# Patient Record
Sex: Female | Born: 1940
Health system: Southern US, Community
[De-identification: ages and names within clinical notes are randomized; demographics above are authoritative.]

## PROBLEM LIST (undated history)

## (undated) DIAGNOSIS — E875 Hyperkalemia: Secondary | ICD-10-CM

## (undated) DIAGNOSIS — E785 Hyperlipidemia, unspecified: Secondary | ICD-10-CM

## (undated) DIAGNOSIS — I10 Essential (primary) hypertension: Secondary | ICD-10-CM

## (undated) DIAGNOSIS — M069 Rheumatoid arthritis, unspecified: Secondary | ICD-10-CM

## (undated) DIAGNOSIS — G4700 Insomnia, unspecified: Secondary | ICD-10-CM

## (undated) HISTORY — DX: Rheumatoid arthritis, unspecified: M06.9

## (undated) HISTORY — DX: Insomnia, unspecified: G47.00

## (undated) HISTORY — DX: Hyperlipidemia, unspecified: E78.5

## (undated) HISTORY — PX: HAND TENDON SURGERY: SHX663

## (undated) HISTORY — DX: Essential (primary) hypertension: I10

## (undated) HISTORY — DX: Hyperkalemia: E87.5

---

## 1965-12-05 HISTORY — PX: BREAST CYST EXCISION: SHX579

## 1986-12-05 HISTORY — PX: OVARIAN CYST SURGERY: SHX726

## 2002-06-04 ENCOUNTER — Encounter: Admission: RE | Admit: 2002-06-04 | Discharge: 2002-09-02 | Payer: Self-pay | Admitting: Family Medicine

## 2002-10-28 ENCOUNTER — Ambulatory Visit (HOSPITAL_COMMUNITY): Admission: RE | Admit: 2002-10-28 | Discharge: 2002-10-28 | Payer: Self-pay | Admitting: Family Medicine

## 2005-01-27 ENCOUNTER — Other Ambulatory Visit: Admission: RE | Admit: 2005-01-27 | Discharge: 2005-01-27 | Payer: Self-pay | Admitting: Family Medicine

## 2005-03-24 ENCOUNTER — Ambulatory Visit (HOSPITAL_COMMUNITY): Admission: RE | Admit: 2005-03-24 | Discharge: 2005-03-24 | Payer: Self-pay | Admitting: Gastroenterology

## 2006-01-30 ENCOUNTER — Other Ambulatory Visit: Admission: RE | Admit: 2006-01-30 | Discharge: 2006-01-30 | Payer: Self-pay | Admitting: Family Medicine

## 2006-03-31 ENCOUNTER — Encounter: Admission: RE | Admit: 2006-03-31 | Discharge: 2006-05-19 | Payer: Self-pay | Admitting: Family Medicine

## 2006-04-27 ENCOUNTER — Encounter: Admission: RE | Admit: 2006-04-27 | Discharge: 2006-06-22 | Payer: Self-pay | Admitting: Family Medicine

## 2006-06-16 ENCOUNTER — Encounter: Admission: RE | Admit: 2006-06-16 | Discharge: 2006-06-16 | Payer: Self-pay | Admitting: Family Medicine

## 2007-02-13 ENCOUNTER — Other Ambulatory Visit: Admission: RE | Admit: 2007-02-13 | Discharge: 2007-02-13 | Payer: Self-pay | Admitting: Family Medicine

## 2007-02-16 ENCOUNTER — Ambulatory Visit: Payer: Self-pay | Admitting: Endocrinology

## 2007-02-21 ENCOUNTER — Ambulatory Visit: Payer: Self-pay | Admitting: Endocrinology

## 2007-03-01 ENCOUNTER — Encounter: Admission: RE | Admit: 2007-03-01 | Discharge: 2007-05-30 | Payer: Self-pay | Admitting: Family Medicine

## 2007-03-07 ENCOUNTER — Ambulatory Visit: Payer: Self-pay | Admitting: Endocrinology

## 2007-04-17 ENCOUNTER — Ambulatory Visit: Payer: Self-pay | Admitting: Endocrinology

## 2007-05-18 ENCOUNTER — Ambulatory Visit: Payer: Self-pay | Admitting: Endocrinology

## 2007-06-21 ENCOUNTER — Ambulatory Visit: Payer: Self-pay | Admitting: Endocrinology

## 2007-06-21 LAB — CONVERTED CEMR LAB: Hgb A1c MFr Bld: 6.1 % — ABNORMAL HIGH (ref 4.6–6.0)

## 2007-08-28 ENCOUNTER — Encounter: Admission: RE | Admit: 2007-08-28 | Discharge: 2007-11-14 | Payer: Self-pay | Admitting: Family Medicine

## 2007-09-19 ENCOUNTER — Encounter: Payer: Self-pay | Admitting: *Deleted

## 2007-09-19 DIAGNOSIS — G4700 Insomnia, unspecified: Secondary | ICD-10-CM | POA: Insufficient documentation

## 2007-09-19 DIAGNOSIS — J309 Allergic rhinitis, unspecified: Secondary | ICD-10-CM | POA: Insufficient documentation

## 2007-09-19 DIAGNOSIS — E785 Hyperlipidemia, unspecified: Secondary | ICD-10-CM | POA: Insufficient documentation

## 2007-09-20 ENCOUNTER — Ambulatory Visit: Payer: Self-pay | Admitting: Endocrinology

## 2007-09-20 ENCOUNTER — Encounter: Payer: Self-pay | Admitting: Endocrinology

## 2007-09-20 DIAGNOSIS — E119 Type 2 diabetes mellitus without complications: Secondary | ICD-10-CM | POA: Insufficient documentation

## 2007-09-20 LAB — CONVERTED CEMR LAB: Hgb A1c MFr Bld: 5.9 % (ref 4.6–6.0)

## 2007-10-11 ENCOUNTER — Telehealth (INDEPENDENT_AMBULATORY_CARE_PROVIDER_SITE_OTHER): Payer: Self-pay | Admitting: *Deleted

## 2007-11-08 ENCOUNTER — Telehealth (INDEPENDENT_AMBULATORY_CARE_PROVIDER_SITE_OTHER): Payer: Self-pay | Admitting: *Deleted

## 2007-12-07 ENCOUNTER — Ambulatory Visit: Payer: Self-pay | Admitting: Endocrinology

## 2007-12-08 LAB — CONVERTED CEMR LAB: Hgb A1c MFr Bld: 5.9 % (ref 4.6–6.0)

## 2007-12-31 ENCOUNTER — Encounter: Admission: RE | Admit: 2007-12-31 | Discharge: 2008-01-01 | Payer: Self-pay | Admitting: Family Medicine

## 2008-01-16 ENCOUNTER — Encounter: Payer: Self-pay | Admitting: Endocrinology

## 2008-02-28 ENCOUNTER — Other Ambulatory Visit: Admission: RE | Admit: 2008-02-28 | Discharge: 2008-02-28 | Payer: Self-pay | Admitting: Family Medicine

## 2008-02-28 ENCOUNTER — Encounter: Payer: Self-pay | Admitting: Endocrinology

## 2008-03-11 ENCOUNTER — Ambulatory Visit: Payer: Self-pay | Admitting: Endocrinology

## 2008-03-11 LAB — CONVERTED CEMR LAB: Hgb A1c MFr Bld: 6.2 % — ABNORMAL HIGH (ref 4.6–6.0)

## 2008-07-23 ENCOUNTER — Encounter: Admission: RE | Admit: 2008-07-23 | Discharge: 2008-08-26 | Payer: Self-pay | Admitting: Family Medicine

## 2008-09-09 ENCOUNTER — Ambulatory Visit: Payer: Self-pay | Admitting: Endocrinology

## 2008-09-09 LAB — CONVERTED CEMR LAB
BUN: 13 mg/dL (ref 6–23)
CO2: 34 meq/L — ABNORMAL HIGH (ref 19–32)
Calcium: 9.3 mg/dL (ref 8.4–10.5)
Chloride: 104 meq/L (ref 96–112)
Creatinine, Ser: 0.8 mg/dL (ref 0.4–1.2)
GFR calc Af Amer: 93 mL/min
GFR calc non Af Amer: 77 mL/min
Glucose, Bld: 98 mg/dL (ref 70–99)
Hgb A1c MFr Bld: 6 % (ref 4.6–6.0)
Potassium: 3.5 meq/L (ref 3.5–5.1)
Sodium: 146 meq/L — ABNORMAL HIGH (ref 135–145)

## 2008-12-15 ENCOUNTER — Ambulatory Visit: Payer: Self-pay | Admitting: Endocrinology

## 2008-12-15 LAB — CONVERTED CEMR LAB
Hgb A1c MFr Bld: 6.1 % — ABNORMAL HIGH (ref 4.6–6.0)
TSH: 1.76 microintl units/mL (ref 0.35–5.50)

## 2009-02-02 LAB — CONVERTED CEMR LAB: Pap Smear: NORMAL

## 2009-03-02 ENCOUNTER — Other Ambulatory Visit: Admission: RE | Admit: 2009-03-02 | Discharge: 2009-03-02 | Payer: Self-pay | Admitting: Family Medicine

## 2009-04-22 ENCOUNTER — Encounter: Payer: Self-pay | Admitting: Endocrinology

## 2009-05-26 ENCOUNTER — Encounter: Payer: Self-pay | Admitting: Endocrinology

## 2009-10-28 ENCOUNTER — Ambulatory Visit: Payer: Self-pay | Admitting: Endocrinology

## 2009-10-28 DIAGNOSIS — G5600 Carpal tunnel syndrome, unspecified upper limb: Secondary | ICD-10-CM | POA: Insufficient documentation

## 2009-10-28 DIAGNOSIS — R209 Unspecified disturbances of skin sensation: Secondary | ICD-10-CM | POA: Insufficient documentation

## 2009-10-28 LAB — CONVERTED CEMR LAB
Creatinine,U: 85.6 mg/dL
Folate: 13.5 ng/mL
Hgb A1c MFr Bld: 6.1 % (ref 4.6–6.5)
Microalb Creat Ratio: 2.3 mg/g (ref 0.0–30.0)
Microalb, Ur: 0.2 mg/dL (ref 0.0–1.9)
Vitamin B-12: >1500 pg/mL — ABNORMAL HIGH (ref 211–911)

## 2009-11-03 ENCOUNTER — Telehealth: Payer: Self-pay | Admitting: Endocrinology

## 2010-04-30 ENCOUNTER — Encounter: Payer: Self-pay | Admitting: Endocrinology

## 2010-06-10 ENCOUNTER — Ambulatory Visit: Payer: Self-pay | Admitting: Endocrinology

## 2010-06-24 ENCOUNTER — Ambulatory Visit: Payer: Self-pay | Admitting: Endocrinology

## 2010-06-24 LAB — CONVERTED CEMR LAB
Creatinine,U: 92.3 mg/dL
Hgb A1c MFr Bld: 6 % (ref 4.6–6.5)
Microalb Creat Ratio: 1.2 mg/g (ref 0.0–30.0)
Microalb, Ur: 1.1 mg/dL (ref 0.0–1.9)

## 2010-08-27 ENCOUNTER — Ambulatory Visit: Payer: Self-pay | Admitting: Endocrinology

## 2010-08-27 DIAGNOSIS — R609 Edema, unspecified: Secondary | ICD-10-CM | POA: Insufficient documentation

## 2010-12-30 ENCOUNTER — Other Ambulatory Visit: Payer: Self-pay | Admitting: Endocrinology

## 2010-12-30 ENCOUNTER — Ambulatory Visit
Admission: RE | Admit: 2010-12-30 | Discharge: 2010-12-30 | Payer: Self-pay | Source: Home / Self Care | Attending: Endocrinology | Admitting: Endocrinology

## 2010-12-30 LAB — HEMOGLOBIN A1C: Hgb A1c MFr Bld: 6.4 % (ref 4.6–6.5)

## 2011-01-04 ENCOUNTER — Ambulatory Visit: Payer: Self-pay | Admitting: Cardiovascular Disease

## 2011-01-04 NOTE — Assessment & Plan Note (Signed)
    Vital Signs:  Patient Profile:   70 Years Old Female Weight:      168 pounds Temp:     97 degrees F Pulse rate:   65 / minute BP sitting:   133 / 86                 Visit Type:  Follow-up Visit PCP:  Theresia Lo   History of Present Illness: patient states she is now on just the metformin and Actos.  Her glucoses have been well controlled to about 100, or sometimes lower she has a month or two of blurry vision.   Review of Systems       denies hypoglycemia   Vital Signs:  Patient Profile:   70 Years Old Female Weight:      168 pounds Temp:     97 degrees F Pulse rate:   65 / minute BP sitting:   133 / 86    Past Medical History:    Allergic rhinitis    Hyperlipidemia    Hypertension    Hx oh multiple percieved drug intolerence    Diabetes mellitus, type II     Review of Systems       denies hypoglycemia   Physical Exam  General:     healthy appearing.   Extremities:     no edema    Impression & Recommendations:  Problem # 1:  DIABETES MELLITUS, TYPE II (ICD-250.00) HgbA1C: 6.1 (06/21/2007) a1c=5.9 (10/16/8) well-controlled   Problem # 2:  blurry vision   Patient Instructions: 1)  same medications 2)  patient is advised to go back to her ophthalmologist for evaluation of her blurry vision 3)  Please schedule a follow-up appointment in 6 months.    ]

## 2011-01-04 NOTE — Letter (Signed)
Summary: Dr Davina Poke [facility]  Dr Davina Poke [facility]   Imported By: Lester [place] 05/12/2010 07:49:28  _____________________________________________________________________  External Attachment:    Type:   Image     Comment:   External Document

## 2011-01-04 NOTE — Assessment & Plan Note (Signed)
Summary: 3 mos f/u $50/cd   Vital Signs:  Patient Profile:   70 Years Old Female Weight:      159.6 pounds O2 Sat:      97 % O2 treatment:    Room Air Temp:     97.8 degrees F oral Pulse rate:   75 / minute BP sitting:   140 / 72  (right arm) Cuff size:   regular  Pt. in pain?   no  Vitals Entered By: Orlan Leavens (December 15, 2008 10:08 AM)                  PCP:  Theresia Lo  Chief Complaint:  3 MONTH FOLLOW-UP.  History of Present Illness: pt says she feels well, and she takes 2 dm meds as rx'ed. pt c/o dry skin recently.    Prior Medications Reviewed Using: Patient Recall  Prior Medication List:  ACTOS 45 MG TABS (PIOGLITAZONE HCL) Take 1 tablet by mouth once a day AMITRIPTYLINE HCL 10 MG TABS (AMITRIPTYLINE HCL) Take 1 tablet by mouth at bedtime CLONIDINE HCL 0.3 MG TABS (CLONIDINE HCL) Take 1 tablet by mouth twice a day DYAZIDE 37.5-25 MG CAPS (TRIAMTERENE-HCTZ) Take 1 capsule by mouth once a day METFORMIN HCL 1000 MG TABS (METFORMIN HCL) Take 1 tablet by mouth twice a day POTASSIUM CHLORIDE CRYS CR 20 MEQ TBCR (POTASSIUM CHLORIDE CRYS CR)  ZETIA 10 MG TABS (EZETIMIBE) 1 tablet by mouth once a day ZYRTEC HIVES RELIEF 10 MG TABS (CETIRIZINE HCL) 1 tablet by mouth at bedtime AVAPRO 150 MG  TABS (IRBESARTAN) take 1 by mouth qd   Updated Prior Medication List: ACTOS 45 MG TABS (PIOGLITAZONE HCL) Take 1 tablet by mouth once a day AMITRIPTYLINE HCL 10 MG TABS (AMITRIPTYLINE HCL) Take 1 tablet by mouth at bedtime CLONIDINE HCL 0.3 MG TABS (CLONIDINE HCL) Take 1 tablet by mouth twice a day DYAZIDE 37.5-25 MG CAPS (TRIAMTERENE-HCTZ) Take 1 capsule by mouth once a day METFORMIN HCL 1000 MG TABS (METFORMIN HCL) Take 1 tablet by mouth twice a day POTASSIUM CHLORIDE CRYS CR 20 MEQ TBCR (POTASSIUM CHLORIDE CRYS CR)  ZETIA 10 MG TABS (EZETIMIBE) 1 tablet by mouth once a day ZYRTEC HIVES RELIEF 10 MG TABS (CETIRIZINE HCL) 1 tablet by mouth at bedtime AVAPRO 150 MG  TABS  (IRBESARTAN) take 1 by mouth qd  Current Allergies (reviewed today): ! JANUVIA (SITAGLIPTIN PHOSPHATE) LIPITOR (ATORVASTATIN CALCIUM) MORPHINE SULFATE (MORPHINE SULFATE) TYLENOL (ACETAMINOPHEN) ALEVE (NAPROXEN SODIUM) ASPIRIN (ASPIRIN)  Past Medical History:    Reviewed history from 09/09/2008 and no changes required:       Allergic rhinitis       Hyperlipidemia       Hypertension       Hx of multiple percieved drug intolerence       Diabetes mellitus, type II     Review of Systems       pt has lost a few lbs, due to her efforts   Physical Exam  General:     well developed, well nourished, in no acute distress Psych:     alert and cooperative; normal mood and affect; normal attention span and concentration Additional Exam:     A1C%                 [H]  6.1 %                       4.6-6.0 FastTSH  1.76 uIU/mL     Impression & Recommendations:  Problem # 1:  DIABETES MELLITUS, TYPE II (ICD-250.00) well-controlled    Patient Instructions: 1)  same meds 2)  ret 6 mos

## 2011-01-04 NOTE — Assessment & Plan Note (Signed)
Summary: f/u appt per pt/cd   Vital Signs:  Patient profile:   70 year old female Height:      64 inches Weight:      172 pounds BMI:     29.63 O2 Sat:      97 % on Room air Temp:     97.6 degrees F oral Pulse rate:   71 / minute BP sitting:   120 / 74  (left arm) Cuff size:   regular  O2 Flow:  Room air CC: Endo f/u DBD   Primary Provider:  [name]  CC:  Endo f/u DBD.  History of Present Illness: pt states 1 year of mild numbness of both hands.  no associated pain. no cbg record, but states cbg's have increased to mid-100's.  it is below 100 in the afternoon.    Allergies: 1)  ! Januvia (Sitagliptin Phosphate) 2)  Lipitor (Atorvastatin Calcium) 3)  Morphine Sulfate (Morphine Sulfate) 4)  Tylenol (Acetaminophen) 5)  Aleve (Naproxen Sodium) 6)  Aspirin (Aspirin)  Past History:  Past Medical History: Last updated: 09/09/2008 Allergic rhinitis Hyperlipidemia Hypertension Hx of multiple percieved drug intolerence Diabetes mellitus, type II  Past Surgical History: right cataract 2010  Review of Systems       The patient complains of weight gain.  The patient denies chest pain.    Physical Exam  General:  normal appearance.   Neck:  Supple without thyroid enlargement or tenderness. No cervical lymphadenopathy or neck masses Extremities:  hands are normal Neurologic:  sensation is intact to touch on the hands Additional Exam:  Hemoglobin A1C            6.1 %   Impression & Recommendations:  Problem # 1:  DIABETES MELLITUS, TYPE II (ICD-250.00) well-controlled  Problem # 2:  NUMBNESS (ICD-782.0) prob due to #3  Problem # 3:  CARPAL TUNNEL SYNDROME, BILATERAL (ICD-354.0) Assessment: New  Medications Added to Medication List This Visit: 1)  Bilateral Wrist Splints  .... As dir  Other Orders: TLB-B12 + Folate Pnl ([id number]-B12/FOL) TLB-A1C / Hgb A1C (Glycohemoglobin) (83036-A1C) TLB-Microalbumin/Creat Ratio, Urine (82043-MALB) Est. Patient Level  IV (81191)  Patient Instructions: 1)  cc dr Elease Hashimoto 2)  tests are being ordered for you today.  a few days after the test(s), please call [phone] to hear your test results. 3)  i gave prescription for wrist splints 4)  return 6 mos 5)  (update: i left message on phone-tree:  rx as we discussed.  same medications) Prescriptions: BILATERAL WRIST SPLINTS as dir  #1 pair x 0   Entered and Authorized by:   Minus Breeding MD   Signed by:   Minus Breeding MD on 10/28/2009   Method used:   Print then Give to Patient   RxID:   [id number]    Immunization History:  Tetanus/Td Immunization History:    Tetanus/Td:  td (12/06/2003)  Not Administered:    Pneumonia Vaccine not given due to: declined    Influenza Vaccine not given due to: declined

## 2011-01-04 NOTE — Assessment & Plan Note (Signed)
Summary: swollen legs/offered sda,req this appt/cd   Vital Signs:  Patient profile:   70 year old female Height:      64 inches (162.56 cm) Weight:      179.50 pounds (81.59 kg) BMI:     30.92 O2 Sat:      98 % on Room air Temp:     97.6 degrees F (36.44 degrees C) oral Pulse rate:   68 / minute BP sitting:   108 / 64  (left arm) Cuff size:   large  Vitals Entered By: Brenton Grills MA (August 27, 2010 9:20 AM)  O2 Flow:  Room air CC: swollen legs/aj Is Patient Diabetic? Yes   Primary Provider:  l miller  CC:  swollen legs/aj.  History of Present Illness: pt states 10 days of moderate swelling of the legs, and slight assoc pain.   Current Medications (verified): 1)  Actos 45 Mg Tabs (Pioglitazone Hcl) .... Take 1 Tablet By Mouth Once A Day 2)  Amitriptyline Hcl 10 Mg Tabs (Amitriptyline Hcl) .... Take 1 Tablet By Mouth At Bedtime 3)  Clonidine Hcl 0.3 Mg Tabs (Clonidine Hcl) .... Take 1 Tablet By Mouth Twice A Day 4)  Dyazide 37.5-25 Mg Caps (Triamterene-Hctz) .... Take 1 Capsule By Mouth Once A Day 5)  Metformin Hcl 1000 Mg Tabs (Metformin Hcl) .... Take 1 Tablet By Mouth Twice A Day 6)  Potassium Chloride Crys Cr 20 Meq Tbcr (Potassium Chloride Crys Cr) 7)  Zetia 10 Mg Tabs (Ezetimibe) .[name].. 1 Tablet By Mouth Once A Day 8)  Zyrtec Hives Relief 10 Mg Tabs (Cetirizine Hcl) .[name].. 1 Tablet By Mouth At Bedtime 9)  Avapro 150 Mg  Tabs (Irbesartan) .... Take 1 By Mouth Qd 10)  Bilateral Wrist Splints .... As Dir 11)  Fexofenadine Hcl 180 Mg Tabs (Fexofenadine Hcl) .[name].. 1 Tablet Daily 12)  Meloxicam 15 Mg Tabs (Meloxicam) .[name].. 1 Tablet Once Daily  Allergies (verified): 1)  ! Januvia (Sitagliptin Phosphate) 2)  Lipitor (Atorvastatin Calcium) 3)  Morphine Sulfate (Morphine Sulfate) 4)  Tylenol (Acetaminophen) 5)  Aleve (Naproxen Sodium) 6)  Aspirin (Aspirin)  Past History:  Past Medical History: Last updated: 09/09/2008 Allergic  rhinitis Hyperlipidemia Hypertension Hx of multiple percieved drug intolerence Diabetes mellitus, type II  Review of Systems  The patient denies chest pain and dyspnea on exertion.    Physical Exam  General:  obese.  no distress  Extremities:  no deformity.  no ulcer on the feet.  feet are of normal color and temp.   there is a "corn" at the extensor surface of the right 2nd toe. 1+ right pedal edema and 1+ left pedal edema.     Impression & Recommendations:  Problem # 1:  EDEMA (ICD-782.3) Assessment New prob due to actos  Medications Added to Medication List This Visit: 1)  Actos 15 Mg Tabs (Pioglitazone hcl) .[name].. 1 once daily  Other Orders: Est. Patient Level III (95284)  Patient Instructions: 1)  stop actos 2)  in 1 month, resume at 15 mg once daily (or, to use-up your current 45 mg, take 1 every 3 days). 3)  Please schedule a follow-up appointment in 4 months.

## 2011-01-04 NOTE — Assessment & Plan Note (Signed)
Summary: follow up-lb   Vital Signs:  Patient profile:   70 year old female Height:      64 inches (162.56 cm) Weight:      171.25 pounds (77.84 kg) BMI:     29.50 O2 Sat:      97 % on Room air Temp:     97.7 degrees F (36.50 degrees C) oral Pulse rate:   63 / minute BP sitting:   142 / 92  (left arm) Cuff size:   regular  Vitals Entered By: Brenton Grills MA (June 24, 2010 10:40 AM)  O2 Flow:  Room air CC: F/U appt/aj Is Patient Diabetic? Yes Comments Pt's Ob/Gyn is Valorie Roosevelt at [place] and her mammograms are done at [facility]   Primary Provider:  l miller  CC:  F/U appt/aj.  History of Present Illness: pt states she feels well in general, except for right hand pain, for which she sees orthopedics.  no cbg record, but states cbg's are well-controlled.     Current Medications (verified): 1)  Actos 45 Mg Tabs (Pioglitazone Hcl) .... Take 1 Tablet By Mouth Once A Day 2)  Amitriptyline Hcl 10 Mg Tabs (Amitriptyline Hcl) .... Take 1 Tablet By Mouth At Bedtime 3)  Clonidine Hcl 0.3 Mg Tabs (Clonidine Hcl) .... Take 1 Tablet By Mouth Twice A Day 4)  Dyazide 37.5-25 Mg Caps (Triamterene-Hctz) .... Take 1 Capsule By Mouth Once A Day 5)  Metformin Hcl 1000 Mg Tabs (Metformin Hcl) .... Take 1 Tablet By Mouth Twice A Day 6)  Potassium Chloride Crys Cr 20 Meq Tbcr (Potassium Chloride Crys Cr) 7)  Zetia 10 Mg Tabs (Ezetimibe) .[name].. 1 Tablet By Mouth Once A Day 8)  Zyrtec Hives Relief 10 Mg Tabs (Cetirizine Hcl) .[name].. 1 Tablet By Mouth At Bedtime 9)  Avapro 150 Mg  Tabs (Irbesartan) .... Take 1 By Mouth Qd 10)  Bilateral Wrist Splints .... As Dir 11)  Fexofenadine Hcl 180 Mg Tabs (Fexofenadine Hcl) .[name].. 1 Tablet Daily 12)  Meloxicam 15 Mg Tabs (Meloxicam) .[name].. 1 Tablet Once Daily  Allergies (verified): 1)  ! Januvia (Sitagliptin Phosphate) 2)  Lipitor (Atorvastatin Calcium) 3)  Morphine Sulfate (Morphine Sulfate) 4)  Tylenol (Acetaminophen) 5)  Aleve (Naproxen Sodium) 6)  Aspirin  (Aspirin)  Past History:  Past Medical History: Last updated: 09/09/2008 Allergic rhinitis Hyperlipidemia Hypertension Hx of multiple percieved drug intolerence Diabetes mellitus, type II  Review of Systems       The patient complains of weight gain.    Physical Exam  General:  obese.  no distress  Pulses:  dorsalis pedis intact bilat.   Extremities:  no deformity.  no ulcer on the feet.  feet are of normal color and temp.  no edema there is a "corn" at the extensor surface of the right 2nd toe. Neurologic:  sensation is intact to touch on the feet  Additional Exam:  Hemoglobin A1C            6.0 %     Impression & Recommendations:  Problem # 1:  DIABETES MELLITUS, TYPE II (ICD-250.00) well-controlled  Medications Added to Medication List This Visit: 1)  Fexofenadine Hcl 180 Mg Tabs (Fexofenadine hcl) .[name].. 1 tablet daily 2)  Meloxicam 15 Mg Tabs (Meloxicam) .[name].. 1 tablet once daily  Other Orders: TLB-A1C / Hgb A1C (Glycohemoglobin) (83036-A1C) TLB-Microalbumin/Creat Ratio, Urine (82043-MALB) Est. Patient Level III (84132)  Patient Instructions: 1)  blood tests are being ordered for you today.  please call  [phone] to hear your test results. 2)  pending the test results, please continue the same medications for now. 3)  Please schedule a follow-up appointment in 1 year. 4)  good diet and exercise habits significanly improve the control of your diabetes.  please let me know if you wish to be referred to a dietician.  high blood sugar is very risky to your health.  you should see an eye doctor every year. 5)  controlling your blood pressure and cholesterol drastically reduces the damage diabetes does to your body.  this also applies to quitting smoking.  please discuss these with your doctor.  you should take an aspirin every day, unless you have been advised by a doctor not to. 6)  (update: i left message on phone-tree:  rx as we discussed) Prescriptions: METFORMIN HCL  1000 MG TABS (METFORMIN HCL) Take 1 tablet by mouth twice a day  #60 x 11   Entered and Authorized by:   Minus Breeding MD   Signed by:   Minus Breeding MD on 06/24/2010   Method used:   Electronically to        [facility] # [phone]* (retail)       [address] [place], [place]  [address]       Ph: [phone]       Fax: [phone]   RxID:   [id number] ACTOS 45 MG TABS (PIOGLITAZONE HCL) Take 1 tablet by mouth once a day  #30 x 11   Entered and Authorized by:   Minus Breeding MD   Signed by:   Minus Breeding MD on 06/24/2010   Method used:   Electronically to        [facility] # [phone]* (retail)       [address] [place], [place]  [address]       Ph: [phone]       Fax: [phone]   RxID:   [id number]    Preventive Care Screening  Mammogram:    Date:  03/30/2010    Results:  normal   Pap Smear:    Date:  02/02/2009    Results:  normal   Bone Density:    Date:  12/05/2002    Results:  normal std dev  Colonoscopy:    Date:  12/05/2002    Results:  normal

## 2011-01-04 NOTE — Letter (Signed)
Summary: [facility]-Dr[facility] London Sheer  [facility]   Imported By: Maryln Gottron 05/07/2009 11:01:15  _____________________________________________________________________  External Attachment:    Type:   Image     Comment:   External Document

## 2011-01-04 NOTE — Assessment & Plan Note (Signed)
Summary: 6 MO ROA/NML   Vital Signs:  Patient Profile:   70 Years Old Female Weight:      160.4 pounds Temp:     97.8 degrees F oral Pulse rate:   60 / minute BP sitting:   142 / 88  (right arm) Cuff size:   regular  Pt. in pain?   no  Vitals Entered By: Orlan Leavens (September 09, 2008 10:18 AM)                  PCP:  Theresia Lo  Chief Complaint:  6 month follow-up.  History of Present Illness: pt says cbg's in am have increased to mid-100's. she feels well.    Current Allergies: ! JANUVIA (SITAGLIPTIN PHOSPHATE) LIPITOR (ATORVASTATIN CALCIUM) MORPHINE SULFATE (MORPHINE SULFATE) TYLENOL (ACETAMINOPHEN) ALEVE (NAPROXEN SODIUM) ASPIRIN (ASPIRIN)  Past Medical History:    Reviewed history from 09/20/2007 and no changes required:       Allergic rhinitis       Hyperlipidemia       Hypertension       Hx of multiple percieved drug intolerence       Diabetes mellitus, type II     Review of Systems  The patient denies weight loss and weight gain.     Physical Exam  General:     well developed, well nourished, in no acute distress Pulses:     dorsalis pedis intact bilat.   Extremities:     no deformity.  no ulcer on the feet.  feet are of normal color and temp.  no edema there are large bilat bunions Neurologic:     sensation is intact to touch on the feet  Additional Exam:     SODIUM               [H]  146 mEq/L                   135-145   POTASSIUM                 3.5 mEq/L                   3.5-5.1   CHLORIDE                  104 mEq/L                   96-112   CARBON DIOXIDE       [H]  34 mEq/L                    19-32   GLUCOSE                   98 mg/dL                    16-10   BUN                       13 mg/dL                    9-60   CREATININE                0.8 mg/dL                   4.5-4.0   CALCIUM  9.3 mg/dL                   1.6-10.9   A1C%                      6.0 %    Impression & Recommendations:  Problem # 1:   DIABETES MELLITUS, TYPE II (ICD-250.00) well-controlled   Patient Instructions: 1)  same rx for dm 2)  ret 3 mos   ]

## 2011-01-04 NOTE — Progress Notes (Signed)
  Phone Note Call from Patient Call back at Home Phone [phone]   Caller: Patient/[phone] Call For: dr Teresita Madura 90  Summary of Call: since been off the amaryl having high reading . per dr Everardo All took her off the amaryl  7-18- 08 what to get back on the med . requesting a written rx for 90 days  Initial call taken by: Shelbie Proctor,  November 08, 2007 4:07 PM  Follow-up for Phone Call        need chart Follow-up by: Corwin Levins MD,  November 08, 2007 5:42 PM  Additional Follow-up for Phone Call Additional follow up Details #1::        not advisable with this amount of information - last A!1c was near normal july 2008 - suggest ROV with dr Everardo All to review Additional Follow-up by: Corwin Levins MD,  November 09, 2007 1:47 PM    Additional Follow-up for Phone Call Additional follow up Details #2::    spoke with pt made aware will make appt with dr Everardo All  Follow-up by: Shelbie Proctor,  November 09, 2007 1:55 PM

## 2011-01-04 NOTE — Assessment & Plan Note (Signed)
Summary: PER 11/08/07 PHONE NOTE-DATE PER PT-STC   Vital Signs:  Patient Profile:   70 Years Old Female Weight:      165.2 pounds Temp:     97.1 degrees F oral Pulse rate:   74 / minute BP sitting:   133 / 80  (right arm) Cuff size:   regular  Vitals Entered By: Orlan Leavens (December 07, 2007 8:07 AM)                 Visit Type:  Follow-up Visit PCP:  Theresia Lo  Chief Complaint:  dm.  History of Present Illness: patient states her morning glucoses, before breakfast, had increased to the mid 100s.  she feels well in general.  she thinks her control has deteriorated.  Current Allergies: LIPITOR (ATORVASTATIN CALCIUM) MORPHINE SULFATE (MORPHINE SULFATE) TYLENOL (ACETAMINOPHEN) ALEVE (NAPROXEN SODIUM) ASPIRIN (ASPIRIN)  Past Medical History:    Reviewed history from 09/20/2007 and no changes required:       Allergic rhinitis       Hyperlipidemia       Hypertension       Hx oh multiple percieved drug intolerence       Diabetes mellitus, type II     Review of Systems  The patient denies unusual weight change.     Physical Exam  General:     well developed, well nourished, in no acute distress Pulses:     dorsalis pedis intact bilat.   Extremities:     no clubbing, cyanosis, edema, or deformity noted with normal full range of motion of all joints.  she has onychomycosis and few callouses Neurologic:     sensation is intact to touch on the feet Additional Exam:     a1c=5.9    Impression & Recommendations:  Problem # 1:  DIABETES MELLITUS, TYPE II (ICD-250.00) well-controlled The following medications were removed from the medication list:    Amaryl 1 Mg Tabs (Glimepiride) .[name]... Take 1 tablet by mouth every morning    Glimepiride 4 Mg Tabs (Glimepiride) .[name]... Take 2 tablet by mouth once a day    Januvia 100 Mg Tabs (Sitagliptin phosphate) .[name]... Take 1 tablet by mouth once a day  Her updated medication list for this problem includes:    Actos 45 Mg Tabs  (Pioglitazone hcl) .[name]... Take 1 tablet by mouth once a day    Glucophage Xr 500 Mg Tb24 (Metformin hcl) .[name]... Take 4 tablet by mouth every morning    Metformin Hcl 1000 Mg Tabs (Metformin hcl) .[name]... Take 1 tablet by mouth twice a day  Orders: TLB-A1C / Hgb A1C (Glycohemoglobin) (83036-A1C) Est. Patient Level III (25956)   Medications Added to Medication List This Visit: 1)  Avapro 150 Mg Tabs (Irbesartan) .... Take 1 by mouth qd   Patient Instructions: 1)  same actos and metformin 2)  Please schedule a follow-up appointment in 6 months.    ]

## 2011-01-04 NOTE — Progress Notes (Signed)
Summary: lab result  Phone Note Call from Patient   Caller: Patient [phone] Summary of Call: pt called to get results of lab work and to request copy of labs be sent to Dr. Harvie Bridge office.  Initial call taken by: Margaret Pyle, CMA,  November 03, 2009 3:20 PM  Follow-up for Phone Call        i left message on phone-tree and faxed copy to dr nahser Follow-up by: Minus Breeding MD,  November 03, 2009 3:41 PM  Additional Follow-up for Phone Call Additional follow up Details #1::        pt informed Additional Follow-up by: Margaret Pyle, CMA,  November 03, 2009 3:45 PM     Appended Document: lab result pt called stating she had appt with Dr. Elease Hashimoto today. Doctor has copy od OV but not of last labs. I manually faxed labs and confirm receipt with Verlon Au at [facility].  Appended Document: lab result pt called again stating that Dr. Sallee Provencal office did not recevie fax. pt gave alt fax number [phone] to which I re-sent copy of pt labs.

## 2011-01-04 NOTE — Assessment & Plan Note (Signed)
Summary: 6 MTH FU-STC   Vital Signs:  Patient Profile:   70 Years Old Female Weight:      158.8 pounds Temp:     97.9 degrees F oral Pulse rate:   59 / minute BP sitting:   116 / 75  (right arm) Cuff size:   regular  Vitals Entered By: Orlan Leavens (March 11, 2008 10:27 AM)                 Visit Type:  Follow-up Visit PCP:  Theresia Lo  Chief Complaint:  dm.  History of Present Illness: pt states cbg's range 78-115.  feels well in general, except for pain left great toe.     Current Allergies: LIPITOR (ATORVASTATIN CALCIUM) MORPHINE SULFATE (MORPHINE SULFATE) TYLENOL (ACETAMINOPHEN) ALEVE (NAPROXEN SODIUM) ASPIRIN (ASPIRIN)  Past Medical History:    Reviewed history from 09/20/2007 and no changes required:       Allergic rhinitis       Hyperlipidemia       Hypertension       Hx oh multiple percieved drug intolerence       Diabetes mellitus, type II     Review of Systems  The patient denies unusual weight change.     Physical Exam  General:     normal appearance.   Extremities:     no edema left toe normal except for heavy callous Additional Exam:     a1c=6.2    Impression & Recommendations:  Problem # 1:  DIABETES MELLITUS, TYPE II (ICD-250.00)  Her updated medication list for this problem includes:    Actos 45 Mg Tabs (Pioglitazone hcl) .[name]... Take 1 tablet by mouth once a day    Glucophage Xr 500 Mg Tb24 (Metformin hcl) .[name]... Take 4 tablet by mouth every morning    Metformin Hcl 1000 Mg Tabs (Metformin hcl) .[name]... Take 1 tablet by mouth twice a day  Orders: TLB-A1C / Hgb A1C (Glycohemoglobin) (83036-A1C) Est. Patient Level III (16109)   Problem # 2:  foot pain   Patient Instructions: 1)  i offered ref podiatry--will consider 2)  same meds 3)  ret 6 mos 4)  cc dr Theresia Lo    ]  Appended Document: 6 MTH FU-STC FAXED NOTES TO DR Theresia Lo @ [phone]/LMB

## 2011-01-04 NOTE — Progress Notes (Signed)
Summary: question about med  Medications Added ACTOS 45 MG TABS (PIOGLITAZONE HCL) Take 1 tablet by mouth once a day AMARYL 1 MG TABS (GLIMEPIRIDE) Take 1 tablet by mouth every morning AMITRIPTYLINE HCL 10 MG TABS (AMITRIPTYLINE HCL) Take 1 tablet by mouth at bedtime CLONIDINE HCL 0.3 MG TABS (CLONIDINE HCL) Take 1 tablet by mouth twice a day DYAZIDE 37.5-25 MG CAPS (TRIAMTERENE-HCTZ) Take 1 capsule by mouth once a day GLIMEPIRIDE 4 MG TABS (GLIMEPIRIDE) Take 2 tablet by mouth once a day GLUCOPHAGE XR 500 MG TB24 (METFORMIN HCL) Take 4 tablet by mouth every morning JANUVIA 100 MG TABS (SITAGLIPTIN PHOSPHATE) Take 1 tablet by mouth once a day LISINOPRIL 20 MG TABS (LISINOPRIL) 1/2 tablet by mouth once a day METFORMIN HCL 1000 MG TABS (METFORMIN HCL) Take 1 tablet by mouth twice a day POTASSIUM CHLORIDE CRYS CR 20 MEQ TBCR (POTASSIUM CHLORIDE CRYS CR)  ZETIA 10 MG TABS (EZETIMIBE) 1 tablet by mouth once a day ZYRTEC HIVES RELIEF 10 MG TABS (CETIRIZINE HCL) 1 tablet by mouth at bedtime      Allergies Added: LIPITOR (ATORVASTATIN CALCIUM) MORPHINE SULFATE (MORPHINE SULFATE) TYLENOL (ACETAMINOPHEN) ALEVE (NAPROXEN SODIUM) ASPIRIN (ASPIRIN) Phone Note Call from Patient Call back at Home Phone [phone] Call back at [phone]/cell   Caller: Patient Call For: dr ellision Summary of Call: was told by dr  Jacquenette Shone to stop the [place] is this what dr Everardo All want her to do this.Patient's chart has been requested.  Initial call taken by: Shelbie Proctor,  October 11, 2007 3:49 PM  Follow-up for Phone Call        stay off januvia continue actos and metformin Follow-up by: Minus Breeding MD,  October 11, 2007 5:09 PM  Additional Follow-up for Phone Call Additional follow up Details #1::        Phone Call Completed pt made aware spoke with pt requarding dr Everardo All msg  Additional Follow-up by: Shelbie Proctor,  October 12, 2007 9:56 AM   New Allergies: LIPITOR (ATORVASTATIN  CALCIUM) MORPHINE SULFATE (MORPHINE SULFATE) TYLENOL (ACETAMINOPHEN) ALEVE (NAPROXEN SODIUM) ASPIRIN (ASPIRIN) New/Updated Medications: ACTOS 45 MG TABS (PIOGLITAZONE HCL) Take 1 tablet by mouth once a day AMARYL 1 MG TABS (GLIMEPIRIDE) Take 1 tablet by mouth every morning AMITRIPTYLINE HCL 10 MG TABS (AMITRIPTYLINE HCL) Take 1 tablet by mouth at bedtime CLONIDINE HCL 0.3 MG TABS (CLONIDINE HCL) Take 1 tablet by mouth twice a day DYAZIDE 37.5-25 MG CAPS (TRIAMTERENE-HCTZ) Take 1 capsule by mouth once a day GLIMEPIRIDE 4 MG TABS (GLIMEPIRIDE) Take 2 tablet by mouth once a day GLUCOPHAGE XR 500 MG TB24 (METFORMIN HCL) Take 4 tablet by mouth every morning JANUVIA 100 MG TABS (SITAGLIPTIN PHOSPHATE) Take 1 tablet by mouth once a day LISINOPRIL 20 MG TABS (LISINOPRIL) 1/2 tablet by mouth once a day METFORMIN HCL 1000 MG TABS (METFORMIN HCL) Take 1 tablet by mouth twice a day POTASSIUM CHLORIDE CRYS CR 20 MEQ TBCR (POTASSIUM CHLORIDE CRYS CR)  ZETIA 10 MG TABS (EZETIMIBE) 1 tablet by mouth once a day ZYRTEC HIVES RELIEF 10 MG TABS (CETIRIZINE HCL) 1 tablet by mouth at bedtime New Allergies: LIPITOR (ATORVASTATIN CALCIUM) MORPHINE SULFATE (MORPHINE SULFATE) TYLENOL (ACETAMINOPHEN) ALEVE (NAPROXEN SODIUM) ASPIRIN (ASPIRIN)

## 2011-01-12 NOTE — Assessment & Plan Note (Signed)
Summary: 4 MTH FU---STC   Vital Signs:  Patient profile:   70 year old female Height:      64 inches (162.56 cm) Weight:      173.75 pounds (78.98 kg) BMI:     29.93 O2 Sat:      99 % on Room air Temp:     98.4 degrees F (36.89 degrees C) oral Pulse rate:   71 / minute Pulse rhythm:   regular BP sitting:   138 / 80  (left arm) Cuff size:   large  Vitals Entered By: Brenton Grills CMA Duncan Dull) (December 30, 2010 11:32 AM)  O2 Flow:  Room air CC: 4 month F/U/pt declined flu shot/aj Is Patient Diabetic? Yes Comments Pt is due for mammogram and pap (next Month) and has never had pneumovax or Zostavax   Primary Provider:  l miller  CC:  4 month F/U/pt declined flu shot/aj.  History of Present Illness: no cbg record, but states cbg's are almost always well-controlled.  pt states she feels well in general.    Current Medications (verified): 1)  Amitriptyline Hcl 10 Mg Tabs (Amitriptyline Hcl) .... Take 1 Tablet By Mouth At Bedtime 2)  Clonidine Hcl 0.3 Mg Tabs (Clonidine Hcl) .... Take 1 Tablet By Mouth Twice A Day 3)  Dyazide 37.5-25 Mg Caps (Triamterene-Hctz) .... Take 1 Capsule By Mouth Once A Day 4)  Metformin Hcl 1000 Mg Tabs (Metformin Hcl) .... Take 1 Tablet By Mouth Twice A Day 5)  Potassium Chloride Crys Cr 20 Meq Tbcr (Potassium Chloride Crys Cr) 6)  Zetia 10 Mg Tabs (Ezetimibe) .[name].. 1 Tablet By Mouth Once A Day 7)  Zyrtec Hives Relief 10 Mg Tabs (Cetirizine Hcl) .[name].. 1 Tablet By Mouth At Bedtime 8)  Avapro 150 Mg  Tabs (Irbesartan) .... Take 1 By Mouth Qd 9)  Bilateral Wrist Splints .... As Dir 10)  Fexofenadine Hcl 180 Mg Tabs (Fexofenadine Hcl) .[name].. 1 Tablet By Mouth As Needed 11)  Meloxicam 15 Mg Tabs (Meloxicam) .[name].. 1 Tablet Once Daily 12)  Actos 15 Mg Tabs (Pioglitazone Hcl) .[name].. 1 Once Daily  Allergies (verified): 1)  ! Januvia (Sitagliptin Phosphate) 2)  Lipitor (Atorvastatin Calcium) 3)  Morphine Sulfate (Morphine Sulfate) 4)  Tylenol (Acetaminophen) 5)   Aleve (Naproxen Sodium) 6)  Aspirin (Aspirin)  Past History:  Past Medical History: Last updated: 09/09/2008 Allergic rhinitis Hyperlipidemia Hypertension Hx of multiple percieved drug intolerence Diabetes mellitus, type II  Review of Systems  The patient denies hypoglycemia.    Physical Exam  General:  obese.  no distress  Pulses:  dorsalis pedis intact bilat.   Extremities:  no deformity.  no ulcer on the feet.  feet are of normal color and temp.   there is a "corn" at the extensor surface of the right 2nd toe. 1+ right pedal edema and 1+ left pedal edema.   Neurologic:  sensation is intact to touch on the feet  Additional Exam:  Hemoglobin A1C            6.4 %      Impression & Recommendations:  Problem # 1:  DIABETES MELLITUS, TYPE II (ICD-250.00) well-controlled  Medications Added to Medication List This Visit: 1)  Fexofenadine Hcl 180 Mg Tabs (Fexofenadine hcl) .[name].. 1 tablet by mouth as needed  Other Orders: TLB-A1C / Hgb A1C (Glycohemoglobin) (83036-A1C) Est. Patient Level III (40981)  Patient Instructions: 1)  blood tests are being ordered for you today.  please call [phone] to hear your  test results. 2)  Please schedule a follow-up appointment in 6 months. 3)  (update: i left message on phone-tree:  rx as we discussed)   Orders Added: 1)  TLB-A1C / Hgb A1C (Glycohemoglobin) [83036-A1C] 2)  Est. Patient Level III [04540]

## 2011-03-03 ENCOUNTER — Other Ambulatory Visit: Payer: Self-pay | Admitting: Endocrinology

## 2011-03-28 ENCOUNTER — Other Ambulatory Visit: Payer: Self-pay | Admitting: *Deleted

## 2011-03-28 DIAGNOSIS — Z79899 Other long term (current) drug therapy: Secondary | ICD-10-CM

## 2011-03-30 ENCOUNTER — Encounter: Payer: Self-pay | Admitting: Cardiovascular Disease

## 2011-03-30 DIAGNOSIS — M069 Rheumatoid arthritis, unspecified: Secondary | ICD-10-CM | POA: Insufficient documentation

## 2011-03-30 DIAGNOSIS — I10 Essential (primary) hypertension: Secondary | ICD-10-CM | POA: Insufficient documentation

## 2011-03-30 DIAGNOSIS — E876 Hypokalemia: Secondary | ICD-10-CM | POA: Insufficient documentation

## 2011-04-06 ENCOUNTER — Encounter: Payer: Self-pay | Admitting: Cardiovascular Disease

## 2011-04-06 ENCOUNTER — Ambulatory Visit (INDEPENDENT_AMBULATORY_CARE_PROVIDER_SITE_OTHER): Payer: BC Managed Care – HMO | Admitting: Cardiovascular Disease

## 2011-04-06 ENCOUNTER — Other Ambulatory Visit (INDEPENDENT_AMBULATORY_CARE_PROVIDER_SITE_OTHER): Payer: BC Managed Care – HMO | Admitting: *Deleted

## 2011-04-06 VITALS — BP 138/76 | HR 72 | Wt 169.0 lb

## 2011-04-06 DIAGNOSIS — I10 Essential (primary) hypertension: Secondary | ICD-10-CM

## 2011-04-06 DIAGNOSIS — Z79899 Other long term (current) drug therapy: Secondary | ICD-10-CM

## 2011-04-06 LAB — BASIC METABOLIC PANEL
BUN: 19 mg/dL (ref 6–23)
CO2: 30 mEq/L (ref 19–32)
Calcium: 9.2 mg/dL (ref 8.4–10.5)
Chloride: 103 mEq/L (ref 96–112)
Creatinine, Ser: 0.8 mg/dL (ref 0.4–1.2)
GFR: 91.78 mL/min (ref >60.00)
Glucose, Bld: 97 mg/dL (ref 70–99)
Potassium: 3.4 mEq/L — ABNORMAL LOW (ref 3.5–5.1)
Sodium: 141 mEq/L (ref 135–145)

## 2011-04-06 NOTE — Assessment & Plan Note (Signed)
April [name] is doing very well from a cardiac standpoint. Her blood pressure is well controlled. We've drawn a basic metabolic profile today and results of which are pending. I'll see her again in 6 months for office visit and basic metabolic profile.

## 2011-04-06 NOTE — Progress Notes (Signed)
April [name] Date of Birth  Mar 14, 1941 [facility] [address]     [address] [place], [place]  [address] [phone]  Fax  [phone]  History of Present Illness: April [name] presents today for followup of her hypertension. She also has a history of Hypokalemia as well as rheumatoid arthritis.She denies any cardiac symptoms.  C/o arthritis pain.  Needs to have surgery on R thumb.  Sees Dr. Amanda Pea. No cp or dspnea.    Current Outpatient Prescriptions on File Prior to Visit  Medication Sig Dispense Refill  . ACTOS 15 MG tablet TAKE 1 TABLET BY MOUTH ONCE A DAY  90 tablet  2  . amitriptyline (ELAVIL) 10 MG tablet Take 10 mg by mouth at bedtime.        . cetirizine (ZYRTEC) 10 MG tablet Take 10 mg by mouth daily.        . cloNIDine (CATAPRES) 0.3 MG tablet Take 0.3 mg by mouth 2 (two) times daily.        [name] ezetimibe (ZETIA) 10 MG tablet Take 10 mg by mouth daily.        . fexofenadine (ALLEGRA) 180 MG tablet Take 180 mg by mouth as needed.        . fluticasone (FLONASE) 50 MCG/ACT nasal spray 2 sprays by Nasal route as needed.        . irbesartan (AVAPRO) 300 MG tablet Take 300 mg by mouth at bedtime.        . metFORMIN (GLUCOPHAGE) 500 MG tablet Take 500 mg by mouth daily with breakfast.        . potassium chloride SA (K-DUR,KLOR-CON) 20 MEQ tablet Take 20 mEq by mouth 2 (two) times daily.        . pravastatin (PRAVACHOL) 80 MG tablet Take 80 mg by mouth daily.        [name] triamterene-hydrochlorothiazide (DYAZIDE) 37.5-25 MG per capsule Take 1 capsule by mouth every morning.        . metaxalone (SKELAXIN) 800 MG tablet Take 800 mg by mouth as needed.          Allergies  Allergen Reactions  . Acetaminophen     REACTION: unspecified  . Aspirin     REACTION: unspecified  . Atorvastatin     REACTION: unspecified  . Morphine Sulfate     REACTION: unspecified  . Naproxen Sodium     REACTION: unspecified  . Sitagliptin Phosphate    REACTION: nausea    Past Medical History  Diagnosis Date  . Hypertension   . Diabetes mellitus     TYPE 2  . Hyperkalemia   . RA (rheumatoid arthritis)     No past surgical history on file.  History  Smoking status  . Never Smoker   Smokeless tobacco  . Not on file    History  Alcohol Use No    No family history on file.  Reviw of Systems:  Reviewed in the HPI.  All other systems are negative.  Physical Exam: BP 138/76  Pulse 72  Wt 169 lb (76.658 kg) The patient is alert and oriented x 3.  The mood and affect are normal.  The skin is warm and dry.  Color is normal.  The HEENT exam reveals that the sclera are nonicteric.  The mucous membranes are moist.  The carotids are 2+ without bruits.  There is no thyromegaly.  There is no JVD.  The lungs are clear.  The chest wall  is non tender.  The heart exam reveals a regular rate with a normal S1 and S2.  There are no murmurs, gallops, or rubs.  The PMI is not displaced.   Abdominal exam reveals good bowel sounds.  There is no guarding or rebound.  There is no hepatosplenomegaly or tenderness.  There are no masses.  Exam of the legs reveal no clubbing, cyanosis, or edema.  The legs are without rashes.  The distal pulses are intact.  Cranial nerves II - XII are intact.  Motor and sensory functions are intact.  The gait is normal.  Assessment / Plan:

## 2011-04-07 ENCOUNTER — Telehealth: Payer: Self-pay | Admitting: *Deleted

## 2011-04-07 NOTE — Telephone Encounter (Signed)
Patient called with lab results. Faxed her results with a list of high potassium foods to add to diet daily.  Pt verbalized understanding. Alfonso Ramus RN

## 2011-04-07 NOTE — Telephone Encounter (Signed)
Message copied by Mahalia Longest on Thu Apr 07, 2011  9:32 AM ------      Message from: [place], [place]      Created: Wed Apr 06, 2011  1:47 PM       Potassium remains a bit low .  She is on Maxzide and KCL.  Continue current meds.

## 2011-04-19 NOTE — Consult Note (Signed)
 HEALTHCARE                          ENDOCRINOLOGY CONSULTATION   NAME:BOYCE-FELKER, GARGI BERCH                 MRN:          [id number]  DATE:05/18/2007                            DOB:          1941/03/21    REASON FOR VISIT:  Follow up diabetes.   HISTORY OF PRESENT ILLNESS:  A 70 year old woman who is having frequent  hypoglycemia, mild, before lunch.  She states her glucose otherwise is  well controlled.  She made her own decision to resume Januvia 100 mg  daily.  She states that the Januvia from the pharmacy works well, but  the samples I gave her caused side effects.  She now also takes  metformin extended release 2000 mg a day, but she is only on Amaryl 2 mg  every morning.   PAST MEDICAL HISTORY:  Same as Apr 17, 2007.   REVIEW OF SYSTEMS:  Denies syncope.   PHYSICAL EXAMINATION:  VITAL SIGNS:  Blood pressure 123/85, heart rate  84, temperature 98.1, weight 172.  GENERAL:  No distress.  NEUROLOGIC:  Alert and oriented.  Does not appear anxious nor depressed.   IMPRESSION:  1. Diabetes is over controlled.  2. She continues to have multiple false perceptions about treatment of      her diabetes, but she is making some progress.   PLAN:  1. Decrease Amaryl to 1 mg every morning.  2. Continue the same amount of metformin.  3. Continue Januvia 1 mg a day.  4. Return in 30 days.     Sean A. Everardo All, MD  Electronically Signed    SAE/MedQ  DD: 05/20/2007  DT: 05/20/2007  Job #: [id number]   cc:   Vikki Ports, M.D.

## 2011-04-19 NOTE — Consult Note (Signed)
[facility]                          ENDOCRINOLOGY CONSULTATION   NAME:[name], April MAIONE                 MRN:          [id number]  DATE:04/17/2007                            DOB:          03-23-1941    REASON FOR VISIT:  Followup diabetes.   HISTORY OF PRESENT ILLNESS:  This is a 70 year old woman who currently  takes Glucophage 1000 mg twice a day and Amaryl 4 mg q.a.m. for  diabetes.  She does not want to take the Januvia nor Actos, stating that  these did not treat her well.  She states that her home glucose ranges  from 70 up 127.   PAST MEDICAL HISTORY:  1. Insomnia.  2. Dyslipidemia.  3. Hypertension.  4. Multiple perceive drug intolerances.  5. Allergic rhinitis.   REVIEW OF SYSTEMS:  Denies hypoglycemia.   PHYSICAL EXAMINATION:  Blood pressure 162/91, heart rate 66, temperature  98.0, weight 172.  GENERAL:  No distress.  EXTREMITIES:  No edema.   IMPRESSION:  Therapy of diabetes is limited by multiple drug  intolerances.  She should not be too aggressive with herself on the urea  because of her risk of hypoglycemia.   PLAN:  1. Same medications.  2. Return in about two months, when she will be due for a Hemoglobin      A1c.     Sean A. Everardo All, MD  Electronically Signed    SAE/MedQ  DD: 04/17/2007  DT: 04/18/2007  Job #: [id number]   cc:   Vikki Ports, M.D.

## 2011-04-22 NOTE — Consult Note (Signed)
[facility]                          ENDOCRINOLOGY CONSULTATION   NAME:[name], [name]                 MRN:          [id number]  DATE:02/21/2007                            DOB:          [birth year]    REASON FOR VISIT:  Followup diabetes.   HISTORY OF PRESENT ILLNESS:  A 70 year old woman who has several days of  nausea, vomiting and diarrhea.   Regarding her diabetes, she states that her glucoses are much better on  Amaryl and Glucophage alone.   PAST MEDICAL HISTORY:  Same as February 16, 2007.   REVIEW OF SYSTEMS:  She denies any change in her weight. She did have  one episode of a glucose of 52 first thing in the morning.   PHYSICAL EXAMINATION:  VITAL SIGNS:  Blood pressure 129/74, heart rate  84, temperature 97.9, weight is 173.  GENERAL:  No distress.  EXTREMITIES:  No edema.   IMPRESSION:  1. Nausea, vomiting and diarrhea which if it is due to any medication      is most likely due to the metformin.  2. Ongoing improvement in her diabetes. However, therapy is limited by      multiple perceived drug intolerances.   PLAN:  1. I discussed the overall situation with her and I have told her that      in terms of her GI symptoms, the drug most likely to cause it would      be the metformin and I would rate Januvia #2. She is very hesitant      about taking the Actos but I have told her that the side effect of      the Actos is edema which she does not have. She agrees to restart      the Actos at at least half of a 45 mg tablet a day.  2. Return in a few weeks.  3. I have told her that she likely will need to reduce her Amaryl as      she is experiencing hypoglycemia.     Sean A. Everardo All, MD  Electronically Signed   SAE/MedQ  DD: 02/23/2007  DT: 02/23/2007  Job #: [id number]   cc:   Vikki Ports, M.D.

## 2011-04-22 NOTE — Consult Note (Signed)
[facility]                          ENDOCRINOLOGY CONSULTATION   JENAN, ELLEGOOD                 MRN:          [id number]  DATE:02/16/2007                            DOB:          [birth year]    REFERRING PHYSICIAN:  Vikki Ports, M.D.   REASON FOR REFERRAL:  Diabetes.   HISTORY OF PRESENT ILLNESS:  A 70 year old woman who was noted in 2007  to have a fasting glucose of 121.  She was advised to work with her  diet.  She presented a few days ago with several weeks of polyuria,  polydipsia and associated moderate dryness in the mouth.  She also had  some blurry vision.  Her glucose was in excess of 400 mg%.  She  describes her diet as excellent and her exercise is okay.  Dr.  Theresia Lo prescribed her Amaryl and metformin and gave her a meter.  Her  glucoses have improved to the 200s over the past few days.   PAST MEDICAL HISTORY:  1. Insomnia.  2. Hypertension.  3. Dyslipidemia.  4. Allergic rhinitis.  5. History of multiple perceived drug intolerances.   SOCIAL HISTORY:  She is married.  She works in a Teacher, music.   FAMILY HISTORY:  Positive for diabetes in both parents and a sister.   REVIEW OF SYSTEMS:  She has minimal shortness of breath.  She has lost  several pounds recently.  She denies fever, nausea and vomiting.   PHYSICAL EXAMINATION:  VITAL SIGNS:  Blood pressure was 148/87, heart  rate 74, temperature 97.0, weight of 173.  GENERAL:  No distress.  SKIN:  Not diaphoretic, no rash.  HEENT:  No proptosis.  No periorbital swelling.  Pharynx - mucous  membranes are dry.  NECK:  Supple.  No goiter.  CHEST:  Clear to auscultation.  No respiratory distress.  CARDIOVASCULAR:  No JVD.  No edema.  Regular rate and rhythm.  No  murmur.  EXTREMITIES:  Pedal pulses are intact, and there is no bruit at the  carotid arteries.  Feet normal color and temperature.  There is no ulcer  present on the feet.  NEUROLOGIC:  Alert, well oriented, and sensation is intact to touch on  the feet.   IMPRESSION:  1. Type 2 diabetes.  Improved on 2 medications, but it appears she      needs more medication.  2. Therapy may be limited by her multiple perceived drug intolerances.  3. Other risk factors as noted above.   PLAN:  1. We discussed the risk of her diabetes, as well as the importance of      controlling her other risk factors.  We discussed the importance of      diet and exercise therapy.  2. Keep your upcoming appointment with your dietician.  3. Add Actos 45 mg a day and Januvia 100 mg a day.  4. Return next week.  5. It is possible that this patient's glucose control would be helped      somewhat by changing her Maxzide to an ACE inhibitor, barring any      contraindication.  Sean A. Everardo All, MD  Electronically Signed    SAE/MedQ  DD: 02/18/2007  DT: 02/19/2007  Job #: [id number]   cc:   Vikki Ports, M.D.

## 2011-04-22 NOTE — Op Note (Signed)
NAME:  April [name], April [name]          ACCOUNT NO.:  [id number]   MEDICAL RECORD NO.:  [id number]          PATIENT TYPE:  AMB   LOCATION:  ENDO                         FACILITY:  [facility]   PHYSICIAN:  James L. Malon Kindle., M.D.DATE OF BIRTH:  Apr 11, 1941   DATE OF PROCEDURE:  03/24/2005  DATE OF DISCHARGE:                                 OPERATIVE REPORT   PROCEDURE:  Colonoscopy.   MEDICATIONS:  Fentanyl 60 mcg, Versed 6 mg IV.   SCOPE:  Olympus pediatric adjustable colonoscope.   INDICATIONS:  Colon cancer screening.   DESCRIPTION OF PROCEDURE:  The procedure explained to the patient and  consent obtained.  With the patient in the left lateral decubitus position,  the Olympus pediatric adjustable scope was inserted and advanced.  The prep  was excellent.  We were able to reach the cecum without difficulty.  The  ileocecal valve and appendiceal orifice were seen.  The scope was withdrawn  to the cecum and ascending colon, transverse colon, splenic flexure,  descending and sigmoid colon were seen well.  Scattered diverticula in the  sigmoid colon.  No polyps or other lesions seen.  The scope was withdrawn.  The patient tolerated the procedure well.   ASSESSMENT:  Normal screening colonoscopy.  V76.51.   PLAN:  Will recommend yearly Hemoccult's, possibly repeat procedure in 10  years.      JLE/MEDQ  D:  03/24/2005  T:  03/24/2005  Job:  [id number]   cc:   Vikki Ports, M.D.  [address] [place] [place]  [place]  [place] [address]  Fax: [phone]

## 2011-06-30 ENCOUNTER — Other Ambulatory Visit: Payer: Self-pay | Admitting: Endocrinology

## 2011-10-10 ENCOUNTER — Ambulatory Visit: Payer: BC Managed Care – HMO | Admitting: Cardiovascular Disease

## 2011-10-17 ENCOUNTER — Encounter: Payer: Self-pay | Admitting: Cardiovascular Disease

## 2011-10-17 ENCOUNTER — Ambulatory Visit (INDEPENDENT_AMBULATORY_CARE_PROVIDER_SITE_OTHER): Payer: BC Managed Care – HMO | Admitting: Cardiovascular Disease

## 2011-10-17 VITALS — BP 153/90 | HR 62 | Ht 64.0 in | Wt 178.8 lb

## 2011-10-17 DIAGNOSIS — E785 Hyperlipidemia, unspecified: Secondary | ICD-10-CM

## 2011-10-17 DIAGNOSIS — I10 Essential (primary) hypertension: Secondary | ICD-10-CM

## 2011-10-17 NOTE — Assessment & Plan Note (Addendum)
Her blood pressure is mildly elevated. I suspect this is because she has not taken her medications today.

## 2011-10-17 NOTE — Assessment & Plan Note (Signed)
Her blood pressure is a little bit elevated today. She did not take her medications.  Continue to watch her salt

## 2011-10-17 NOTE — Assessment & Plan Note (Addendum)
We'll draw fasting laboratory today and again in 6 months at her next office visit.

## 2011-10-17 NOTE — Patient Instructions (Signed)
Your physician wants you to follow-up in: 6 months You will receive a reminder letter in the mail two months in advance. If you don't receive a letter, please call our office to schedule the follow-up appointment.   Your physician recommends that you return for a FASTING lipid profile: 6 months and today  

## 2011-10-17 NOTE — Progress Notes (Signed)
Sherrel Ploch Boyce-Felker Date of Birth  Nov 24, 1941 [facility] [address]    [address] [place], [place]  [address] [phone]  Fax  [phone]  History of Present Illness:  April [name] is a 70 y.o. female with a Hx of HTN, hyperkalemia, hyperlipidemia,  DM II, and RA.  She complains of pain in her toes.  She denies any chest pain or dyspnea.  Current Outpatient Prescriptions on File Prior to Visit  Medication Sig Dispense Refill  . ACTOS 15 MG tablet TAKE 1 TABLET BY MOUTH ONCE A DAY  90 tablet  2  . amitriptyline (ELAVIL) 10 MG tablet Take 10 mg by mouth at bedtime.        . cetirizine (ZYRTEC) 10 MG tablet Take 10 mg by mouth daily.        . cloNIDine (CATAPRES) 0.3 MG tablet Take 0.3 mg by mouth 2 (two) times daily.        [name] ezetimibe (ZETIA) 10 MG tablet Take 10 mg by mouth daily.        . fexofenadine (ALLEGRA) 180 MG tablet Take 180 mg by mouth as needed.        . fluticasone (FLONASE) 50 MCG/ACT nasal spray 2 sprays by Nasal route as needed.        . irbesartan (AVAPRO) 300 MG tablet Take 300 mg by mouth at bedtime.        . potassium chloride SA (K-DUR,KLOR-CON) 20 MEQ tablet Take 20 mEq by mouth 2 (two) times daily.        . pravastatin (PRAVACHOL) 80 MG tablet Take 80 mg by mouth daily.        . traMADol (ULTRAM) 50 MG tablet Take 50 mg by mouth every 8 (eight) hours as needed.        . triamterene-hydrochlorothiazide (DYAZIDE) 37.5-25 MG per capsule Take 1 capsule by mouth every morning.        . etodolac (LODINE) 400 MG tablet Take 400 mg by mouth daily.          Allergies  Allergen Reactions  . Acetaminophen     REACTION: unspecified  . Aspirin     REACTION: unspecified  . Atorvastatin     REACTION: unspecified  . Morphine Sulfate     REACTION: unspecified  . Naproxen Sodium     REACTION: unspecified  . Sitagliptin Phosphate     REACTION: nausea    Past Medical History  Diagnosis Date  . Hypertension   . Diabetes mellitus     TYPE 2  . Hyperkalemia    . RA (rheumatoid arthritis)   . Insomnia   . Dyslipidemia   . Allergic rhinitis     No past surgical history on file.  History  Smoking status  . Never Smoker   Smokeless tobacco  . Not on file    History  Alcohol Use No    Family History  Problem Relation Age of Onset  . Diabetes Mother   . Diabetes Father   . Diabetes Sister     Reviw of Systems:  Reviewed in the HPI.  All other systems are negative.  Physical Exam: BP 153/90  Pulse 62  Ht 5\' 4"  (1.626 m)  Wt 178 lb 12.8 oz (81.103 kg)  BMI 30.69 kg/m2 The patient is alert and oriented x 3.  The mood and affect are normal.   Skin: warm and dry.  Color is normal.    HEENT:   Normocephalic/atraumatic. She is normal carotids.  There is no JVD.  Lungs: Her lung exam is clear   Heart: Regular rate S1-S2. She has been murmurs to    Abdomen: Her abdomen is soft. She is non-tender.  Extremities:  No clubbing cyanosis or edema. Her right toes are not edematous or red.  Neuro:  Nonfocal. Gait is normal to    ECG: Normal sinus rhythm. She has no ST or T wave changes.  Assessment / Plan:

## 2011-10-18 LAB — BASIC METABOLIC PANEL
BUN: 21 mg/dL (ref 6–23)
CO2: 27 mEq/L (ref 19–32)
Calcium: 9.3 mg/dL (ref 8.4–10.5)
Chloride: 107 mEq/L (ref 96–112)
Creatinine, Ser: 0.8 mg/dL (ref 0.4–1.2)
GFR: 92.97 mL/min (ref >60.00)
Glucose, Bld: 88 mg/dL (ref 70–99)
Potassium: 3.6 mEq/L (ref 3.5–5.1)
Sodium: 142 mEq/L (ref 135–145)

## 2011-10-21 ENCOUNTER — Ambulatory Visit: Payer: BC Managed Care – HMO | Admitting: Endocrinology

## 2011-10-31 ENCOUNTER — Other Ambulatory Visit (INDEPENDENT_AMBULATORY_CARE_PROVIDER_SITE_OTHER): Payer: BC Managed Care – HMO

## 2011-10-31 ENCOUNTER — Ambulatory Visit (INDEPENDENT_AMBULATORY_CARE_PROVIDER_SITE_OTHER): Payer: BC Managed Care – HMO | Admitting: Endocrinology

## 2011-10-31 ENCOUNTER — Encounter: Payer: Self-pay | Admitting: Endocrinology

## 2011-10-31 VITALS — BP 116/72 | HR 80 | Temp 97.9°F | Ht 64.0 in | Wt 174.0 lb

## 2011-10-31 DIAGNOSIS — E119 Type 2 diabetes mellitus without complications: Secondary | ICD-10-CM

## 2011-10-31 LAB — HEMOGLOBIN A1C: Hgb A1c MFr Bld: 6.3 % (ref 4.6–6.5)

## 2011-10-31 NOTE — Patient Instructions (Addendum)
blood tests are being requested for you today.  please call [phone] to hear your test results.  You will be prompted to enter the 9-digit "MRN" number that appears at the top left of this page, followed by #.  Then you will hear the message. Stop actos Based on the results, i may advise you to add a different medication for diabetes. Please come back for a follow-up appointment in 4 months.   Let me know if you want to see a foot doctor.   (update: i left message on phone-tree:  Same rx)

## 2011-10-31 NOTE — Progress Notes (Signed)
Subjective:    Patient ID: April [name], female    DOB: 1941-10-27, 70 y.o.   MRN: [id number]  HPI Pt returns for f/u of type 2 dm (2008).  She intermittently takes actos, due to edema She has few years of slight pain at the toes, but no assoc numbness.   Past Medical History  Diagnosis Date  . Hypertension   . Diabetes mellitus     TYPE 2  . Hyperkalemia   . RA (rheumatoid arthritis)   . Insomnia   . Dyslipidemia   . Allergic rhinitis     No past surgical history on file.  History   Social History  . Marital Status: Married    Spouse Name: N/A    Number of Children: N/A  . Years of Education: N/A   Occupational History  . Not on file.   Social History Main Topics  . Smoking status: Never Smoker   . Smokeless tobacco: Not on file  . Alcohol Use: No  . Drug Use: No  . Sexually Active:    Other Topics Concern  . Not on file   Social History Narrative  . No narrative on file    Current Outpatient Prescriptions on File Prior to Visit  Medication Sig Dispense Refill  . amitriptyline (ELAVIL) 10 MG tablet Take 10 mg by mouth at bedtime.        . cloNIDine (CATAPRES) 0.3 MG tablet Take 0.3 mg by mouth 2 (two) times daily.        [name] ezetimibe (ZETIA) 10 MG tablet Take 10 mg by mouth daily.        . fexofenadine (ALLEGRA) 180 MG tablet Take 180 mg by mouth as needed.        . fluticasone (FLONASE) 50 MCG/ACT nasal spray 2 sprays by Nasal route as needed.        . irbesartan (AVAPRO) 300 MG tablet Take 300 mg by mouth at bedtime.        . meloxicam (MOBIC) 15 MG tablet Take 15 mg by mouth as needed.        . metFORMIN (GLUCOPHAGE) 1000 MG tablet Take 1,000 mg by mouth 2 (two) times daily with a meal.       . potassium chloride SA (K-DUR,KLOR-CON) 20 MEQ tablet Take 20 mEq by mouth 2 (two) times daily.        . pravastatin (PRAVACHOL) 80 MG tablet Take 80 mg by mouth daily.        . traMADol (ULTRAM) 50 MG tablet Take 50 mg by mouth every 8 (eight) hours as  needed.        . triamterene-hydrochlorothiazide (DYAZIDE) 37.5-25 MG per capsule Take 1 capsule by mouth every morning.          Allergies  Allergen Reactions  . Acetaminophen     REACTION: unspecified  . Aspirin     REACTION: unspecified  . Atorvastatin     REACTION: unspecified  . Morphine Sulfate     REACTION: unspecified  . Naproxen Sodium     REACTION: unspecified  . Sitagliptin Phosphate     REACTION: nausea    Family History  Problem Relation Age of Onset  . Diabetes Mother   . Diabetes Father   . Diabetes Sister     BP 116/72  Pulse 80  Temp(Src) 97.9 F (36.6 C) (Oral)  Ht 5\' 4"  (1.626 m)  Wt 174 lb (78.926 kg)  BMI 29.87 kg/m2  SpO2 97%  Review of Systems Denies sob and signif weight change    Objective:   Physical Exam VITAL SIGNS:  See vs page GENERAL: no distress Pulses: dorsalis pedis intact bilat.   Feet: no deformity.  no ulcer on the feet.  feet are of normal color and temp.  Trace bilat leg edema Neuro: sensation is intact to touch on the feet     Lab Results  Component Value Date   HGBA1C 6.3 10/31/2011      Assessment & Plan:  DM, well-controlled Pain, prob due to neuropathy, new

## 2011-11-04 ENCOUNTER — Telehealth: Payer: Self-pay | Admitting: Cardiovascular Disease

## 2011-11-04 NOTE — Telephone Encounter (Signed)
LOV faxed to [facility] @ [phone]  KM

## 2012-01-31 ENCOUNTER — Other Ambulatory Visit: Payer: Self-pay | Admitting: Endocrinology

## 2012-02-29 ENCOUNTER — Other Ambulatory Visit (INDEPENDENT_AMBULATORY_CARE_PROVIDER_SITE_OTHER): Payer: BC Managed Care – HMO

## 2012-02-29 ENCOUNTER — Encounter: Payer: Self-pay | Admitting: Endocrinology

## 2012-02-29 ENCOUNTER — Telehealth: Payer: Self-pay | Admitting: *Deleted

## 2012-02-29 ENCOUNTER — Ambulatory Visit (INDEPENDENT_AMBULATORY_CARE_PROVIDER_SITE_OTHER): Payer: BC Managed Care – HMO | Admitting: Endocrinology

## 2012-02-29 VITALS — BP 128/74 | HR 72 | Temp 98.3°F | Ht 64.0 in | Wt 174.4 lb

## 2012-02-29 DIAGNOSIS — E119 Type 2 diabetes mellitus without complications: Secondary | ICD-10-CM

## 2012-02-29 DIAGNOSIS — Z79899 Other long term (current) drug therapy: Secondary | ICD-10-CM | POA: Insufficient documentation

## 2012-02-29 LAB — HEPATIC FUNCTION PANEL
ALT: 20 U/L (ref 0–35)
AST: 23 U/L (ref 0–37)
Albumin: 4.1 g/dL (ref 3.5–5.2)
Alkaline Phosphatase: 85 U/L (ref 39–117)
Bilirubin, Direct: 0.1 mg/dL (ref 0.0–0.3)
Total Bilirubin: 0.4 mg/dL (ref 0.3–1.2)
Total Protein: 7.4 g/dL (ref 6.0–8.3)

## 2012-02-29 LAB — HEMOGLOBIN A1C: Hgb A1c MFr Bld: 6.2 % (ref 4.6–6.5)

## 2012-02-29 MED ORDER — TERBINAFINE HCL 250 MG PO TABS: 250.0000 mg | ORAL_TABLET | Freq: Every day | ORAL | Status: DC

## 2012-02-29 NOTE — Telephone Encounter (Signed)
Called April [name] to inform of lab results. April [name] informed of results (Letter also mailed to pt_. 

## 2012-02-29 NOTE — Progress Notes (Signed)
Subjective:    Patient ID: April [name], female    DOB: 06-Jun-1941, 71 y.o.   MRN: [id number]  HPI Pt returns for f/u of type 2 dm (2008).    Pt states few years of intermittent moderate pain at the tips of both toenails.  No assoc numbness.   Past Medical History  Diagnosis Date  . Hypertension   . Diabetes mellitus     TYPE 2  . Hyperkalemia   . RA (rheumatoid arthritis)   . Insomnia   . Dyslipidemia   . Allergic rhinitis     No past surgical history on file.  History   Social History  . Marital Status: Married    Spouse Name: N/A    Number of Children: N/A  . Years of Education: N/A   Occupational History  . Not on file.   Social History Main Topics  . Smoking status: Never Smoker   . Smokeless tobacco: Not on file  . Alcohol Use: No  . Drug Use: No  . Sexually Active:    Other Topics Concern  . Not on file   Social History Narrative  . No narrative on file    Current Outpatient Prescriptions on File Prior to Visit  Medication Sig Dispense Refill  . amitriptyline (ELAVIL) 10 MG tablet Take 10 mg by mouth at bedtime.        . cloNIDine (CATAPRES) 0.3 MG tablet Take 0.3 mg by mouth 2 (two) times daily.        [name] ezetimibe (ZETIA) 10 MG tablet Take 10 mg by mouth daily.        . fexofenadine (ALLEGRA) 180 MG tablet Take 180 mg by mouth as needed.        . fluticasone (FLONASE) 50 MCG/ACT nasal spray 2 sprays by Nasal route as needed.        . irbesartan (AVAPRO) 300 MG tablet Take 300 mg by mouth at bedtime.        . metFORMIN (GLUCOPHAGE) 1000 MG tablet TAKE 1 TABLET BY MOUTH TWICE A DAY  180 tablet  2  . potassium chloride SA (K-DUR,KLOR-CON) 20 MEQ tablet Take 20 mEq by mouth 2 (two) times daily.        . pravastatin (PRAVACHOL) 80 MG tablet Take 80 mg by mouth daily.        . traMADol (ULTRAM) 50 MG tablet Take 50 mg by mouth every 8 (eight) hours as needed.        . triamterene-hydrochlorothiazide (DYAZIDE) 37.5-25 MG per capsule Take 1 capsule by  mouth every morning.          Allergies  Allergen Reactions  . Acetaminophen     REACTION: unspecified  . Actos (Pioglitazone Hydrochloride)     edema  . Aspirin     REACTION: unspecified  . Atorvastatin     REACTION: unspecified  . Morphine Sulfate     REACTION: unspecified  . Naproxen Sodium     REACTION: unspecified  . Sitagliptin Phosphate     REACTION: nausea    Family History  Problem Relation Age of Onset  . Diabetes Mother   . Diabetes Father   . Diabetes Sister    BP 128/74  Pulse 72  Temp(Src) 98.3 F (36.8 C) (Oral)  Ht 5\' 4"  (1.626 m)  Wt 174 lb 6.4 oz (79.107 kg)  BMI 29.94 kg/m2  SpO2 97%  Review of Systems Edema is better off actos.  No weight change  Objective:   Physical Exam VITAL SIGNS:  See vs page GENERAL: no distress Pulses: dorsalis pedis intact bilat.   Feet: no deformity, except for bilat bunions.  no ulcer on the feet.  feet are of normal color and temp.  no edema.  There is bilateral onychomycosis. Neuro: sensation is intact to touch on the feet  Lab Results  Component Value Date   HGBA1C 6.2 02/29/2012      Assessment & Plan:  DM, well-controlled Onychomycosis of toenails Toe pain, prob not related to DM.  More likely related to onychomycosis

## 2012-02-29 NOTE — Patient Instructions (Addendum)
blood tests are being requested for you today.  You will receive a letter with results. If your liver tests are normal, i'll prescribe for you a pill for the toenail fungus.  (see letter)

## 2012-03-13 ENCOUNTER — Telehealth: Payer: Self-pay | Admitting: *Deleted

## 2012-03-13 NOTE — Telephone Encounter (Signed)
PA needed for Terbinafine rx. PA started for Terbinafine ([phone]), awaiting decision of PA from insurance company by fax)

## 2012-03-14 ENCOUNTER — Other Ambulatory Visit: Payer: Self-pay | Admitting: Endocrinology

## 2012-03-14 MED ORDER — TERBINAFINE HCL 250 MG PO TABS: 250.0000 mg | ORAL_TABLET | Freq: Every day | ORAL | Status: DC

## 2012-03-14 NOTE — Telephone Encounter (Signed)
sent 

## 2012-03-14 NOTE — Telephone Encounter (Signed)
Pt would like rx sent to [facility].

## 2012-03-14 NOTE — Telephone Encounter (Signed)
Pt informed

## 2012-03-14 NOTE — Telephone Encounter (Signed)
PA denied-per Dr. Everardo All, rx will be $10 at [facility] without insurance-does pt want rx sent there

## 2012-05-14 ENCOUNTER — Encounter: Payer: Self-pay | Admitting: Endocrinology

## 2012-05-14 ENCOUNTER — Ambulatory Visit (INDEPENDENT_AMBULATORY_CARE_PROVIDER_SITE_OTHER): Payer: BC Managed Care – HMO | Admitting: Endocrinology

## 2012-05-14 VITALS — BP 122/82 | HR 81 | Temp 97.4°F | Ht 64.0 in | Wt 177.0 lb

## 2012-05-14 DIAGNOSIS — E119 Type 2 diabetes mellitus without complications: Secondary | ICD-10-CM

## 2012-05-14 MED ORDER — TERBINAFINE HCL 250 MG PO TABS: 250.0000 mg | ORAL_TABLET | Freq: Every day | ORAL | Status: AC

## 2012-05-14 MED ORDER — BROMOCRIPTINE MESYLATE 2.5 MG PO TABS: 2.5000 mg | ORAL_TABLET | Freq: Every day | ORAL | Status: DC

## 2012-05-14 MED ORDER — TERBINAFINE HCL 250 MG PO TABS: 250.0000 mg | ORAL_TABLET | Freq: Every day | ORAL | Status: DC

## 2012-05-14 NOTE — Progress Notes (Signed)
Subjective:    Patient ID: April [name], female    DOB: 1941/06/25, 71 y.o.   MRN: [id number]  HPI Pt returns for f/u of type 2 dm (dx'ed 2008; complicated by peripheral sensory neuropathy).  no cbg record, but states that, over the past month, cbg's have increased to the high-100's.  She is unable to cite precip factor for this, except celebrex given for her recent CTS surgery.   Past Medical History  Diagnosis Date  . Hypertension   . Diabetes mellitus     TYPE 2  . Hyperkalemia   . RA (rheumatoid arthritis)   . Insomnia   . Dyslipidemia   . Allergic rhinitis     No past surgical history on file.  History   Social History  . Marital Status: Married    Spouse Name: N/A    Number of Children: N/A  . Years of Education: N/A   Occupational History  . Not on file.   Social History Main Topics  . Smoking status: Never Smoker   . Smokeless tobacco: Not on file  . Alcohol Use: No  . Drug Use: No  . Sexually Active:    Other Topics Concern  . Not on file   Social History Narrative  . No narrative on file    Current Outpatient Prescriptions on File Prior to Visit  Medication Sig Dispense Refill  . ACCU-CHEK COMPACT TEST DRUM test strip       . ACCU-CHEK SOFTCLIX LANCETS lancets       . amitriptyline (ELAVIL) 10 MG tablet Take 10 mg by mouth at bedtime.        . cloNIDine (CATAPRES) 0.3 MG tablet Take 0.3 mg by mouth 2 (two) times daily.        [name] ezetimibe (ZETIA) 10 MG tablet Take 10 mg by mouth daily.        . fexofenadine (ALLEGRA) 180 MG tablet Take 180 mg by mouth as needed.        . fluticasone (FLONASE) 50 MCG/ACT nasal spray 2 sprays by Nasal route as needed.        . irbesartan (AVAPRO) 300 MG tablet Take 300 mg by mouth at bedtime.        . metFORMIN (GLUCOPHAGE) 1000 MG tablet TAKE 1 TABLET BY MOUTH TWICE A DAY  180 tablet  2  . potassium chloride SA (K-DUR,KLOR-CON) 20 MEQ tablet Take 20 mEq by mouth 2 (two) times daily.        . pravastatin  (PRAVACHOL) 80 MG tablet Take 80 mg by mouth daily.        . traMADol (ULTRAM) 50 MG tablet Take 50 mg by mouth every 8 (eight) hours as needed.        . triamterene-hydrochlorothiazide (DYAZIDE) 37.5-25 MG per capsule Take 1 capsule by mouth every morning.        . bromocriptine (PARLODEL) 2.5 MG tablet Take 1 tablet (2.5 mg total) by mouth at bedtime.  30 tablet  11    Allergies  Allergen Reactions  . Acetaminophen     REACTION: unspecified  . Actos (Pioglitazone Hydrochloride)     edema  . Aspirin     REACTION: unspecified  . Atorvastatin     REACTION: unspecified  . Morphine Sulfate     REACTION: unspecified  . Naproxen Sodium     REACTION: unspecified  . Sitagliptin Phosphate     REACTION: nausea    Family History  Problem Relation Age of Onset  .  Diabetes Mother   . Diabetes Father   . Diabetes Sister     BP 122/82  Pulse 81  Temp 97.4 F (36.3 C) (Oral)  Ht 5\' 4"  (1.626 m)  Wt 177 lb (80.287 kg)  BMI 30.38 kg/m2  SpO2 98%   Review of Systems denies hypoglycemia    Objective:   Physical Exam VITAL SIGNS:  See vs page GENERAL: no distress PSYCH: Alert and oriented x 3.  Does not appear anxious nor depressed.      Assessment & Plan:  DM.  needs increased rx

## 2012-05-14 NOTE — Patient Instructions (Addendum)
Please redo your diabetes blood test in 3 weeks.  You will receive a letter with results. Please add "bromocriptine," to help your blood sugar. It has possible side effects of nausea and dizziness.  These go away with time.  You can avoid these by taking it at bedtime, and by taking just take 1/2 pill for the first week.   Please come back for a follow-up appointment in 4 months

## 2012-05-23 ENCOUNTER — Encounter: Payer: Self-pay | Admitting: Cardiovascular Disease

## 2012-05-23 ENCOUNTER — Telehealth: Payer: Self-pay | Admitting: *Deleted

## 2012-05-23 ENCOUNTER — Ambulatory Visit (INDEPENDENT_AMBULATORY_CARE_PROVIDER_SITE_OTHER): Payer: BC Managed Care – PPO | Admitting: Cardiovascular Disease

## 2012-05-23 VITALS — BP 110/68 | HR 64 | Ht 64.0 in | Wt 173.4 lb

## 2012-05-23 DIAGNOSIS — I10 Essential (primary) hypertension: Secondary | ICD-10-CM

## 2012-05-23 DIAGNOSIS — E876 Hypokalemia: Secondary | ICD-10-CM

## 2012-05-23 DIAGNOSIS — E785 Hyperlipidemia, unspecified: Secondary | ICD-10-CM

## 2012-05-23 LAB — HEPATIC FUNCTION PANEL
ALT: 20 U/L (ref 0–35)
AST: 26 U/L (ref 0–37)
Albumin: 4.1 g/dL (ref 3.5–5.2)
Alkaline Phosphatase: 84 U/L (ref 39–117)
Bilirubin, Direct: 0 mg/dL (ref 0.0–0.3)
Total Bilirubin: 0.6 mg/dL (ref 0.3–1.2)
Total Protein: 7.5 g/dL (ref 6.0–8.3)

## 2012-05-23 LAB — BASIC METABOLIC PANEL
BUN: 17 mg/dL (ref 6–23)
CO2: 25 mEq/L (ref 19–32)
Calcium: 9.2 mg/dL (ref 8.4–10.5)
Chloride: 104 mEq/L (ref 96–112)
Creatinine, Ser: 0.9 mg/dL (ref 0.4–1.2)
GFR: 80.88 mL/min (ref >60.00)
Glucose, Bld: 92 mg/dL (ref 70–99)
Potassium: 3.3 mEq/L — ABNORMAL LOW (ref 3.5–5.1)
Sodium: 138 mEq/L (ref 135–145)

## 2012-05-23 LAB — LIPID PANEL
Cholesterol: 132 mg/dL (ref 0–200)
HDL: 77.1 mg/dL (ref >39.00)
LDL Cholesterol: 44 mg/dL (ref 0–99)
Total CHOL/HDL Ratio: 2
Triglycerides: 53 mg/dL (ref 0.0–149.0)
VLDL: 10.6 mg/dL (ref 0.0–40.0)

## 2012-05-23 MED ORDER — POTASSIUM CHLORIDE CRYS ER 20 MEQ PO TBCR: 20.0000 meq | EXTENDED_RELEASE_TABLET | Freq: Three times a day (TID) | ORAL | Status: DC

## 2012-05-23 NOTE — Assessment & Plan Note (Addendum)
April [name] is doing well. Her blood pressure is well controlled. I've asked her to continue with a good diet and exercise program.

## 2012-05-23 NOTE — Telephone Encounter (Signed)
Message copied by Antony Odea on Wed May 23, 2012  5:32 PM ------      Message from: Vesta Mixer      Created: Wed May 23, 2012  5:13 PM       This is a chronic problem.  She is on Kdur 20 BID.  Have her increase it to 20 TID. Recheck in 2-3 weeks.

## 2012-05-23 NOTE — Assessment & Plan Note (Signed)
We'll check her fasting lipids today. Continue with her current medications

## 2012-05-23 NOTE — Patient Instructions (Addendum)
Your physician recommends that you return for a FASTING lipid profile: today/ 6 months  Your physician wants you to follow-up in: 6 months  You will receive a reminder letter in the mail two months in advance. If you don't receive a letter, please call our office to schedule the follow-up appointment.

## 2012-05-23 NOTE — Progress Notes (Signed)
April [name] Date of Birth  Jan 19, 1941 [facility] [address]    [address] [place], [place]  [address] [phone]  Fax  [phone]  History of Present Illness:  April [name] is a 71 y.o. female with a Hx of HTN, hyperkalemia, hyperlipidemia,  DM II, and RA.  She complains of pain in her right hand.  She had another hand operation for her arthritis.  She denies any chest pain or dyspnea.  Current Outpatient Prescriptions on File Prior to Visit  Medication Sig Dispense Refill  . ACCU-CHEK COMPACT TEST DRUM test strip       . ACCU-CHEK SOFTCLIX LANCETS lancets       . amitriptyline (ELAVIL) 10 MG tablet Take 10 mg by mouth at bedtime.        . bromocriptine (PARLODEL) 2.5 MG tablet Take 1 tablet (2.5 mg total) by mouth at bedtime.  30 tablet  11  . cloNIDine (CATAPRES) 0.3 MG tablet Take 0.3 mg by mouth 2 (two) times daily.        [name] ezetimibe (ZETIA) 10 MG tablet Take 10 mg by mouth daily.        . fexofenadine (ALLEGRA) 180 MG tablet Take 180 mg by mouth as needed.        . fluticasone (FLONASE) 50 MCG/ACT nasal spray 2 sprays by Nasal route as needed.        . irbesartan (AVAPRO) 300 MG tablet Take 300 mg by mouth at bedtime.        . metFORMIN (GLUCOPHAGE) 1000 MG tablet TAKE 1 TABLET BY MOUTH TWICE A DAY  180 tablet  2  . potassium chloride SA (K-DUR,KLOR-CON) 20 MEQ tablet Take 20 mEq by mouth 2 (two) times daily.        . pravastatin (PRAVACHOL) 80 MG tablet Take 80 mg by mouth daily.        [name] terbinafine (LAMISIL) 250 MG tablet Take 1 tablet (250 mg total) by mouth daily.  90 tablet  0  . traMADol (ULTRAM) 50 MG tablet Take 50 mg by mouth every 8 (eight) hours as needed.        . triamterene-hydrochlorothiazide (DYAZIDE) 37.5-25 MG per capsule Take 1 capsule by mouth every morning.          Allergies  Allergen Reactions  . Acetaminophen     REACTION: unspecified  . Actos (Pioglitazone Hydrochloride)     edema  . Aspirin     REACTION: unspecified  .  Atorvastatin     REACTION: unspecified  . Morphine Sulfate     REACTION: unspecified  . Naproxen Sodium     REACTION: unspecified  . Sitagliptin Phosphate     REACTION: nausea    Past Medical History  Diagnosis Date  . Hypertension   . Diabetes mellitus     TYPE 2  . Hyperkalemia   . RA (rheumatoid arthritis)   . Insomnia   . Dyslipidemia   . Allergic rhinitis     Past Surgical History  Procedure Date  . Hand tendon surgery     Right    History  Smoking status  . Never Smoker   Smokeless tobacco  . Not on file    History  Alcohol Use No    Family History  Problem Relation Age of Onset  . Diabetes Mother   . Diabetes Father   . Diabetes Sister     Reviw of Systems:  Reviewed in the HPI.  All other systems are negative.  Physical Exam: BP 110/68  Pulse 64  Ht 5\' 4"  (1.626 m)  Wt 173 lb 6.4 oz (78.654 kg)  BMI 29.76 kg/m2 The patient is alert and oriented x 3.  The mood and affect are normal.   Skin: warm and dry.  Color is normal.    HEENT:   Normocephalic/atraumatic. She is normal carotids. There is no JVD.  Lungs: Her lung exam is clear   Heart: Regular rate S1-S2. She has 2/6 systolic murmur at the LSB.  Abdomen: Her abdomen is soft. She is non-tender.  Extremities:  No clubbing cyanosis or edema.   Neuro:  Nonfocal. Gait is normal  ECG:  Assessment / Plan:

## 2012-05-23 NOTE — Telephone Encounter (Signed)
Pt taking kdur as prescribed, new dose explained, lab date set.

## 2012-06-12 ENCOUNTER — Encounter: Payer: Self-pay | Admitting: Endocrinology

## 2012-06-12 ENCOUNTER — Other Ambulatory Visit (INDEPENDENT_AMBULATORY_CARE_PROVIDER_SITE_OTHER): Payer: BC Managed Care – PPO

## 2012-06-12 DIAGNOSIS — E119 Type 2 diabetes mellitus without complications: Secondary | ICD-10-CM

## 2012-06-12 DIAGNOSIS — E876 Hypokalemia: Secondary | ICD-10-CM

## 2012-06-12 LAB — BASIC METABOLIC PANEL
BUN: 18 mg/dL (ref 6–23)
CO2: 25 mEq/L (ref 19–32)
Calcium: 9.3 mg/dL (ref 8.4–10.5)
Chloride: 105 mEq/L (ref 96–112)
Creatinine, Ser: 0.8 mg/dL (ref 0.4–1.2)
GFR: 90.15 mL/min (ref >60.00)
Glucose, Bld: 105 mg/dL — ABNORMAL HIGH (ref 70–99)
Potassium: 3.4 mEq/L — ABNORMAL LOW (ref 3.5–5.1)
Sodium: 141 mEq/L (ref 135–145)

## 2012-06-12 LAB — HEMOGLOBIN A1C: Hgb A1c MFr Bld: 6.4 % (ref 4.6–6.5)

## 2012-06-13 ENCOUNTER — Telehealth: Payer: Self-pay | Admitting: *Deleted

## 2012-06-13 DIAGNOSIS — E876 Hypokalemia: Secondary | ICD-10-CM

## 2012-06-13 NOTE — Telephone Encounter (Signed)
Message copied by Antony Odea on Wed Jun 13, 2012 12:41 PM ------      Message from: Vesta Mixer      Created: Tue Jun 12, 2012  5:41 PM       Potassium continues to be low.

## 2012-06-13 NOTE — Telephone Encounter (Signed)
MAILED FOOD LIST RICH IN POTASSIUM.

## 2012-06-25 ENCOUNTER — Telehealth: Payer: Self-pay | Admitting: Cardiovascular Disease

## 2012-06-25 ENCOUNTER — Telehealth: Payer: Self-pay

## 2012-06-25 NOTE — Telephone Encounter (Signed)
Pt called stating that Bromocriptine is too expensive $53/month. Pt is requesting to have medication changed back to glipizide, please advise

## 2012-06-25 NOTE — Telephone Encounter (Signed)
Pt had request that Dr. Sigmund Hazel get a copy of the blood work from last OV please fax to [phone]  Or office# is [phone]

## 2012-06-25 NOTE — Telephone Encounter (Signed)
Stop bromocriptine Your blood sugar is too low to use the glipizide, so please stay-off for now. Please come back for a follow-up appointment in 3 months.

## 2012-06-25 NOTE — Telephone Encounter (Signed)
Left a message to call back. Labs for 06/13/12 faxed to [phone].

## 2012-06-26 NOTE — Telephone Encounter (Signed)
Left message for pt to callback office.  

## 2012-06-27 MED ORDER — BROMOCRIPTINE MESYLATE 2.5 MG PO TABS: 2.5000 mg | ORAL_TABLET | Freq: Every day | ORAL | Status: DC

## 2012-06-27 NOTE — Telephone Encounter (Signed)
Pt informed of MD's advisement. Pt would like an alternative medication for her DM because she states that her CBG's are still above 100.

## 2012-06-27 NOTE — Telephone Encounter (Signed)
Please start taking "bromocriptine," to help your blood sugar. It has possible side effects of nausea and dizziness.  These go away with time.  You can avoid these by taking it at bedtime, and by taking just take 1/2 pill for the first week.  i have sent a prescription to your pharmacy.

## 2012-06-28 NOTE — Addendum Note (Signed)
Addended by: Romero Belling on: 06/28/2012 12:12 PM   Modules accepted: Orders

## 2012-06-28 NOTE — Telephone Encounter (Signed)
Pt states that she cannot afford the Bromocriptine, it is too expensive.

## 2012-06-28 NOTE — Telephone Encounter (Signed)
In that case, take just the metformin.

## 2012-06-28 NOTE — Telephone Encounter (Signed)
Pt informed of MD's advisement. 

## 2012-09-13 ENCOUNTER — Other Ambulatory Visit (INDEPENDENT_AMBULATORY_CARE_PROVIDER_SITE_OTHER): Payer: BC Managed Care – PPO

## 2012-09-13 DIAGNOSIS — E876 Hypokalemia: Secondary | ICD-10-CM

## 2012-09-13 LAB — BASIC METABOLIC PANEL
BUN: 13 mg/dL (ref 6–23)
CO2: 29 mEq/L (ref 19–32)
Calcium: 9.2 mg/dL (ref 8.4–10.5)
Chloride: 104 mEq/L (ref 96–112)
Creatinine, Ser: 0.8 mg/dL (ref 0.4–1.2)
GFR: 94.1 mL/min (ref >60.00)
Glucose, Bld: 82 mg/dL (ref 70–99)
Potassium: 3.3 mEq/L — ABNORMAL LOW (ref 3.5–5.1)
Sodium: 139 mEq/L (ref 135–145)

## 2012-09-14 ENCOUNTER — Other Ambulatory Visit: Payer: Self-pay | Admitting: *Deleted

## 2012-09-14 DIAGNOSIS — E876 Hypokalemia: Secondary | ICD-10-CM

## 2012-09-14 MED ORDER — POTASSIUM CHLORIDE CRYS ER 20 MEQ PO TBCR: EXTENDED_RELEASE_TABLET | ORAL | Status: DC

## 2012-09-28 ENCOUNTER — Other Ambulatory Visit (INDEPENDENT_AMBULATORY_CARE_PROVIDER_SITE_OTHER): Payer: BC Managed Care – PPO

## 2012-09-28 DIAGNOSIS — I10 Essential (primary) hypertension: Secondary | ICD-10-CM

## 2012-09-28 DIAGNOSIS — E785 Hyperlipidemia, unspecified: Secondary | ICD-10-CM

## 2012-09-28 DIAGNOSIS — E876 Hypokalemia: Secondary | ICD-10-CM

## 2012-09-28 LAB — BASIC METABOLIC PANEL
BUN: 20 mg/dL (ref 6–23)
CO2: 27 mEq/L (ref 19–32)
Calcium: 8.7 mg/dL (ref 8.4–10.5)
Chloride: 103 mEq/L (ref 96–112)
Creatinine, Ser: 0.8 mg/dL (ref 0.4–1.2)
GFR: 95.5 mL/min (ref >60.00)
Glucose, Bld: 85 mg/dL (ref 70–99)
Potassium: 3.4 mEq/L — ABNORMAL LOW (ref 3.5–5.1)
Sodium: 138 mEq/L (ref 135–145)

## 2012-09-28 LAB — HEPATIC FUNCTION PANEL
ALT: 22 U/L (ref 0–35)
AST: 26 U/L (ref 0–37)
Albumin: 3.6 g/dL (ref 3.5–5.2)
Alkaline Phosphatase: 90 U/L (ref 39–117)
Bilirubin, Direct: 0 mg/dL (ref 0.0–0.3)
Total Bilirubin: 0.6 mg/dL (ref 0.3–1.2)
Total Protein: 7 g/dL (ref 6.0–8.3)

## 2012-09-28 LAB — LIPID PANEL
Cholesterol: 157 mg/dL (ref 0–200)
HDL: 67.7 mg/dL (ref >39.00)
LDL Cholesterol: 74 mg/dL (ref 0–99)
Total CHOL/HDL Ratio: 2
Triglycerides: 77 mg/dL (ref 0.0–149.0)
VLDL: 15.4 mg/dL (ref 0.0–40.0)

## 2012-10-01 ENCOUNTER — Other Ambulatory Visit: Payer: Self-pay | Admitting: *Deleted

## 2012-10-01 DIAGNOSIS — E876 Hypokalemia: Secondary | ICD-10-CM

## 2012-10-01 NOTE — Progress Notes (Signed)
Pt was not taking k+ as prescribed, pt was taking k+ 40 meq in am and 20 meq in evening, pt told to take 40 meq BID, lab recheck set for 2 weeks, pt agreed to plan.

## 2012-10-03 ENCOUNTER — Telehealth: Payer: Self-pay | Admitting: Cardiovascular Disease

## 2012-10-03 DIAGNOSIS — E876 Hypokalemia: Secondary | ICD-10-CM

## 2012-10-03 MED ORDER — POTASSIUM CHLORIDE CRYS ER 20 MEQ PO TBCR: 40.0000 meq | EXTENDED_RELEASE_TABLET | Freq: Three times a day (TID) | ORAL | Status: DC

## 2012-10-03 NOTE — Telephone Encounter (Signed)
lmom take k-dur 40 meq tid and come back in two weeks for a bmet

## 2012-10-03 NOTE — Telephone Encounter (Signed)
New problem:    returning April [name] called , Is taken Klor-Con  2 twice a day.

## 2012-10-16 ENCOUNTER — Other Ambulatory Visit (INDEPENDENT_AMBULATORY_CARE_PROVIDER_SITE_OTHER): Payer: Medicare Other

## 2012-10-16 DIAGNOSIS — E876 Hypokalemia: Secondary | ICD-10-CM

## 2012-10-16 LAB — BASIC METABOLIC PANEL
BUN: 14 mg/dL (ref 6–23)
CO2: 28 mEq/L (ref 19–32)
Calcium: 9.4 mg/dL (ref 8.4–10.5)
Chloride: 101 mEq/L (ref 96–112)
Creatinine, Ser: 0.8 mg/dL (ref 0.4–1.2)
GFR: 92.7 mL/min (ref >60.00)
Glucose, Bld: 84 mg/dL (ref 70–99)
Potassium: 3.7 mEq/L (ref 3.5–5.1)
Sodium: 138 mEq/L (ref 135–145)

## 2012-10-24 ENCOUNTER — Telehealth: Payer: Self-pay | Admitting: *Deleted

## 2012-10-24 NOTE — Telephone Encounter (Signed)
[employer] paperwork faxed to [phone],LMOVM for pt To let her know ready for Pick up

## 2012-10-24 NOTE — Telephone Encounter (Signed)
[employer] DISABILITY FORM FINISHED AND TAKEN TO MEDICAL RECORDS.

## 2012-10-30 NOTE — Telephone Encounter (Signed)
Spoke With Pt this am she will be By to pick PW up, Left at [facility] for Her to pick up 10/30/12/KM

## 2012-12-18 ENCOUNTER — Encounter: Payer: Self-pay | Admitting: Cardiovascular Disease

## 2012-12-25 ENCOUNTER — Other Ambulatory Visit: Payer: Self-pay | Admitting: *Deleted

## 2012-12-25 DIAGNOSIS — E876 Hypokalemia: Secondary | ICD-10-CM

## 2012-12-25 MED ORDER — POTASSIUM CHLORIDE CRYS ER 20 MEQ PO TBCR: 40.0000 meq | EXTENDED_RELEASE_TABLET | Freq: Three times a day (TID) | ORAL | Status: DC

## 2012-12-25 NOTE — Telephone Encounter (Signed)
Fax Received. Refill Completed. April [name] (R.M.A)   

## 2013-04-04 ENCOUNTER — Encounter: Payer: Self-pay | Admitting: Cardiovascular Disease

## 2013-04-17 ENCOUNTER — Other Ambulatory Visit: Payer: Self-pay | Admitting: *Deleted

## 2013-04-17 DIAGNOSIS — E876 Hypokalemia: Secondary | ICD-10-CM

## 2013-04-17 NOTE — Telephone Encounter (Signed)
Opened in Error.

## 2013-04-19 ENCOUNTER — Other Ambulatory Visit: Payer: Self-pay | Admitting: *Deleted

## 2013-04-19 DIAGNOSIS — E876 Hypokalemia: Secondary | ICD-10-CM

## 2013-04-19 MED ORDER — POTASSIUM CHLORIDE CRYS ER 20 MEQ PO TBCR: 40.0000 meq | EXTENDED_RELEASE_TABLET | Freq: Three times a day (TID) | ORAL | Status: DC

## 2013-06-17 ENCOUNTER — Ambulatory Visit: Payer: Medicare Other | Admitting: Cardiovascular Disease

## 2013-06-20 ENCOUNTER — Other Ambulatory Visit: Payer: Self-pay | Admitting: *Deleted

## 2013-06-20 ENCOUNTER — Other Ambulatory Visit (INDEPENDENT_AMBULATORY_CARE_PROVIDER_SITE_OTHER): Payer: Medicare Other

## 2013-06-20 DIAGNOSIS — E119 Type 2 diabetes mellitus without complications: Secondary | ICD-10-CM

## 2013-06-20 LAB — MICROALBUMIN / CREATININE URINE RATIO
Creatinine,U: 129.2 mg/dL
Microalb Creat Ratio: 0.5 mg/g (ref 0.0–30.0)
Microalb, Ur: 0.6 mg/dL (ref 0.0–1.9)

## 2013-06-20 LAB — LIPID PANEL
Cholesterol: 153 mg/dL (ref 0–200)
HDL: 71.1 mg/dL (ref >39.00)
LDL Cholesterol: 74 mg/dL (ref 0–99)
Total CHOL/HDL Ratio: 2
Triglycerides: 38 mg/dL (ref 0.0–149.0)
VLDL: 7.6 mg/dL (ref 0.0–40.0)

## 2013-06-20 LAB — URINALYSIS
Bilirubin Urine: NEGATIVE
Hgb urine dipstick: NEGATIVE
Ketones, ur: NEGATIVE
Leukocytes, UA: NEGATIVE
Nitrite: NEGATIVE
Specific Gravity, Urine: 1.025 (ref 1.000–1.030)
Total Protein, Urine: NEGATIVE
Urine Glucose: NEGATIVE
Urobilinogen, UA: 0.2 (ref 0.0–1.0)
pH: 5.5 (ref 5.0–8.0)

## 2013-06-20 LAB — HEMOGLOBIN A1C: Hgb A1c MFr Bld: 6.5 % (ref 4.6–6.5)

## 2013-06-21 ENCOUNTER — Other Ambulatory Visit: Payer: Medicare Other

## 2013-06-25 ENCOUNTER — Ambulatory Visit: Payer: Medicare Other | Admitting: Endocrinology

## 2013-06-27 ENCOUNTER — Ambulatory Visit (INDEPENDENT_AMBULATORY_CARE_PROVIDER_SITE_OTHER): Payer: Medicare Other | Admitting: Endocrinology

## 2013-06-27 ENCOUNTER — Encounter: Payer: Self-pay | Admitting: Endocrinology

## 2013-06-27 VITALS — BP 122/80 | HR 70 | Temp 98.4°F | Resp 10 | Ht 64.25 in | Wt 166.3 lb

## 2013-06-27 DIAGNOSIS — E119 Type 2 diabetes mellitus without complications: Secondary | ICD-10-CM

## 2013-06-27 DIAGNOSIS — E785 Hyperlipidemia, unspecified: Secondary | ICD-10-CM

## 2013-06-27 DIAGNOSIS — I10 Essential (primary) hypertension: Secondary | ICD-10-CM

## 2013-06-27 NOTE — Patient Instructions (Signed)
Please check blood sugars at least half the time about 2 hours after any meal and as directed on waking up.   Please bring blood sugar monitor to each visit  

## 2013-06-27 NOTE — Progress Notes (Signed)
Patient ID: April [name], female   DOB: Apr 06, 1941, 72 y.o.   MRN: [id number]  Reason for Appointment: Diabetes follow-up   History of Present Illness   Diagnosis: Type 2 DIABETES MELITUS, date of diagnosis: 2008 Her initial A1c at diagnosis was 11.4          Oral hypoglycemic drugs: Metformin        Side effects from medications: None Proper timing of medications in relation to meals:  not applicable.         Monitors blood glucose: Once a day.    Glucometer: One Touch.          Blood Glucose readings By recall: readings before breakfast: 89-139; pcl lower, did not bring meter for download Hypoglycemia frequency: Never.          Meals: 3 meals per day. Sugar higher with meats and fruits         Physical activity: exercise: Walking upto 2 hrs regularly           Dietician visit: Most recent: Unknown           Complications: are: None Microalbumin level normal in 1/14    The last HbgA1c was reported as 5.9 in 1/14  Previous weight was 175 pounds    Her diabetes has been well-controlled with metformin only and she takes this in the evening. Generally has been checking blood sugars in the mornings and not many after meals She usually is trying to watch her diet and walk as much as possible to keep her weight down  HYPERTENSION:  she is taking Avapro, clonidine and Dyazide with good control and no side effects  HYPERLIPIDEMIA:         The lipid abnormality consists of elevated LDL which is now well controlled with pravastatin with Zetia  No visits with results within 1 Week(s) from this visit. Latest known visit with results is:  Appointment on 06/20/2013  Component Date Value Range Status  . Hemoglobin A1C 06/20/2013 6.5  4.6 - 6.5 % Final   Glycemic Control Guidelines for People with Diabetes:Non Diabetic:  <6%Goal of Therapy: <7%Additional Action Suggested:  >8%   . Color, Urine 06/20/2013 LT. YELLOW  Yellow;Lt. Yellow Final  . APPearance 06/20/2013 CLEAR  Clear Final  .  Specific Gravity, Urine 06/20/2013 1.025  1.000-1.030 Final  . pH 06/20/2013 5.5  5.0 - 8.0 Final  . Total Protein, Urine 06/20/2013 NEGATIVE  Negative Final  . Urine Glucose 06/20/2013 NEGATIVE  Negative Final  . Ketones, ur 06/20/2013 NEGATIVE  Negative Final  . Bilirubin Urine 06/20/2013 NEGATIVE  Negative Final  . Hgb urine dipstick 06/20/2013 NEGATIVE  Negative Final  . Urobilinogen, UA 06/20/2013 0.2  0.0 - 1.0 Final  . Leukocytes, UA 06/20/2013 NEGATIVE  Negative Final  . Nitrite 06/20/2013 NEGATIVE  Negative Final  . Microalb, Ur 06/20/2013 0.6  0.0 - 1.9 mg/dL Final  . Creatinine,U 40/98/1191 129.2   Final  . Microalb Creat Ratio 06/20/2013 0.5  0.0 - 30.0 mg/g Final  . Cholesterol 06/20/2013 153  0 - 200 mg/dL Final   ATP III Classification       Desirable:  < 200 mg/dL               Borderline High:  200 - 239 mg/dL          High:  > = 478 mg/dL  . Triglycerides 06/20/2013 38.0  0.0 - 149.0 mg/dL Final   Normal:  <295 mg/dLBorderline High:  150 - 199 mg/dL  . HDL 06/20/2013 71.10  >39.00 mg/dL Final  . VLDL 16/09/9603 7.6  0.0 - 54.0 mg/dL Final  . LDL Cholesterol 06/20/2013 74  0 - 99 mg/dL Final  . Total CHOL/HDL Ratio 06/20/2013 2   Final                  Men          Women1/2 Average Risk     3.4          3.3Average Risk          5.0          4.42X Average Risk          9.6          7.13X Average Risk          15.0          11.0                          Medication List       This list is accurate as of: 06/27/13  2:48 PM.  Always use your most recent med list.               ACCU-CHEK COMPACT TEST DRUM test strip  Generic drug:  glucose blood     ACCU-CHEK SOFTCLIX LANCETS lancets     amitriptyline 10 MG tablet  Commonly known as:  ELAVIL  Take 10 mg by mouth at bedtime.     cloNIDine 0.3 MG tablet  Commonly known as:  CATAPRES  Take 0.3 mg by mouth 2 (two) times daily.     ezetimibe 10 MG tablet  Commonly known as:  ZETIA  Take 10 mg by mouth daily.      fexofenadine 180 MG tablet  Commonly known as:  ALLEGRA  Take 180 mg by mouth as needed.     fluticasone 50 MCG/ACT nasal spray  Commonly known as:  FLONASE  2 sprays by Nasal route as needed.     irbesartan 300 MG tablet  Commonly known as:  AVAPRO  Take 300 mg by mouth at bedtime.     metFORMIN 1000 MG tablet  Commonly known as:  GLUCOPHAGE  TAKE 1 TABLET BY MOUTH TWICE A DAY     methotrexate 2.5 MG tablet  Commonly known as:  RHEUMATREX  2 mg.     potassium chloride SA 20 MEQ tablet  Commonly known as:  K-DUR,KLOR-CON  Take 40 mEq by mouth 2 (two) times daily.     pravastatin 80 MG tablet  Commonly known as:  PRAVACHOL  Take 80 mg by mouth daily.     traMADol 50 MG tablet  Commonly known as:  ULTRAM  Take 50 mg by mouth every 8 (eight) hours as needed.     triamterene-hydrochlorothiazide 37.5-25 MG per capsule  Commonly known as:  DYAZIDE  Take 1 capsule by mouth every morning.        Allergies:  Allergies  Allergen Reactions  . Acetaminophen     REACTION: unspecified  . Actos (Pioglitazone Hydrochloride)     edema  . Aspirin     REACTION: unspecified  . Atorvastatin     REACTION: unspecified  . Morphine Sulfate     REACTION: unspecified  . Naproxen Sodium     REACTION: unspecified  . Sitagliptin Phosphate     REACTION: nausea    Past Medical History  Diagnosis  Date  . Hypertension   . Diabetes mellitus     TYPE 2  . Hyperkalemia   . RA (rheumatoid arthritis)   . Insomnia   . Dyslipidemia   . Allergic rhinitis     Past Surgical History  Procedure Laterality Date  . Hand tendon surgery      Right    Family History  Problem Relation Age of Onset  . Diabetes Mother   . Diabetes Father   . Diabetes Sister     Social History:  reports that she has never smoked. She does not have any smokeless tobacco history on file. She reports that she does not drink alcohol or use illicit drugs.  Review of Systems - Cardiovascular ROS: positive  for -  History of palpitations Sensitive toes and feet History of rheumatoid arthritis or treatment   Examination:   BP 122/80  Pulse 70  Temp(Src) 98.4 F (36.9 C)  Resp 10  Ht 5' 4.25" (1.632 m)  Wt 166 lb 4.8 oz (75.433 kg)  BMI 28.32 kg/m2  SpO2 97%  Body mass index is 28.32 kg/(m^2).   Assesment:   1. Diabetes type 2  The patient's diabetes control appears to be still fairly well controlled with upper normal A1c of 6.5  Very stable with taking metformin only Only mild obesity Has good compliance with lifestyle changes  2. Hypertension: Well controlled on 3 drug regimen  3. Has excellent control of lipids   PLAN:   1. To check home blood sugars on waking up or 2 hours after any meal  2. Walking or other exercise to be continued along with moderation of carbohydrates and total calories  3. No change in metformin 1 g b.i.d.  Martha Soltys 06/27/2013, 2:48 PM

## 2013-07-03 ENCOUNTER — Ambulatory Visit: Payer: Medicare Other | Admitting: Physician Assistant

## 2013-07-09 ENCOUNTER — Other Ambulatory Visit: Payer: Self-pay | Admitting: Endocrinology

## 2013-07-09 DIAGNOSIS — E7800 Pure hypercholesterolemia, unspecified: Secondary | ICD-10-CM

## 2013-07-09 DIAGNOSIS — R5383 Other fatigue: Secondary | ICD-10-CM

## 2013-07-09 DIAGNOSIS — R5381 Other malaise: Secondary | ICD-10-CM

## 2013-07-29 ENCOUNTER — Encounter: Payer: Self-pay | Admitting: Physician Assistant

## 2013-07-29 ENCOUNTER — Ambulatory Visit (INDEPENDENT_AMBULATORY_CARE_PROVIDER_SITE_OTHER): Payer: Medicare Other | Admitting: Physician Assistant

## 2013-07-29 VITALS — BP 130/80 | HR 85 | Ht 64.0 in | Wt 159.4 lb

## 2013-07-29 DIAGNOSIS — E785 Hyperlipidemia, unspecified: Secondary | ICD-10-CM

## 2013-07-29 DIAGNOSIS — I10 Essential (primary) hypertension: Secondary | ICD-10-CM

## 2013-07-29 NOTE — Patient Instructions (Addendum)
Your physician wants you to follow-up in: 1 year with Dr. Nahser.   You will receive a reminder letter in the mail two months in advance. If you don't receive a letter, please call our office to schedule the follow-up appointment.  

## 2013-07-29 NOTE — Progress Notes (Signed)
[address] [place], [place]  [address] Phone: [phone] Fax:  [phone]  Date:  07/29/2013   ID:  April [name], DOB August 12, 1941, MRN [id number]  PCP:  Neldon Labella, MD  Cardiologist:  Dr. Delane Ginger     History of Present Illness: April [name] is a 72 y.o. female who returns for annual follow up.    She has a hx of HTN, HL, DM2, RA.  Echo 6/08:  EF 55-60%, mild LVH, diast dysfxn, mild LAE, mild PI, trace MR, trace TR.  Last seen by Dr. Delane Ginger 05/2012.  Since last seen she is doing well.  The patient denies chest pain, shortness of breath, syncope, orthopnea, PND or significant pedal edema. She did have an episode 4 mos ago where she had an occipital HA assoc with blurred vision that lasted a just a few minutes.  She was evaluated by her PCP.    Labs (10/13):  K 3.4, Cr 0.8, ALT 22 Labs (11/13):  K 3.7, Cr 0.8 Labs (7/14):    LDL 74, A1c 6.5  Wt Readings from Last 3 Encounters:  07/29/13 159 lb 6.4 oz (72.303 kg)  06/27/13 166 lb 4.8 oz (75.433 kg)  05/23/12 173 lb 6.4 oz (78.654 kg)     Past Medical History  Diagnosis Date  . Hypertension   . Diabetes mellitus     TYPE 2  . Hyperkalemia   . RA (rheumatoid arthritis)   . Insomnia   . Dyslipidemia   . Allergic rhinitis     Current Outpatient Prescriptions  Medication Sig Dispense Refill  . ACCU-CHEK COMPACT TEST DRUM test strip       . ACCU-CHEK SOFTCLIX LANCETS lancets       . amitriptyline (ELAVIL) 10 MG tablet Take 10 mg by mouth at bedtime.        . cloNIDine (CATAPRES) 0.3 MG tablet Take 0.3 mg by mouth 2 (two) times daily.        [name] ezetimibe (ZETIA) 10 MG tablet Take 10 mg by mouth daily.        . fexofenadine (ALLEGRA) 180 MG tablet Take 180 mg by mouth as needed.        . fluticasone (FLONASE) 50 MCG/ACT nasal spray 2 sprays by Nasal route as needed.        . folic acid (FOLVITE) 0.5 MG tablet Take 1 mg by mouth daily.      . irbesartan (AVAPRO) 300 MG tablet  Take 300 mg by mouth at bedtime.        . metFORMIN (GLUCOPHAGE) 1000 MG tablet TAKE 1 TABLET BY MOUTH TWICE A DAY  180 tablet  2  . methotrexate (RHEUMATREX) 2.5 MG tablet Take 6 mg by mouth once a week.       . Omega-3 Fat Ac-Cholecalciferol (MINICAPS VITAMIN-D/OMEGA-3) [phone] MG-UNIT CAPS Take 1,000 mg by mouth. 2 tablets once a day      . potassium chloride SA (K-DUR,KLOR-CON) 20 MEQ tablet Take 40 mEq by mouth 2 (two) times daily.      . pravastatin (PRAVACHOL) 80 MG tablet Take 80 mg by mouth daily.        . tapentadol (NUCYNTA) 50 MG TABS tablet Take 50 mg by mouth as needed.      . triamterene-hydrochlorothiazide (DYAZIDE) 37.5-25 MG per capsule Take 1 capsule by mouth every morning.         No current facility-administered medications for this visit.    Allergies:  Allergies  Allergen Reactions  . Acetaminophen     REACTION: unspecified  . Actos [Pioglitazone Hydrochloride]     edema  . Aspirin     REACTION: unspecified  . Atorvastatin     REACTION: unspecified  . Morphine Sulfate     REACTION: unspecified  . Naproxen Sodium     REACTION: unspecified  . Sitagliptin Phosphate     REACTION: nausea    Social History:  The patient  reports that she has never smoked. She does not have any smokeless tobacco history on file. She reports that she does not drink alcohol or use illicit drugs.   ROS:  Please see the history of present illness.    All other systems reviewed and negative.   PHYSICAL EXAM: VS:  BP 130/80  Pulse 85  Ht 5\' 4"  (1.626 m)  Wt 159 lb 6.4 oz (72.303 kg)  BMI 27.35 kg/m2 Well nourished, well developed, in no acute distress HEENT: normal Neck: no JVD Vascular:  No carotid bruits bilaterally Cardiac:  normal S1, S2; RRR; no murmur Lungs:  clear to auscultation bilaterally, no wheezing, rhonchi or rales Abd: soft, nontender, no hepatomegaly Ext: no edema Skin: warm and dry Neuro:  CNs 2-12 intact, no focal abnormalities noted  EKG:  NSR, HR  85, normal axis, NSSTTW changes     ASSESSMENT AND PLAN:  1. Hypertension:  Controlled.  Continue current therapy.  She has regular lab work with her rheumatologist and endocrinologist.   2. Hyperlipidemia:  Recent LDL optimal.  Continue statin. 3. Disposition:  F/u with Dr. Delane Ginger in 1 year.  Signed, Tereso Newcomer, PA-C  07/29/2013 9:34 AM

## 2013-10-04 ENCOUNTER — Other Ambulatory Visit: Payer: Self-pay | Admitting: *Deleted

## 2013-10-04 ENCOUNTER — Telehealth: Payer: Self-pay | Admitting: Endocrinology

## 2013-10-04 MED ORDER — METFORMIN HCL 1000 MG PO TABS: ORAL_TABLET | ORAL | Status: DC

## 2013-10-04 NOTE — Telephone Encounter (Signed)
rx sent

## 2013-10-07 ENCOUNTER — Telehealth: Payer: Self-pay | Admitting: Cardiovascular Disease

## 2013-10-07 NOTE — Telephone Encounter (Signed)
Walk in pt Form " [place]" paper Dropped Off gave to [facility] 10/07/13/KM

## 2013-12-25 ENCOUNTER — Other Ambulatory Visit: Payer: Self-pay | Admitting: Cardiovascular Disease

## 2013-12-26 ENCOUNTER — Other Ambulatory Visit (INDEPENDENT_AMBULATORY_CARE_PROVIDER_SITE_OTHER): Payer: Medicare Other

## 2013-12-26 DIAGNOSIS — E785 Hyperlipidemia, unspecified: Secondary | ICD-10-CM

## 2013-12-26 DIAGNOSIS — E119 Type 2 diabetes mellitus without complications: Secondary | ICD-10-CM

## 2013-12-26 DIAGNOSIS — R5383 Other fatigue: Secondary | ICD-10-CM

## 2013-12-26 DIAGNOSIS — R5381 Other malaise: Secondary | ICD-10-CM

## 2013-12-26 LAB — COMPREHENSIVE METABOLIC PANEL
ALT: 19 U/L (ref 0–35)
AST: 23 U/L (ref 0–37)
Albumin: 4 g/dL (ref 3.5–5.2)
Alkaline Phosphatase: 68 U/L (ref 39–117)
BUN: 15 mg/dL (ref 6–23)
CO2: 28 mEq/L (ref 19–32)
Calcium: 9.1 mg/dL (ref 8.4–10.5)
Chloride: 103 mEq/L (ref 96–112)
Creatinine, Ser: 0.8 mg/dL (ref 0.4–1.2)
GFR: 91.05 mL/min (ref >60.00)
Glucose, Bld: 89 mg/dL (ref 70–99)
Potassium: 3.3 mEq/L — ABNORMAL LOW (ref 3.5–5.1)
Sodium: 139 mEq/L (ref 135–145)
Total Bilirubin: 0.8 mg/dL (ref 0.3–1.2)
Total Protein: 7 g/dL (ref 6.0–8.3)

## 2013-12-26 LAB — CBC WITH DIFFERENTIAL/PLATELET
Basophils Absolute: 0 10*3/uL (ref 0.0–0.1)
Basophils Relative: 0.4 % (ref 0.0–3.0)
Eosinophils Absolute: 0.1 10*3/uL (ref 0.0–0.7)
Eosinophils Relative: 1.8 % (ref 0.0–5.0)
HCT: 35.5 % — ABNORMAL LOW (ref 36.0–46.0)
Hemoglobin: 11.5 g/dL — ABNORMAL LOW (ref 12.0–15.0)
Lymphocytes Relative: 35.1 % (ref 12.0–46.0)
Lymphs Abs: 1.7 10*3/uL (ref 0.7–4.0)
MCHC: 32.3 g/dL (ref 30.0–36.0)
MCV: 86.7 fl (ref 78.0–100.0)
Monocytes Absolute: 0.3 10*3/uL (ref 0.1–1.0)
Monocytes Relative: 6.6 % (ref 3.0–12.0)
Neutro Abs: 2.7 10*3/uL (ref 1.4–7.7)
Neutrophils Relative %: 56.1 % (ref 43.0–77.0)
Platelets: 280 10*3/uL (ref 150.0–400.0)
RBC: 4.1 Mil/uL (ref 3.87–5.11)
RDW: 14.8 % — ABNORMAL HIGH (ref 11.5–14.6)
WBC: 4.9 10*3/uL (ref 4.5–10.5)

## 2013-12-26 LAB — LIPID PANEL
Cholesterol: 129 mg/dL (ref 0–200)
HDL: 67.4 mg/dL (ref >39.00)
LDL Cholesterol: 53 mg/dL (ref 0–99)
Total CHOL/HDL Ratio: 2
Triglycerides: 41 mg/dL (ref 0.0–149.0)
VLDL: 8.2 mg/dL (ref 0.0–40.0)

## 2013-12-26 LAB — MICROALBUMIN / CREATININE URINE RATIO
Creatinine,U: 166.1 mg/dL
Microalb Creat Ratio: 1.1 mg/g (ref 0.0–30.0)
Microalb, Ur: 1.8 mg/dL (ref 0.0–1.9)

## 2013-12-26 LAB — VITAMIN B12: Vitamin B-12: 1412 pg/mL — ABNORMAL HIGH (ref 211–911)

## 2013-12-26 LAB — TSH: TSH: 1.24 u[IU]/mL (ref 0.35–5.50)

## 2013-12-26 LAB — HEMOGLOBIN A1C: Hgb A1c MFr Bld: 6 % (ref 4.6–6.5)

## 2013-12-27 LAB — VITAMIN D 25 HYDROXY (VIT D DEFICIENCY, FRACTURES): Vit D, 25-Hydroxy: 54 ng/mL (ref 30–89)

## 2014-01-02 ENCOUNTER — Ambulatory Visit (INDEPENDENT_AMBULATORY_CARE_PROVIDER_SITE_OTHER): Payer: Medicare Other | Admitting: Endocrinology

## 2014-01-02 ENCOUNTER — Other Ambulatory Visit: Payer: Self-pay | Admitting: *Deleted

## 2014-01-02 ENCOUNTER — Encounter: Payer: Self-pay | Admitting: Endocrinology

## 2014-01-02 VITALS — BP 114/68 | HR 74 | Temp 98.2°F | Resp 16 | Ht 64.0 in | Wt 157.8 lb

## 2014-01-02 DIAGNOSIS — E785 Hyperlipidemia, unspecified: Secondary | ICD-10-CM

## 2014-01-02 DIAGNOSIS — E119 Type 2 diabetes mellitus without complications: Secondary | ICD-10-CM

## 2014-01-02 DIAGNOSIS — D649 Anemia, unspecified: Secondary | ICD-10-CM

## 2014-01-02 DIAGNOSIS — E559 Vitamin D deficiency, unspecified: Secondary | ICD-10-CM

## 2014-01-02 DIAGNOSIS — E876 Hypokalemia: Secondary | ICD-10-CM

## 2014-01-02 DIAGNOSIS — Z23 Encounter for immunization: Secondary | ICD-10-CM

## 2014-01-02 DIAGNOSIS — I10 Essential (primary) hypertension: Secondary | ICD-10-CM

## 2014-01-02 MED ORDER — POTASSIUM CHLORIDE CRYS ER 20 MEQ PO TBCR: EXTENDED_RELEASE_TABLET | ORAL | Status: DC

## 2014-01-02 NOTE — Progress Notes (Signed)
Patient ID: April [name], female   DOB: 05/27/1941, 73 y.o.   MRN: [id number]   Reason for Appointment: Diabetes follow-up   History of Present Illness   Diagnosis: Type 2 DIABETES MELITUS, date of diagnosis: 2008 Her initial A1c at diagnosis was 11.4 The last HbgA1c was reported as 5.9 in 1/14          Oral hypoglycemic drugs: Metformin        Side effects from medications: None        Her diabetes has been well-controlled with metformin only and she takes this in the evening.  Again has been checking blood sugars in the mornings and not many after meals She usually is trying to watch her diet and walk fairly regularly Previous weight was 175 pounds and she has lost significant weight    Monitors blood glucose: Once a day.    Glucometer: One Touch.          Blood Glucose readings By recall: readings before breakfast: 89-141; pc lunch lower, did not bring meter for download Hypoglycemia frequency: Never.           Meals: 3 meals per day. Sugar higher with eating watermelon at night        Physical activity: exercise: Walking upto 2 hrs regularly           Dietician visit: Most recent: Unknown           Complications: are: None  Wt Readings from Last 3 Encounters:  01/02/14 157 lb 12.8 oz (71.578 kg)  07/29/13 159 lb 6.4 oz (72.303 kg)  06/27/13 166 lb 4.8 oz (75.433 kg)   Lab Results  Component Value Date   HGBA1C 6.0 12/26/2013   HGBA1C 6.5 06/20/2013   HGBA1C 6.4 06/12/2012   Lab Results  Component Value Date   MICROALBUR 1.8 12/26/2013   LDLCALC 53 12/26/2013   CREATININE 0.8 12/26/2013    HYPOKALEMIA: This has been a chronic problem, previously prescribed 6 tablets of potassium daily by cardiologist. Recently getting only 4 tablets daily and potassium is 3.3  HYPERTENSION:  she is taking Avapro, clonidine and Dyazide with good control   HYPERLIPIDEMIA:  this is well controlled with pravastatin with Zetia, prescribed by PCP. Asking about generic for Zetia because of  cost  LABS:  No visits with results within 1 Week(s) from this visit. Latest known visit with results is:  Appointment on 12/26/2013  Component Date Value Range Status  . Microalb, Ur 12/26/2013 1.8  0.0 - 1.9 mg/dL Final  . Creatinine,U 73/40/3709 166.1   Final  . Microalb Creat Ratio 12/26/2013 1.1  0.0 - 30.0 mg/g Final  . Vit D, 25-Hydroxy 12/26/2013 54  30 - 89 ng/mL Final   Comment: This assay accurately quantifies Vitamin D, which is the sum of the                          25-Hydroxy forms of Vitamin D2 and D3.  Studies have shown that the                          optimum concentration of 25-Hydroxy Vitamin D is 30 ng/mL or higher.                           Concentrations of Vitamin D between 20 and 29 ng/mL are considered to  be insufficient and concentrations less than 20 ng/mL are considered                          to be deficient for Vitamin D.  . Cholesterol 12/26/2013 129  0 - 200 mg/dL Final   ATP III Classification       Desirable:  < 200 mg/dL               Borderline High:  200 - 239 mg/dL          High:  > = 735 mg/dL  . Triglycerides 12/26/2013 41.0  0.0 - 149.0 mg/dL Final   Normal:  <670 mg/dLBorderline High:  150 - 199 mg/dL  . HDL 12/26/2013 67.40  >39.00 mg/dL Final  . VLDL 14/09/3012 8.2  0.0 - 14.3 mg/dL Final  . LDL Cholesterol 12/26/2013 53  0 - 99 mg/dL Final  . Total CHOL/HDL Ratio 12/26/2013 2   Final                  Men          Women1/2 Average Risk     3.4          3.3Average Risk          5.0          4.42X Average Risk          9.6          7.13X Average Risk          15.0          11.0                      . Sodium 12/26/2013 139  135 - 145 mEq/L Final  . Potassium 12/26/2013 3.3* 3.5 - 5.1 mEq/L Final  . Chloride 12/26/2013 103  96 - 112 mEq/L Final  . CO2 12/26/2013 28  19 - 32 mEq/L Final  . Glucose, Bld 12/26/2013 89  70 - 99 mg/dL Final  . BUN 88/87/5797 15  6 - 23 mg/dL Final  . Creatinine, Ser 12/26/2013 0.8  0.4  - 1.2 mg/dL Final  . Total Bilirubin 12/26/2013 0.8  0.3 - 1.2 mg/dL Final  . Alkaline Phosphatase 12/26/2013 68  39 - 117 U/L Final  . AST 12/26/2013 23  0 - 37 U/L Final  . ALT 12/26/2013 19  0 - 35 U/L Final  . Total Protein 12/26/2013 7.0  6.0 - 8.3 g/dL Final  . Albumin 28/20/6015 4.0  3.5 - 5.2 g/dL Final  . Calcium 61/53/7943 9.1  8.4 - 10.5 mg/dL Final  . GFR 27/61/4709 91.05  >60.00 mL/min Final  . Hemoglobin A1C 12/26/2013 6.0  4.6 - 6.5 % Final   Glycemic Control Guidelines for People with Diabetes:Non Diabetic:  <6%Goal of Therapy: <7%Additional Action Suggested:  >8%   . WBC 12/26/2013 4.9  4.5 - 10.5 K/uL Final  . RBC 12/26/2013 4.10  3.87 - 5.11 Mil/uL Final  . Hemoglobin 12/26/2013 11.5* 12.0 - 15.0 g/dL Final  . HCT 29/57/4734 35.5* 36.0 - 46.0 % Final  . MCV 12/26/2013 86.7  78.0 - 100.0 fl Final  . MCHC 12/26/2013 32.3  30.0 - 36.0 g/dL Final  . RDW 03/70/9643 14.8* 11.5 - 14.6 % Final  . Platelets 12/26/2013 280.0  150.0 - 400.0 K/uL Final  . Neutrophils Relative % 12/26/2013 56.1  43.0 - 77.0 % Final  . Lymphocytes  Relative 12/26/2013 35.1  12.0 - 46.0 % Final  . Monocytes Relative 12/26/2013 6.6  3.0 - 12.0 % Final  . Eosinophils Relative 12/26/2013 1.8  0.0 - 5.0 % Final  . Basophils Relative 12/26/2013 0.4  0.0 - 3.0 % Final  . Neutro Abs 12/26/2013 2.7  1.4 - 7.7 K/uL Final  . Lymphs Abs 12/26/2013 1.7  0.7 - 4.0 K/uL Final  . Monocytes Absolute 12/26/2013 0.3  0.1 - 1.0 K/uL Final  . Eosinophils Absolute 12/26/2013 0.1  0.0 - 0.7 K/uL Final  . Basophils Absolute 12/26/2013 0.0  0.0 - 0.1 K/uL Final  . TSH 12/26/2013 1.24  0.35 - 5.50 uIU/mL Final  . Vitamin B-12 12/26/2013 1412* 211 - 911 pg/mL Final      Medication List       This list is accurate as of: 01/02/14 10:18 AM.  Always use your most recent med list.               ACCU-CHEK COMPACT TEST DRUM test strip  Generic drug:  glucose blood     ACCU-CHEK SOFTCLIX LANCETS lancets      amitriptyline 10 MG tablet  Commonly known as:  ELAVIL  Take 10 mg by mouth at bedtime.     cloNIDine 0.3 MG tablet  Commonly known as:  CATAPRES  Take 0.3 mg by mouth 2 (two) times daily.     ezetimibe 10 MG tablet  Commonly known as:  ZETIA  Take 10 mg by mouth daily.     fexofenadine 180 MG tablet  Commonly known as:  ALLEGRA  Take 180 mg by mouth as needed.     fluticasone 50 MCG/ACT nasal spray  Commonly known as:  FLONASE  2 sprays by Nasal route as needed.     folic acid 0.5 MG tablet  Commonly known as:  FOLVITE  Take 1 mg by mouth daily.     hydrochlorothiazide 25 MG tablet  Commonly known as:  HYDRODIURIL     irbesartan 300 MG tablet  Commonly known as:  AVAPRO  Take 300 mg by mouth at bedtime.     KLOR-CON M20 20 MEQ tablet  Generic drug:  potassium chloride SA  TAKE 2 TABLETS (40 MEQ TOTAL) BY MOUTH 3 (THREE) TIMES DAILY.     metFORMIN 1000 MG tablet  Commonly known as:  GLUCOPHAGE  TAKE 1 TABLET BY MOUTH TWICE A DAY     methotrexate 2.5 MG tablet  Commonly known as:  RHEUMATREX  Take 6 mg by mouth once a week.     NUCYNTA 50 MG Tabs tablet  Generic drug:  tapentadol  Take 50 mg by mouth as needed.     pravastatin 80 MG tablet  Commonly known as:  PRAVACHOL  Take 80 mg by mouth daily.        Allergies:  Allergies  Allergen Reactions  . Acetaminophen     REACTION: unspecified  . Actos [Pioglitazone Hydrochloride]     edema  . Aspirin     REACTION: unspecified  . Atorvastatin     REACTION: unspecified  . Morphine Sulfate     REACTION: unspecified  . Naproxen Sodium     REACTION: unspecified  . Sitagliptin Phosphate     REACTION: nausea    Past Medical History  Diagnosis Date  . Hypertension   . Diabetes mellitus     TYPE 2  . Hyperkalemia   . RA (rheumatoid arthritis)   . Insomnia   . Dyslipidemia   .  Allergic rhinitis     Past Surgical History  Procedure Laterality Date  . Hand tendon surgery      Right    Family  History  Problem Relation Age of Onset  . Diabetes Mother   . Diabetes Father   . Diabetes Sister     Social History:  reports that she has never smoked. She does not have any smokeless tobacco history on file. She reports that she does not drink alcohol or use illicit drugs.  Review of Systems - Cardiovascular: positive for -  History of palpitations  History of rheumatoid arthritis or treatment  Anemia: mild; is on iron, probably related to a combination of rheumatoid arthritis and iron deficiency as well as chronic disease  Vitamin D deficiency: Currently taking vitamin D 3, 1000 U daily    Examination:   BP 114/68  Pulse 74  Temp(Src) 98.2 F (36.8 C)  Resp 16  Ht 5\' 4"  (1.626 m)  Wt 157 lb 12.8 oz (71.578 kg)  BMI 27.07 kg/m2  SpO2 99%  Body mass index is 27.07 kg/(m^2).   No pedal edema  Assesment/PLAN:  1. Diabetes type 2  The patient's diabetes control appears to be excellent with upper normal A1c of 6.0  She is stable with taking metformin 2 g daily in monotherapy and also benefiting from weight loss over the last year However discussed checking readings at various times of the day including after meals and bring monitor for review on the next visit Advised her to have a bedtime snack  With some protein instead of just fruit which raises her overnight glucose Has good compliance with diet and exercise otherwise Will also review records from [facility] when available  2. Hypertension: Well controlled on 3 drug regimen  3. Hypokalemia: She needs a higher dose since her level is low with taking 4 tablets a day  Given a new prescription for potassium, 6 tablets daily and she will followup with her cardiologist for repeat testing  4. Hypercholesterolemia: This is well controlled with LDL 53. To followup with PCP/cardiologist regarding alternatives to Zetia or continuing on statin alone  5. Preventive care: She is refusing to take influenza vaccine.  Has  never had a Pneumovax. Discussed benefits and importance of taking this with her multiple medical problems including diabetes and rheumatoid arthritis. She agrees to do this today  Counseling time over 50% of today's 25 minute visit  Dashan Chizmar 01/02/2014, 10:18 AM

## 2014-04-18 ENCOUNTER — Other Ambulatory Visit: Payer: Self-pay | Admitting: *Deleted

## 2014-04-18 MED ORDER — METFORMIN HCL 1000 MG PO TABS: ORAL_TABLET | ORAL | Status: DC

## 2014-06-05 ENCOUNTER — Other Ambulatory Visit: Payer: Self-pay | Admitting: Family Medicine

## 2014-06-05 DIAGNOSIS — R102 Pelvic and perineal pain: Secondary | ICD-10-CM

## 2014-06-10 ENCOUNTER — Encounter (INDEPENDENT_AMBULATORY_CARE_PROVIDER_SITE_OTHER): Payer: Self-pay

## 2014-06-10 ENCOUNTER — Ambulatory Visit
Admission: RE | Admit: 2014-06-10 | Discharge: 2014-06-10 | Disposition: A | Discharge status: Home / Self Care | Payer: Medicare Other | Source: Ambulatory Visit | Attending: Family Medicine | Admitting: Family Medicine

## 2014-06-10 DIAGNOSIS — R102 Pelvic and perineal pain: Secondary | ICD-10-CM

## 2014-07-02 ENCOUNTER — Other Ambulatory Visit (INDEPENDENT_AMBULATORY_CARE_PROVIDER_SITE_OTHER): Payer: Medicare Other

## 2014-07-02 ENCOUNTER — Ambulatory Visit (INDEPENDENT_AMBULATORY_CARE_PROVIDER_SITE_OTHER): Payer: Medicare Other | Admitting: Cardiovascular Disease

## 2014-07-02 ENCOUNTER — Encounter: Payer: Self-pay | Admitting: Cardiovascular Disease

## 2014-07-02 VITALS — BP 146/90 | HR 61 | Ht 64.0 in | Wt 163.0 lb

## 2014-07-02 DIAGNOSIS — I10 Essential (primary) hypertension: Secondary | ICD-10-CM

## 2014-07-02 DIAGNOSIS — E876 Hypokalemia: Secondary | ICD-10-CM

## 2014-07-02 DIAGNOSIS — E785 Hyperlipidemia, unspecified: Secondary | ICD-10-CM

## 2014-07-02 DIAGNOSIS — E119 Type 2 diabetes mellitus without complications: Secondary | ICD-10-CM

## 2014-07-02 DIAGNOSIS — E7800 Pure hypercholesterolemia, unspecified: Secondary | ICD-10-CM

## 2014-07-02 LAB — LIPID PANEL
Cholesterol: 136 mg/dL (ref 0–200)
HDL: 74.1 mg/dL (ref >39.00)
LDL Cholesterol: 58 mg/dL (ref 0–99)
NonHDL: 61.9
Total CHOL/HDL Ratio: 2
Triglycerides: 21 mg/dL (ref 0.0–149.0)
VLDL: 4.2 mg/dL (ref 0.0–40.0)

## 2014-07-02 LAB — BASIC METABOLIC PANEL
BUN: 19 mg/dL (ref 6–23)
CO2: 30 mEq/L (ref 19–32)
Calcium: 9.2 mg/dL (ref 8.4–10.5)
Chloride: 104 mEq/L (ref 96–112)
Creatinine, Ser: 0.8 mg/dL (ref 0.4–1.2)
GFR: 95.01 mL/min (ref >60.00)
Glucose, Bld: 91 mg/dL (ref 70–99)
Potassium: 3 mEq/L — ABNORMAL LOW (ref 3.5–5.1)
Sodium: 139 mEq/L (ref 135–145)

## 2014-07-02 LAB — HEMOGLOBIN A1C: Hgb A1c MFr Bld: 6.1 % (ref 4.6–6.5)

## 2014-07-02 MED ORDER — TRIAMTERENE-HCTZ 37.5-25 MG PO TABS: 1.0000 | ORAL_TABLET | Freq: Every day | ORAL | Status: DC

## 2014-07-02 NOTE — Patient Instructions (Signed)
Your physician has recommended you make the following change in your medication:  STOP HCTZ START Maxzide 37.5/25 mg once daily  Your physician wants you to follow-up in: 1 year with Dr. Elease Hashimoto.  You will receive a reminder letter in the mail two months in advance. If you don't receive a letter, please call our office to schedule the follow-up appointment.

## 2014-07-02 NOTE — Assessment & Plan Note (Signed)
April [name]'s  blood pressures fairly well-controlled. She continues to have problems with hypokalemia. She takes K-Dur 60 mEq through the day. At this point we will stop the HCTZ and start her on Maxzide 37.5/25 mg a day. She'll have an basic metabolic profile drawn next week at Dr. Remus Blake office.  She may need to be worked up for other causes of hypokalemia.   I will see her in 1 year.

## 2014-07-02 NOTE — Progress Notes (Signed)
April [name] Date of Birth  03/03/1941 [facility] [address]    [address] [place], [place]  [address] [phone]  Fax  [phone]  History of Present Illness:  April [name] is a 73 y.o. female with a Hx of HTN, hyperkalemia, hyperlipidemia,  DM II, and RA.  She complains of pain in her right hand.  She had another hand operation for her arthritis.  She denies any chest pain or dyspnea.  July 02, 2014:  April [name] is having some fatigue - especially when she is out in the hot sun.   Current Outpatient Prescriptions on File Prior to Visit  Medication Sig Dispense Refill  . ACCU-CHEK COMPACT TEST DRUM test strip       . ACCU-CHEK SOFTCLIX LANCETS lancets       . cloNIDine (CATAPRES) 0.3 MG tablet Take 0.3 mg by mouth 2 (two) times daily.        [name] ezetimibe (ZETIA) 10 MG tablet Take 10 mg by mouth daily.        . fexofenadine (ALLEGRA) 180 MG tablet Take 180 mg by mouth as needed.        . fluticasone (FLONASE) 50 MCG/ACT nasal spray 2 sprays by Nasal route as needed.        . folic acid (FOLVITE) 0.5 MG tablet Take 1 mg by mouth daily.      . hydrochlorothiazide (HYDRODIURIL) 25 MG tablet       . irbesartan (AVAPRO) 300 MG tablet Take 300 mg by mouth at bedtime.        . metFORMIN (GLUCOPHAGE) 1000 MG tablet TAKE 1 TABLET BY MOUTH TWICE A DAY  180 tablet  0  . methotrexate (RHEUMATREX) 2.5 MG tablet Take 6 mg by mouth once a week.       . potassium chloride SA (KLOR-CON M20) 20 MEQ tablet TAKE 2 TABLETS (40 MEQ TOTAL) BY MOUTH 3 (THREE) TIMES DAILY.  540 tablet  2  . pravastatin (PRAVACHOL) 80 MG tablet Take 80 mg by mouth daily.        . tapentadol (NUCYNTA) 50 MG TABS tablet Take 50 mg by mouth as needed.       No current facility-administered medications on file prior to visit.    Allergies  Allergen Reactions  . Acetaminophen     REACTION: unspecified  . Actos [Pioglitazone Hydrochloride]     edema  . Aspirin     REACTION: unspecified  . Atorvastatin    REACTION: unspecified  . Morphine Sulfate     REACTION: unspecified  . Naproxen Sodium     REACTION: unspecified  . Sitagliptin Phosphate     REACTION: nausea    Past Medical History  Diagnosis Date  . Hypertension   . Diabetes mellitus     TYPE 2  . Hyperkalemia   . RA (rheumatoid arthritis)   . Insomnia   . Dyslipidemia   . Allergic rhinitis     Past Surgical History  Procedure Laterality Date  . Hand tendon surgery      Right    History  Smoking status  . Never Smoker   Smokeless tobacco  . Not on file    History  Alcohol Use No    Family History  Problem Relation Age of Onset  . Diabetes Mother   . Diabetes Father   . Diabetes Sister     Reviw of Systems:  Reviewed in the HPI.  All other systems are negative.  Physical Exam: BP  146/90  Pulse 61  Ht 5\' 4"  (1.626 m)  Wt 163 lb (73.936 kg)  BMI 27.97 kg/m2 The patient is alert and oriented x 3.  The mood and affect are normal.   Skin: warm and dry.  Color is normal.    HEENT:   Normocephalic/atraumatic. She is normal carotids. There is no JVD.  Lungs: Her lung exam is clear   Heart: Regular rate S1-S2. She has 2/6 systolic murmur at the LSB.  Abdomen: Her abdomen is soft. She is non-tender.  Extremities:  No clubbing cyanosis or edema.   Neuro:  Nonfocal. Gait is normal  ECG: July 02, 2014:  NSR at 29. NS T wave abnormality. Assessment / Plan:

## 2014-07-07 ENCOUNTER — Encounter: Payer: Self-pay | Admitting: Endocrinology

## 2014-07-07 ENCOUNTER — Ambulatory Visit (INDEPENDENT_AMBULATORY_CARE_PROVIDER_SITE_OTHER): Payer: Medicare Other | Admitting: Endocrinology

## 2014-07-07 VITALS — BP 149/73 | HR 78 | Temp 98.0°F | Resp 16 | Ht 64.0 in | Wt 164.0 lb

## 2014-07-07 DIAGNOSIS — E876 Hypokalemia: Secondary | ICD-10-CM

## 2014-07-07 DIAGNOSIS — I10 Essential (primary) hypertension: Secondary | ICD-10-CM

## 2014-07-07 DIAGNOSIS — E119 Type 2 diabetes mellitus without complications: Secondary | ICD-10-CM

## 2014-07-07 LAB — BASIC METABOLIC PANEL
BUN: 16 mg/dL (ref 6–23)
CO2: 27 mEq/L (ref 19–32)
Calcium: 9.2 mg/dL (ref 8.4–10.5)
Chloride: 101 mEq/L (ref 96–112)
Creatinine, Ser: 0.8 mg/dL (ref 0.4–1.2)
GFR: 96.45 mL/min (ref >60.00)
Glucose, Bld: 92 mg/dL (ref 70–99)
Potassium: 3 mEq/L — ABNORMAL LOW (ref 3.5–5.1)
Sodium: 137 mEq/L (ref 135–145)

## 2014-07-07 MED ORDER — DOXAZOSIN MESYLATE 2 MG PO TABS: 2.0000 mg | ORAL_TABLET | Freq: Every day | ORAL | Status: DC

## 2014-07-07 MED ORDER — GUANFACINE HCL ER 2 MG PO TB24: 2.0000 mg | ORAL_TABLET | Freq: Every day | ORAL | Status: DC

## 2014-07-07 MED ORDER — LABETALOL HCL 100 MG PO TABS: 100.0000 mg | ORAL_TABLET | Freq: Two times a day (BID) | ORAL | Status: DC

## 2014-07-07 NOTE — Patient Instructions (Signed)
Change clonidine, Maxzide and irbesartan to 3 new Rxs

## 2014-07-07 NOTE — Progress Notes (Signed)
Patient ID: April [name], female   DOB: 1941-01-16, 73 y.o.   MRN: [id number]   Reason for Appointment: Diabetes follow-up   History of Present Illness   Diagnosis: Type 2 DIABETES MELITUS, date of diagnosis: 2008 Her initial A1c at diagnosis was 11.4 The last HbgA1c was reported as 5.9 in 1/14          Oral hypoglycemic drugs: Metformin        Side effects from medications: None        Her diabetes has been well-controlled with metformin only and she takes this in the evening.  Again has been checking blood sugars in the mornings and usually not after eating She usually is trying to watch her diet and walk fairly regularly She has difficulty walking as much in hot weather as it makes her weak and tired Previous weight was 175 pounds and she is starting to gain back some of the weight now.  Monitors blood glucose: ? Frequency     Glucometer: One Touch.          Blood Glucose readings at home are usually near-normal, does not remember readings Hypoglycemia frequency: Never.           Meals: 3 meals per day. May be getting more portions  now      Physical activity: exercise: Walking upto 1.5 hrs regularly           Dietician visit: Most recent: At time of diagnosis         Complications: are: None  Wt Readings from Last 3 Encounters:  07/07/14 164 lb (74.39 kg)  07/02/14 163 lb (73.936 kg)  01/02/14 157 lb 12.8 oz (71.578 kg)   Lab Results  Component Value Date   HGBA1C 6.1 07/02/2014   HGBA1C 6.0 12/26/2013   HGBA1C 6.5 06/20/2013   Lab Results  Component Value Date   MICROALBUR 1.8 12/26/2013   LDLCALC 58 07/02/2014   CREATININE 0.8 07/02/2014    HYPOKALEMIA: This has been a chronic problem, previously prescribed 6 tablets of potassium daily  However she is not taking the prescribed dose  Recently getting only 3 tablets daily and potassium is 3.0 Her cardiologist has changed her HCTZ to Maxzide a few days ago  HYPERTENSION:  diagnosed around 1979 with symptoms of  headaches Currently she is taking Avapro, clonidine and Dyazide but blood pressure appears relatively higher recently even with cardiologist She does not monitor her blood pressure at home Takes Clonidine in pm only because of drowsiness with the morning dose  HYPERLIPIDEMIA:  this is well controlled with pravastatin with Zetia, prescribed by PCP. Asking about generic for Zetia because of cost   LABS:  Appointment on 07/02/2014  Component Date Value Ref Range Status  . Sodium 07/02/2014 139  135 - 145 mEq/L Final  . Potassium 07/02/2014 3.0* 3.5 - 5.1 mEq/L Final  . Chloride 07/02/2014 104  96 - 112 mEq/L Final  . CO2 07/02/2014 30  19 - 32 mEq/L Final  . Glucose, Bld 07/02/2014 91  70 - 99 mg/dL Final  . BUN 21/58/7276 19  6 - 23 mg/dL Final  . Creatinine, Ser 07/02/2014 0.8  0.4 - 1.2 mg/dL Final  . Calcium 18/48/5927 9.2  8.4 - 10.5 mg/dL Final  . GFR 63/94/3200 95.01  >60.00 mL/min Final  . Hemoglobin A1C 07/02/2014 6.1  4.6 - 6.5 % Final   Glycemic Control Guidelines for People with Diabetes:Non Diabetic:  <6%Goal of Therapy: <7%Additional Action Suggested:  >  8%   . Cholesterol 07/02/2014 136  0 - 200 mg/dL Final   ATP III Classification       Desirable:  < 200 mg/dL               Borderline High:  200 - 239 mg/dL          High:  > = 102 mg/dL  . Triglycerides 07/02/2014 21.0  0.0 - 149.0 mg/dL Final   Normal:  <725 mg/dLBorderline High:  150 - 199 mg/dL  . HDL 07/02/2014 74.10  >39.00 mg/dL Final  . VLDL 36/64/4034 4.2  0.0 - 74.2 mg/dL Final  . LDL Cholesterol 07/02/2014 58  0 - 99 mg/dL Final  . Total CHOL/HDL Ratio 07/02/2014 2   Final                  Men          Women1/2 Average Risk     3.4          3.3Average Risk          5.0          4.42X Average Risk          9.6          7.13X Average Risk          15.0          11.0                      . NonHDL 07/02/2014 61.90   Final   NOTE:  Non-HDL goal should be 30 mg/dL higher than patient's LDL goal (i.e. LDL goal of < 70  mg/dL, would have non-HDL goal of < 100 mg/dL)      Medication List       This list is accurate as of: 07/07/14  8:51 AM.  Always use your most recent med list.               ACCU-CHEK COMPACT TEST DRUM test strip  Generic drug:  glucose blood     ACCU-CHEK SOFTCLIX LANCETS lancets     cloNIDine 0.3 MG tablet  Commonly known as:  CATAPRES  Take 0.3 mg by mouth 2 (two) times daily.     ezetimibe 10 MG tablet  Commonly known as:  ZETIA  Take 10 mg by mouth daily.     fexofenadine 180 MG tablet  Commonly known as:  ALLEGRA  Take 180 mg by mouth as needed.     fluticasone 50 MCG/ACT nasal spray  Commonly known as:  FLONASE  2 sprays by Nasal route as needed.     folic acid 0.5 MG tablet  Commonly known as:  FOLVITE  Take 1 mg by mouth daily.     irbesartan 300 MG tablet  Commonly known as:  AVAPRO  Take 300 mg by mouth at bedtime.     metFORMIN 1000 MG tablet  Commonly known as:  GLUCOPHAGE  TAKE 1 TABLET BY MOUTH TWICE A DAY     methotrexate 2.5 MG tablet  Commonly known as:  RHEUMATREX  Take 6 mg by mouth once a week.     NUCYNTA 50 MG Tabs tablet  Generic drug:  tapentadol  Take 50 mg by mouth as needed.     potassium chloride SA 20 MEQ tablet  Commonly known as:  KLOR-CON M20  TAKE 2 TABLETS (40 MEQ TOTAL) BY MOUTH 3 (THREE) TIMES DAILY.  pravastatin 80 MG tablet  Commonly known as:  PRAVACHOL  Take 80 mg by mouth daily.     triamterene-hydrochlorothiazide 37.5-25 MG per tablet  Commonly known as:  MAXZIDE-25  Take 1 tablet by mouth daily.        Allergies:  Allergies  Allergen Reactions  . Acetaminophen     REACTION: unspecified  . Actos [Pioglitazone Hydrochloride]     edema  . Aspirin     REACTION: unspecified  . Atorvastatin     REACTION: unspecified  . Morphine Sulfate     REACTION: unspecified  . Naproxen Sodium     REACTION: unspecified  . Sitagliptin Phosphate     REACTION: nausea    Past Medical History  Diagnosis Date   . Hypertension   . Diabetes mellitus     TYPE 2  . Hyperkalemia   . RA (rheumatoid arthritis)   . Insomnia   . Dyslipidemia   . Allergic rhinitis     Past Surgical History  Procedure Laterality Date  . Hand tendon surgery      Right    Family History  Problem Relation Age of Onset  . Diabetes Mother   . Diabetes Father   . Diabetes Sister     Social History:  reports that she has never smoked. She does not have any smokeless tobacco history on file. She reports that she does not drink alcohol or use illicit drugs.  Review of Systems - Cardiovascular: positive for -  History of palpitations, possibly drug-related No known coronary artery disease  History of rheumatoid arthritis on treatment, not on steroids  Anemia: mild; is on iron, probably related to a combination of rheumatoid arthritis and iron deficiency as well as chronic disease  Vitamin D deficiency: Currently taking vitamin D 3, 1000 U daily    Examination:   BP 149/73  Pulse 78  Temp(Src) 98 F (36.7 C)  Resp 16  Ht 5\' 4"  (1.626 m)  Wt 164 lb (74.39 kg)  BMI 28.14 kg/m2  SpO2 97%  Body mass index is 28.14 kg/(m^2).   No pedal edema  Assesment/PLAN:  1. Diabetes type 2  The patient's diabetes control appears to be excellent with upper normal A1c of 6.1  She is stable with taking metformin 2 g daily in monotherapy and also benefiting from weight loss over the last year However discussed checking readings at various times of the day including after meals and bring monitor for review on the next visit Advised her to have a bedtime snack  With some protein instead of just fruit which raises her overnight glucose Has good compliance with diet usually but needs to start back on exercise  2. Hypertension: She is on 3 drug regimen and recent blood pressure is higher. However she is taking reportedly 0.6 mg clonidine at bedtime and not during the day.  She can try taking Tenex 2 mg at bedtime which is  going to cause less tiredness of mouth and somnolence during the day Also because of her hypokalemia despite taking potassium supplements needs evaluation for hyperaldosteronism  3. Hypokalemia: She has not been taking the prescribed dose and potassium is lower Agree with cardiologist that we should evaluate her for hyperaldosteronism Since she is on a diuretic and also an ARB drug will not be able to do her labs now Will have to switch her to doxazosin and labetalol instead of Maxzide and Avapro temporarily She will check her blood pressure regularly also and call if  unusually high or low, discussed targets To check followup potassium today also  4. Hypercholesterolemia: This is well controlled with LDL 68.  Since she does not have CAD she may be more to get control without Zetia. Will defer to PCP  Total visit time including coordination of care, review of labs and counseling regarding diabetes and evaluation of secondary  Hypertension = 25 minutes  Brindle Leyba 07/07/2014, 8:51 AM

## 2014-07-08 NOTE — Progress Notes (Signed)
Quick Note:  Potassium is still low, instead of 3 tablets take 5 tablets a day for the next week and then stop ______

## 2014-07-09 ENCOUNTER — Telehealth: Payer: Self-pay | Admitting: Endocrinology

## 2014-07-09 NOTE — Telephone Encounter (Signed)
Patient would like to know why she was prescribed Intuniv? She states she does not have ADHD and wants to know why?  Thank You

## 2014-07-09 NOTE — Telephone Encounter (Signed)
Please see below and advise.

## 2014-07-10 NOTE — Telephone Encounter (Signed)
Left message on patients vm, per Dr. Lucianne Muss, this is fine to take, it's for blood pressure.

## 2014-07-10 NOTE — Telephone Encounter (Signed)
Probably  getting guanfacine: Please confirm, this is a blood pressure pill

## 2014-07-28 ENCOUNTER — Other Ambulatory Visit: Payer: Medicare Other

## 2014-07-28 ENCOUNTER — Other Ambulatory Visit: Payer: Self-pay | Admitting: *Deleted

## 2014-07-28 DIAGNOSIS — E876 Hypokalemia: Secondary | ICD-10-CM

## 2014-07-28 DIAGNOSIS — I10 Essential (primary) hypertension: Secondary | ICD-10-CM

## 2014-07-29 ENCOUNTER — Ambulatory Visit: Payer: Medicare Other | Admitting: Cardiovascular Disease

## 2014-08-04 ENCOUNTER — Other Ambulatory Visit: Payer: Medicare Other

## 2014-08-04 ENCOUNTER — Encounter: Payer: Self-pay | Admitting: Endocrinology

## 2014-08-04 ENCOUNTER — Ambulatory Visit (INDEPENDENT_AMBULATORY_CARE_PROVIDER_SITE_OTHER): Payer: Medicare Other | Admitting: Endocrinology

## 2014-08-04 VITALS — BP 128/72 | HR 73 | Temp 98.2°F | Resp 16 | Ht 64.0 in | Wt 164.6 lb

## 2014-08-04 DIAGNOSIS — E876 Hypokalemia: Secondary | ICD-10-CM

## 2014-08-04 DIAGNOSIS — I10 Essential (primary) hypertension: Secondary | ICD-10-CM

## 2014-08-04 DIAGNOSIS — E7800 Pure hypercholesterolemia, unspecified: Secondary | ICD-10-CM

## 2014-08-04 LAB — POTASSIUM: Potassium: 3.8 mEq/L (ref 3.5–5.1)

## 2014-08-04 NOTE — Progress Notes (Signed)
Patient ID: April [name], female   DOB: Oct 10, 1941, 73 y.o.   MRN: [id number]   Reason for Appointment: Potassium follow-up   History of Present Illness   HYPOKALEMIA: This has been a chronic problem, has been prescribed 6 tablets of potassium daily recently, previously was taking only 3 Potassium was 3.0 when her HCTZ was changed to Maxzide by cardiologist For evaluation of her aldosterone the diuretic was stopped, potassium pending from today She says that subjectively she feels much better  Aldosterone/renin ratio is pending from last week  HYPERTENSION:  diagnosed around 1979 with symptoms of headaches Previously she is taking Avapro, clonidine 0.6 at bedtime and Dyazide but blood pressure was relatively high with this regimen She does not monitor her blood pressure at home For evaluation of her hyperaldosteronism she was switched to labetalol, Tenex 2 mg and doxazosin Her blood pressure appears to be significantly better, no lightheadedness on standing up  DIABETES: This has been fairly well controlled, she says her blood sugars are excellent now at home   LABS:  No visits with results within 1 Week(s) from this visit. Latest known visit with results is:  Office Visit on 07/07/2014  Component Date Value Ref Range Status  . Sodium 07/07/2014 137  135 - 145 mEq/L Final  . Potassium 07/07/2014 3.0* 3.5 - 5.1 mEq/L Final  . Chloride 07/07/2014 101  96 - 112 mEq/L Final  . CO2 07/07/2014 27  19 - 32 mEq/L Final  . Glucose, Bld 07/07/2014 92  70 - 99 mg/dL Final  . BUN 86/[birth year]/1950 16  6 - 23 mg/dL Final  . Creatinine, Ser 07/07/2014 0.8  0.4 - 1.2 mg/dL Final  . Calcium 93/26/7124 9.2  8.4 - 10.5 mg/dL Final  . GFR 58/08/9832 96.45  >60.00 mL/min Final      Medication List       This list is accurate as of: 08/04/14  8:46 AM.  Always use your most recent med list.               ACCU-CHEK COMPACT TEST DRUM test strip  Generic drug:  glucose blood     ACCU-CHEK  SOFTCLIX LANCETS lancets     cloNIDine 0.3 MG tablet  Commonly known as:  CATAPRES  Take 0.3 mg by mouth 2 (two) times daily.     doxazosin 2 MG tablet  Commonly known as:  CARDURA  Take 1 tablet (2 mg total) by mouth at bedtime.     ezetimibe 10 MG tablet  Commonly known as:  ZETIA  Take 10 mg by mouth daily.     fexofenadine 180 MG tablet  Commonly known as:  ALLEGRA  Take 180 mg by mouth as needed.     fluticasone 50 MCG/ACT nasal spray  Commonly known as:  FLONASE  2 sprays by Nasal route as needed.     folic acid 0.5 MG tablet  Commonly known as:  FOLVITE  Take 1 mg by mouth daily.     guanFACINE 2 MG Tb24 SR tablet  Commonly known as:  INTUNIV  Take 1 tablet (2 mg total) by mouth daily.     irbesartan 300 MG tablet  Commonly known as:  AVAPRO  Take 300 mg by mouth at bedtime.     labetalol 100 MG tablet  Commonly known as:  NORMODYNE  Take 1 tablet (100 mg total) by mouth 2 (two) times daily.     metFORMIN 1000 MG tablet  Commonly known as:  GLUCOPHAGE  TAKE 1 TABLET BY MOUTH TWICE A DAY     methotrexate 2.5 MG tablet  Commonly known as:  RHEUMATREX  Take 6 mg by mouth once a week.     NUCYNTA 50 MG Tabs tablet  Generic drug:  tapentadol  Take 50 mg by mouth as needed.     potassium chloride SA 20 MEQ tablet  Commonly known as:  KLOR-CON M20  TAKE 2 TABLETS (40 MEQ TOTAL) BY MOUTH 3 (THREE) TIMES DAILY.     pravastatin 80 MG tablet  Commonly known as:  PRAVACHOL  Take 80 mg by mouth daily.     triamterene-hydrochlorothiazide 37.5-25 MG per tablet  Commonly known as:  MAXZIDE-25  Take 1 tablet by mouth daily.        Allergies:  Allergies  Allergen Reactions  . Acetaminophen     REACTION: unspecified  . Actos [Pioglitazone Hydrochloride]     edema  . Aspirin     REACTION: unspecified  . Atorvastatin     REACTION: unspecified  . Morphine Sulfate     REACTION: unspecified  . Naproxen Sodium     REACTION: unspecified  . Sitagliptin  Phosphate     REACTION: nausea    Past Medical History  Diagnosis Date  . Hypertension   . Diabetes mellitus     TYPE 2  . Hyperkalemia   . RA (rheumatoid arthritis)   . Insomnia   . Dyslipidemia   . Allergic rhinitis     Past Surgical History  Procedure Laterality Date  . Hand tendon surgery      Right    Family History  Problem Relation Age of Onset  . Diabetes Mother   . Diabetes Father   . Diabetes Sister     Social History:  reports that she has never smoked. She does not have any smokeless tobacco history on file. She reports that she does not drink alcohol or use illicit drugs.  Review of Systems -  Cardiovascular: positive for -  History of palpitations, possibly drug-related No known coronary artery disease  History of rheumatoid arthritis on treatment, not on steroids  Anemia: mild; is on iron, probably related to a combination of rheumatoid arthritis and iron deficiency as well as chronic disease  Vitamin D deficiency: Currently taking vitamin D 3, 1000 U daily    Examination:   BP 128/72  Pulse 73  Temp(Src) 98.2 F (36.8 C)  Resp 16  Ht 5\' 4"  (1.626 m)  Wt 164 lb 9.6 oz (74.662 kg)  BMI 28.24 kg/m2  SpO2 97%  Body mass index is 28.24 kg/(m^2).   Repeat blood pressure standing is unchanged  No pedal edema  Assesment/PLAN:  Hypertension: She is on a new 3 drug regimen of guanfacine, labetalol and doxazosin and recent blood pressure is much better.  Appears to be tolerating this combination better than before  No edema without her diuretic  Will continue this for now, not clear if she really needs to be on an ARB drug   3. Hypokalemia: She is being evaluated for primary hyperaldosteronism Her levels are pending Also will check her potassium today and decide on need for any supplementation since she is off diuretics May also consider using Aldactone if at any point potassium is low   Naydelin Ziegler 08/04/2014, 8:46 AM

## 2014-08-05 LAB — ALDOSTERONE + RENIN ACTIVITY W/ RATIO
ALDO / PRA Ratio: 28.6 Ratio (ref 0.9–28.9)
Aldosterone: 6 ng/dL
PRA LC/MS/MS: 0.21 ng/mL/h — ABNORMAL LOW (ref 0.25–5.82)

## 2014-08-05 NOTE — Progress Notes (Signed)
Quick Note:  Please let patient know that the lab result is normal, reduce potassium to 2 daily ______

## 2014-08-06 ENCOUNTER — Other Ambulatory Visit: Payer: Self-pay | Admitting: *Deleted

## 2014-08-06 MED ORDER — DOXAZOSIN MESYLATE 2 MG PO TABS: 2.0000 mg | ORAL_TABLET | Freq: Every day | ORAL | Status: DC

## 2014-08-07 ENCOUNTER — Other Ambulatory Visit: Payer: Self-pay

## 2014-08-07 MED ORDER — TRIAMTERENE-HCTZ 37.5-25 MG PO TABS: 1.0000 | ORAL_TABLET | Freq: Every day | ORAL | Status: DC

## 2014-08-28 ENCOUNTER — Other Ambulatory Visit: Payer: Self-pay | Admitting: Endocrinology

## 2014-08-31 ENCOUNTER — Other Ambulatory Visit: Payer: Self-pay | Admitting: Endocrinology

## 2014-09-02 ENCOUNTER — Other Ambulatory Visit: Payer: Self-pay | Admitting: Endocrinology

## 2014-09-10 ENCOUNTER — Other Ambulatory Visit (INDEPENDENT_AMBULATORY_CARE_PROVIDER_SITE_OTHER): Payer: Medicare Other

## 2014-09-10 DIAGNOSIS — E7800 Pure hypercholesterolemia, unspecified: Secondary | ICD-10-CM

## 2014-09-10 DIAGNOSIS — E876 Hypokalemia: Secondary | ICD-10-CM

## 2014-09-10 LAB — LIPID PANEL
Cholesterol: 149 mg/dL (ref 0–200)
HDL: 67.4 mg/dL (ref >39.00)
LDL Cholesterol: 71 mg/dL (ref 0–99)
NonHDL: 81.6
Total CHOL/HDL Ratio: 2
Triglycerides: 51 mg/dL (ref 0.0–149.0)
VLDL: 10.2 mg/dL (ref 0.0–40.0)

## 2014-09-10 LAB — COMPREHENSIVE METABOLIC PANEL
ALT: 26 U/L (ref 0–35)
AST: 26 U/L (ref 0–37)
Albumin: 3.8 g/dL (ref 3.5–5.2)
Alkaline Phosphatase: 71 U/L (ref 39–117)
BUN: 16 mg/dL (ref 6–23)
CO2: 27 mEq/L (ref 19–32)
Calcium: 9.5 mg/dL (ref 8.4–10.5)
Chloride: 103 mEq/L (ref 96–112)
Creatinine, Ser: 0.8 mg/dL (ref 0.4–1.2)
GFR: 96.4 mL/min (ref >60.00)
Glucose, Bld: 90 mg/dL (ref 70–99)
Potassium: 3.3 mEq/L — ABNORMAL LOW (ref 3.5–5.1)
Sodium: 138 mEq/L (ref 135–145)
Total Bilirubin: 0.7 mg/dL (ref 0.2–1.2)
Total Protein: 7.6 g/dL (ref 6.0–8.3)

## 2014-09-15 ENCOUNTER — Other Ambulatory Visit: Payer: Self-pay | Admitting: *Deleted

## 2014-09-15 ENCOUNTER — Encounter: Payer: Self-pay | Admitting: Endocrinology

## 2014-09-15 ENCOUNTER — Ambulatory Visit (INDEPENDENT_AMBULATORY_CARE_PROVIDER_SITE_OTHER): Payer: Medicare Other | Admitting: Endocrinology

## 2014-09-15 VITALS — BP 122/68 | HR 75 | Temp 98.3°F | Resp 14 | Ht 64.0 in | Wt 162.6 lb

## 2014-09-15 DIAGNOSIS — E119 Type 2 diabetes mellitus without complications: Secondary | ICD-10-CM

## 2014-09-15 DIAGNOSIS — E876 Hypokalemia: Secondary | ICD-10-CM

## 2014-09-15 DIAGNOSIS — I10 Essential (primary) hypertension: Secondary | ICD-10-CM

## 2014-09-15 MED ORDER — SPIRONOLACTONE 50 MG PO TABS: 50.0000 mg | ORAL_TABLET | Freq: Every day | ORAL | Status: DC

## 2014-09-15 MED ORDER — LABETALOL HCL 100 MG PO TABS: ORAL_TABLET | ORAL | Status: DC

## 2014-09-15 NOTE — Progress Notes (Signed)
Patient ID: April [name], female   DOB: 04-02-1941, 73 y.o.   MRN: [id number]   Reason for Appointment: follow-up   History of Present Illness   HYPOKALEMIA:  This has been a chronic problem and associated with hypertension which was previously difficult to control Previously had  been prescribed 6 tablets of potassium daily She was off her diuretics and Avapro when her evaluation for aldosterone was done, aldosterone level was 6.0 along with a relatively low renin level Her potassium was fairly good on her last visit but it is slightly low at 3.3 again even with taking 3 tablets of potassium  HYPERTENSION:  diagnosed around 1979 with symptoms of headaches  Previously she was taking Avapro, clonidine 0.6 at bedtime and Dyazide but blood pressure was relatively high with this regimen She does not monitor her blood pressure at home For evaluation of her hyperaldosteronism she was switched to labetalol 100 mg twice a day, Tenex 2 mg and doxazosin Her blood pressure appears to consistently good She says she is subjectively feeling better also  DIABETES: This has been fairly well controlled, she had blood sugar readings actively in the normal range ranging from 76-126 but mostly checked between breakfast and lunch with an average of 94 Currently on Glucophage alone     Lab Results  Component Value Date   HGBA1C 6.1 07/02/2014   HGBA1C 6.0 12/26/2013   HGBA1C 6.5 06/20/2013   Lab Results  Component Value Date   MICROALBUR 1.8 12/26/2013   LDLCALC 71 09/10/2014   CREATININE 0.8 09/10/2014     LABS:  Appointment on 09/10/2014  Component Date Value Ref Range Status  . Sodium 09/10/2014 138  135 - 145 mEq/L Final  . Potassium 09/10/2014 3.3* 3.5 - 5.1 mEq/L Final  . Chloride 09/10/2014 103  96 - 112 mEq/L Final  . CO2 09/10/2014 27  19 - 32 mEq/L Final  . Glucose, Bld 09/10/2014 90  70 - 99 mg/dL Final  . BUN 78/29/5621 16  6 - 23 mg/dL Final  . Creatinine, Ser 09/10/2014 0.8   0.4 - 1.2 mg/dL Final  . Total Bilirubin 09/10/2014 0.7  0.2 - 1.2 mg/dL Final  . Alkaline Phosphatase 09/10/2014 71  39 - 117 U/L Final  . AST 09/10/2014 26  0 - 37 U/L Final  . ALT 09/10/2014 26  0 - 35 U/L Final  . Total Protein 09/10/2014 7.6  6.0 - 8.3 g/dL Final  . Albumin 30/86/5784 3.8  3.5 - 5.2 g/dL Final  . Calcium 69/62/9528 9.5  8.4 - 10.5 mg/dL Final  . GFR 41/32/4401 96.40  >60.00 mL/min Final  . Cholesterol 09/10/2014 149  0 - 200 mg/dL Final   ATP III Classification       Desirable:  < 200 mg/dL               Borderline High:  200 - 239 mg/dL          High:  > = 027 mg/dL  . Triglycerides 09/10/2014 51.0  0.0 - 149.0 mg/dL Final   Normal:  <253 mg/dLBorderline High:  150 - 199 mg/dL  . HDL 09/10/2014 67.40  >39.00 mg/dL Final  . VLDL 66/44/0347 10.2  0.0 - 40.0 mg/dL Final  . LDL Cholesterol 09/10/2014 71  0 - 99 mg/dL Final  . Total CHOL/HDL Ratio 09/10/2014 2   Final                  Men  Women1/2 Average Risk     3.4          3.3Average Risk          5.0          4.42X Average Risk          9.6          7.13X Average Risk          15.0          11.0                      . NonHDL 09/10/2014 81.60   Final   NOTE:  Non-HDL goal should be 30 mg/dL higher than patient's LDL goal (i.e. LDL goal of < 70 mg/dL, would have non-HDL goal of < 100 mg/dL)      Medication List       This list is accurate as of: 09/15/14  9:28 AM.  Always use your most recent med list.               ACCU-CHEK COMPACT TEST DRUM test strip  Generic drug:  glucose blood     ACCU-CHEK SOFTCLIX LANCETS lancets     doxazosin 2 MG tablet  Commonly known as:  CARDURA  Take 1 tablet (2 mg total) by mouth at bedtime.     ezetimibe 10 MG tablet  Commonly known as:  ZETIA  Take 10 mg by mouth daily.     fexofenadine 180 MG tablet  Commonly known as:  ALLEGRA  Take 180 mg by mouth as needed.     fluticasone 50 MCG/ACT nasal spray  Commonly known as:  FLONASE  2 sprays by Nasal route  as needed.     folic acid 0.5 MG tablet  Commonly known as:  FOLVITE  Take 1 mg by mouth daily.     guanFACINE 2 MG Tb24 SR tablet  Commonly known as:  INTUNIV  TAKE 1 TABLET BY MOUTH EVERY DAY     irbesartan 300 MG tablet  Commonly known as:  AVAPRO  Take 300 mg by mouth at bedtime.     labetalol 100 MG tablet  Commonly known as:  NORMODYNE  TAKE 1 TABLET BY MOUTH TWICE A DAY     metFORMIN 1000 MG tablet  Commonly known as:  GLUCOPHAGE  TAKE 1 TABLET BY MOUTH TWICE A DAY     methotrexate 2.5 MG tablet  Commonly known as:  RHEUMATREX  Take 6 mg by mouth once a week.     NUCYNTA 50 MG Tabs tablet  Generic drug:  tapentadol  Take 50 mg by mouth as needed.     potassium chloride SA 20 MEQ tablet  Commonly known as:  KLOR-CON M20  TAKE 2 TABLETS (40 MEQ TOTAL) BY MOUTH 3 (THREE) TIMES DAILY.     pravastatin 80 MG tablet  Commonly known as:  PRAVACHOL  Take 80 mg by mouth daily.     triamterene-hydrochlorothiazide 37.5-25 MG per tablet  Commonly known as:  MAXZIDE-25  Take 1 tablet by mouth daily.        Allergies:  Allergies  Allergen Reactions  . Acetaminophen     REACTION: unspecified  . Actos [Pioglitazone Hydrochloride]     edema  . Aspirin     REACTION: unspecified  . Atorvastatin     REACTION: unspecified  . Morphine Sulfate     REACTION: unspecified  . Naproxen Sodium     REACTION: unspecified  .  Sitagliptin Phosphate     REACTION: nausea    Past Medical History  Diagnosis Date  . Hypertension   . Diabetes mellitus     TYPE 2  . Hyperkalemia   . RA (rheumatoid arthritis)   . Insomnia   . Dyslipidemia   . Allergic rhinitis     Past Surgical History  Procedure Laterality Date  . Hand tendon surgery      Right    Family History  Problem Relation Age of Onset  . Diabetes Mother   . Diabetes Father   . Diabetes Sister     Social History:  reports that she has never smoked. She does not have any smokeless tobacco history on file.  She reports that she does not drink alcohol or use illicit drugs.  Review of Systems -  Cardiovascular: positive for -  History of palpitations, possibly drug-related No known coronary artery disease  History of rheumatoid arthritis on treatment, not on steroids  Anemia: mild; is on iron, probably related to a combination of rheumatoid arthritis and iron deficiency as well as chronic disease  Vitamin D deficiency: Currently taking vitamin D 3, 1000 U daily    Examination:   BP 122/68  Pulse 75  Temp(Src) 98.3 F (36.8 C)  Resp 14  Ht 5\' 4"  (1.626 m)  Wt 162 lb 9.6 oz (73.755 kg)  BMI 27.90 kg/m2  SpO2 96%  Body mass index is 27.9 kg/(m^2).   Not indicated   Assesment/PLAN:  DIABETES: Well controlled  Hypertension: She is on a   3 drug regimen of guanfacine, labetalol and doxazosin and blood pressure  better controlled with this  This is even without the diuretic that she has no edema Hyperaldosteronism has been ruled out   Hypokalemia: She is still having tendency to low potassium and may be related to potassium wasting without any acid-base disturbance suggestive of RTA Since she needs at least 3 tablets of potassium daily may benefit from a trial of Aldactone She will take this for 2 weeks and have her potassium checked   Will stop potassium supplements after 5 days     April [name] 09/15/2014, 9:28 AM

## 2014-09-15 NOTE — Patient Instructions (Addendum)
Aldactone generic 50 mg daily  Potassium 2 daily for 5 days then stop   Stop Doxazosin  Please check blood sugars at least half the time about 2 hours after any meal and times 2-3 per week on waking up. Please bring blood sugar monitor to each visit

## 2014-09-25 ENCOUNTER — Other Ambulatory Visit: Payer: Medicare Other

## 2014-09-25 ENCOUNTER — Other Ambulatory Visit (INDEPENDENT_AMBULATORY_CARE_PROVIDER_SITE_OTHER): Payer: Medicare Other

## 2014-09-25 DIAGNOSIS — E119 Type 2 diabetes mellitus without complications: Secondary | ICD-10-CM

## 2014-09-25 DIAGNOSIS — E876 Hypokalemia: Secondary | ICD-10-CM

## 2014-09-25 LAB — HEMOGLOBIN A1C: Hgb A1c MFr Bld: 6.1 % (ref 4.6–6.5)

## 2014-09-26 LAB — BASIC METABOLIC PANEL
BUN: 14 mg/dL (ref 6–23)
CO2: 29 mEq/L (ref 19–32)
Calcium: 9.4 mg/dL (ref 8.4–10.5)
Chloride: 104 mEq/L (ref 96–112)
Creatinine, Ser: 0.9 mg/dL (ref 0.4–1.2)
GFR: 78.3 mL/min (ref >60.00)
Glucose, Bld: 85 mg/dL (ref 70–99)
Potassium: 3.4 mEq/L — ABNORMAL LOW (ref 3.5–5.1)
Sodium: 139 mEq/L (ref 135–145)

## 2014-09-26 LAB — MICROALBUMIN / CREATININE URINE RATIO
Creatinine,U: 248.6 mg/dL
Microalb Creat Ratio: 0.6 mg/g (ref 0.0–30.0)
Microalb, Ur: 1.5 mg/dL (ref 0.0–1.9)

## 2014-10-10 ENCOUNTER — Other Ambulatory Visit: Payer: Self-pay | Admitting: Endocrinology

## 2014-11-14 ENCOUNTER — Ambulatory Visit: Payer: Medicare Other | Admitting: Endocrinology

## 2014-11-17 ENCOUNTER — Other Ambulatory Visit: Payer: Self-pay | Admitting: Cardiovascular Disease

## 2014-11-17 ENCOUNTER — Other Ambulatory Visit: Payer: Self-pay | Admitting: Endocrinology

## 2014-11-20 ENCOUNTER — Other Ambulatory Visit (INDEPENDENT_AMBULATORY_CARE_PROVIDER_SITE_OTHER): Payer: Medicare Other

## 2014-11-20 DIAGNOSIS — E119 Type 2 diabetes mellitus without complications: Secondary | ICD-10-CM

## 2014-11-20 LAB — BASIC METABOLIC PANEL
BUN: 25 mg/dL — ABNORMAL HIGH (ref 6–23)
CO2: 24 mEq/L (ref 19–32)
Calcium: 10 mg/dL (ref 8.4–10.5)
Chloride: 100 mEq/L (ref 96–112)
Creatinine, Ser: 1 mg/dL (ref 0.4–1.2)
GFR: 73.58 mL/min (ref >60.00)
Glucose, Bld: 79 mg/dL (ref 70–99)
Potassium: 3.2 mEq/L — ABNORMAL LOW (ref 3.5–5.1)
Sodium: 137 mEq/L (ref 135–145)

## 2014-11-24 ENCOUNTER — Encounter: Payer: Self-pay | Admitting: Endocrinology

## 2014-11-24 ENCOUNTER — Ambulatory Visit (INDEPENDENT_AMBULATORY_CARE_PROVIDER_SITE_OTHER): Payer: Medicare Other | Admitting: Endocrinology

## 2014-11-24 VITALS — BP 180/95 | HR 96 | Temp 98.2°F | Resp 14 | Ht 64.0 in | Wt 160.0 lb

## 2014-11-24 DIAGNOSIS — E119 Type 2 diabetes mellitus without complications: Secondary | ICD-10-CM

## 2014-11-24 DIAGNOSIS — E876 Hypokalemia: Secondary | ICD-10-CM

## 2014-11-24 DIAGNOSIS — I10 Essential (primary) hypertension: Secondary | ICD-10-CM

## 2014-11-24 MED ORDER — SPIRONOLACTONE 100 MG PO TABS: 100.0000 mg | ORAL_TABLET | Freq: Every day | ORAL | Status: DC

## 2014-11-24 NOTE — Progress Notes (Signed)
Patient ID: April [name], female   DOB: 02-Jan-1941, 73 y.o.   MRN: [id number]   Reason for Appointment: follow-up of various problems  History of Present Illness   HYPOKALEMIA:  This has been a chronic problem and associated with hypertension which was previously difficult to control Previously had  been prescribed 6 tablets of potassium daily She was off her diuretics and Avapro when her evaluation for aldosterone was done, aldosterone level was 6.0 along with a relatively low renin level She was started on Aldactone 50 mg daily Her potassium was nearly normal on her last visit but it is  now low at 3.2 again without potassium  HYPERTENSION:  diagnosed around 1979 with symptoms of headaches  Previously she was taking Avapro, clonidine 0.6 at bedtime and Dyazide but blood pressure was relatively high with this regimen She does  monitor her blood pressure at home about 2 or 3 times a week For evaluation of her hyperaldosteronism she was switched to labetalol 100 mg twice a day, Tenex 2 mg and doxazosin With this her blood pressure appeared to have been improved However for some reason she misunderstood her instructions and has not taken her antihypertensive medications; is only taking Aldactone 50 mg Her blood pressure at home and reportedly 120-130 but her readings are significantly high today even though she thinks she is not anxious now  DIABETES: This has been  well controlled with a regimen of metformin only She is generally fairly good with diet and exercise regimen including walking A1c has been consistently upper normal Home blood sugar range = 85-168 with average 107 and only one high reading possibly after breakfast, checking blood sugars mostly in the mornings and afternoons  Wt Readings from Last 3 Encounters:  11/24/14 160 lb (72.576 kg)  09/15/14 162 lb 9.6 oz (73.755 kg)  08/04/14 164 lb 9.6 oz (74.662 kg)      Lab Results  Component Value Date   HGBA1C 6.1  09/25/2014   HGBA1C 6.1 07/02/2014   HGBA1C 6.0 12/26/2013   Lab Results  Component Value Date   MICROALBUR 1.5 09/25/2014   LDLCALC 71 09/10/2014   CREATININE 1.0 11/20/2014     LABS:  Appointment on 11/20/2014  Component Date Value Ref Range Status  . Sodium 11/20/2014 137  135 - 145 mEq/L Final  . Potassium 11/20/2014 3.2* 3.5 - 5.1 mEq/L Final  . Chloride 11/20/2014 100  96 - 112 mEq/L Final  . CO2 11/20/2014 24  19 - 32 mEq/L Final  . Glucose, Bld 11/20/2014 79  70 - 99 mg/dL Final  . BUN 17/51/0258 25* 6 - 23 mg/dL Final  . Creatinine, Ser 11/20/2014 1.0  0.4 - 1.2 mg/dL Final  . Calcium 52/77/8242 10.0  8.4 - 10.5 mg/dL Final  . GFR 35/36/1443 73.58  >60.00 mL/min Final      Medication List       This list is accurate as of: 11/24/14 12:04 PM.  Always use your most recent med list.               ACCU-CHEK COMPACT TEST DRUM test strip  Generic drug:  glucose blood     ACCU-CHEK SOFTCLIX LANCETS lancets     doxazosin 2 MG tablet  Commonly known as:  CARDURA  Take 1 tablet (2 mg total) by mouth at bedtime.     ezetimibe 10 MG tablet  Commonly known as:  ZETIA  Take 10 mg by mouth daily.     fexofenadine  180 MG tablet  Commonly known as:  ALLEGRA  Take 180 mg by mouth as needed.     fluticasone 50 MCG/ACT nasal spray  Commonly known as:  FLONASE  2 sprays by Nasal route as needed.     folic acid 0.5 MG tablet  Commonly known as:  FOLVITE  Take 1 mg by mouth daily.     guanFACINE 2 MG Tb24 SR tablet  Commonly known as:  INTUNIV  TAKE 1 TABLET BY MOUTH EVERY DAY     irbesartan 300 MG tablet  Commonly known as:  AVAPRO  Take 300 mg by mouth at bedtime.     KLOR-CON M20 20 MEQ tablet  Generic drug:  potassium chloride SA  TAKE 2 TABLETS BY MOUTH THREE TIMES DAILY.     labetalol 100 MG tablet  Commonly known as:  NORMODYNE  TAKE 1 TABLET BY MOUTH TWICE A DAY     metFORMIN 1000 MG tablet  Commonly known as:  GLUCOPHAGE  TAKE 1 TABLET BY  MOUTH TWICE A DAY     methotrexate 2.5 MG tablet  Commonly known as:  RHEUMATREX  Take 6 mg by mouth once a week.     NUCYNTA 50 MG Tabs tablet  Generic drug:  tapentadol  Take 50 mg by mouth as needed.     pravastatin 80 MG tablet  Commonly known as:  PRAVACHOL  Take 80 mg by mouth daily.     spironolactone 50 MG tablet  Commonly known as:  ALDACTONE  Take 1 tablet (50 mg total) by mouth daily.     triamterene-hydrochlorothiazide 37.5-25 MG per tablet  Commonly known as:  MAXZIDE-25  TAKE 1 TABLET BY MOUTH DAILY.        Allergies:  Allergies  Allergen Reactions  . Acetaminophen     REACTION: unspecified  . Actos [Pioglitazone Hydrochloride]     edema  . Aspirin     REACTION: unspecified  . Atorvastatin     REACTION: unspecified  . Morphine Sulfate     REACTION: unspecified  . Naproxen Sodium     REACTION: unspecified  . Sitagliptin Phosphate     REACTION: nausea    Past Medical History  Diagnosis Date  . Hypertension   . Diabetes mellitus     TYPE 2  . Hyperkalemia   . RA (rheumatoid arthritis)   . Insomnia   . Dyslipidemia   . Allergic rhinitis     Past Surgical History  Procedure Laterality Date  . Hand tendon surgery      Right    Family History  Problem Relation Age of Onset  . Diabetes Mother   . Diabetes Father   . Diabetes Sister     Social History:  reports that she has never smoked. She does not have any smokeless tobacco history on file. She reports that she does not drink alcohol or use illicit drugs.  Review of Systems   Cardiovascular:  History of palpitations, possibly drug-related No known coronary artery disease  History of rheumatoid arthritis on treatment, not on steroids, still having some pain Also to see orthopedic surgeon  Anemia: mild; is on iron, probably related to a combination of rheumatoid arthritis and iron deficiency as well as chronic disease  Vitamin D deficiency: Currently taking vitamin D 3, 1000 U  daily    Examination:   BP 180/95 mmHg  Pulse 96  Temp(Src) 98.2 F (36.8 C)  Resp 14  Ht 5\' 4"  (1.626 m)  Wt  160 lb (72.576 kg)  BMI 27.45 kg/m2  SpO2 98%  Body mass index is 27.45 kg/(m^2).   Repeat blood pressure 164/90 No ankle edema  Assesment/PLAN:  DIABETES: Well controlled  Hypertension: She is on a   3 drug regimen of guanfacine, labetalol and doxazosin and blood pressure was  better controlled with this  However she misunderstood her instructions and is not taking these medications Currently taking only Aldactone and blood pressure is significantly higher Unlikely that her home blood pressure monitor is accurate given though she thinks blood pressure is better at home Hyperaldosteronism has been ruled out  Recommended that she at least go back to her labetalol and will see if her blood pressure is better with 100 mg Aldactone  Hypokalemia: She is still having tendency to low potassium and may be related to potassium wasting without any acid-base disturbance suggestive of RTA Since she still has a low potassium with Aldactone 50 mg Will try 100 mg She will take potassium supplement only for the next week She will need short-term follow-up     Zadaya Cuadra 11/24/2014, 12:04 PM

## 2014-11-24 NOTE — Patient Instructions (Signed)
Restart Labetalol twice daily  Increase Spironolactone to 100mg  or 2 of 50  Take potassium 1/ day for 7 days  Bring BP monitor to office

## 2014-11-26 LAB — HM DIABETES EYE EXAM

## 2014-12-04 ENCOUNTER — Other Ambulatory Visit: Payer: Self-pay | Admitting: Endocrinology

## 2014-12-15 ENCOUNTER — Other Ambulatory Visit: Payer: Medicare Other

## 2014-12-15 ENCOUNTER — Other Ambulatory Visit (INDEPENDENT_AMBULATORY_CARE_PROVIDER_SITE_OTHER): Payer: Self-pay

## 2014-12-15 DIAGNOSIS — E119 Type 2 diabetes mellitus without complications: Secondary | ICD-10-CM

## 2014-12-15 LAB — COMPREHENSIVE METABOLIC PANEL
ALT: 19 U/L (ref 0–35)
AST: 23 U/L (ref 0–37)
Albumin: 4.3 g/dL (ref 3.5–5.2)
Alkaline Phosphatase: 65 U/L (ref 39–117)
BUN: 16 mg/dL (ref 6–23)
CO2: 26 mEq/L (ref 19–32)
Calcium: 9.6 mg/dL (ref 8.4–10.5)
Chloride: 101 mEq/L (ref 96–112)
Creatinine, Ser: 1 mg/dL (ref 0.4–1.2)
GFR: 69.38 mL/min (ref >60.00)
Glucose, Bld: 89 mg/dL (ref 70–99)
Potassium: 3.5 mEq/L (ref 3.5–5.1)
Sodium: 136 mEq/L (ref 135–145)
Total Bilirubin: 0.7 mg/dL (ref 0.2–1.2)
Total Protein: 7.5 g/dL (ref 6.0–8.3)

## 2014-12-15 LAB — HEMOGLOBIN A1C: Hgb A1c MFr Bld: 6.3 % (ref 4.6–6.5)

## 2014-12-18 ENCOUNTER — Encounter: Payer: Self-pay | Admitting: Endocrinology

## 2014-12-18 ENCOUNTER — Ambulatory Visit (INDEPENDENT_AMBULATORY_CARE_PROVIDER_SITE_OTHER): Payer: Medicare Other | Admitting: Endocrinology

## 2014-12-18 VITALS — BP 137/81 | HR 76 | Temp 98.2°F | Resp 14 | Ht 64.0 in | Wt 158.0 lb

## 2014-12-18 DIAGNOSIS — E119 Type 2 diabetes mellitus without complications: Secondary | ICD-10-CM

## 2014-12-18 DIAGNOSIS — E269 Hyperaldosteronism, unspecified: Secondary | ICD-10-CM

## 2014-12-18 DIAGNOSIS — M792 Neuralgia and neuritis, unspecified: Secondary | ICD-10-CM

## 2014-12-18 DIAGNOSIS — E876 Hypokalemia: Secondary | ICD-10-CM

## 2014-12-18 MED ORDER — POTASSIUM CHLORIDE CRYS ER 20 MEQ PO TBCR: 20.0000 meq | EXTENDED_RELEASE_TABLET | Freq: Once | ORAL | Status: DC

## 2014-12-18 MED ORDER — GABAPENTIN 100 MG PO CAPS: 100.0000 mg | ORAL_CAPSULE | Freq: Two times a day (BID) | ORAL | Status: DC

## 2014-12-18 NOTE — Progress Notes (Signed)
Patient ID: April [name], female   DOB: [birth year], 74 y.o.   MRN: [id number]   Reason for Appointment: follow-up of various problems  History of Present Illness    HYPERTENSION:  diagnosed around 1979 with symptoms of headaches Previously she was taking Avapro, clonidine 0.6 at bedtime and Dyazide but blood pressure was relatively high with this regimen For evaluation of her hyperaldosteronism she was switched to labetalol 100 mg twice a day, Tenex 2 mg and doxazosin With this her blood pressure was much better She was subsequently started on Aldactone to help with her hypokalemia On her last visit she was not taking her antihypertensive medications by mistake and blood pressure was significantly high However she has restarted all her medications and has continued Aldactone which has been increased to 100 mg She has checked her blood pressure at the drug store and it is usually fairly good now, usually 120-130 systolic  HYPOKALEMIA:  This has been a chronic problem and associated with hypertension which was previously difficult to control Previously had  been prescribed 6 tablets of potassium daily She was off her diuretics and Avapro when her evaluation for aldosterone was done, aldosterone level was 6.0 along with a relatively low renin level She has a better level now with Aldactone 100 mg daily Currently not on potassium supplements  DIABETES: This has been  well controlled with a regimen of metformin only She is generally fairly good with diet and exercise regimen Now walking twice daily Has been able to keep her weight down also A1c has been consistently upper normal Home blood sugar range = 87-136   Wt Readings from Last 3 Encounters:  12/18/14 158 lb (71.668 kg)  11/24/14 160 lb (72.576 kg)  09/15/14 162 lb 9.6 oz (73.755 kg)      Lab Results  Component Value Date   HGBA1C 6.3 12/15/2014   HGBA1C 6.1 09/25/2014   HGBA1C 6.1 07/02/2014   Lab Results   Component Value Date   MICROALBUR 1.5 09/25/2014   LDLCALC 71 09/10/2014   CREATININE 1.0 12/15/2014     LABS:  Appointment on 12/15/2014  Component Date Value Ref Range Status  . Hgb A1c MFr Bld 12/15/2014 6.3  4.6 - 6.5 % Final   Glycemic Control Guidelines for People with Diabetes:Non Diabetic:  <6%Goal of Therapy: <7%Additional Action Suggested:  >8%   . Sodium 12/15/2014 136  135 - 145 mEq/L Final  . Potassium 12/15/2014 3.5  3.5 - 5.1 mEq/L Final  . Chloride 12/15/2014 101  96 - 112 mEq/L Final  . CO2 12/15/2014 26  19 - 32 mEq/L Final  . Glucose, Bld 12/15/2014 89  70 - 99 mg/dL Final  . BUN 82/57/4935 16  6 - 23 mg/dL Final  . Creatinine, Ser 12/15/2014 1.0  0.4 - 1.2 mg/dL Final  . Total Bilirubin 12/15/2014 0.7  0.2 - 1.2 mg/dL Final  . Alkaline Phosphatase 12/15/2014 65  39 - 117 U/L Final  . AST 12/15/2014 23  0 - 37 U/L Final  . ALT 12/15/2014 19  0 - 35 U/L Final  . Total Protein 12/15/2014 7.5  6.0 - 8.3 g/dL Final  . Albumin 52/17/4715 4.3  3.5 - 5.2 g/dL Final  . Calcium 95/39/6728 9.6  8.4 - 10.5 mg/dL Final  . GFR 97/91/5041 69.38  >60.00 mL/min Final      Medication List       This list is accurate as of: 12/18/14  8:26 AM.  Always use your  most recent med list.               ACCU-CHEK COMPACT TEST DRUM test strip  Generic drug:  glucose blood     ACCU-CHEK SOFTCLIX LANCETS lancets     doxazosin 2 MG tablet  Commonly known as:  CARDURA  Take 1 tablet (2 mg total) by mouth at bedtime.     ezetimibe 10 MG tablet  Commonly known as:  ZETIA  Take 10 mg by mouth daily.     fexofenadine 180 MG tablet  Commonly known as:  ALLEGRA  Take 180 mg by mouth as needed.     fluticasone 50 MCG/ACT nasal spray  Commonly known as:  FLONASE  2 sprays by Nasal route as needed.     folic acid 0.5 MG tablet  Commonly known as:  FOLVITE  Take 1 mg by mouth daily.     guanFACINE 2 MG Tb24 SR tablet  Commonly known as:  INTUNIV  TAKE 1 TABLET BY MOUTH  EVERY DAY     KLOR-CON M20 20 MEQ tablet  Generic drug:  potassium chloride SA  TAKE 2 TABLETS BY MOUTH THREE TIMES DAILY.     labetalol 100 MG tablet  Commonly known as:  NORMODYNE  TAKE 1 TABLET BY MOUTH TWICE A DAY     metFORMIN 1000 MG tablet  Commonly known as:  GLUCOPHAGE  TAKE 1 TABLET BY MOUTH TWICE A DAY     methotrexate 2.5 MG tablet  Commonly known as:  RHEUMATREX  Take 6 mg by mouth once a week.     NUCYNTA 50 MG Tabs tablet  Generic drug:  tapentadol  Take 50 mg by mouth as needed.     pravastatin 80 MG tablet  Commonly known as:  PRAVACHOL  Take 80 mg by mouth daily.     spironolactone 100 MG tablet  Commonly known as:  ALDACTONE  Take 1 tablet (100 mg total) by mouth daily.     traMADol 50 MG tablet  Commonly known as:  ULTRAM     triamterene-hydrochlorothiazide 37.5-25 MG per tablet  Commonly known as:  MAXZIDE-25  Take 1 tablet by mouth daily.        Allergies:  Allergies  Allergen Reactions  . Acetaminophen     REACTION: unspecified  . Actos [Pioglitazone Hydrochloride]     edema  . Aspirin     REACTION: unspecified  . Atorvastatin     REACTION: unspecified  . Morphine Sulfate     REACTION: unspecified  . Naproxen Sodium     REACTION: unspecified  . Sitagliptin Phosphate     REACTION: nausea    Past Medical History  Diagnosis Date  . Hypertension   . Diabetes mellitus     TYPE 2  . Hyperkalemia   . RA (rheumatoid arthritis)   . Insomnia   . Dyslipidemia   . Allergic rhinitis     Past Surgical History  Procedure Laterality Date  . Hand tendon surgery      Right    Family History  Problem Relation Age of Onset  . Diabetes Mother   . Diabetes Father   . Diabetes Sister     Social History:  reports that she has never smoked. She does not have any smokeless tobacco history on file. She reports that she does not drink alcohol or use illicit drugs.  Review of Systems   Asking about sharp pains in Rt lower leg and  lateral posterior thigh in  a localized area without radiation down the whole leg and no pain in the back area.  No neurological symptoms in her feet and no symptoms on her left side She thinks that sometimes this occurs at the same time as her right shoulder pain  Cardiovascular:  History of palpitations, none recently  History of rheumatoid arthritis on treatment, not on steroids, still having some pain  Anemia: mild; is on iron, probably related to a combination of rheumatoid arthritis and iron deficiency as well as chronic disease  Vitamin D deficiency: Currently taking vitamin D 3, 1000 U daily    Examination:   BP 137/81 mmHg  Pulse 76  Temp(Src) 98.2 F (36.8 C)  Resp 14  Ht 5\' 4"  (1.626 m)  Wt 158 lb (71.668 kg)  BMI 27.11 kg/m2  SpO2 97%  Body mass index is 27.11 kg/(m^2).    No ankle edema.  No swelling of her knee or ankle joints  Assesment/PLAN:  DIABETES: Well controlled and A1c is about the same at 6.3 She is on metformin alone and can continue She is doing very well with diet and exercise regimen and most of her blood sugars are fairly good at home although she did not bring her monitor for download today  LEG pains: These appear to be neuropathic in nature and does not appear to be sciatica. She can try gabapentin empirically for now and if not better follow-up with her PCP or rheumatologist  Hypertension: She is on a   3 drug regimen of guanfacine, labetalol and doxazosin and blood pressure is  better controlled with this  Also taking Aldactone which probably is helping also and she will continue the same regimen  Hypokalemia: She is now having a fairly good potassium level despite stopping her large dose potassium supplements She does not have hyperaldosteronism She will continue her 100 mg of Aldactone which appears to be effective Since potassium is still 3.5 she can try taking 20 meq potassium daily   April [name] 12/18/2014, 8:26 AM

## 2014-12-25 ENCOUNTER — Other Ambulatory Visit: Payer: Self-pay

## 2014-12-25 MED ORDER — METFORMIN HCL 1000 MG PO TABS: 1000.0000 mg | ORAL_TABLET | Freq: Two times a day (BID) | ORAL | Status: DC

## 2014-12-25 MED ORDER — SPIRONOLACTONE 100 MG PO TABS: 100.0000 mg | ORAL_TABLET | Freq: Every day | ORAL | Status: DC

## 2014-12-25 MED ORDER — LABETALOL HCL 100 MG PO TABS: ORAL_TABLET | ORAL | Status: DC

## 2014-12-25 MED ORDER — POTASSIUM CHLORIDE CRYS ER 20 MEQ PO TBCR: 20.0000 meq | EXTENDED_RELEASE_TABLET | Freq: Once | ORAL | Status: DC

## 2014-12-25 MED ORDER — GUANFACINE HCL ER 2 MG PO TB24: 2.0000 mg | ORAL_TABLET | Freq: Every day | ORAL | Status: DC

## 2014-12-29 ENCOUNTER — Other Ambulatory Visit: Payer: Self-pay | Admitting: *Deleted

## 2014-12-29 MED ORDER — PRAVASTATIN SODIUM 80 MG PO TABS: 80.0000 mg | ORAL_TABLET | Freq: Every day | ORAL | Status: DC

## 2014-12-29 MED ORDER — METFORMIN HCL 1000 MG PO TABS: 1000.0000 mg | ORAL_TABLET | Freq: Two times a day (BID) | ORAL | Status: DC

## 2014-12-29 MED ORDER — POTASSIUM CHLORIDE CRYS ER 20 MEQ PO TBCR: EXTENDED_RELEASE_TABLET | ORAL | Status: DC

## 2014-12-29 MED ORDER — LABETALOL HCL 100 MG PO TABS: ORAL_TABLET | ORAL | Status: DC

## 2015-01-15 ENCOUNTER — Other Ambulatory Visit: Payer: Self-pay | Admitting: *Deleted

## 2015-01-21 ENCOUNTER — Telehealth: Payer: Self-pay | Admitting: Endocrinology

## 2015-01-21 NOTE — Telephone Encounter (Signed)
Patient would like her Rx sent to mail order   Rx: [facility]  Rushie Goltz

## 2015-01-21 NOTE — Telephone Encounter (Signed)
Patient called stating she would like her Rx now sent to mail order   Rx:

## 2015-01-26 ENCOUNTER — Other Ambulatory Visit: Payer: Self-pay | Admitting: *Deleted

## 2015-01-26 MED ORDER — DOXAZOSIN MESYLATE 2 MG PO TABS: 2.0000 mg | ORAL_TABLET | Freq: Every day | ORAL | Status: DC

## 2015-03-16 ENCOUNTER — Other Ambulatory Visit: Payer: Medicare Other

## 2015-03-17 ENCOUNTER — Other Ambulatory Visit (INDEPENDENT_AMBULATORY_CARE_PROVIDER_SITE_OTHER): Payer: Self-pay

## 2015-03-17 DIAGNOSIS — E119 Type 2 diabetes mellitus without complications: Secondary | ICD-10-CM

## 2015-03-17 LAB — COMPREHENSIVE METABOLIC PANEL WITH GFR
ALT: 28 U/L (ref 0–35)
AST: 29 U/L (ref 0–37)
Albumin: 4.3 g/dL (ref 3.5–5.2)
Alkaline Phosphatase: 75 U/L (ref 39–117)
BUN: 22 mg/dL (ref 6–23)
CO2: 26 meq/L (ref 19–32)
Calcium: 9.9 mg/dL (ref 8.4–10.5)
Chloride: 103 meq/L (ref 96–112)
Creatinine, Ser: 0.96 mg/dL (ref 0.40–1.20)
GFR: 73.52 mL/min
Glucose, Bld: 91 mg/dL (ref 70–99)
Potassium: 3.6 meq/L (ref 3.5–5.1)
Sodium: 138 meq/L (ref 135–145)
Total Bilirubin: 0.6 mg/dL (ref 0.2–1.2)
Total Protein: 7.4 g/dL (ref 6.0–8.3)

## 2015-03-17 LAB — MICROALBUMIN / CREATININE URINE RATIO
Creatinine,U: 266.1 mg/dL
Microalb Creat Ratio: 0.9 mg/g (ref 0.0–30.0)
Microalb, Ur: 2.3 mg/dL — ABNORMAL HIGH (ref 0.0–1.9)

## 2015-03-17 LAB — URINALYSIS, ROUTINE W REFLEX MICROSCOPIC
Bilirubin Urine: NEGATIVE
Ketones, ur: NEGATIVE
Leukocytes, UA: NEGATIVE
Nitrite: NEGATIVE
Specific Gravity, Urine: >=1.03 — AB
Total Protein, Urine: NEGATIVE
Urine Glucose: NEGATIVE
Urobilinogen, UA: 0.2
pH: 5.5 (ref 5.0–8.0)

## 2015-03-17 LAB — LIPID PANEL
Cholesterol: 180 mg/dL (ref 0–200)
HDL: 76.4 mg/dL
LDL Cholesterol: 93 mg/dL (ref 0–99)
NonHDL: 103.6
Total CHOL/HDL Ratio: 2
Triglycerides: 52 mg/dL (ref 0.0–149.0)
VLDL: 10.4 mg/dL (ref 0.0–40.0)

## 2015-03-17 LAB — HEMOGLOBIN A1C: Hgb A1c MFr Bld: 6.1 % (ref 4.6–6.5)

## 2015-03-19 ENCOUNTER — Ambulatory Visit (INDEPENDENT_AMBULATORY_CARE_PROVIDER_SITE_OTHER): Payer: Medicare Other | Admitting: Endocrinology

## 2015-03-19 ENCOUNTER — Encounter: Payer: Self-pay | Admitting: Endocrinology

## 2015-03-19 VITALS — BP 118/60 | HR 63 | Temp 97.6°F | Ht 64.0 in | Wt 161.5 lb

## 2015-03-19 DIAGNOSIS — I10 Essential (primary) hypertension: Secondary | ICD-10-CM

## 2015-03-19 DIAGNOSIS — E1142 Type 2 diabetes mellitus with diabetic polyneuropathy: Secondary | ICD-10-CM

## 2015-03-19 DIAGNOSIS — E876 Hypokalemia: Secondary | ICD-10-CM

## 2015-03-19 NOTE — Patient Instructions (Signed)
Please check blood sugars at least half the time about 2 hours after any meal and 1-2 times per week on waking up.  Please bring blood sugar monitor to each visit. Recommended blood sugar levels about 2 hours after meal is 140-180 and on waking up 90-130  

## 2015-03-19 NOTE — Progress Notes (Signed)
Patient ID: April [name], female   DOB: 1941-10-26, 74 y.o.   MRN: [id number]   Reason for Appointment: follow-up of various problems  History of Present Illness    HYPERTENSION:  diagnosed around 1979 with symptoms of headaches Previously she was taking Avapro, clonidine 0.6 at bedtime and Dyazide but blood pressure was relatively high with this regimen For evaluation of her hyperaldosteronism she was switched to labetalol 100 mg twice a day, Tenex 2 mg and doxazosin With this her blood pressure was much better She was subsequently started on Aldactone to help with her severe hypokalemia On her last visit she was not taking her antihypertensive medications by mistake and blood pressure was significantly high However she has restarted all her medications and has continued Aldactone which has been increased to 100 mg  She has checked her blood pressure at the drug store and it is usually fairly good now, usually 120-130 systolic  HYPOKALEMIA:  This has been a chronic problem and associated with hypertension which was previously difficult to control Previously had  been prescribed 6 tablets of potassium daily She was off her diuretics and Avapro when her evaluation for aldosterone was done, aldosterone level was 6.0 along with a relatively low renin level  She has a normal potassium level now with Aldactone 100 mg daily Currently  on only one potassium tablet daily  Lab Results  Component Value Date   CREATININE 0.96 03/17/2015   BUN 22 03/17/2015   NA 138 03/17/2015   K 3.6 03/17/2015   CL 103 03/17/2015   CO2 26 03/17/2015     DIABETES: This has been  well controlled with a regimen of metformin only She is generally fairly good with diet and exercise regimen Now walking regularly daily Recent minimal increase in weight A1c has been consistently upper normal Home blood sugar range = 25-127 with average 101   Wt Readings from Last 3 Encounters:  03/19/15 161 lb 8 oz  (73.256 kg)  12/18/14 158 lb (71.668 kg)  11/24/14 160 lb (72.576 kg)      Lab Results  Component Value Date   HGBA1C 6.1 03/17/2015   HGBA1C 6.3 12/15/2014   HGBA1C 6.1 09/25/2014   Lab Results  Component Value Date   MICROALBUR 2.3* 03/17/2015   LDLCALC 93 03/17/2015   CREATININE 0.96 03/17/2015     LABS:  Appointment on 03/17/2015  Component Date Value Ref Range Status  . Hgb A1c MFr Bld 03/17/2015 6.1  4.6 - 6.5 % Final   Glycemic Control Guidelines for People with Diabetes:Non Diabetic:  <6%Goal of Therapy: <7%Additional Action Suggested:  >8%   . Sodium 03/17/2015 138  135 - 145 mEq/L Final  . Potassium 03/17/2015 3.6  3.5 - 5.1 mEq/L Final  . Chloride 03/17/2015 103  96 - 112 mEq/L Final  . CO2 03/17/2015 26  19 - 32 mEq/L Final  . Glucose, Bld 03/17/2015 91  70 - 99 mg/dL Final  . BUN 78/29/5621 22  6 - 23 mg/dL Final  . Creatinine, Ser 03/17/2015 0.96  0.40 - 1.20 mg/dL Final  . Total Bilirubin 03/17/2015 0.6  0.2 - 1.2 mg/dL Final  . Alkaline Phosphatase 03/17/2015 75  39 - 117 U/L Final  . AST 03/17/2015 29  0 - 37 U/L Final  . ALT 03/17/2015 28  0 - 35 U/L Final  . Total Protein 03/17/2015 7.4  6.0 - 8.3 g/dL Final  . Albumin 30/86/5784 4.3  3.5 - 5.2 g/dL Final  .  Calcium 03/17/2015 9.9  8.4 - 10.5 mg/dL Final  . GFR 01/04/4387 73.52  >60.00 mL/min Final  . Cholesterol 03/17/2015 180  0 - 200 mg/dL Final   ATP III Classification       Desirable:  < 200 mg/dL               Borderline High:  200 - 239 mg/dL          High:  > = 875 mg/dL  . Triglycerides 03/17/2015 52.0  0.0 - 149.0 mg/dL Final   Normal:  <797 mg/dLBorderline High:  150 - 199 mg/dL  . HDL 03/17/2015 76.40  >39.00 mg/dL Final  . VLDL 28/20/6015 10.4  0.0 - 40.0 mg/dL Final  . LDL Cholesterol 03/17/2015 93  0 - 99 mg/dL Final  . Total CHOL/HDL Ratio 03/17/2015 2   Final                  Men          Women1/2 Average Risk     3.4          3.3Average Risk          5.0          4.42X Average Risk           9.6          7.13X Average Risk          15.0          11.0                      . NonHDL 03/17/2015 103.60   Final   NOTE:  Non-HDL goal should be 30 mg/dL higher than patient's LDL goal (i.e. LDL goal of < 70 mg/dL, would have non-HDL goal of < 100 mg/dL)  . Microalb, Ur 03/17/2015 2.3* 0.0 - 1.9 mg/dL Final  . Creatinine,U 61/53/7943 266.1   Final  . Microalb Creat Ratio 03/17/2015 0.9  0.0 - 30.0 mg/g Final  . Color, Urine 03/17/2015 YELLOW  Yellow;Lt. Yellow Final  . APPearance 03/17/2015 CLEAR  Clear Final  . Specific Gravity, Urine 03/17/2015 >=1.030* 1.000 - 1.030 Final  . pH 03/17/2015 5.5  5.0 - 8.0 Final  . Total Protein, Urine 03/17/2015 NEGATIVE  Negative Final  . Urine Glucose 03/17/2015 NEGATIVE  Negative Final  . Ketones, ur 03/17/2015 NEGATIVE  Negative Final  . Bilirubin Urine 03/17/2015 NEGATIVE  Negative Final  . Hgb urine dipstick 03/17/2015 TRACE-INTACT* Negative Final  . Urobilinogen, UA 03/17/2015 0.2  0.0 - 1.0 Final  . Leukocytes, UA 03/17/2015 NEGATIVE  Negative Final  . Nitrite 03/17/2015 NEGATIVE  Negative Final  . WBC, UA 03/17/2015 0-2/hpf  0-2/hpf Final  . RBC / HPF 03/17/2015 0-2/hpf  0-2/hpf Final  . Mucus, UA 03/17/2015 Presence of* None Final  . Squamous Epithelial / LPF 03/17/2015 Rare(0-4/hpf)  Rare(0-4/hpf) Final      Medication List       This list is accurate as of: 03/19/15  8:53 AM.  Always use your most recent med list.               ACCU-CHEK COMPACT TEST DRUM test strip  Generic drug:  glucose blood     ACCU-CHEK SOFTCLIX LANCETS lancets     doxazosin 2 MG tablet  Commonly known as:  CARDURA  Take 1 tablet (2 mg total) by mouth at bedtime.     ezetimibe 10 MG tablet  Commonly known  as:  ZETIA  Take 10 mg by mouth daily.     fexofenadine 180 MG tablet  Commonly known as:  ALLEGRA  Take 180 mg by mouth as needed.     fluticasone 50 MCG/ACT nasal spray  Commonly known as:  FLONASE  2 sprays by Nasal route as  needed.     folic acid 0.5 MG tablet  Commonly known as:  FOLVITE  Take 1 mg by mouth daily.     gabapentin 100 MG capsule  Commonly known as:  NEURONTIN  Take 1 capsule (100 mg total) by mouth 2 (two) times daily.     guanFACINE 2 MG Tb24 SR tablet  Commonly known as:  INTUNIV  Take 1 tablet (2 mg total) by mouth daily.     labetalol 100 MG tablet  Commonly known as:  NORMODYNE  TAKE 1 TABLET BY MOUTH TWICE A DAY     metFORMIN 1000 MG tablet  Commonly known as:  GLUCOPHAGE  Take 1 tablet (1,000 mg total) by mouth 2 (two) times daily.     methotrexate 2.5 MG tablet  Commonly known as:  RHEUMATREX  Take 6 mg by mouth once a week.     NUCYNTA 50 MG Tabs tablet  Generic drug:  tapentadol  Take 50 mg by mouth as needed.     potassium chloride SA 20 MEQ tablet  Commonly known as:  KLOR-CON M20  Take 1 tablet by mouth once a day     spironolactone 100 MG tablet  Commonly known as:  ALDACTONE  Take 1 tablet (100 mg total) by mouth daily.        Allergies:  Allergies  Allergen Reactions  . Acetaminophen     REACTION: unspecified  . Actos [Pioglitazone Hydrochloride]     edema  . Aspirin     REACTION: unspecified  . Atorvastatin     REACTION: unspecified  . Morphine Sulfate     REACTION: unspecified  . Naproxen Sodium     REACTION: unspecified  . Sitagliptin Phosphate     REACTION: nausea    Past Medical History  Diagnosis Date  . Hypertension   . Diabetes mellitus     TYPE 2  . Hyperkalemia   . RA (rheumatoid arthritis)   . Insomnia   . Dyslipidemia   . Allergic rhinitis     Past Surgical History  Procedure Laterality Date  . Hand tendon surgery      Right    Family History  Problem Relation Age of Onset  . Diabetes Mother   . Diabetes Father   . Diabetes Sister     Social History:  reports that she has never smoked. She does not have any smokeless tobacco history on file. She reports that she does not drink alcohol or use illicit  drugs.  Review of Systems   Still having  sharp pains in Rt lower leg and lateral posterior thigh in a localized area without radiation down the whole leg and no pain in the back area.   No neurological symptoms in her feet.  Most of the symptoms are when she is walking a lot  History of rheumatoid arthritis on treatment, not on steroids currently  Anemia: mild; is on iron, probably related to a combination of rheumatoid arthritis and iron deficiency as well as chronic disease  Vitamin D deficiency: Currently taking vitamin D 3, 1000 U daily    Examination:   BP 118/60 mmHg  Pulse 63  Temp(Src) 97.6  F (36.4 C) (Oral)  Ht 5\' 4"  (1.626 m)  Wt 161 lb 8 oz (73.256 kg)  BMI 27.71 kg/m2  SpO2 97%  Body mass index is 27.71 kg/(m^2).    No ankle edema.  No swelling of her knee or ankle joints  Assesment/PLAN:  Hypokalemia: She is now having a fairly good potassium level with adding Aldactone Previously requiring large doses off potassium supplements She does not have hyperaldosteronism She will continue her 100 mg of Aldactone which appears to be effective Since potassium is still 3.6 she can continue taking 20 meq potassium daily  DIABETES: Well controlled and A1c is about the same at 6.1 She is on metformin alone with adequate control She is doing very well with diet and exercise regimen and most of her blood sugars are fairly good at home although she did not bring her monitor for download today  LEG pain on the right, may be related to radiculopathy, she will discuss with PCP  Hypertension: She is on a   3 drug regimen of guanfacine, labetalol and doxazosin and blood pressure is  better controlled with this  Also taking Aldactone which probably is helping to control and she will continue the same regimen     April [name] 03/19/2015, 8:53 AM

## 2015-03-19 NOTE — Progress Notes (Signed)
Pre visit review using our clinic review tool, if applicable. No additional management support is needed unless otherwise documented below in the visit note. 

## 2015-03-23 ENCOUNTER — Other Ambulatory Visit: Payer: Self-pay | Admitting: Endocrinology

## 2015-03-23 MED ORDER — GABAPENTIN 100 MG PO CAPS: 100.0000 mg | ORAL_CAPSULE | Freq: Two times a day (BID) | ORAL | Status: DC

## 2015-03-23 MED ORDER — POTASSIUM CHLORIDE CRYS ER 20 MEQ PO TBCR: EXTENDED_RELEASE_TABLET | ORAL | Status: DC

## 2015-06-01 ENCOUNTER — Telehealth: Payer: Self-pay | Admitting: Endocrinology

## 2015-06-01 ENCOUNTER — Other Ambulatory Visit: Payer: Self-pay | Admitting: *Deleted

## 2015-06-01 NOTE — Telephone Encounter (Signed)
Please see below, I don't think it's spelled correctly, do you know what it's suppose to be?

## 2015-06-01 NOTE — Telephone Encounter (Signed)
Dr. Wynonia Lawman office calling to let us know that the pt is on torantorine not the sporanalactone

## 2015-06-01 NOTE — Telephone Encounter (Signed)
[facility] dated 05/29/15 5:05 pm Initial Comment Caller states she is Darl Pikes from Dr. Hyacinth Meeker office and needs a call back to relay information about pt.[name] CB# [phone]

## 2015-06-01 NOTE — Telephone Encounter (Signed)
Left Darl Pikes a Vm to return my call

## 2015-06-01 NOTE — Telephone Encounter (Signed)
Don't know

## 2015-06-02 ENCOUNTER — Other Ambulatory Visit: Payer: Self-pay | Admitting: *Deleted

## 2015-06-02 MED ORDER — SPIRONOLACTONE 100 MG PO TABS: ORAL_TABLET | ORAL | Status: DC

## 2015-06-02 NOTE — Telephone Encounter (Signed)
It was the triamterene-hctz, instructed patient she was just suppose to be taking Spironolactone. She verbalized understand.

## 2015-07-11 ENCOUNTER — Other Ambulatory Visit: Payer: Self-pay | Admitting: Endocrinology

## 2015-07-16 ENCOUNTER — Other Ambulatory Visit (INDEPENDENT_AMBULATORY_CARE_PROVIDER_SITE_OTHER): Payer: Medicare Other

## 2015-07-16 ENCOUNTER — Telehealth: Payer: Self-pay | Admitting: Endocrinology

## 2015-07-16 ENCOUNTER — Other Ambulatory Visit: Payer: Self-pay | Admitting: *Deleted

## 2015-07-16 DIAGNOSIS — E1142 Type 2 diabetes mellitus with diabetic polyneuropathy: Secondary | ICD-10-CM

## 2015-07-16 LAB — BASIC METABOLIC PANEL
BUN: 20 mg/dL (ref 6–23)
CO2: 25 mEq/L (ref 19–32)
Calcium: 9.9 mg/dL (ref 8.4–10.5)
Chloride: 103 mEq/L (ref 96–112)
Creatinine, Ser: 0.98 mg/dL (ref 0.40–1.20)
GFR: 71.72 mL/min (ref >60.00)
Glucose, Bld: 123 mg/dL — ABNORMAL HIGH (ref 70–99)
Potassium: 3.9 mEq/L (ref 3.5–5.1)
Sodium: 139 mEq/L (ref 135–145)

## 2015-07-16 LAB — HEMOGLOBIN A1C: Hgb A1c MFr Bld: 6.1 % (ref 4.6–6.5)

## 2015-07-16 MED ORDER — GUANFACINE HCL ER 2 MG PO TB24: ORAL_TABLET | ORAL | Status: DC

## 2015-07-16 NOTE — Telephone Encounter (Signed)
rx sent

## 2015-07-16 NOTE — Telephone Encounter (Signed)
Patient need a refill of  guanFACINE (INTUNIV) 2 MG TB24 SR tablet send to  [facility] - [place], [place] - [address] [phone] (Phone) [phone] (Fax)

## 2015-07-20 ENCOUNTER — Ambulatory Visit (INDEPENDENT_AMBULATORY_CARE_PROVIDER_SITE_OTHER): Payer: Medicare Other | Admitting: Endocrinology

## 2015-07-20 ENCOUNTER — Other Ambulatory Visit: Payer: Self-pay | Admitting: *Deleted

## 2015-07-20 ENCOUNTER — Encounter: Payer: Self-pay | Admitting: Endocrinology

## 2015-07-20 VITALS — BP 112/76 | HR 71 | Temp 97.9°F | Resp 16 | Ht 64.0 in | Wt 159.4 lb

## 2015-07-20 DIAGNOSIS — E119 Type 2 diabetes mellitus without complications: Secondary | ICD-10-CM

## 2015-07-20 DIAGNOSIS — I10 Essential (primary) hypertension: Secondary | ICD-10-CM | POA: Diagnosis not present

## 2015-07-20 DIAGNOSIS — E876 Hypokalemia: Secondary | ICD-10-CM

## 2015-07-20 MED ORDER — GUANFACINE HCL ER 2 MG PO TB24: 2.0000 mg | ORAL_TABLET | Freq: Every day | ORAL | Status: DC

## 2015-07-20 MED ORDER — HYDROCHLOROTHIAZIDE 12.5 MG PO CAPS: 12.5000 mg | ORAL_CAPSULE | Freq: Every day | ORAL | Status: DC

## 2015-07-20 NOTE — Progress Notes (Signed)
Patient ID: April [name], female   DOB: Aug 17, 1941, 74 y.o.   MRN: [id number]   Reason for Appointment: follow-up of various problems  History of Present Illness    HYPERTENSION:  diagnosed around 1979 with symptoms of headaches Previously she was taking Avapro, clonidine 0.6 at bedtime and Dyazide but blood pressure was relatively high with this regimen For evaluation of her hyperaldosteronism she was switched to labetalol 100 mg twice a day, Tenex 2 mg and doxazosin With this her blood pressure was much better She was subsequently started on Aldactone to help with her severe hypokalemia  She has checked her blood pressure at the drug store and it is usually fairly good now, usually 120-130 systolic  She is compliant with her medications and blood pressure appears to be well controlled. Not clear why she is taking HCTZ 25 mg, this was supposed to have been stopped before when she started Aldactone.  HYPOKALEMIA:  This previously had been a chronic problem and associated with hypertension  Previously had  been prescribed 6 tablets of potassium daily She was off her diuretics and Avapro when her evaluation for aldosterone was done, aldosterone level was 6.0 along with a relatively low renin level  She has a normal potassium level now with Aldactone 100 mg daily Currently  on only one potassium tablet daily  Lab Results  Component Value Date   CREATININE 0.98 07/16/2015   BUN 20 07/16/2015   NA 139 07/16/2015   K 3.9 07/16/2015   CL 103 07/16/2015   CO2 25 07/16/2015    DIABETES: This has been  well controlled with a regimen of metformin only She is generally fairly good with diet and exercise regimen Now walking regularly daily Recent minimal increase in weight A1c has been consistently upper normal Home blood sugar range = 25-127 with average 101   Wt Readings from Last 3 Encounters:  07/20/15 159 lb 6.4 oz (72.303 kg)  03/19/15 161 lb 8 oz (73.256 kg)  12/18/14  158 lb (71.668 kg)      Lab Results  Component Value Date   HGBA1C 6.1 07/16/2015   HGBA1C 6.1 03/17/2015   HGBA1C 6.3 12/15/2014   Lab Results  Component Value Date   MICROALBUR 2.3* 03/17/2015   LDLCALC 93 03/17/2015   CREATININE 0.98 07/16/2015     LABS:  Appointment on 07/16/2015  Component Date Value Ref Range Status  . Sodium 07/16/2015 139  135 - 145 mEq/L Final  . Potassium 07/16/2015 3.9  3.5 - 5.1 mEq/L Final  . Chloride 07/16/2015 103  96 - 112 mEq/L Final  . CO2 07/16/2015 25  19 - 32 mEq/L Final  . Glucose, Bld 07/16/2015 123* 70 - 99 mg/dL Final  . BUN 92/42/6834 20  6 - 23 mg/dL Final  . Creatinine, Ser 07/16/2015 0.98  0.40 - 1.20 mg/dL Final  . Calcium 19/62/2297 9.9  8.4 - 10.5 mg/dL Final  . GFR 98/92/1194 71.72  >60.00 mL/min Final  . Hgb A1c MFr Bld 07/16/2015 6.1  4.6 - 6.5 % Final   Glycemic Control Guidelines for People with Diabetes:Non Diabetic:  <6%Goal of Therapy: <7%Additional Action Suggested:  >8%       Medication List       This list is accurate as of: 07/20/15  4:48 PM.  Always use your most recent med list.               ACCU-CHEK COMPACT TEST DRUM test strip  Generic drug:  glucose blood     ACCU-CHEK SOFTCLIX LANCETS lancets     doxazosin 2 MG tablet  Commonly known as:  CARDURA  Take 1 tablet by mouth at  bedtime     ezetimibe 10 MG tablet  Commonly known as:  ZETIA  Take 10 mg by mouth daily.     fexofenadine 180 MG tablet  Commonly known as:  ALLEGRA  Take 180 mg by mouth as needed.     fluticasone 50 MCG/ACT nasal spray  Commonly known as:  FLONASE  2 sprays by Nasal route as needed.     folic acid 0.5 MG tablet  Commonly known as:  FOLVITE  Take 1 mg by mouth daily.     gabapentin 100 MG capsule  Commonly known as:  NEURONTIN  Take 1 capsule (100 mg total) by mouth 2 (two) times daily.     guanFACINE 2 MG Tb24 SR tablet  Commonly known as:  INTUNIV  Take 1 tablet (2 mg total) by mouth daily.      hydrochlorothiazide 12.5 MG capsule  Commonly known as:  MICROZIDE  Take 1 capsule (12.5 mg total) by mouth daily.     labetalol 100 MG tablet  Commonly known as:  NORMODYNE  TAKE 1 TABLET BY MOUTH TWICE A DAY     metFORMIN 1000 MG tablet  Commonly known as:  GLUCOPHAGE  Take 1 tablet (1,000 mg total) by mouth 2 (two) times daily.     methotrexate 2.5 MG tablet  Commonly known as:  RHEUMATREX  Take 6 mg by mouth once a week.     NUCYNTA 50 MG Tabs tablet  Generic drug:  tapentadol  Take 50 mg by mouth as needed.     potassium chloride SA 20 MEQ tablet  Commonly known as:  KLOR-CON M20  Take 1 tablet by mouth once a day     pravastatin 80 MG tablet  Commonly known as:  PRAVACHOL  Take 1 tablet by mouth  daily     spironolactone 100 MG tablet  Commonly known as:  ALDACTONE  Take 1 tablet by mouth  daily        Allergies:  Allergies  Allergen Reactions  . Acetaminophen     REACTION: unspecified  . Actos [Pioglitazone Hydrochloride]     edema  . Aspirin     REACTION: unspecified  . Atorvastatin     REACTION: unspecified  . Morphine Sulfate     REACTION: unspecified  . Naproxen Sodium     REACTION: unspecified  . Sitagliptin Phosphate     REACTION: nausea    Past Medical History  Diagnosis Date  . Hypertension   . Diabetes mellitus     TYPE 2  . Hyperkalemia   . RA (rheumatoid arthritis)   . Insomnia   . Dyslipidemia   . Allergic rhinitis     Past Surgical History  Procedure Laterality Date  . Hand tendon surgery      Right    Family History  Problem Relation Age of Onset  . Diabetes Mother   . Diabetes Father   . Diabetes Sister     Social History:  reports that she has never smoked. She does not have any smokeless tobacco history on file. She reports that she does not drink alcohol or use illicit drugs.  Review of Systems    History of rheumatoid arthritis on treatment, not on steroids currently  Anemia: mild; is on iron, probably  related to a combination  of rheumatoid arthritis and iron deficiency as well as chronic disease  Vitamin D deficiency: Currently taking vitamin D 3, 1000 U daily    Examination:   BP 112/76 mmHg  Pulse 71  Temp(Src) 97.9 F (36.6 C)  Resp 16  Ht 5\' 4"  (1.626 m)  Wt 159 lb 6.4 oz (72.303 kg)  BMI 27.35 kg/m2  SpO2 98%  Body mass index is 27.35 kg/(m^2).    No ankle edema.   Assesment/PLAN:  Hypokalemia: She is now having a fairly stable potassium level with adding Aldactone and using only 1 tablet of potassium with this She does not have hyperaldosteronism She will continue her 100 mg of Aldactone for benefit of blood pressure also Since potassium is still 3.9 she can continue taking 20 meq potassium daily  DIABETES: Well controlled and A1c is  the same at 6.1 She is on metformin alone with adequate control She is doing very well with diet and usually is compliant with exercise regimen  Blood sugars are excellent at home  Hypertension: She is on a 4 drug regimen of guanfacine, Aldactone, labetalol and doxazosin and blood pressure is controlled with this  Aldactone has improved her control as well as avoiding hypokalemia: Not clear why she is taking HCTZ and will have her reduce this to 12.5 She will check on her coverage for extended release guanfacine at the local drugstore as it is more expensive by mail-order May consider retrying Catapres patches also although she thinks she had difficulty with the patches sticking    April [name] 07/20/2015, 4:48 PM

## 2015-07-20 NOTE — Patient Instructions (Signed)
Reduce HCTZ to 12.5mg 

## 2015-08-04 ENCOUNTER — Ambulatory Visit: Payer: Medicare Other | Admitting: Cardiovascular Disease

## 2015-08-05 ENCOUNTER — Encounter: Payer: Self-pay | Admitting: Cardiovascular Disease

## 2015-08-11 ENCOUNTER — Emergency Department (HOSPITAL_COMMUNITY)
Admission: EM | Admit: 2015-08-11 | Discharge: 2015-08-11 | Disposition: A | Discharge status: Home / Self Care | Payer: No Typology Code available for payment source | Attending: Emergency Medicine | Admitting: Emergency Medicine

## 2015-08-11 ENCOUNTER — Encounter (HOSPITAL_COMMUNITY): Payer: Self-pay | Admitting: Emergency Medicine

## 2015-08-11 ENCOUNTER — Emergency Department (HOSPITAL_COMMUNITY): Payer: No Typology Code available for payment source

## 2015-08-11 DIAGNOSIS — Z79899 Other long term (current) drug therapy: Secondary | ICD-10-CM | POA: Insufficient documentation

## 2015-08-11 DIAGNOSIS — E119 Type 2 diabetes mellitus without complications: Secondary | ICD-10-CM | POA: Diagnosis not present

## 2015-08-11 DIAGNOSIS — S6392XA Sprain of unspecified part of left wrist and hand, initial encounter: Secondary | ICD-10-CM | POA: Insufficient documentation

## 2015-08-11 DIAGNOSIS — Z8709 Personal history of other diseases of the respiratory system: Secondary | ICD-10-CM | POA: Insufficient documentation

## 2015-08-11 DIAGNOSIS — Y9389 Activity, other specified: Secondary | ICD-10-CM | POA: Insufficient documentation

## 2015-08-11 DIAGNOSIS — I10 Essential (primary) hypertension: Secondary | ICD-10-CM | POA: Diagnosis not present

## 2015-08-11 DIAGNOSIS — Y92410 Unspecified street and highway as the place of occurrence of the external cause: Secondary | ICD-10-CM | POA: Insufficient documentation

## 2015-08-11 DIAGNOSIS — G4700 Insomnia, unspecified: Secondary | ICD-10-CM | POA: Diagnosis not present

## 2015-08-11 DIAGNOSIS — Y998 Other external cause status: Secondary | ICD-10-CM | POA: Insufficient documentation

## 2015-08-11 DIAGNOSIS — E785 Hyperlipidemia, unspecified: Secondary | ICD-10-CM | POA: Diagnosis not present

## 2015-08-11 DIAGNOSIS — S66912A Strain of unspecified muscle, fascia and tendon at wrist and hand level, left hand, initial encounter: Secondary | ICD-10-CM | POA: Diagnosis not present

## 2015-08-11 DIAGNOSIS — Z8739 Personal history of other diseases of the musculoskeletal system and connective tissue: Secondary | ICD-10-CM | POA: Insufficient documentation

## 2015-08-11 DIAGNOSIS — S6992XA Unspecified injury of left wrist, hand and finger(s), initial encounter: Secondary | ICD-10-CM | POA: Diagnosis present

## 2015-08-11 NOTE — ED Notes (Addendum)
Pt prefers to sit in chair

## 2015-08-11 NOTE — ED Notes (Signed)
Pt is a&ox4 and ambulatory, ace wrap applied. Questions r/t dc were denied

## 2015-08-11 NOTE — Discharge Instructions (Signed)
Joint Sprain °A sprain is a tear or stretch in the ligaments that hold a joint together. Severe sprains may need as long as 3-6 weeks of immobilization and/or exercises to heal completely. Sprained joints should be rested and protected. If not, they can become unstable and prone to re-injury. Proper treatment can reduce your pain, shorten the period of disability, and reduce the risk of repeated injuries. °TREATMENT  °· Rest and elevate the injured joint to reduce pain and swelling. °· Apply ice packs to the injury for 20-30 minutes every 2-3 hours for the next 2-3 days. °· Keep the injury wrapped in a compression bandage or splint as long as the joint is painful or as instructed by your caregiver. °· Do not use the injured joint until it is completely healed to prevent re-injury and chronic instability. Follow the instructions of your caregiver. °· Long-term sprain management may require exercises and/or treatment by a physical therapist. Taping or special braces may help stabilize the joint until it is completely better. °SEEK MEDICAL CARE IF:  °· You develop increased pain or swelling of the joint. °· You develop increasing redness and warmth of the joint. °· You develop a fever. °· It becomes stiff. °· Your hand or foot gets cold or numb. °Document Released: 12/29/2004 Document Revised: 02/13/2012 Document Reviewed: 12/08/2008 °[employer]® Patient Information ©2015 [employer]. This information is not intended to replace advice given to you by your health care provider. Make sure you discuss any questions you have with your health care provider. ° °

## 2015-08-11 NOTE — ED Notes (Signed)
lue is immobilized
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
## 2015-09-02 ENCOUNTER — Other Ambulatory Visit: Payer: Self-pay | Admitting: Endocrinology

## 2015-09-15 ENCOUNTER — Ambulatory Visit (INDEPENDENT_AMBULATORY_CARE_PROVIDER_SITE_OTHER): Payer: Medicare Other | Admitting: Cardiovascular Disease

## 2015-09-15 ENCOUNTER — Encounter: Payer: Self-pay | Admitting: Cardiovascular Disease

## 2015-09-15 VITALS — BP 116/78 | HR 58 | Ht 64.0 in | Wt 162.8 lb

## 2015-09-15 DIAGNOSIS — E785 Hyperlipidemia, unspecified: Secondary | ICD-10-CM

## 2015-09-15 DIAGNOSIS — I10 Essential (primary) hypertension: Secondary | ICD-10-CM

## 2015-09-15 LAB — HEPATIC FUNCTION PANEL
ALT: 16 U/L (ref 6–29)
AST: 24 U/L (ref 10–35)
Albumin: 4.5 g/dL (ref 3.6–5.1)
Alkaline Phosphatase: 75 U/L (ref 33–130)
Bilirubin, Direct: 0.2 mg/dL (ref <=0.2)
Indirect Bilirubin: 0.7 mg/dL (ref 0.2–1.2)
Total Bilirubin: 0.9 mg/dL (ref 0.2–1.2)
Total Protein: 7.2 g/dL (ref 6.1–8.1)

## 2015-09-15 LAB — BASIC METABOLIC PANEL
BUN: 18 mg/dL (ref 7–25)
CO2: 26 mmol/L (ref 20–31)
Calcium: 10 mg/dL (ref 8.6–10.4)
Chloride: 100 mmol/L (ref 98–110)
Creat: 0.97 mg/dL — ABNORMAL HIGH (ref 0.60–0.93)
Glucose, Bld: 70 mg/dL (ref 65–99)
Potassium: 4.1 mmol/L (ref 3.5–5.3)
Sodium: 140 mmol/L (ref 135–146)

## 2015-09-15 LAB — LIPID PANEL
Cholesterol: 149 mg/dL (ref 125–200)
HDL: 77 mg/dL (ref >=46)
LDL Cholesterol: 63 mg/dL (ref <130)
Total CHOL/HDL Ratio: 1.9 Ratio (ref <=5.0)
Triglycerides: 47 mg/dL (ref <150)
VLDL: 9 mg/dL (ref <30)

## 2015-09-15 NOTE — Patient Instructions (Signed)
Medication Instructions:  Your physician recommends that you continue on your current medications as directed. Please refer to the Current Medication list given to you today.   Labwork: TODAY - cholesterol, liver, basic metabolic panel   Testing/Procedures: None Ordered   Follow-Up: Your physician wants you to follow-up in: 1 year with Dr. Nahser.  You will receive a reminder letter in the mail two months in advance. If you don't receive a letter, please call our office to schedule the follow-up appointment.      

## 2015-09-15 NOTE — Addendum Note (Signed)
Addended by: Sydnee Cabal R on: 09/15/2015 10:46 AM   Modules accepted: Orders
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
## 2015-10-14 ENCOUNTER — Other Ambulatory Visit (INDEPENDENT_AMBULATORY_CARE_PROVIDER_SITE_OTHER): Payer: Medicare Other

## 2015-10-14 DIAGNOSIS — E119 Type 2 diabetes mellitus without complications: Secondary | ICD-10-CM

## 2015-10-14 LAB — COMPREHENSIVE METABOLIC PANEL
ALT: 16 U/L (ref 0–35)
AST: 21 U/L (ref 0–37)
Albumin: 4.5 g/dL (ref 3.5–5.2)
Alkaline Phosphatase: 81 U/L (ref 39–117)
BUN: 14 mg/dL (ref 6–23)
CO2: 27 mEq/L (ref 19–32)
Calcium: 10 mg/dL (ref 8.4–10.5)
Chloride: 102 mEq/L (ref 96–112)
Creatinine, Ser: 0.89 mg/dL (ref 0.40–1.20)
GFR: 80.1 mL/min (ref >60.00)
Glucose, Bld: 97 mg/dL (ref 70–99)
Potassium: 3.9 mEq/L (ref 3.5–5.1)
Sodium: 138 mEq/L (ref 135–145)
Total Bilirubin: 0.7 mg/dL (ref 0.2–1.2)
Total Protein: 7.4 g/dL (ref 6.0–8.3)

## 2015-10-14 LAB — HEMOGLOBIN A1C: Hgb A1c MFr Bld: 5.9 % (ref 4.6–6.5)

## 2015-10-15 ENCOUNTER — Other Ambulatory Visit: Payer: Self-pay | Admitting: Endocrinology

## 2015-10-20 ENCOUNTER — Encounter: Payer: Self-pay | Admitting: Endocrinology

## 2015-10-20 ENCOUNTER — Ambulatory Visit (INDEPENDENT_AMBULATORY_CARE_PROVIDER_SITE_OTHER): Payer: Medicare Other | Admitting: Endocrinology

## 2015-10-20 VITALS — BP 118/72 | HR 70 | Temp 98.2°F | Resp 14 | Ht 64.0 in | Wt 165.6 lb

## 2015-10-20 DIAGNOSIS — E119 Type 2 diabetes mellitus without complications: Secondary | ICD-10-CM | POA: Diagnosis not present

## 2015-10-20 DIAGNOSIS — I10 Essential (primary) hypertension: Secondary | ICD-10-CM

## 2015-10-20 NOTE — Patient Instructions (Signed)
Check blood sugars on waking up 2-3 times a week Also check blood sugars about 2 hours after a meal and do this after different meals by rotation  Recommended blood sugar levels on waking up is 90-130 and about 2 hours after meal is 130-160  Please bring your blood sugar monitor to each visit, thank you  

## 2015-10-20 NOTE — Progress Notes (Signed)
Patient ID: April [name], female   DOB: [birth year], 74 y.o.   MRN: [id number]   Reason for Appointment: follow-up of various problems  History of Present Illness    HYPERTENSION:  diagnosed around 1979 with symptoms of headaches Previously she was taking Avapro, clonidine 0.6 at bedtime and Dyazide but blood pressure was relatively high with this regimen For evaluation of her hyperaldosteronism she was switched to labetalol 100 mg twice a day, Tenex 2 mg and doxazosin With this regimen her blood pressure has been much better She was subsequently started on Aldactone to help with her severe hypokalemia    Because of the expense of the guanfacine she stopped taking this without informing us on the last visit   Also on her last visit she was told to reduce her HCTZ to half tablet  She has checked her blood pressure at the drug store and it is usually fairly good now, usually 116/70s      She does not complain of lightheadedness on standing up She is compliant with her medications and blood pressure appears to be well controlled.  HYPOKALEMIA:  This previously had been a chronic problem and associated with hypertension  Previously had  been prescribed 6 tablets of potassium daily She was off her diuretics and Avapro when her evaluation for aldosterone was done, aldosterone level was 6.0 along with a relatively low renin level  She has a normal potassium level now with Aldactone 100 mg daily Currently  on only one potassium tablet daily  Lab Results  Component Value Date   CREATININE 0.89 10/14/2015   BUN 14 10/14/2015   NA 138 10/14/2015   K 3.9 10/14/2015   CL 102 10/14/2015   CO2 27 10/14/2015    DIABETES: This has been  well controlled with a regimen of metformin 1 g twice a day only She is generally fairly good with diet and exercise regimen  Now walking regularly daily up to 1 hour A1c has been consistently upper normal Home blood sugar range = 75-118 with average  102 , checking only in the morning   Not clear why her weight is tending to go up   Wt Readings from Last 3 Encounters:  10/20/15 165 lb 9.6 oz (75.116 kg)  09/15/15 162 lb 12.8 oz (73.846 kg)  07/20/15 159 lb 6.4 oz (72.303 kg)      Lab Results  Component Value Date   HGBA1C 5.9 10/14/2015   HGBA1C 6.1 07/16/2015   HGBA1C 6.1 03/17/2015   Lab Results  Component Value Date   MICROALBUR 2.3* 03/17/2015   LDLCALC 63 09/15/2015   CREATININE 0.89 10/14/2015     LABS:  Appointment on 10/14/2015  Component Date Value Ref Range Status  . Hgb A1c MFr Bld 10/14/2015 5.9  4.6 - 6.5 % Final   Glycemic Control Guidelines for People with Diabetes:Non Diabetic:  <6%Goal of Therapy: <7%Additional Action Suggested:  >8%   . Sodium 10/14/2015 138  135 - 145 mEq/L Final  . Potassium 10/14/2015 3.9  3.5 - 5.1 mEq/L Final  . Chloride 10/14/2015 102  96 - 112 mEq/L Final  . CO2 10/14/2015 27  19 - 32 mEq/L Final  . Glucose, Bld 10/14/2015 97  70 - 99 mg/dL Final  . BUN 29/52/8413 14  6 - 23 mg/dL Final  . Creatinine, Ser 10/14/2015 0.89  0.40 - 1.20 mg/dL Final  . Total Bilirubin 10/14/2015 0.7  0.2 - 1.2 mg/dL Final  . Alkaline Phosphatase 10/14/2015 81  39 - 117 U/L Final  . AST 10/14/2015 21  0 - 37 U/L Final  . ALT 10/14/2015 16  0 - 35 U/L Final  . Total Protein 10/14/2015 7.4  6.0 - 8.3 g/dL Final  . Albumin 20/94/7096 4.5  3.5 - 5.2 g/dL Final  . Calcium 28/36/6294 10.0  8.4 - 10.5 mg/dL Final  . GFR 76/54/6503 80.10  >60.00 mL/min Final      Medication List       This list is accurate as of: 10/20/15 10:31 AM.  Always use your most recent med list.               ACCU-CHEK COMPACT PLUS test strip  Generic drug:  glucose blood     ACCU-CHEK SOFTCLIX LANCETS lancets     doxazosin 2 MG tablet  Commonly known as:  CARDURA  Take 1 tablet by mouth at  bedtime     ezetimibe 10 MG tablet  Commonly known as:  ZETIA  Take 10 mg by mouth daily.     folic acid 0.5 MG  tablet  Commonly known as:  FOLVITE  Take 0.5 mg by mouth daily.     gabapentin 100 MG capsule  Commonly known as:  NEURONTIN  Take 1 capsule by mouth two times daily     hydrochlorothiazide 25 MG tablet  Commonly known as:  HYDRODIURIL  Take 12.5 mg by mouth daily.     hydrochlorothiazide 12.5 MG capsule  Commonly known as:  MICROZIDE     IRON PO  Take 1 tablet by mouth daily.     labetalol 100 MG tablet  Commonly known as:  NORMODYNE  Take 1 tablet by mouth  twice a day     metFORMIN 1000 MG tablet  Commonly known as:  GLUCOPHAGE  Take 1 tablet by mouth  twice a day     methotrexate 2.5 MG tablet  Commonly known as:  RHEUMATREX  Take 15 mg by mouth once a week. Patient takes 3 tablets twice once a week to add up to be 6 tablets once weekly, starts with 3 tablets in the evening and takes 3 more tablets in the morning     NUCYNTA 50 MG Tabs tablet  Generic drug:  tapentadol  Take 50 mg by mouth as needed for severe pain (spasms).     potassium chloride SA 20 MEQ tablet  Commonly known as:  KLOR-CON M20  Take 1 tablet by mouth once a day     pravastatin 80 MG tablet  Commonly known as:  PRAVACHOL  Take 1 tablet by mouth  daily     spironolactone 100 MG tablet  Commonly known as:  ALDACTONE  Take 1 tablet by mouth  daily     vitamin B-12 1000 MCG tablet  Commonly known as:  CYANOCOBALAMIN  Take 1,000 mcg by mouth daily.        Allergies:  Allergies  Allergen Reactions  . Acetaminophen Nausea Only  . Actos [Pioglitazone Hydrochloride]     edema  . Aspirin Nausea Only  . Atorvastatin Other (See Comments)    Felt drunk/high floating  . Morphine Sulfate Other (See Comments)    Burns injecting "arm is on fire"  . Naproxen Sodium Nausea Only  . Sitagliptin Phosphate Nausea And Vomiting    Past Medical History  Diagnosis Date  . Hypertension   . Diabetes mellitus     TYPE 2  . Hyperkalemia   . RA (rheumatoid arthritis) (HCC)   .  Insomnia   .  Dyslipidemia   . Allergic rhinitis     Past Surgical History  Procedure Laterality Date  . Hand tendon surgery      Right    Family History  Problem Relation Age of Onset  . Diabetes Mother   . Diabetes Father   . Diabetes Sister     Social History:  reports that she has never smoked. She does not have any smokeless tobacco history on file. She reports that she does not drink alcohol or use illicit drugs.  Review of Systems     This is a copy of previous information:  History of rheumatoid arthritis on treatment, not on steroids currently  Anemia: mild; is on iron, probably related to a combination of rheumatoid arthritis and iron deficiency as well as chronic disease  Vitamin D deficiency: Currently taking vitamin D 3, 1000 U daily    Examination:   BP 118/72 mmHg  Pulse 70  Temp(Src) 98.2 F (36.8 C)  Resp 14  Ht 5\' 4"  (1.626 m)  Wt 165 lb 9.6 oz (75.116 kg)  BMI 28.41 kg/m2  SpO2 98%  Body mass index is 28.41 kg/(m^2).    No ankle edema.   Assesment/PLAN:  Hypokalemia: She has a fairly stable potassium level with adding Aldactone and using only 1 tablet of potassium with this She does not have hyperaldosteronism She will continue her 100 mg of Aldactone for benefit of blood pressure also Since potassium is still 3.9 she can continue taking 20 meq potassium daily  DIABETES: Well controlled and A1c is excellent at 5.9 She is on metformin alone with adequate control She is doing very well with diet and usually is compliant with exercise regimen  Blood sugars are excellent at home but is not monitoring after meals and this was discussed  She will need to watch her weight as this appears to be going up  Hypertension: She is on a 3 drug regimen of  Aldactone, labetalol and doxazosin and blood pressure is controlled with this   Aldactone has improved her control as well as controlling the significant hypokalemia she had previously and will continue 100 mg      follow-up in 4 months   She refuses influenza vaccine   [facility] 10/20/2015, 10:31 AM

## 2015-10-31 ENCOUNTER — Other Ambulatory Visit: Payer: Self-pay | Admitting: Endocrinology

## 2015-11-02 ENCOUNTER — Other Ambulatory Visit: Payer: Self-pay | Admitting: Endocrinology

## 2015-12-08 ENCOUNTER — Telehealth: Payer: Self-pay | Admitting: Endocrinology

## 2015-12-08 ENCOUNTER — Other Ambulatory Visit: Payer: Self-pay | Admitting: Endocrinology

## 2015-12-08 ENCOUNTER — Other Ambulatory Visit: Payer: Self-pay | Admitting: *Deleted

## 2015-12-08 MED ORDER — POTASSIUM CHLORIDE CRYS ER 20 MEQ PO TBCR: 20.0000 meq | EXTENDED_RELEASE_TABLET | Freq: Every day | ORAL | Status: DC

## 2015-12-08 MED ORDER — LABETALOL HCL 100 MG PO TABS: ORAL_TABLET | ORAL | Status: DC

## 2015-12-08 MED ORDER — GABAPENTIN 100 MG PO CAPS: ORAL_CAPSULE | ORAL | Status: DC

## 2015-12-08 MED ORDER — ACCU-CHEK COMPACT PLUS VI STRP: ORAL_STRIP | Status: DC

## 2015-12-08 MED ORDER — DOXAZOSIN MESYLATE 2 MG PO TABS: ORAL_TABLET | ORAL | Status: DC

## 2015-12-08 MED ORDER — ACCU-CHEK SOFTCLIX LANCETS MISC: Status: DC

## 2015-12-08 MED ORDER — METFORMIN HCL 1000 MG PO TABS: ORAL_TABLET | ORAL | Status: DC

## 2015-12-08 MED ORDER — SPIRONOLACTONE 100 MG PO TABS: ORAL_TABLET | ORAL | Status: DC

## 2015-12-08 MED ORDER — HYDROCHLOROTHIAZIDE 12.5 MG PO CAPS: 12.5000 mg | ORAL_CAPSULE | Freq: Every day | ORAL | Status: DC

## 2015-12-08 MED ORDER — PRAVASTATIN SODIUM 80 MG PO TABS: ORAL_TABLET | ORAL | Status: DC

## 2015-12-08 NOTE — Telephone Encounter (Signed)
rxs have been sent.  

## 2015-12-08 NOTE — Telephone Encounter (Signed)
Patient would like a refill of all her medications send to   [facility] - [place], [place] - [address] [phone] (Phone) [phone] (Fax)

## 2015-12-11 ENCOUNTER — Encounter: Payer: Self-pay | Admitting: Endocrinology

## 2015-12-11 DIAGNOSIS — E119 Type 2 diabetes mellitus without complications: Secondary | ICD-10-CM | POA: Diagnosis not present

## 2015-12-11 LAB — HM DIABETES EYE EXAM

## 2015-12-14 ENCOUNTER — Other Ambulatory Visit: Payer: Self-pay | Admitting: Endocrinology

## 2015-12-15 ENCOUNTER — Telehealth: Payer: Self-pay | Admitting: Endocrinology

## 2015-12-15 ENCOUNTER — Other Ambulatory Visit: Payer: Self-pay | Admitting: *Deleted

## 2015-12-15 MED ORDER — PRAVASTATIN SODIUM 80 MG PO TABS: ORAL_TABLET | ORAL | Status: DC

## 2015-12-15 NOTE — Telephone Encounter (Signed)
rx sent

## 2015-12-15 NOTE — Telephone Encounter (Signed)
Patient called stating that she would like a refill on her Rx   Rx: Pravastatin    Pharmacy: [facility]      Thank You

## 2015-12-16 ENCOUNTER — Other Ambulatory Visit: Payer: Self-pay | Admitting: *Deleted

## 2015-12-16 ENCOUNTER — Telehealth: Payer: Self-pay | Admitting: Endocrinology

## 2015-12-16 MED ORDER — POTASSIUM CHLORIDE CRYS ER 20 MEQ PO TBCR: 20.0000 meq | EXTENDED_RELEASE_TABLET | Freq: Every day | ORAL | Status: DC

## 2015-12-16 MED ORDER — GABAPENTIN 100 MG PO CAPS: ORAL_CAPSULE | ORAL | Status: DC

## 2015-12-16 MED ORDER — DOXAZOSIN MESYLATE 2 MG PO TABS: ORAL_TABLET | ORAL | Status: DC

## 2015-12-16 NOTE — Telephone Encounter (Signed)
Patient came into the office and would like Rhonda to please the following Rx to her pharmacy  Rx:  Gabapentin Methotrexate Doxazosin  Potassium   [facility] [address]    Thank you

## 2015-12-16 NOTE — Telephone Encounter (Signed)
[facility], methotrexate is not a prescription Dr. Lucianne Muss fills for her.

## 2016-01-11 ENCOUNTER — Other Ambulatory Visit: Payer: Self-pay | Admitting: *Deleted

## 2016-01-11 MED ORDER — SPIRONOLACTONE 100 MG PO TABS: ORAL_TABLET | ORAL | Status: DC

## 2016-01-11 MED ORDER — LABETALOL HCL 100 MG PO TABS: 100.0000 mg | ORAL_TABLET | Freq: Two times a day (BID) | ORAL | Status: DC

## 2016-01-20 ENCOUNTER — Ambulatory Visit: Payer: Medicare Other | Admitting: Endocrinology

## 2016-01-21 ENCOUNTER — Other Ambulatory Visit (INDEPENDENT_AMBULATORY_CARE_PROVIDER_SITE_OTHER): Payer: PPO

## 2016-01-21 DIAGNOSIS — E119 Type 2 diabetes mellitus without complications: Secondary | ICD-10-CM

## 2016-01-21 LAB — COMPREHENSIVE METABOLIC PANEL
ALT: 16 U/L (ref 0–35)
AST: 19 U/L (ref 0–37)
Albumin: 4.5 g/dL (ref 3.5–5.2)
Alkaline Phosphatase: 72 U/L (ref 39–117)
BUN: 19 mg/dL (ref 6–23)
CO2: 28 mEq/L (ref 19–32)
Calcium: 9.8 mg/dL (ref 8.4–10.5)
Chloride: 102 mEq/L (ref 96–112)
Creatinine, Ser: 1.02 mg/dL (ref 0.40–1.20)
GFR: 68.39 mL/min (ref >60.00)
Glucose, Bld: 92 mg/dL (ref 70–99)
Potassium: 3.9 mEq/L (ref 3.5–5.1)
Sodium: 138 mEq/L (ref 135–145)
Total Bilirubin: 0.5 mg/dL (ref 0.2–1.2)
Total Protein: 7.4 g/dL (ref 6.0–8.3)

## 2016-01-21 LAB — MICROALBUMIN / CREATININE URINE RATIO
Creatinine,U: 296.1 mg/dL
Microalb Creat Ratio: 0.9 mg/g (ref 0.0–30.0)
Microalb, Ur: 2.7 mg/dL — ABNORMAL HIGH (ref 0.0–1.9)

## 2016-01-21 LAB — HEMOGLOBIN A1C: Hgb A1c MFr Bld: 6 % (ref 4.6–6.5)

## 2016-01-26 ENCOUNTER — Ambulatory Visit (INDEPENDENT_AMBULATORY_CARE_PROVIDER_SITE_OTHER): Payer: PPO | Admitting: Endocrinology

## 2016-01-26 ENCOUNTER — Encounter: Payer: Self-pay | Admitting: Endocrinology

## 2016-01-26 VITALS — BP 128/74 | HR 65 | Temp 97.7°F | Resp 14 | Ht 64.0 in | Wt 157.0 lb

## 2016-01-26 DIAGNOSIS — L600 Ingrowing nail: Secondary | ICD-10-CM

## 2016-01-26 DIAGNOSIS — E119 Type 2 diabetes mellitus without complications: Secondary | ICD-10-CM | POA: Diagnosis not present

## 2016-01-26 DIAGNOSIS — E876 Hypokalemia: Secondary | ICD-10-CM

## 2016-01-26 DIAGNOSIS — I10 Essential (primary) hypertension: Secondary | ICD-10-CM

## 2016-01-26 DIAGNOSIS — M792 Neuralgia and neuritis, unspecified: Secondary | ICD-10-CM

## 2016-01-26 NOTE — Progress Notes (Signed)
Patient ID: April [name], female   DOB: Dec 07, 1940, 75 y.o.   MRN: [id number]   Reason for Appointment: follow-up of various problems  History of Present Illness    HYPERTENSION:  diagnosed around 1979 with symptoms of headaches Previously she was taking Avapro, clonidine 0.6 at bedtime and Dyazide but blood pressure was relatively high with this regimen For evaluation of her hyperaldosteronism she was switched to labetalol 100 mg twice a day, Tenex 2 mg and doxazosin With this regimen her blood pressure has been much better She was subsequently started on Aldactone to help with her severe hypokalemia  CURRENTLY she is taking 100 mg Aldactone, has 0.5 mg hydrochlorothiazide, doxazosin 2 mg and labetalol 100 mg twice a day  She has checked her blood pressure at the drug store and it is mostly normal and occasionally slightly low   She does not complain of lightheadedness on standing up She is compliant with her medications and blood pressure has been very well controlled  HYPOKALEMIA:  This previously had been a chronic problem and associated with hypertension  Previously had  been prescribed 6 tablets of potassium daily She was off her diuretics and Avapro when her evaluation for hyperaldosteronism was done, aldosterone level was 6.0 along with a relatively low renin level  She has a normal potassium level now with Aldactone 100 mg daily Currently  on only one potassium tablet dailywith adequate control  Lab Results  Component Value Date   CREATININE 1.02 01/21/2016   BUN 19 01/21/2016   NA 138 01/21/2016   K 3.9 01/21/2016   CL 102 01/21/2016   CO2 28 01/21/2016    DIABETES type II: This has been  well controlled with a regimen of metformin 1 g twice a day only No side effects with this She is generally fairly good with diet and her weight has improved with that her portion control She has usually a good  exercise regimen  Now walking up to 45  min  A1c has been consistently upper normal Home blood sugar range =67-163 with AVERAGE 99 She says she is checking blood sugars around 9 PM but her date and time withdrawal on the monitor  Wt Readings from Last 3 Encounters:  01/26/16 157 lb (71.215 kg)  10/20/15 165 lb 9.6 oz (75.116 kg)  09/15/15 162 lb 12.8 oz (73.846 kg)      Lab Results  Component Value Date   HGBA1C 6.0 01/21/2016   HGBA1C 5.9 10/14/2015   HGBA1C 6.1 07/16/2015   Lab Results  Component Value Date   MICROALBUR 2.7* 01/21/2016   LDLCALC 63 09/15/2015   CREATININE 1.02 01/21/2016    OTHER active problems discussed today are in review of systems  LABS:  Lab on 01/21/2016  Component Date Value Ref Range Status  . Hgb A1c MFr Bld 01/21/2016 6.0  4.6 - 6.5 % Final   Glycemic Control Guidelines for People with Diabetes:Non Diabetic:  <6%Goal of Therapy: <7%Additional Action Suggested:  >8%   . Sodium 01/21/2016 138  135 - 145 mEq/L Final  . Potassium 01/21/2016 3.9  3.5 - 5.1 mEq/L Final  . Chloride 01/21/2016 102  96 - 112 mEq/L Final  . CO2 01/21/2016 28  19 - 32 mEq/L Final  . Glucose, Bld 01/21/2016 92  70 - 99 mg/dL Final  . BUN 71/05/2693 19  6 - 23 mg/dL Final  . Creatinine, Ser 01/21/2016 1.02  0.40 - 1.20 mg/dL Final  . Total Bilirubin  01/21/2016 0.5  0.2 - 1.2 mg/dL Final  . Alkaline Phosphatase 01/21/2016 72  39 - 117 U/L Final  . AST 01/21/2016 19  0 - 37 U/L Final  . ALT 01/21/2016 16  0 - 35 U/L Final  . Total Protein 01/21/2016 7.4  6.0 - 8.3 g/dL Final  . Albumin 16/09/9603 4.5  3.5 - 5.2 g/dL Final  . Calcium 54/08/8118 9.8  8.4 - 10.5 mg/dL Final  . GFR 14/78/2956 68.39  >60.00 mL/min Final  . Microalb, Ur 01/21/2016 2.7* 0.0 - 1.9 mg/dL Final  . Creatinine,U 21/30/8657 296.1   Final  . Microalb Creat Ratio 01/21/2016 0.9  0.0 - 30.0 mg/g Final      Medication List       This list is accurate as of: 01/26/16  9:28 PM.  Always use your most recent med list.                ACCU-CHEK COMPACT PLUS test strip  Generic drug:  glucose blood  Use to check blood sugar once a day dx code E11.9     ACCU-CHEK SOFTCLIX LANCETS lancets  Use to check blood sugar once a day dx code E11.9     doxazosin 2 MG tablet  Commonly known as:  CARDURA  Take 1 tablet by mouth at  bedtime     ezetimibe 10 MG tablet  Commonly known as:  ZETIA  Take 10 mg by mouth daily.     folic acid 0.5 MG tablet  Commonly known as:  FOLVITE  Take 0.5 mg by mouth daily.     gabapentin 100 MG capsule  Commonly known as:  NEURONTIN  Take 1 capsule by mouth two times daily     hydrochlorothiazide 25 MG tablet  Commonly known as:  HYDRODIURIL  Take 12.5 mg by mouth daily.     hydrochlorothiazide 12.5 MG capsule  Commonly known as:  MICROZIDE     hydrochlorothiazide 12.5 MG capsule  Commonly known as:  MICROZIDE  Take 1 capsule (12.5 mg total) by mouth daily.     IRON PO  Take 1 tablet by mouth daily.     labetalol 100 MG tablet  Commonly known as:  NORMODYNE  Take 1 tablet by mouth  twice a day     labetalol 100 MG tablet  Commonly known as:  NORMODYNE  Take 1 tablet (100 mg total) by mouth 2 (two) times daily.     metFORMIN 1000 MG tablet  Commonly known as:  GLUCOPHAGE  Take 1 tablet by mouth  twice a day     metFORMIN 1000 MG tablet  Commonly known as:  GLUCOPHAGE  TAKE 1 TABLET BY MOUTH TWICE A DAY     methotrexate 2.5 MG tablet  Commonly known as:  RHEUMATREX  Take 15 mg by mouth once a week. Patient takes 3 tablets twice once a week to add up to be 6 tablets once weekly, starts with 3 tablets in the evening and takes 3 more tablets in the morning     NUCYNTA 50 MG Tabs tablet  Generic drug:  tapentadol  Take 50 mg by mouth as needed for severe pain (spasms).     potassium chloride SA 20 MEQ tablet  Commonly known as:  K-DUR,KLOR-CON  Take 1 tablet (20 mEq total) by mouth daily.     pravastatin 80 MG tablet  Commonly known as:  PRAVACHOL  Take 1 tablet by  mouth  daily     spironolactone  100 MG tablet  Commonly known as:  ALDACTONE  Take 1 tablet by mouth  daily     spironolactone 100 MG tablet  Commonly known as:  ALDACTONE  TAKE 1 TABLET (100 MG TOTAL) BY MOUTH DAILY.     vitamin B-12 1000 MCG tablet  Commonly known as:  CYANOCOBALAMIN  Take 1,000 mcg by mouth daily.        Allergies:  Allergies  Allergen Reactions  . Acetaminophen Nausea Only  . Actos [Pioglitazone Hydrochloride]     edema  . Aspirin Nausea Only  . Atorvastatin Other (See Comments)    Felt drunk/high floating  . Morphine Sulfate Other (See Comments)    Burns injecting "arm is on fire"  . Naproxen Sodium Nausea Only  . Sitagliptin Phosphate Nausea And Vomiting    Past Medical History  Diagnosis Date  . Hypertension   . Diabetes mellitus     TYPE 2  . Hyperkalemia   . RA (rheumatoid arthritis) (HCC)   . Insomnia   . Dyslipidemia   . Allergic rhinitis     Past Surgical History  Procedure Laterality Date  . Hand tendon surgery      Right    Family History  Problem Relation Age of Onset  . Diabetes Mother   . Diabetes Father   . Diabetes Sister     Social History:  reports that she has never smoked. She does not have any smokeless tobacco history on file. She reports that she does not drink alcohol or use illicit drugs.  Review of Systems:  Today she is complaining about long-standing but recently worse symptoms of discomfort and sensitivity in her right big toe.  This is sometimes according on walking but also she has extreme sensitivity for any abject touching the end of her toe and the toenail.  No sharp pains, tingling, numbness or burning sensation.  No injury or swelling. Also otherwise it does not have any burning, numbness or feeling in her feet  History of rheumatoid arthritis on  Methotrexate now   Vitamin D deficiency: Currently taking vitamin D 3, 1000 U daily  LIPIDS: Taking Zetia from PCP and also pravastatin 80  mg.  Lab Results  Component Value Date   CHOL 149 09/15/2015   HDL 77 09/15/2015   LDLCALC 63 09/15/2015   TRIG 47 09/15/2015   CHOLHDL 1.9 09/15/2015       Examination:   BP 128/74 mmHg  Pulse 65  Temp(Src) 97.7 F (36.5 C)  Resp 14  Ht 5\' 4"  (1.626 m)  Wt 157 lb (71.215 kg)  BMI 26.94 kg/m2  SpO2 98%  Body mass index is 26.94 kg/(m^2).    No ankle edema.   Diabetic Foot Exam - Simple   Simple Foot Form  Diabetic Foot exam was performed with the following findings:  Yes 01/26/2016 11:09 AM  Visual Inspection  No deformities, no ulcerations, no other skin breakdown bilaterally:  Yes  Sensation Testing  Intact to touch and monofilament testing bilaterally:  Yes  Pulse Check  Posterior Tibialis and Dorsalis pulse intact bilaterally:  Yes  Comments  Great toe on the right has a thickened and ingrowing nail with significant convexity        Assesment/PLAN:  Hypokalemia: She has a stable potassium level with 100 mg Aldactone and using only 1 tablet of potassium with this She does not have hyperaldosteronism, likely has renal potassium wasting She will continue her 100 mg of Aldactone Since potassium is still  3.9 she can continue taking 20 meq potassium daily  Hypertension: She is on a 3 drug regimen of  Aldactone, labetalol and doxazosin and blood pressure is controlled with this, especially with continuing Aldactone   Since she has no microalbuminuria will hold off on any ACE inhibitor or an ARB  DIABETES: Well controlled and A1c is excellent at 6.0 She is on metformin alone with adequate control She has lost weight She is doing very well with diet and usually is compliant with exercise regimen when she can  Blood sugars are excellent at home and she is checking readings after supper mostly Previously fasting readings had been normal Her glucose monitored date and time was reprogrammed today  RIGHT toe pain: This does not appear to be neuropathy and is likely  to be from ingrained toenail.  Advised her to see a podiatrist and referral was done  Counseling time on subjects discussed above is over 50% of today's 25 minute visit    follow-up in 4 months     Saeed Toren 01/26/2016, 9:28 PM

## 2016-02-09 ENCOUNTER — Ambulatory Visit (INDEPENDENT_AMBULATORY_CARE_PROVIDER_SITE_OTHER): Payer: PPO | Admitting: Sports Medicine

## 2016-02-09 ENCOUNTER — Encounter: Payer: Self-pay | Admitting: Sports Medicine

## 2016-02-09 ENCOUNTER — Telehealth: Payer: Self-pay | Admitting: *Deleted

## 2016-02-09 DIAGNOSIS — B351 Tinea unguium: Secondary | ICD-10-CM | POA: Diagnosis not present

## 2016-02-09 DIAGNOSIS — E119 Type 2 diabetes mellitus without complications: Secondary | ICD-10-CM | POA: Diagnosis not present

## 2016-02-09 DIAGNOSIS — R52 Pain, unspecified: Secondary | ICD-10-CM

## 2016-02-09 DIAGNOSIS — I739 Peripheral vascular disease, unspecified: Secondary | ICD-10-CM

## 2016-02-09 DIAGNOSIS — M79674 Pain in right toe(s): Secondary | ICD-10-CM | POA: Diagnosis not present

## 2016-02-09 DIAGNOSIS — L600 Ingrowing nail: Secondary | ICD-10-CM | POA: Diagnosis not present

## 2016-02-09 NOTE — Progress Notes (Signed)
Patient ID: April [name], female   DOB: 02-26-1941, 75 y.o.   MRN: [id number] Subjective: April [name] is a 75 y.o. female patient with history of type 2 diabetes who presents to office today complaining of right big toenail pain at medial side; states that the area is tender to touch when it grows thick; Admits that she has pain in both legs consisting of cramping can not walk more than 5 blocks before she has the symptoms in both legs; Admits to rest pain or pain at night that requires her to hang legs off of bed for relief. Patient states that the glucose reading this morning was not recorded. Patient denies any new changes in medication or new problems. Patient denies any new cramping, numbness, burning or tingling in the legs.  Patient Active Problem List   Diagnosis Date Noted  . Encounter for long-term (current) use of other medications 02/29/2012  . Hypertension   . Diabetes mellitus   . Hypokalemia   . RA (rheumatoid arthritis) (HCC)   . EDEMA 08/27/2010  . CARPAL TUNNEL SYNDROME, BILATERAL 10/28/2009  . NUMBNESS 10/28/2009  . Diabetes mellitus without complication (HCC) 09/20/2007  . Hyperlipidemia 09/19/2007  . ALLERGIC RHINITIS 09/19/2007  . INSOMNIA 09/19/2007   Current Outpatient Prescriptions on File Prior to Visit  Medication Sig Dispense Refill  . ACCU-CHEK COMPACT PLUS test strip Use to check blood sugar once a day dx code E11.9 100 each 1  . ACCU-CHEK SOFTCLIX LANCETS lancets Use to check blood sugar once a day dx code E11.9 100 each 1  . doxazosin (CARDURA) 2 MG tablet Take 1 tablet by mouth at  bedtime 90 tablet 1  . ezetimibe (ZETIA) 10 MG tablet Take 10 mg by mouth daily.      . folic acid (FOLVITE) 0.5 MG tablet Take 0.5 mg by mouth daily.     [name] gabapentin (NEURONTIN) 100 MG capsule Take 1 capsule by mouth two times daily 180 capsule 1  . hydrochlorothiazide (HYDRODIURIL) 25 MG tablet Take 12.5 mg by mouth daily.    . hydrochlorothiazide (MICROZIDE)  12.5 MG capsule     . hydrochlorothiazide (MICROZIDE) 12.5 MG capsule Take 1 capsule (12.5 mg total) by mouth daily. 90 capsule 1  . IRON PO Take 1 tablet by mouth daily.    [name] labetalol (NORMODYNE) 100 MG tablet Take 1 tablet by mouth  twice a day 180 tablet 1  . labetalol (NORMODYNE) 100 MG tablet Take 1 tablet (100 mg total) by mouth 2 (two) times daily. 180 tablet 0  . metFORMIN (GLUCOPHAGE) 1000 MG tablet Take 1 tablet by mouth  twice a day 180 tablet 1  . metFORMIN (GLUCOPHAGE) 1000 MG tablet TAKE 1 TABLET BY MOUTH TWICE A DAY 180 tablet 1  . methotrexate (RHEUMATREX) 2.5 MG tablet Take 15 mg by mouth once a week. Patient takes 3 tablets twice once a week to add up to be 6 tablets once weekly, starts with 3 tablets in the evening and takes 3 more tablets in the morning    . potassium chloride SA (K-DUR,KLOR-CON) 20 MEQ tablet Take 1 tablet (20 mEq total) by mouth daily. 90 tablet 1  . pravastatin (PRAVACHOL) 80 MG tablet Take 1 tablet by mouth  daily 90 tablet 1  . spironolactone (ALDACTONE) 100 MG tablet Take 1 tablet by mouth  daily 90 tablet 1  . spironolactone (ALDACTONE) 100 MG tablet TAKE 1 TABLET (100 MG TOTAL) BY MOUTH DAILY. 90 tablet 0  . tapentadol (  NUCYNTA) 50 MG TABS tablet Take 50 mg by mouth as needed for severe pain (spasms).     . vitamin B-12 (CYANOCOBALAMIN) 1000 MCG tablet Take 1,000 mcg by mouth daily.     No current facility-administered medications on file prior to visit.   Allergies  Allergen Reactions  . Acetaminophen Nausea Only  . Actos [Pioglitazone Hydrochloride]     edema  . Aspirin Nausea Only  . Atorvastatin Other (See Comments)    Felt drunk/high floating  . Morphine Sulfate Other (See Comments)    Burns injecting "arm is on fire"  . Naproxen Sodium Nausea Only  . Sitagliptin Phosphate Nausea And Vomiting    Recent Results (from the past 2160 hour(s))  Hemoglobin A1c     Status: None   Collection Time: 01/21/16  9:17 AM  Result Value Ref Range    Hgb A1c MFr Bld 6.0 4.6 - 6.5 %    Comment: Glycemic Control Guidelines for People with Diabetes:Non Diabetic:  <6%Goal of Therapy: <7%Additional Action Suggested:  >8%   Comprehensive metabolic panel     Status: None   Collection Time: 01/21/16  9:17 AM  Result Value Ref Range   Sodium 138 135 - 145 mEq/L   Potassium 3.9 3.5 - 5.1 mEq/L   Chloride 102 96 - 112 mEq/L   CO2 28 19 - 32 mEq/L   Glucose, Bld 92 70 - 99 mg/dL   BUN 19 6 - 23 mg/dL   Creatinine, Ser 4.50 0.40 - 1.20 mg/dL   Total Bilirubin 0.5 0.2 - 1.2 mg/dL   Alkaline Phosphatase 72 39 - 117 U/L   AST 19 0 - 37 U/L   ALT 16 0 - 35 U/L   Total Protein 7.4 6.0 - 8.3 g/dL   Albumin 4.5 3.5 - 5.2 g/dL   Calcium 9.8 8.4 - 38.8 mg/dL   GFR 82.80 >03.49 mL/min  Microalbumin / creatinine urine ratio     Status: Abnormal   Collection Time: 01/21/16  9:17 AM  Result Value Ref Range   Microalb, Ur 2.7 (H) 0.0 - 1.9 mg/dL   Creatinine,U 179.1 mg/dL   Microalb Creat Ratio 0.9 0.0 - 30.0 mg/g    Objective: General: Patient is awake, alert, and oriented x 3 and in no acute distress.  Integument: Skin is warm, dry, and non-supple bilateral with trophic hyperpigmentation. Right hallux nail is mildly thickened and elongated with slight incurvation at distal medial margin with no infection, All other Nails are short and thickened but well manicured. No open lesions or preulcerative lesions present bilateral. Remaining integument unremarkable.  Vasculature:  Dorsalis Pedis pulse 2/4 bilateral. Posterior Tibial pulse  0/4 bilateral.  Capillary fill time <4 sec 1-5 bilateral. No hair growth to the level of the digits. Temperature gradient within normal limits. No varicosities present bilateral. Chronic skin changes bilateral.   Neurology: The patient has intact sensation measured with a 5.07/10g Semmes Weinstein Monofilament at all pedal sites bilateral . Vibratory sensation intact bilateral with tuning fork. No Babinski sign present  bilateral.   Musculoskeletal: Mild lesser hammetoe gross pedal deformities noted bilateral. Muscular strength 5/5 in all lower extremity muscular groups bilateral without limitation on range of motion . No tenderness with calf compression bilateral.  Assessment and Plan: Problem List Items Addressed This Visit      Endocrine   Diabetes mellitus without complication (HCC)    Other Visit Diagnoses    Ingrown nail    -  Primary    Toe  pain, right        Peripheral arterial disease (HCC)           -Examined patient. -Discussed and educated patient on diabetic foot care, especially with  regards to the vascular, neurological and musculoskeletal systems.  -Stressed the importance of good glycemic control and the detriment of not  controlling glucose levels in relation to the foot. -Mechanically debrided right hallux nail using sterile nipper without incident -Recommend vascular lab test for concerning caluadication symptoms -Answered all patient questions -Patient to return after vascular studies -Patient advised to call the office if any problems or questions arise in the  Meantime.  Asencion Islam, DPM

## 2016-02-09 NOTE — Telephone Encounter (Signed)
-----   Message from Asencion Islam, [place] sent at 02/09/2016  2:22 PM EST ----- Order ABI/PVR bilateral PAD with claudication and now rest pain Thanks Dr. Marylene Land

## 2016-02-11 ENCOUNTER — Other Ambulatory Visit: Payer: Self-pay | Admitting: Sports Medicine

## 2016-02-11 DIAGNOSIS — R52 Pain, unspecified: Secondary | ICD-10-CM

## 2016-02-11 DIAGNOSIS — M0589 Other rheumatoid arthritis with rheumatoid factor of multiple sites: Secondary | ICD-10-CM | POA: Diagnosis not present

## 2016-02-11 DIAGNOSIS — I739 Peripheral vascular disease, unspecified: Secondary | ICD-10-CM

## 2016-02-11 DIAGNOSIS — M2550 Pain in unspecified joint: Secondary | ICD-10-CM | POA: Diagnosis not present

## 2016-02-11 DIAGNOSIS — R5382 Chronic fatigue, unspecified: Secondary | ICD-10-CM | POA: Diagnosis not present

## 2016-02-11 DIAGNOSIS — Z79899 Other long term (current) drug therapy: Secondary | ICD-10-CM | POA: Diagnosis not present

## 2016-02-11 DIAGNOSIS — M150 Primary generalized (osteo)arthritis: Secondary | ICD-10-CM | POA: Diagnosis not present

## 2016-02-12 ENCOUNTER — Other Ambulatory Visit: Payer: Medicare Other

## 2016-02-15 ENCOUNTER — Ambulatory Visit (HOSPITAL_COMMUNITY)
Admission: RE | Admit: 2016-02-15 | Discharge: 2016-02-15 | Disposition: A | Discharge status: Home / Self Care | Payer: PPO | Source: Ambulatory Visit | Attending: Cardiovascular Disease | Admitting: Cardiovascular Disease

## 2016-02-15 DIAGNOSIS — I739 Peripheral vascular disease, unspecified: Secondary | ICD-10-CM | POA: Insufficient documentation

## 2016-02-15 DIAGNOSIS — I10 Essential (primary) hypertension: Secondary | ICD-10-CM | POA: Diagnosis not present

## 2016-02-15 DIAGNOSIS — R52 Pain, unspecified: Secondary | ICD-10-CM | POA: Diagnosis not present

## 2016-02-15 DIAGNOSIS — E785 Hyperlipidemia, unspecified: Secondary | ICD-10-CM | POA: Insufficient documentation

## 2016-02-15 DIAGNOSIS — E119 Type 2 diabetes mellitus without complications: Secondary | ICD-10-CM | POA: Insufficient documentation

## 2016-02-17 ENCOUNTER — Ambulatory Visit: Payer: Medicare Other | Admitting: Endocrinology

## 2016-02-18 ENCOUNTER — Other Ambulatory Visit: Payer: Medicare Other

## 2016-03-20 ENCOUNTER — Other Ambulatory Visit: Payer: Self-pay | Admitting: Endocrinology

## 2016-03-28 ENCOUNTER — Ambulatory Visit (INDEPENDENT_AMBULATORY_CARE_PROVIDER_SITE_OTHER): Payer: PPO | Admitting: Sports Medicine

## 2016-03-28 ENCOUNTER — Encounter: Payer: Self-pay | Admitting: Sports Medicine

## 2016-03-28 ENCOUNTER — Telehealth: Payer: Self-pay | Admitting: *Deleted

## 2016-03-28 DIAGNOSIS — M79675 Pain in left toe(s): Secondary | ICD-10-CM

## 2016-03-28 DIAGNOSIS — Z111 Encounter for screening for respiratory tuberculosis: Secondary | ICD-10-CM | POA: Diagnosis not present

## 2016-03-28 DIAGNOSIS — M79674 Pain in right toe(s): Secondary | ICD-10-CM

## 2016-03-28 DIAGNOSIS — I739 Peripheral vascular disease, unspecified: Secondary | ICD-10-CM

## 2016-03-28 DIAGNOSIS — E1142 Type 2 diabetes mellitus with diabetic polyneuropathy: Secondary | ICD-10-CM

## 2016-03-28 MED ORDER — NONFORMULARY OR COMPOUNDED ITEM: Status: DC

## 2016-03-28 NOTE — Progress Notes (Signed)
Patient ID: April [name], female   DOB: February 05, 1941, 75 y.o.   MRN: [id number]  Subjective: April [name] is a 75 y.o. female patient with history of type 2 diabetes who returns to office today complaining of right>left big toe pain burning in nature with still pain in legs with extensive walking. Patient states that the glucose reading yesterday was 148mg /dl. Patient denies any new changes in medication or new problems.   Patient Active Problem List   Diagnosis Date Noted  . Encounter for long-term (current) use of other medications 02/29/2012  . Hypertension   . Diabetes mellitus   . Hypokalemia   . RA (rheumatoid arthritis) (HCC)   . EDEMA 08/27/2010  . CARPAL TUNNEL SYNDROME, BILATERAL 10/28/2009  . NUMBNESS 10/28/2009  . Diabetes mellitus without complication (HCC) 09/20/2007  . Hyperlipidemia 09/19/2007  . ALLERGIC RHINITIS 09/19/2007  . INSOMNIA 09/19/2007   Current Outpatient Prescriptions on File Prior to Visit  Medication Sig Dispense Refill  . ACCU-CHEK COMPACT PLUS test strip Use to check blood sugar once a day dx code E11.9 100 each 1  . ACCU-CHEK SOFTCLIX LANCETS lancets Use to check blood sugar once a day dx code E11.9 100 each 1  . doxazosin (CARDURA) 2 MG tablet Take 1 tablet by mouth at  bedtime 90 tablet 1  . ezetimibe (ZETIA) 10 MG tablet Take 10 mg by mouth daily.      . folic acid (FOLVITE) 0.5 MG tablet Take 0.5 mg by mouth daily.     [name] gabapentin (NEURONTIN) 100 MG capsule TAKE 1 CAPSULE BY MOUTH TWO TIMES DAILY 180 capsule 1  . hydrochlorothiazide (HYDRODIURIL) 25 MG tablet Take 12.5 mg by mouth daily.    . hydrochlorothiazide (MICROZIDE) 12.5 MG capsule     . hydrochlorothiazide (MICROZIDE) 12.5 MG capsule Take 1 capsule (12.5 mg total) by mouth daily. 90 capsule 1  . IRON PO Take 1 tablet by mouth daily.    [name] labetalol (NORMODYNE) 100 MG tablet Take 1 tablet by mouth  twice a day 180 tablet 1  . labetalol (NORMODYNE) 100 MG tablet Take 1 tablet  (100 mg total) by mouth 2 (two) times daily. 180 tablet 0  . metFORMIN (GLUCOPHAGE) 1000 MG tablet Take 1 tablet by mouth  twice a day 180 tablet 1  . metFORMIN (GLUCOPHAGE) 1000 MG tablet TAKE 1 TABLET BY MOUTH TWICE A DAY 180 tablet 1  . methotrexate (RHEUMATREX) 2.5 MG tablet Take 15 mg by mouth once a week. Patient takes 3 tablets twice once a week to add up to be 6 tablets once weekly, starts with 3 tablets in the evening and takes 3 more tablets in the morning    . potassium chloride SA (K-DUR,KLOR-CON) 20 MEQ tablet Take 1 tablet (20 mEq total) by mouth daily. 90 tablet 1  . pravastatin (PRAVACHOL) 80 MG tablet Take 1 tablet by mouth  daily 90 tablet 1  . spironolactone (ALDACTONE) 100 MG tablet Take 1 tablet by mouth  daily 90 tablet 1  . spironolactone (ALDACTONE) 100 MG tablet TAKE 1 TABLET (100 MG TOTAL) BY MOUTH DAILY. 90 tablet 0  . tapentadol (NUCYNTA) 50 MG TABS tablet Take 50 mg by mouth as needed for severe pain (spasms).     . vitamin B-12 (CYANOCOBALAMIN) 1000 MCG tablet Take 1,000 mcg by mouth daily.     No current facility-administered medications on file prior to visit.   Allergies  Allergen Reactions  . Acetaminophen Nausea Only  . Actos [Pioglitazone  Hydrochloride]     edema  . Aspirin Nausea Only  . Atorvastatin Other (See Comments)    Felt drunk/high floating  . Morphine Sulfate Other (See Comments)    Burns injecting "arm is on fire"  . Naproxen Sodium Nausea Only  . Sitagliptin Phosphate Nausea And Vomiting    Recent Results (from the past 2160 hour(s))  Hemoglobin A1c     Status: None   Collection Time: 01/21/16  9:17 AM  Result Value Ref Range   Hgb A1c MFr Bld 6.0 4.6 - 6.5 %    Comment: Glycemic Control Guidelines for People with Diabetes:Non Diabetic:  <6%Goal of Therapy: <7%Additional Action Suggested:  >8%   Comprehensive metabolic panel     Status: None   Collection Time: 01/21/16  9:17 AM  Result Value Ref Range   Sodium 138 135 - 145 mEq/L    Potassium 3.9 3.5 - 5.1 mEq/L   Chloride 102 96 - 112 mEq/L   CO2 28 19 - 32 mEq/L   Glucose, Bld 92 70 - 99 mg/dL   BUN 19 6 - 23 mg/dL   Creatinine, Ser 6.70 0.40 - 1.20 mg/dL   Total Bilirubin 0.5 0.2 - 1.2 mg/dL   Alkaline Phosphatase 72 39 - 117 U/L   AST 19 0 - 37 U/L   ALT 16 0 - 35 U/L   Total Protein 7.4 6.0 - 8.3 g/dL   Albumin 4.5 3.5 - 5.2 g/dL   Calcium 9.8 8.4 - 14.1 mg/dL   GFR 03.01 >31.43 mL/min  Microalbumin / creatinine urine ratio     Status: Abnormal   Collection Time: 01/21/16  9:17 AM  Result Value Ref Range   Microalb, Ur 2.7 (H) 0.0 - 1.9 mg/dL   Creatinine,U 888.7 mg/dL   Microalb Creat Ratio 0.9 0.0 - 30.0 mg/g    Objective: General: Patient is awake, alert, and oriented x 3 and in no acute distress.  Integument: Skin is warm, dry, and non-supple bilateral with trophic hyperpigmentation.  Nails are short and thickened but well manicured. No open lesions or preulcerative lesions present bilateral. Remaining integument unremarkable.  Vasculature:  Dorsalis Pedis pulse 2/4 bilateral. Posterior Tibial pulse  0/4 bilateral. No cyanosis. No gangrene.  Capillary fill time <4 sec 1-5 bilateral. No hair growth to the level of the digits. Temperature gradient within normal limits. No varicosities present bilateral. Chronic skin changes bilateral.   Neurology: The patient has intact sensation measured with a 5.07/10g Semmes Weinstein Monofilament at all pedal sites bilateral . Vibratory sensation intact bilateral with tuning fork. No Babinski sign present bilateral. Subjective burning to toes.   Musculoskeletal: Mild lesser hammetoe gross pedal deformities noted bilateral. Muscular strength 5/5 in all lower extremity muscular groups bilateral without limitation on range of motion . No tenderness with calf compression bilateral.  Assessment and Plan: Problem List Items Addressed This Visit      Endocrine   Diabetes mellitus without complication (HCC)    Other  Visit Diagnoses    Toe pain, bilateral    -  Primary    Peripheral arterial disease (HCC)           -Examined patient. -Discussed and educated patient on diabetic foot care, especially with  regards to the vascular, neurological and musculoskeletal systems.  -Stressed the importance of good glycemic control and the detriment of not  controlling glucose levels in relation to the foot. -Vascular ABIs reviewed; Within normal limits -Encouraged daily range of motion exercises and walking to  tolerance -Recommend good supportive shoes and inserts daily  -Rx topical neuropathy cream ([facility]) -Answered all patient questions -Patient to return in 3 months for diabetic foot examination  -Patient advised to call the office if any problems or questions arise in the  Meantime.  Asencion Islam, DPM

## 2016-03-28 NOTE — Patient Instructions (Signed)
EXERCISES STRETCH - Gastroc, Standing   Place hands on wall.  Extend right / left leg, keeping the front knee somewhat bent.  Slightly point your toes inward on your back foot.  Keeping your right / left heel on the floor and your knee straight, shift your weight toward the wall, not allowing your back to arch.  You should feel a gentle stretch in the right / left calf. Hold this position for __________ seconds. Repeat __________ times. Complete this stretch __________ times per day. STRETCH - Soleus, Standing   Place hands on wall.  Extend right / left leg, keeping the other knee somewhat bent.  Slightly point your toes inward on your back foot.  Keep your right / left heel on the floor, bend your back knee, and slightly shift your weight over the back leg so that you feel a gentle stretch deep in your back calf.  Hold this position for __________ seconds. Repeat __________ times. Complete this stretch __________ times per day. STRETCH - Gastrocsoleus, Standing  Note: This exercise can place a lot of stress on your foot and ankle. Please complete this exercise only if specifically instructed by your caregiver.   Place the ball of your right / left foot on a step, keeping your other foot firmly on the same step.  Hold on to the wall or a rail for balance.  Slowly lift your other foot, allowing your body weight to press your heel down over the edge of the step.  You should feel a stretch in your right / left calf.  Hold this position for __________ seconds.  Repeat this exercise with a slight bend in your knee. Repeat __________ times. Complete this stretch __________ times per day.  STRENGTHENING EXERCISES - Achilles Tendinitis These exercises may help you when beginning to rehabilitate your injury. They may resolve your symptoms with or without further involvement from your physician, physical therapist or athletic trainer. While completing these exercises, remember:    Muscles can gain both the endurance and the strength needed for everyday activities through controlled exercises.  Complete these exercises as instructed by your physician, physical therapist or athletic trainer. Progress the resistance and repetitions only as guided.  You may experience muscle soreness or fatigue, but the pain or discomfort you are trying to eliminate should never worsen during these exercises. If this pain does worsen, stop and make certain you are following the directions exactly. If the pain is still present after adjustments, discontinue the exercise until you can discuss the trouble with your clinician. STRENGTH - Plantar-flexors   Sit with your right / left leg extended. Holding onto both ends of a rubber exercise band/tubing, loop it around the ball of your foot. Keep a slight tension in the band.  Slowly push your toes away from you, pointing them downward.  Hold this position for __________ seconds. Return slowly, controlling the tension in the band/tubing. Repeat __________ times. Complete this exercise __________ times per day.  STRENGTH - Plantar-flexors   Stand with your feet shoulder width apart. Steady yourself with a wall or table using as little support as needed.  Keeping your weight evenly spread over the width of your feet, rise up on your toes.*  Hold this position for __________ seconds. Repeat __________ times. Complete this exercise __________ times per day.  *If this is too easy, shift your weight toward your right / left leg until you feel challenged. Ultimately, you may be asked to do this exercise with your  right / left foot only. STRENGTH - Plantar-flexors, Eccentric  Note: This exercise can place a lot of stress on your foot and ankle. Please complete this exercise only if specifically instructed by your caregiver.   Place the balls of your feet on a step. With your hands, use only enough support from a wall or rail to keep your  balance.  Keep your knees straight and rise up on your toes.  Slowly shift your weight entirely to your right / left toes and pick up your opposite foot. Gently and with controlled movement, lower your weight through your right / left foot so that your heel drops below the level of the step. You will feel a slight stretch in the back of your calf at the end position.  Use the healthy leg to help rise up onto the balls of both feet, then lower weight only on the right / left leg again. Build up to 15 repetitions. Then progress to 3 consecutive sets of 15 repetitions.*  After completing the above exercise, complete the same exercise with a slight knee bend (about 30 degrees). Again, build up to 15 repetitions. Then progress to 3 consecutive sets of 15 repetitions.* Perform this exercise __________ times per day.  *When you easily complete 3 sets of 15, your physician, physical therapist or athletic trainer may advise you to add resistance by wearing a backpack filled with additional weight. STRENGTH - Plantar Flexors, Seated   Sit on a chair that allows your feet to rest flat on the ground. If necessary, sit at the edge of the chair.  Keeping your toes firmly on the ground, lift your right / left heel as far as you can without increasing any discomfort in your ankle. Repeat __________ times. Complete this exercise __________ times a day. *If instructed by your physician, physical therapist or athletic trainer, you may add ____________________ of resistance by placing a weighted object on your right / left knee.   This information is not intended to replace advice given to you by your health care provider. Make sure you discuss any questions you have with your health care provider.   Document Released: 06/22/2005 Document Revised: 12/12/2014 Document Reviewed: 03/05/2009 Elsevier Interactive Patient Education [employer]

## 2016-03-28 NOTE — Telephone Encounter (Addendum)
-----   Message from Asencion Islam, [place] sent at 03/28/2016  2:11 PM EDT ----- Regarding: Neuropathy cream Please Rx [facility] neuropathy cream Thanks Dr. Marylene Land.  Faxed.

## 2016-03-29 NOTE — Telephone Encounter (Signed)
Entered in error

## 2016-04-08 ENCOUNTER — Other Ambulatory Visit: Payer: Self-pay | Admitting: Endocrinology

## 2016-05-07 ENCOUNTER — Other Ambulatory Visit: Payer: Self-pay | Admitting: Endocrinology

## 2016-05-09 ENCOUNTER — Ambulatory Visit: Payer: PPO | Admitting: Podiatry

## 2016-05-10 ENCOUNTER — Other Ambulatory Visit: Payer: Self-pay | Admitting: Endocrinology

## 2016-05-13 DIAGNOSIS — M2550 Pain in unspecified joint: Secondary | ICD-10-CM | POA: Diagnosis not present

## 2016-05-13 DIAGNOSIS — M0589 Other rheumatoid arthritis with rheumatoid factor of multiple sites: Secondary | ICD-10-CM | POA: Diagnosis not present

## 2016-05-13 DIAGNOSIS — R5382 Chronic fatigue, unspecified: Secondary | ICD-10-CM | POA: Diagnosis not present

## 2016-05-13 DIAGNOSIS — M150 Primary generalized (osteo)arthritis: Secondary | ICD-10-CM | POA: Diagnosis not present

## 2016-05-23 ENCOUNTER — Other Ambulatory Visit (INDEPENDENT_AMBULATORY_CARE_PROVIDER_SITE_OTHER): Payer: PPO

## 2016-05-23 DIAGNOSIS — E119 Type 2 diabetes mellitus without complications: Secondary | ICD-10-CM | POA: Diagnosis not present

## 2016-05-23 LAB — COMPREHENSIVE METABOLIC PANEL
ALT: 23 U/L (ref 0–35)
AST: 25 U/L (ref 0–37)
Albumin: 4.4 g/dL (ref 3.5–5.2)
Alkaline Phosphatase: 71 U/L (ref 39–117)
BUN: 26 mg/dL — ABNORMAL HIGH (ref 6–23)
CO2: 23 mEq/L (ref 19–32)
Calcium: 9.7 mg/dL (ref 8.4–10.5)
Chloride: 103 mEq/L (ref 96–112)
Creatinine, Ser: 0.98 mg/dL (ref 0.40–1.20)
GFR: 71.55 mL/min (ref >60.00)
Glucose, Bld: 92 mg/dL (ref 70–99)
Potassium: 3.9 mEq/L (ref 3.5–5.1)
Sodium: 137 mEq/L (ref 135–145)
Total Bilirubin: 0.6 mg/dL (ref 0.2–1.2)
Total Protein: 7.3 g/dL (ref 6.0–8.3)

## 2016-05-23 LAB — HEMOGLOBIN A1C: Hgb A1c MFr Bld: 5.8 % (ref 4.6–6.5)

## 2016-05-26 ENCOUNTER — Ambulatory Visit (INDEPENDENT_AMBULATORY_CARE_PROVIDER_SITE_OTHER): Payer: PPO | Admitting: Endocrinology

## 2016-05-26 ENCOUNTER — Encounter: Payer: Self-pay | Admitting: Endocrinology

## 2016-05-26 VITALS — BP 122/64 | HR 74 | Ht 64.0 in | Wt 159.0 lb

## 2016-05-26 DIAGNOSIS — E876 Hypokalemia: Secondary | ICD-10-CM | POA: Diagnosis not present

## 2016-05-26 DIAGNOSIS — E119 Type 2 diabetes mellitus without complications: Secondary | ICD-10-CM | POA: Diagnosis not present

## 2016-05-26 DIAGNOSIS — I10 Essential (primary) hypertension: Secondary | ICD-10-CM

## 2016-05-26 NOTE — Progress Notes (Signed)
Patient ID: April [name], female   DOB: May 31, 1941, 75 y.o.   MRN: [id number]   Reason for Appointment: follow-up of various problems  History of Present Illness    HYPERTENSION:  diagnosed around 1979 with symptoms of headaches Previously she was taking Avapro, clonidine 0.6 at bedtime and Dyazide but blood pressure was relatively high with this regimen For evaluation of her hyperaldosteronism she was switched to labetalol 100 mg twice a day, Tenex 2 mg and doxazosin With this regimen her blood pressure has been much better She was subsequently started on Aldactone to help with her severe hypokalemia  CURRENTLY she is on 100 mg Aldactone, 12.5 mg hydrochlorothiazide, doxazosin 2 mg and labetalol 100 mg twice a day  She has checked her blood pressure at the drug store and it is mostly normal    She does not complain of lightheadedness on standing up She is compliant with her medications and blood pressure has been very well controlled  HYPOKALEMIA:  This previously had been a chronic problem and associated with hypertension  Previously had  been prescribed 6 tablets of potassium daily She was off her diuretics and Avapro when her evaluation for hyperaldosteronism was done, aldosterone level was 6.0 along with a relatively low renin level  She has a normal potassium level consistently with Aldactone 100 mg daily Currently  on only one potassium tablet daily with adequate control  Lab Results  Component Value Date   CREATININE 0.98 05/23/2016   BUN 26* 05/23/2016   NA 137 05/23/2016   K 3.9 05/23/2016   CL 103 05/23/2016   CO2 23 05/23/2016    DIABETES type II:  This has been  well controlled with a regimen of metformin 1 g twice a day only She is generally fairly good with diet and Regular walking regimen Usually able to keep her weight relatively low  A1c has been consistently upper normal, now 5.8  Home blood sugar range  90-117 She says she is  checking blood sugars around 9 PM    Wt Readings from Last 3 Encounters:  05/26/16 159 lb (72.122 kg)  01/26/16 157 lb (71.215 kg)  10/20/15 165 lb 9.6 oz (75.116 kg)      Lab Results  Component Value Date   HGBA1C 5.8 05/23/2016   HGBA1C 6.0 01/21/2016   HGBA1C 5.9 10/14/2015   Lab Results  Component Value Date   MICROALBUR 2.7* 01/21/2016   LDLCALC 63 09/15/2015   CREATININE 0.98 05/23/2016    OTHER active problems discussed today are in review of systems  LABS:  Lab on 05/23/2016  Component Date Value Ref Range Status  . Hgb A1c MFr Bld 05/23/2016 5.8  4.6 - 6.5 % Final   Glycemic Control Guidelines for People with Diabetes:Non Diabetic:  <6%Goal of Therapy: <7%Additional Action Suggested:  >8%   . Sodium 05/23/2016 137  135 - 145 mEq/L Final  . Potassium 05/23/2016 3.9  3.5 - 5.1 mEq/L Final  . Chloride 05/23/2016 103  96 - 112 mEq/L Final  . CO2 05/23/2016 23  19 - 32 mEq/L Final  . Glucose, Bld 05/23/2016 92  70 - 99 mg/dL Final  . BUN 24/26/8341 26* 6 - 23 mg/dL Final  . Creatinine, Ser 05/23/2016 0.98  0.40 - 1.20 mg/dL Final  . Total Bilirubin 05/23/2016 0.6  0.2 - 1.2 mg/dL Final  . Alkaline Phosphatase 05/23/2016 71  39 - 117 U/L Final  . AST 05/23/2016 25  0 - 37  U/L Final  . ALT 05/23/2016 23  0 - 35 U/L Final  . Total Protein 05/23/2016 7.3  6.0 - 8.3 g/dL Final  . Albumin 02/40/9735 4.4  3.5 - 5.2 g/dL Final  . Calcium 32/99/2426 9.7  8.4 - 10.5 mg/dL Final  . GFR 83/41/9622 71.55  >60.00 mL/min Final      Medication List       This list is accurate as of: 05/26/16  9:35 AM.  Always use your most recent med list.               ACCU-CHEK COMPACT PLUS test strip  Generic drug:  glucose blood  Use to check blood sugar once a day dx code E11.9     ACCU-CHEK SOFTCLIX LANCETS lancets  Use to check blood sugar once a day dx code E11.9     doxazosin 2 MG tablet  Commonly known as:  CARDURA  Take 1 tablet by mouth at  bedtime     ezetimibe 10  MG tablet  Commonly known as:  ZETIA  Take 10 mg by mouth daily.     folic acid 0.5 MG tablet  Commonly known as:  FOLVITE  Take 0.5 mg by mouth daily.     gabapentin 100 MG capsule  Commonly known as:  NEURONTIN  TAKE 1 CAPSULE BY MOUTH TWO TIMES DAILY     hydrochlorothiazide 12.5 MG capsule  Commonly known as:  MICROZIDE  Take 1 capsule (12.5 mg total) by mouth daily.     IRON PO  Take 1 tablet by mouth daily.     labetalol 100 MG tablet  Commonly known as:  NORMODYNE  Take 1 tablet by mouth  twice a day     metFORMIN 1000 MG tablet  Commonly known as:  GLUCOPHAGE  TAKE 1 TABLET BY MOUTH TWICE A DAY     methotrexate 2.5 MG tablet  Commonly known as:  RHEUMATREX  Take 15 mg by mouth once a week. Patient takes 3 tablets twice once a week to add up to be 6 tablets once weekly, starts with 3 tablets in the evening and takes 3 more tablets in the morning     NONFORMULARY OR COMPOUNDED ITEM  [facility] compound:  Peripheral Neuropathy cream - Bu[ovacaome 1%, Doxepin 3%, Gabapentin 6%, Pentoxifylline 3%, Topiramate 1%, dispense 120grams, apply 1-2 grams to affected area 3-4 times daily, + 3 refills.     NUCYNTA 50 MG Tabs tablet  Generic drug:  tapentadol  Take 50 mg by mouth as needed for severe pain (spasms).     potassium chloride SA 20 MEQ tablet  Commonly known as:  K-DUR,KLOR-CON  Take 1 tablet (20 mEq total) by mouth daily.     pravastatin 80 MG tablet  Commonly known as:  PRAVACHOL  Take 1 tablet by mouth  daily     spironolactone 100 MG tablet  Commonly known as:  ALDACTONE  Take 1 tablet by mouth  daily     vitamin B-12 1000 MCG tablet  Commonly known as:  CYANOCOBALAMIN  Take 1,000 mcg by mouth daily.        Allergies:  Allergies  Allergen Reactions  . Acetaminophen Nausea Only  . Actos [Pioglitazone Hydrochloride]     edema  . Aspirin Nausea Only  . Atorvastatin Other (See Comments)    Felt drunk/high floating  . Morphine Sulfate Other  (See Comments)    Burns injecting "arm is on fire"  . Naproxen Sodium Nausea Only  .  Sitagliptin Phosphate Nausea And Vomiting    Past Medical History  Diagnosis Date  . Hypertension   . Diabetes mellitus     TYPE 2  . Hyperkalemia   . RA (rheumatoid arthritis) (HCC)   . Insomnia   . Dyslipidemia   . Allergic rhinitis     Past Surgical History  Procedure Laterality Date  . Hand tendon surgery      Right    Family History  Problem Relation Age of Onset  . Diabetes Mother   . Diabetes Father   . Diabetes Sister     Social History:  reports that she has never smoked. She does not have any smokeless tobacco history on file. She reports that she does not drink alcohol or use illicit drugs.  Review of Systems:  She has had problems with being her legs and feet and etiology unclear, was seen by her rheumatologist and gabapentin was increased to 300 mg  History of rheumatoid arthritis on  Methotrexate   Vitamin D deficiency: Currently taking vitamin D 3, 1000 U daily  LIPIDS: Taking Zetia from PCP and also pravastatin 80 mg.  Lab Results  Component Value Date   CHOL 149 09/15/2015   HDL 77 09/15/2015   LDLCALC 63 09/15/2015   TRIG 47 09/15/2015   CHOLHDL 1.9 09/15/2015       Examination:   BP 122/64 mmHg  Pulse 74  Ht 5\' 4"  (1.626 m)  Wt 159 lb (72.122 kg)  BMI 27.28 kg/m2  SpO2 98%  Body mass index is 27.28 kg/(m^2).    No ankle edema.    Standing blood pressure 118/70  Assesment/PLAN:  Hypokalemia: She has a consistently normal potassium level with 100 mg Aldactone and using only 1 tablet of potassium  She does not have hyperaldosteronism, likely has renal potassium wasting She will continue  100 mg of Aldactone Since potassium is still 3.9 she can continue taking 20 meq potassium daily  Hypertension: She is on a 3 drug regimen of  Aldactone, labetalol and doxazosin and blood pressure is well controlled with this, especially with continuing  Aldactone   No orthostatic symptoms or change in blood pressure Since she has no microalbuminuria will hold off on any ACE inhibitor or an ARB  DIABETES: Well controlled and A1c is excellent at 5.8 now She is on metformin 2 g daily alone with adequate control She is doing very well with diet and usually is compliant with exercise regimen when she can  Blood sugars are excellent at home and she is checking readings after supper mostly Lab fasting reading glucose is normal  Leg pain:?  Neuropathy, not clear if etiology and she can continue gabapentin as this is helping    follow-up in 4 months     Kinneth Fujiwara 05/26/2016, 9:35 AM

## 2016-05-31 ENCOUNTER — Encounter: Payer: Self-pay | Admitting: Endocrinology

## 2016-05-31 DIAGNOSIS — M1990 Unspecified osteoarthritis, unspecified site: Secondary | ICD-10-CM | POA: Diagnosis not present

## 2016-05-31 DIAGNOSIS — Z1211 Encounter for screening for malignant neoplasm of colon: Secondary | ICD-10-CM | POA: Diagnosis not present

## 2016-05-31 DIAGNOSIS — E663 Overweight: Secondary | ICD-10-CM | POA: Diagnosis not present

## 2016-05-31 DIAGNOSIS — Z7984 Long term (current) use of oral hypoglycemic drugs: Secondary | ICD-10-CM | POA: Diagnosis not present

## 2016-05-31 DIAGNOSIS — I10 Essential (primary) hypertension: Secondary | ICD-10-CM | POA: Diagnosis not present

## 2016-05-31 DIAGNOSIS — J309 Allergic rhinitis, unspecified: Secondary | ICD-10-CM | POA: Diagnosis not present

## 2016-05-31 DIAGNOSIS — Z0000 Encounter for general adult medical examination without abnormal findings: Secondary | ICD-10-CM | POA: Diagnosis not present

## 2016-05-31 DIAGNOSIS — M069 Rheumatoid arthritis, unspecified: Secondary | ICD-10-CM | POA: Diagnosis not present

## 2016-05-31 DIAGNOSIS — Z6826 Body mass index (BMI) 26.0-26.9, adult: Secondary | ICD-10-CM | POA: Diagnosis not present

## 2016-05-31 DIAGNOSIS — E7800 Pure hypercholesterolemia, unspecified: Secondary | ICD-10-CM | POA: Diagnosis not present

## 2016-05-31 DIAGNOSIS — E1165 Type 2 diabetes mellitus with hyperglycemia: Secondary | ICD-10-CM | POA: Diagnosis not present

## 2016-06-06 ENCOUNTER — Other Ambulatory Visit: Payer: Self-pay

## 2016-06-06 MED ORDER — DOXAZOSIN MESYLATE 2 MG PO TABS: ORAL_TABLET | ORAL | Status: DC

## 2016-06-06 MED ORDER — PRAVASTATIN SODIUM 80 MG PO TABS: ORAL_TABLET | ORAL | Status: DC

## 2016-06-06 MED ORDER — METFORMIN HCL 1000 MG PO TABS: 1000.0000 mg | ORAL_TABLET | Freq: Two times a day (BID) | ORAL | Status: DC

## 2016-06-13 ENCOUNTER — Other Ambulatory Visit: Payer: Self-pay | Admitting: Endocrinology

## 2016-06-14 DIAGNOSIS — M2550 Pain in unspecified joint: Secondary | ICD-10-CM | POA: Diagnosis not present

## 2016-06-14 DIAGNOSIS — M150 Primary generalized (osteo)arthritis: Secondary | ICD-10-CM | POA: Diagnosis not present

## 2016-06-14 DIAGNOSIS — M0589 Other rheumatoid arthritis with rheumatoid factor of multiple sites: Secondary | ICD-10-CM | POA: Diagnosis not present

## 2016-06-14 DIAGNOSIS — R5382 Chronic fatigue, unspecified: Secondary | ICD-10-CM | POA: Diagnosis not present

## 2016-06-17 DIAGNOSIS — Z1231 Encounter for screening mammogram for malignant neoplasm of breast: Secondary | ICD-10-CM | POA: Diagnosis not present

## 2016-07-04 ENCOUNTER — Encounter: Payer: Self-pay | Admitting: Sports Medicine

## 2016-07-04 ENCOUNTER — Ambulatory Visit (INDEPENDENT_AMBULATORY_CARE_PROVIDER_SITE_OTHER): Payer: PPO | Admitting: Sports Medicine

## 2016-07-04 DIAGNOSIS — I739 Peripheral vascular disease, unspecified: Secondary | ICD-10-CM

## 2016-07-04 DIAGNOSIS — E1142 Type 2 diabetes mellitus with diabetic polyneuropathy: Secondary | ICD-10-CM

## 2016-07-04 DIAGNOSIS — M79674 Pain in right toe(s): Secondary | ICD-10-CM | POA: Diagnosis not present

## 2016-07-04 DIAGNOSIS — L84 Corns and callosities: Secondary | ICD-10-CM | POA: Diagnosis not present

## 2016-07-04 DIAGNOSIS — B351 Tinea unguium: Secondary | ICD-10-CM

## 2016-07-04 DIAGNOSIS — M79675 Pain in left toe(s): Secondary | ICD-10-CM

## 2016-07-04 NOTE — Progress Notes (Signed)
Patient ID: April [name], female   DOB: 10/18/1941, 75 y.o.   MRN: [id number]  Subjective: April [name] is a 75 y.o. female patient with history of type 2 diabetes who returns to office today complaining of continued right>left big toe pain burning in nature with still pain in legs with extensive walking or when she has to drive. Patient states that the glucose reading yesterday was "good". Patient denies any new changes in medication or new problems.   Patient Active Problem List   Diagnosis Date Noted  . Encounter for long-term (current) use of other medications 02/29/2012  . Hypertension   . Diabetes mellitus   . Hypokalemia   . RA (rheumatoid arthritis) (HCC)   . EDEMA 08/27/2010  . CARPAL TUNNEL SYNDROME, BILATERAL 10/28/2009  . NUMBNESS 10/28/2009  . Diabetes mellitus without complication (HCC) 09/20/2007  . Hyperlipidemia 09/19/2007  . ALLERGIC RHINITIS 09/19/2007  . INSOMNIA 09/19/2007   Current Outpatient Prescriptions on File Prior to Visit  Medication Sig Dispense Refill  . ACCU-CHEK COMPACT PLUS test strip Use to check blood sugar once a day dx code E11.9 100 each 1  . ACCU-CHEK SOFTCLIX LANCETS lancets Use to check blood sugar once a day dx code E11.9 100 each 1  . doxazosin (CARDURA) 2 MG tablet Take 1 tablet by mouth at  bedtime 90 tablet 1  . ezetimibe (ZETIA) 10 MG tablet Take 10 mg by mouth daily.      . folic acid (FOLVITE) 0.5 MG tablet Take 0.5 mg by mouth daily.     [name] gabapentin (NEURONTIN) 100 MG capsule TAKE 1 CAPSULE BY MOUTH TWO TIMES DAILY (Patient taking differently: TAKE 6 CAPSULES BY MOUTH TWO TIMES DAILY) 180 capsule 1  . hydrochlorothiazide (MICROZIDE) 12.5 MG capsule Take 1 capsule (12.5 mg total) by mouth daily. 90 capsule 1  . IRON PO Take 1 tablet by mouth daily.    [name] KLOR-CON M20 20 MEQ tablet TAKE 1 TABLET BY MOUTH DAILY. 90 tablet 0  . labetalol (NORMODYNE) 100 MG tablet Take 1 tablet by mouth  twice a day 180 tablet 1  . metFORMIN  (GLUCOPHAGE) 1000 MG tablet Take 1 tablet (1,000 mg total) by mouth 2 (two) times daily. 180 tablet 1  . methotrexate (RHEUMATREX) 2.5 MG tablet Take 15 mg by mouth once a week. Patient takes 3 tablets twice once a week to add up to be 6 tablets once weekly, starts with 3 tablets in the evening and takes 3 more tablets in the morning    . NONFORMULARY OR COMPOUNDED ITEM [facility] compound:  Peripheral Neuropathy cream - Bu[ovacaome 1%, Doxepin 3%, Gabapentin 6%, Pentoxifylline 3%, Topiramate 1%, dispense 120grams, apply 1-2 grams to affected area 3-4 times daily, + 3 refills. 1120 each 3  . pravastatin (PRAVACHOL) 80 MG tablet Take 1 tablet by mouth  daily 90 tablet 1  . spironolactone (ALDACTONE) 100 MG tablet Take 1 tablet by mouth  daily 90 tablet 1  . tapentadol (NUCYNTA) 50 MG TABS tablet Take 50 mg by mouth as needed for severe pain (spasms).     . vitamin B-12 (CYANOCOBALAMIN) 1000 MCG tablet Take 1,000 mcg by mouth daily.     No current facility-administered medications on file prior to visit.    Allergies  Allergen Reactions  . Acetaminophen Nausea Only  . Actos [Pioglitazone Hydrochloride]     edema  . Aspirin Nausea Only  . Atorvastatin Other (See Comments)    Felt drunk/high floating  . Morphine  Sulfate Other (See Comments)    Burns injecting "arm is on fire"  . Naproxen Sodium Nausea Only  . Sitagliptin Phosphate Nausea And Vomiting    Recent Results (from the past 2160 hour(s))  Hemoglobin A1c     Status: None   Collection Time: 05/23/16  9:24 AM  Result Value Ref Range   Hgb A1c MFr Bld 5.8 4.6 - 6.5 %    Comment: Glycemic Control Guidelines for People with Diabetes:Non Diabetic:  <6%Goal of Therapy: <7%Additional Action Suggested:  >8%   Comprehensive metabolic panel     Status: Abnormal   Collection Time: 05/23/16  9:24 AM  Result Value Ref Range   Sodium 137 135 - 145 mEq/L   Potassium 3.9 3.5 - 5.1 mEq/L   Chloride 103 96 - 112 mEq/L   CO2 23 19 - 32  mEq/L   Glucose, Bld 92 70 - 99 mg/dL   BUN 26 (H) 6 - 23 mg/dL   Creatinine, Ser 5.64 0.40 - 1.20 mg/dL   Total Bilirubin 0.6 0.2 - 1.2 mg/dL   Alkaline Phosphatase 71 39 - 117 U/L   AST 25 0 - 37 U/L   ALT 23 0 - 35 U/L   Total Protein 7.3 6.0 - 8.3 g/dL   Albumin 4.4 3.5 - 5.2 g/dL   Calcium 9.7 8.4 - 33.2 mg/dL   GFR 95.18 >84.16 mL/min    Objective: General: Patient is awake, alert, and oriented x 3 and in no acute distress.  Integument: Skin is warm, dry, and non-supple bilateral with trophic hyperpigmentation.  Nails are mildly elonagted and thickened 1-5 bilateral. No open lesions or preulcerative lesions present bilateral, + callus at medial hallux bilateral. Remaining integument unremarkable.  Vasculature:  Dorsalis Pedis pulse 2/4 bilateral. Posterior Tibial pulse  0/4 bilateral. No cyanosis. No gangrene. Capillary fill time <4 sec 1-5 bilateral. No hair growth to the level of the digits.Temperature gradient within normal limits. No varicosities present bilateral. Chronic skin changes bilateral.   Neurology: The patient has intact sensation measured with a 5.07/10g Semmes Weinstein Monofilament at all pedal sites bilateral . Vibratory sensation intact bilateral with tuning fork. No Babinski sign present bilateral. Subjective burning to toes.   Musculoskeletal: Mild lesser hammetoe gross pedal deformities noted bilateral. Muscular strength 5/5 in all lower extremity muscular groups bilateral without limitation on range of motion . No tenderness with calf compression bilateral.  Assessment and Plan: Problem List Items Addressed This Visit    None    Visit Diagnoses    Diabetic polyneuropathy associated with type 2 diabetes mellitus (HCC)    -  Primary   Toe pain, bilateral       Peripheral arterial disease (HCC)       Dermatophytosis of nail       Callus of foot         -Examined patient. -Discussed and educated patient on diabetic foot care, especially with  regards  to the vascular, neurological and musculoskeletal systems.  -Stressed the importance of good glycemic control and the detriment of not  controlling glucose levels in relation to the foot. 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  Asencion Islam, DPM

## 2016-07-13 DIAGNOSIS — K640 First degree hemorrhoids: Secondary | ICD-10-CM | POA: Diagnosis not present

## 2016-07-13 DIAGNOSIS — Z1211 Encounter for screening for malignant neoplasm of colon: Secondary | ICD-10-CM | POA: Diagnosis not present

## 2016-08-10 DIAGNOSIS — G5781 Other specified mononeuropathies of right lower limb: Secondary | ICD-10-CM | POA: Diagnosis not present

## 2016-08-10 DIAGNOSIS — M79671 Pain in right foot: Secondary | ICD-10-CM | POA: Diagnosis not present

## 2016-08-12 ENCOUNTER — Other Ambulatory Visit: Payer: Self-pay | Admitting: Endocrinology

## 2016-08-15 DIAGNOSIS — M2550 Pain in unspecified joint: Secondary | ICD-10-CM | POA: Diagnosis not present

## 2016-08-15 DIAGNOSIS — R5382 Chronic fatigue, unspecified: Secondary | ICD-10-CM | POA: Diagnosis not present

## 2016-08-15 DIAGNOSIS — M0589 Other rheumatoid arthritis with rheumatoid factor of multiple sites: Secondary | ICD-10-CM | POA: Diagnosis not present

## 2016-08-15 DIAGNOSIS — M150 Primary generalized (osteo)arthritis: Secondary | ICD-10-CM | POA: Diagnosis not present

## 2016-08-29 DIAGNOSIS — M79671 Pain in right foot: Secondary | ICD-10-CM | POA: Diagnosis not present

## 2016-09-05 DIAGNOSIS — G5791 Unspecified mononeuropathy of right lower limb: Secondary | ICD-10-CM | POA: Diagnosis not present

## 2016-09-05 DIAGNOSIS — M79671 Pain in right foot: Secondary | ICD-10-CM | POA: Diagnosis not present

## 2016-09-05 DIAGNOSIS — M545 Low back pain: Secondary | ICD-10-CM | POA: Diagnosis not present

## 2016-09-07 ENCOUNTER — Other Ambulatory Visit: Payer: Self-pay | Admitting: Endocrinology

## 2016-09-10 ENCOUNTER — Other Ambulatory Visit: Payer: Self-pay | Admitting: Endocrinology

## 2016-09-14 DIAGNOSIS — M545 Low back pain: Secondary | ICD-10-CM | POA: Diagnosis not present

## 2016-09-21 DIAGNOSIS — M79671 Pain in right foot: Secondary | ICD-10-CM | POA: Diagnosis not present

## 2016-09-21 DIAGNOSIS — G5791 Unspecified mononeuropathy of right lower limb: Secondary | ICD-10-CM | POA: Diagnosis not present

## 2016-09-21 DIAGNOSIS — M545 Low back pain: Secondary | ICD-10-CM | POA: Diagnosis not present

## 2016-09-22 ENCOUNTER — Other Ambulatory Visit (INDEPENDENT_AMBULATORY_CARE_PROVIDER_SITE_OTHER): Payer: PPO

## 2016-09-22 DIAGNOSIS — E119 Type 2 diabetes mellitus without complications: Secondary | ICD-10-CM | POA: Diagnosis not present

## 2016-09-22 LAB — LIPID PANEL
Cholesterol: 157 mg/dL (ref 0–200)
HDL: 72.2 mg/dL (ref >39.00)
LDL Cholesterol: 74 mg/dL (ref 0–99)
NonHDL: 84.64
Total CHOL/HDL Ratio: 2
Triglycerides: 54 mg/dL (ref 0.0–149.0)
VLDL: 10.8 mg/dL (ref 0.0–40.0)

## 2016-09-22 LAB — BASIC METABOLIC PANEL
BUN: 27 mg/dL — ABNORMAL HIGH (ref 6–23)
CO2: 28 mEq/L (ref 19–32)
Calcium: 10.1 mg/dL (ref 8.4–10.5)
Chloride: 100 mEq/L (ref 96–112)
Creatinine, Ser: 0.97 mg/dL (ref 0.40–1.20)
GFR: 72.33 mL/min (ref >60.00)
Glucose, Bld: 97 mg/dL (ref 70–99)
Potassium: 4 mEq/L (ref 3.5–5.1)
Sodium: 137 mEq/L (ref 135–145)

## 2016-09-22 LAB — HEMOGLOBIN A1C: Hgb A1c MFr Bld: 5.7 % (ref 4.6–6.5)

## 2016-09-26 ENCOUNTER — Ambulatory Visit (INDEPENDENT_AMBULATORY_CARE_PROVIDER_SITE_OTHER): Payer: PPO | Admitting: Endocrinology

## 2016-09-26 VITALS — BP 118/70 | HR 77 | Temp 98.1°F | Resp 14 | Ht 64.0 in | Wt 160.6 lb

## 2016-09-26 DIAGNOSIS — E119 Type 2 diabetes mellitus without complications: Secondary | ICD-10-CM | POA: Diagnosis not present

## 2016-09-26 DIAGNOSIS — E876 Hypokalemia: Secondary | ICD-10-CM

## 2016-09-26 NOTE — Progress Notes (Signed)
Patient ID: April [name], female   DOB: Nov 26, 1941, 75 y.o.   MRN: [id number]   Reason for Appointment: follow-up of various problems  History of Present Illness    HYPERTENSION:  diagnosed around 1979 with symptoms of headaches Previously she was taking Avapro, clonidine 0.6 at bedtime and Dyazide but blood pressure was relatively high with this regimen For evaluation of her hyperaldosteronism she was switched to labetalol 100 mg twice a day, Tenex 2 mg and doxazosin With this regimen her blood pressure has been much better She was subsequently started on Aldactone to help with her severe hypokalemia  Her treatment regimen has been stable with 100 mg Aldactone, 12.5 mg hydrochlorothiazide, doxazosin 2 mg and labetalol 100 mg twice a day  She has checked her blood pressure at the drug store and it is consistently normal    She does not complain of lightheadedness on standing up She is compliant with her medications Office blood pressure has been well controlled  HYPOKALEMIA:  This previously had been a chronic problem and associated with hypertension  Previously had  been prescribed 6 tablets of potassium daily She was off her diuretics and Avapro when her evaluation for hyperaldosteronism was done, aldosterone level was 6.0 along with a relatively low renin level  She has a normal potassium level consistently with Aldactone 100 mg daily Continues  on only one potassium tablet daily   with adequate control  Lab Results  Component Value Date   CREATININE 0.97 09/22/2016   BUN 27 (H) 09/22/2016   NA 137 09/22/2016   K 4.0 09/22/2016   CL 100 09/22/2016   CO2 28 09/22/2016    DIABETES type II:  This has been  well controlled with a regimen of metformin 1 g twice a day only She is generally fairly good with diet and walking regimen Although recently has had difficulty with foot pain limiting her walking Usually able to keep her weight same  A1c has been  consistently upper normal, now 5.7  Home blood sugar range  80-146 although checking mostly in the evenings after supper   Wt Readings from Last 3 Encounters:  09/26/16 160 lb 9.6 oz (72.8 kg)  05/26/16 159 lb (72.1 kg)  01/26/16 157 lb (71.2 kg)      Lab Results  Component Value Date   HGBA1C 5.7 09/22/2016   HGBA1C 5.8 05/23/2016   HGBA1C 6.0 01/21/2016   Lab Results  Component Value Date   MICROALBUR 2.7 (H) 01/21/2016   LDLCALC 74 09/22/2016   CREATININE 0.97 09/22/2016    OTHER active problems discussed today are in review of systems  LABS:  Lab on 09/22/2016  Component Date Value Ref Range Status  . Hgb A1c MFr Bld 09/22/2016 5.7  4.6 - 6.5 % Final  . Cholesterol 09/22/2016 157  0 - 200 mg/dL Final  . Triglycerides 09/22/2016 54.0  0.0 - 149.0 mg/dL Final  . HDL 40/98/119110/19/2017 72.20  >39.00 mg/dL Final  . VLDL 47/82/956210/19/2017 10.8  0.0 - 40.0 mg/dL Final  . LDL Cholesterol 09/22/2016 74  0 - 99 mg/dL Final  . Total CHOL/HDL Ratio 09/22/2016 2   Final  . NonHDL 09/22/2016 84.64   Final  . Sodium 09/22/2016 137  135 - 145 mEq/L Final  . Potassium 09/22/2016 4.0  3.5 - 5.1 mEq/L Final  . Chloride 09/22/2016 100  96 - 112 mEq/L Final  . CO2 09/22/2016 28  19 - 32 mEq/L Final  . Glucose,  Bld 09/22/2016 97  70 - 99 mg/dL Final  . BUN 14/48/1856 27* 6 - 23 mg/dL Final  . Creatinine, Ser 09/22/2016 0.97  0.40 - 1.20 mg/dL Final  . Calcium 31/49/7026 10.1  8.4 - 10.5 mg/dL Final  . GFR 37/85/8850 72.33  >60.00 mL/min Final      Medication List       Accurate as of 09/26/16  9:41 AM. Always use your most recent med list.          ACCU-CHEK COMPACT PLUS test strip Generic drug:  glucose blood Use to check blood sugar once a day dx code E11.9   ACCU-CHEK SOFTCLIX LANCETS lancets Use to check blood sugar once a day dx code E11.9   doxazosin 2 MG tablet Commonly known as:  CARDURA Take 1 tablet by mouth at  bedtime   ezetimibe 10 MG tablet Commonly known as:   ZETIA Take 10 mg by mouth daily.   folic acid 0.5 MG tablet Commonly known as:  FOLVITE Take 0.5 mg by mouth daily.   gabapentin 300 MG capsule Commonly known as:  NEURONTIN   Ginger 500 MG Caps Take by mouth.   hydrochlorothiazide 12.5 MG capsule Commonly known as:  MICROZIDE TAKE ONE CAPSULE BY MOUTH ONCE DAILY   IRON PO Take 1 tablet by mouth daily.   KLOR-CON M20 20 MEQ tablet Generic drug:  potassium chloride SA TAKE 1 TABLET BY MOUTH DAILY.   labetalol 100 MG tablet Commonly known as:  NORMODYNE Take 1 tablet by mouth  twice a day   metFORMIN 1000 MG tablet Commonly known as:  GLUCOPHAGE Take 1 tablet (1,000 mg total) by mouth 2 (two) times daily.   methotrexate 2.5 MG tablet Commonly known as:  RHEUMATREX Take 15 mg by mouth once a week. Patient takes 3 tablets twice once a week to add up to be 6 tablets once weekly, starts with 3 tablets in the evening and takes 3 more tablets in the morning   NONFORMULARY OR COMPOUNDED ITEM [facility] compound:  Peripheral Neuropathy cream - Bu[ovacaome 1%, Doxepin 3%, Gabapentin 6%, Pentoxifylline 3%, Topiramate 1%, dispense 120grams, apply 1-2 grams to affected area 3-4 times daily, + 3 refills.   NUCYNTA 50 MG tablet Generic drug:  tapentadol Take 50 mg by mouth as needed for severe pain (spasms).   pravastatin 80 MG tablet Commonly known as:  PRAVACHOL Take 1 tablet by mouth  daily   spironolactone 100 MG tablet Commonly known as:  ALDACTONE Take 1 tablet by mouth  daily   Turmeric 450 MG Caps Take by mouth.   vitamin B-12 1000 MCG tablet Commonly known as:  CYANOCOBALAMIN Take 1,000 mcg by mouth daily.       Allergies:  Allergies  Allergen Reactions  . Acetaminophen Nausea Only  . Actos [Pioglitazone Hydrochloride]     edema  . Aspirin Nausea Only  . Atorvastatin Other (See Comments)    Felt drunk/high floating  . Morphine Sulfate Other (See Comments)    Burns injecting "arm is on fire"  .  Naproxen Sodium Nausea Only  . Sitagliptin Phosphate Nausea And Vomiting    Past Medical History:  Diagnosis Date  . Allergic rhinitis   . Diabetes mellitus    TYPE 2  . Dyslipidemia   . Hyperkalemia   . Hypertension   . Insomnia   . RA (rheumatoid arthritis) (HCC)     Past Surgical History:  Procedure Laterality Date  . HAND TENDON SURGERY  Right    Family History  Problem Relation Age of Onset  . Diabetes Mother   . Diabetes Father   . Diabetes Sister     Social History:  reports that she has never smoked. She does not have any smokeless tobacco history on file. She reports that she does not drink alcohol or use drugs.  Review of Systems:  She has had problems with  her legs and feet, less in toes.  Has seen orthopedic surgeon also    History of rheumatoid arthritis on  Methotrexate   Vitamin D deficiency: Currently taking vitamin D3, 1000 U daily  LIPIDS: Taking Zetia from PCP and also pravastatin 80 mg Has excellent control.  Lab Results  Component Value Date   CHOL 157 09/22/2016   HDL 72.20 09/22/2016   LDLCALC 74 09/22/2016   TRIG 54.0 09/22/2016   CHOLHDL 2 09/22/2016       Examination:   BP 118/70   Pulse 77   Temp 98.1 F (36.7 C)   Resp 14   Ht 5\' 4"  (1.626 m)   Wt 160 lb 9.6 oz (72.8 kg)   SpO2 98%   BMI 27.57 kg/m   Body mass index is 27.57 kg/m.       Assesment/PLAN:  Hypokalemia: She has a consistently normal potassium level with 100 mg Aldactone and using only 1 tablet of potassium  She does not have hyperaldosteronism, likely has renal potassium wasting She will continue  100 mg of Aldactone Since potassium is still 3.9 she can continue taking 20 meq potassium daily  Hypertension: She is on a 3 drug regimen of  Aldactone, labetalol and doxazosin and blood pressure is well controlled with this, especially with continuing Aldactone   No orthostatic symptoms or change in blood pressure Since she has no microalbuminuria  will hold off on any ACE inhibitor or an ARB  DIABETES: Well controlled and A1c is excellent at 5.7 now She is on metformin 2 g daily alone with adequate control She is doing very well with diet  She tries to keep up with exercise regimen when she can  Blood sugars are excellent at home and she is checking readings after supper mostly Lab fasting reading glucose is normal  Leg and foot pain:?  Neuropathy or radiculopathy.  She will discuss with her other physicians and podiatrist    follow-up in 4 months     Ikey Omary 09/26/2016, 9:41 AM

## 2016-09-27 ENCOUNTER — Encounter: Payer: Self-pay | Admitting: Cardiovascular Disease

## 2016-10-07 ENCOUNTER — Ambulatory Visit (INDEPENDENT_AMBULATORY_CARE_PROVIDER_SITE_OTHER): Payer: PPO | Admitting: Cardiovascular Disease

## 2016-10-07 ENCOUNTER — Encounter: Payer: Self-pay | Admitting: Cardiovascular Disease

## 2016-10-07 VITALS — BP 126/78 | HR 59 | Ht 64.0 in | Wt 164.4 lb

## 2016-10-07 DIAGNOSIS — I340 Nonrheumatic mitral (valve) insufficiency: Secondary | ICD-10-CM

## 2016-10-07 DIAGNOSIS — I10 Essential (primary) hypertension: Secondary | ICD-10-CM

## 2016-10-07 DIAGNOSIS — R011 Cardiac murmur, unspecified: Secondary | ICD-10-CM

## 2016-10-07 NOTE — Patient Instructions (Signed)
Medication Instructions:  Your physician recommends that you continue on your current medications as directed. Please refer to the Current Medication list given to you today.   Labwork: None Ordered   Testing/Procedures: Your physician has requested that you have an echocardiogram. Echocardiography is a painless test that uses sound waves to create images of your heart. It provides your doctor with information about the size and shape of your heart and how well your heart's chambers and valves are working. This procedure takes approximately one hour. There are no restrictions for this procedure.   Follow-Up: Your physician wants you to follow-up in: 1 year with Dr. Nahser. You will receive a reminder letter in the mail two months in advance. If you don't receive a letter, please call our office to schedule the follow-up appointment.   If you need a refill on your cardiac medications before your next appointment, please call your pharmacy.   Thank you for choosing [facility]! Kolbie Lepkowski, RN [phone]    
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
## 2016-10-11 ENCOUNTER — Ambulatory Visit: Payer: PPO | Admitting: Sports Medicine

## 2016-10-31 ENCOUNTER — Ambulatory Visit (HOSPITAL_COMMUNITY): Payer: PPO

## 2016-11-01 ENCOUNTER — Other Ambulatory Visit: Payer: Self-pay

## 2016-11-01 ENCOUNTER — Ambulatory Visit (HOSPITAL_COMMUNITY): Payer: PPO | Attending: Internal Medicine

## 2016-11-01 ENCOUNTER — Other Ambulatory Visit: Payer: Self-pay | Admitting: Endocrinology

## 2016-11-01 DIAGNOSIS — I071 Rheumatic tricuspid insufficiency: Secondary | ICD-10-CM | POA: Diagnosis not present

## 2016-11-01 DIAGNOSIS — R011 Cardiac murmur, unspecified: Secondary | ICD-10-CM | POA: Insufficient documentation

## 2016-11-01 DIAGNOSIS — I350 Nonrheumatic aortic (valve) stenosis: Secondary | ICD-10-CM | POA: Diagnosis not present

## 2016-11-01 DIAGNOSIS — E119 Type 2 diabetes mellitus without complications: Secondary | ICD-10-CM | POA: Insufficient documentation

## 2016-11-01 DIAGNOSIS — I119 Hypertensive heart disease without heart failure: Secondary | ICD-10-CM | POA: Insufficient documentation

## 2016-11-01 DIAGNOSIS — E785 Hyperlipidemia, unspecified: Secondary | ICD-10-CM | POA: Diagnosis not present

## 2016-11-01 DIAGNOSIS — I340 Nonrheumatic mitral (valve) insufficiency: Secondary | ICD-10-CM | POA: Diagnosis not present

## 2016-11-03 DIAGNOSIS — R05 Cough: Secondary | ICD-10-CM | POA: Diagnosis not present

## 2016-11-03 DIAGNOSIS — R0789 Other chest pain: Secondary | ICD-10-CM | POA: Diagnosis not present

## 2016-11-07 ENCOUNTER — Telehealth: Payer: Self-pay | Admitting: Cardiovascular Disease
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
## 2016-11-07 NOTE — Telephone Encounter (Signed)
F/u Message ° °Pt returning RN call. Please call back to discuss  °

## 2016-12-21 ENCOUNTER — Other Ambulatory Visit: Payer: Self-pay | Admitting: Endocrinology

## 2017-01-02 DIAGNOSIS — H25012 Cortical age-related cataract, left eye: Secondary | ICD-10-CM | POA: Diagnosis not present

## 2017-01-02 DIAGNOSIS — H2512 Age-related nuclear cataract, left eye: Secondary | ICD-10-CM | POA: Diagnosis not present

## 2017-01-02 DIAGNOSIS — E119 Type 2 diabetes mellitus without complications: Secondary | ICD-10-CM | POA: Diagnosis not present

## 2017-01-02 LAB — HM DIABETES EYE EXAM

## 2017-01-06 ENCOUNTER — Other Ambulatory Visit: Payer: Self-pay | Admitting: Endocrinology

## 2017-01-12 DIAGNOSIS — R05 Cough: Secondary | ICD-10-CM | POA: Diagnosis not present

## 2017-01-12 DIAGNOSIS — J309 Allergic rhinitis, unspecified: Secondary | ICD-10-CM | POA: Diagnosis not present

## 2017-01-18 ENCOUNTER — Other Ambulatory Visit: Payer: Self-pay | Admitting: Endocrinology

## 2017-01-24 ENCOUNTER — Other Ambulatory Visit: Payer: PPO

## 2017-01-27 ENCOUNTER — Ambulatory Visit: Payer: PPO | Admitting: Endocrinology

## 2017-01-27 DIAGNOSIS — Z0289 Encounter for other administrative examinations: Secondary | ICD-10-CM

## 2017-02-15 ENCOUNTER — Other Ambulatory Visit: Payer: Self-pay | Admitting: Endocrinology

## 2017-02-15 ENCOUNTER — Other Ambulatory Visit (INDEPENDENT_AMBULATORY_CARE_PROVIDER_SITE_OTHER): Payer: PPO

## 2017-02-15 DIAGNOSIS — E119 Type 2 diabetes mellitus without complications: Secondary | ICD-10-CM

## 2017-02-15 LAB — COMPREHENSIVE METABOLIC PANEL
ALT: 19 U/L (ref 0–35)
AST: 19 U/L (ref 0–37)
Albumin: 4.7 g/dL (ref 3.5–5.2)
Alkaline Phosphatase: 72 U/L (ref 39–117)
BUN: 21 mg/dL (ref 6–23)
CO2: 24 mEq/L (ref 19–32)
Calcium: 10.3 mg/dL (ref 8.4–10.5)
Chloride: 103 mEq/L (ref 96–112)
Creatinine, Ser: 0.98 mg/dL (ref 0.40–1.20)
GFR: 71.4 mL/min (ref >60.00)
Glucose, Bld: 93 mg/dL (ref 70–99)
Potassium: 3.9 mEq/L (ref 3.5–5.1)
Sodium: 137 mEq/L (ref 135–145)
Total Bilirubin: 0.6 mg/dL (ref 0.2–1.2)
Total Protein: 7.7 g/dL (ref 6.0–8.3)

## 2017-02-15 LAB — HEMOGLOBIN A1C: Hgb A1c MFr Bld: 5.9 % (ref 4.6–6.5)

## 2017-02-15 LAB — MICROALBUMIN / CREATININE URINE RATIO
Creatinine,U: 127.4 mg/dL
Microalb Creat Ratio: 2 mg/g (ref 0.0–30.0)
Microalb, Ur: 2.6 mg/dL — ABNORMAL HIGH (ref 0.0–1.9)

## 2017-02-17 ENCOUNTER — Ambulatory Visit (INDEPENDENT_AMBULATORY_CARE_PROVIDER_SITE_OTHER): Payer: PPO | Admitting: Endocrinology

## 2017-02-17 ENCOUNTER — Encounter: Payer: Self-pay | Admitting: Endocrinology

## 2017-02-17 VITALS — BP 110/64 | HR 67 | Ht 64.0 in | Wt 158.0 lb

## 2017-02-17 DIAGNOSIS — I10 Essential (primary) hypertension: Secondary | ICD-10-CM

## 2017-02-17 DIAGNOSIS — E119 Type 2 diabetes mellitus without complications: Secondary | ICD-10-CM

## 2017-02-17 DIAGNOSIS — E089 Diabetes mellitus due to underlying condition without complications: Secondary | ICD-10-CM

## 2017-02-17 NOTE — Progress Notes (Signed)
Patient ID: April [name], female   DOB: 06/16/1941, 76 y.o.   MRN: [id number]   Reason for Appointment: follow-up of various problems  History of Present Illness    HYPERTENSION:  diagnosed around 1979 with symptoms of headaches Previously she was taking Avapro, clonidine 0.6 at bedtime and Dyazide but blood pressure was relatively high with this regimen For evaluation of her hyperaldosteronism she was switched to labetalol 100 mg twice a day, Tenex 2 mg and doxazosin With this regimen her blood pressure has been much better She was subsequently started on Aldactone to help with her severe hypokalemia  Her treatment regimen has been stable with 100 mg Aldactone, 12.5 mg hydrochlorothiazide, doxazosin 2 mg and labetalol 100 mg twice a day, this has controlled her blood pressure consistently  She has checked her blood pressure at the drug store and it is  about 115 systolic recently  She does not complain of lightheadedness on standing up  She is compliant with her medications as directed  HYPOKALEMIA:  This previously had been a chronic problem and associated with hypertension  Previously had  been prescribed 6 tablets of potassium daily She was off her diuretics and Avapro when her evaluation for hyperaldosteronism was done, aldosterone level was 6.0 along with a relatively low renin level  She has a normal potassium level consistently with Aldactone 100 mg daily along with 1 tablet of potassium    Lab Results  Component Value Date   CREATININE 0.98 02/15/2017   BUN 21 02/15/2017   NA 137 02/15/2017   K 3.9 02/15/2017   CL 103 02/15/2017   CO2 24 02/15/2017    DIABETES type II:  This has been  well controlled with a regimen of metformin 1 g twice a day only She is generally fairly good with diet and Has been able to maintain or lose a little weight She tries to keep up with her walking up to 30 minutes twice a day  A1c has been consistently upper  normal, now 5.9  Home blood sugar range  70-134 although checking mostly in the evenings after supper   Wt Readings from Last 3 Encounters:  02/17/17 158 lb (71.7 kg)  10/07/16 164 lb 6.4 oz (74.6 kg)  09/26/16 160 lb 9.6 oz (72.8 kg)      Lab Results  Component Value Date   HGBA1C 5.9 02/15/2017   HGBA1C 5.7 09/22/2016   HGBA1C 5.8 05/23/2016   Lab Results  Component Value Date   MICROALBUR 2.6 (H) 02/15/2017   LDLCALC 74 09/22/2016   CREATININE 0.98 02/15/2017    OTHER active problems discussed today are in review of systems  LABS:  Lab on 02/15/2017  Component Date Value Ref Range Status  . Hgb A1c MFr Bld 02/15/2017 5.9  4.6 - 6.5 % Final  . Sodium 02/15/2017 137  135 - 145 mEq/L Final  . Potassium 02/15/2017 3.9  3.5 - 5.1 mEq/L Final  . Chloride 02/15/2017 103  96 - 112 mEq/L Final  . CO2 02/15/2017 24  19 - 32 mEq/L Final  . Glucose, Bld 02/15/2017 93  70 - 99 mg/dL Final  . BUN 49/20/1007 21  6 - 23 mg/dL Final  . Creatinine, Ser 02/15/2017 0.98  0.40 - 1.20 mg/dL Final  . Total Bilirubin 02/15/2017 0.6  0.2 - 1.2 mg/dL Final  . Alkaline Phosphatase 02/15/2017 72  39 - 117 U/L Final  . AST 02/15/2017 19  0 - 37 U/L Final  .  ALT 02/15/2017 19  0 - 35 U/L Final  . Total Protein 02/15/2017 7.7  6.0 - 8.3 g/dL Final  . Albumin 07/62/2633 4.7  3.5 - 5.2 g/dL Final  . Calcium 35/45/6256 10.3  8.4 - 10.5 mg/dL Final  . GFR 38/93/7342 71.40  >60.00 mL/min Final  . Microalb, Ur 02/15/2017 2.6* 0.0 - 1.9 mg/dL Final  . Creatinine,U 87/68/1157 127.4  mg/dL Final  . Microalb Creat Ratio 02/15/2017 2.0  0.0 - 30.0 mg/g Final    Allergies as of 02/17/2017      Reactions   Acetaminophen Nausea Only   Actos [pioglitazone Hydrochloride]    edema   Aspirin Nausea Only   Atorvastatin Other (See Comments)   Felt drunk/high floating   Morphine Sulfate Other (See Comments)   Burns injecting "arm is on fire"   Naproxen Sodium Nausea Only   Sitagliptin Phosphate Nausea  And Vomiting      Medication List       Accurate as of 02/17/17  8:41 AM. Always use your most recent med list.          ACCU-CHEK COMPACT PLUS test strip Generic drug:  glucose blood Use to check blood sugar once a day dx code E11.9   ACCU-CHEK SOFTCLIX LANCETS lancets Use to check blood sugar once a day dx code E11.9   amitriptyline 25 MG tablet Commonly known as:  ELAVIL Take 25 mg by mouth at bedtime.   doxazosin 2 MG tablet Commonly known as:  CARDURA TAKE 1 TABLET BY MOUTH AT BEDTIME   ezetimibe 10 MG tablet Commonly known as:  ZETIA Take 10 mg by mouth daily.   folic acid 0.5 MG tablet Commonly known as:  FOLVITE Take 0.5 mg by mouth daily.   gabapentin 300 MG capsule Commonly known as:  NEURONTIN   Ginger 500 MG Caps Take by mouth.   hydrochlorothiazide 12.5 MG capsule Commonly known as:  MICROZIDE TAKE ONE CAPSULE BY MOUTH ONCE DAILY   IRON PO Take 1 tablet by mouth daily.   KLOR-CON M20 20 MEQ tablet Generic drug:  potassium chloride SA TAKE 1 TABLET BY MOUTH DAILY.   labetalol 100 MG tablet Commonly known as:  NORMODYNE TAKE 1 TABLET (100 MG TOTAL) BY MOUTH 2 (TWO) TIMES DAILY.   metFORMIN 1000 MG tablet Commonly known as:  GLUCOPHAGE TAKE 1 TABLET (1,000 MG TOTAL) BY MOUTH 2 (TWO) TIMES DAILY.   methotrexate 2.5 MG tablet Commonly known as:  RHEUMATREX Take 15 mg by mouth once a week. Patient takes 3 tablets twice once a week to add up to be 6 tablets once weekly, starts with 3 tablets in the evening and takes 3 more tablets in the morning   NONFORMULARY OR COMPOUNDED ITEM [facility] compound:  Peripheral Neuropathy cream - Bu[ovacaome 1%, Doxepin 3%, Gabapentin 6%, Pentoxifylline 3%, Topiramate 1%, dispense 120grams, apply 1-2 grams to affected area 3-4 times daily, + 3 refills.   NUCYNTA 50 MG tablet Generic drug:  tapentadol Take 50 mg by mouth as needed for severe pain (spasms).   pravastatin 80 MG tablet Commonly known as:   PRAVACHOL TAKE 1 TABLET BY MOUTH DAILY   spironolactone 100 MG tablet Commonly known as:  ALDACTONE Take 1 tablet by mouth  daily   spironolactone 100 MG tablet Commonly known as:  ALDACTONE TAKE 1 TABLET (100 MG TOTAL) BY MOUTH DAILY.   Turmeric 450 MG Caps Take by mouth.   vitamin B-12 1000 MCG tablet Commonly known as:  CYANOCOBALAMIN Take 1,000 mcg by mouth daily.       Allergies:  Allergies  Allergen Reactions  . Acetaminophen Nausea Only  . Actos [Pioglitazone Hydrochloride]     edema  . Aspirin Nausea Only  . Atorvastatin Other (See Comments)    Felt drunk/high floating  . Morphine Sulfate Other (See Comments)    Burns injecting "arm is on fire"  . Naproxen Sodium Nausea Only  . Sitagliptin Phosphate Nausea And Vomiting    Past Medical History:  Diagnosis Date  . Allergic rhinitis   . Diabetes mellitus    TYPE 2  . Dyslipidemia   . Hyperkalemia   . Hypertension   . Insomnia   . RA (rheumatoid arthritis) (HCC)     Past Surgical History:  Procedure Laterality Date  . HAND TENDON SURGERY     Right    Family History  Problem Relation Age of Onset  . Diabetes Mother   . Diabetes Father   . Diabetes Sister     Social History:  reports that she has never smoked. She has never used smokeless tobacco. She reports that she does not drink alcohol or use drugs.  Review of Systems:  Continues to have some problem with the left lower leg pain.  Has seen orthopedic surgeon also   History of rheumatoid arthritis on  Methotrexate   Vitamin D deficiency: Currently taking vitamin D3, 1000 U daily  LIPIDS: Taking Zetia from PCP and  pravastatin 80 mg Has excellent control.  Lab Results  Component Value Date   CHOL 157 09/22/2016   HDL 72.20 09/22/2016   LDLCALC 74 09/22/2016   TRIG 54.0 09/22/2016   CHOLHDL 2 09/22/2016       Examination:   BP 110/64   Pulse 67   Ht 5\' 4"  (1.626 m)   Wt 158 lb (71.7 kg)   SpO2 94%   BMI 27.12 kg/m   Body  mass index is 27.12 kg/m.       Assesment/PLAN:  Hypokalemia: She has a consistently normal potassium level with 100 mg Aldactone and Supplementing with only 1 tablet of potassium  She does not have hyperaldosteronism, likely has renal potassium wasting She will continue the same regimen  Hypertension: She is on a 3 drug regimen of  Aldactone, labetalol and doxazosin and blood pressure is low normal now No orthostatic symptoms and blood pressure outside the office is about 115 Since her blood pressure is low normal, considering her age she can cut back on her doxazosin to half a tablet  Since she has no microalbuminuria will hold off on any ACE inhibitor or an ARB  DIABETES: Well controlled and A1c is consistently in the upper normal range with metformin alone Blood sugars are fairly good at home, checking after meals She is on metformin 2 g daily alone with adequate control She is doing very well with diet  She tries to keep up with exercise regimen when she can Weight has improved    follow-up in 4 months   Patient Instructions  Doxazosin 1/2 daily    Spiro Ausborn 02/17/2017, 8:41 AM

## 2017-02-17 NOTE — Patient Instructions (Signed)
Doxazosin 1/2 daily

## 2017-02-28 ENCOUNTER — Other Ambulatory Visit: Payer: Self-pay | Admitting: Endocrinology

## 2017-03-17 ENCOUNTER — Other Ambulatory Visit: Payer: Self-pay | Admitting: Endocrinology

## 2017-03-19 ENCOUNTER — Other Ambulatory Visit: Payer: Self-pay | Admitting: Endocrinology

## 2017-04-07 ENCOUNTER — Telehealth: Payer: Self-pay | Admitting: Endocrinology

## 2017-04-07 NOTE — Telephone Encounter (Signed)
Patient is returning your call.  

## 2017-04-11 NOTE — Telephone Encounter (Signed)
Called patient and let them know that I do not see where our office called her and if she has any further questions to call us if she  Needs to.

## 2017-05-03 ENCOUNTER — Other Ambulatory Visit: Payer: Self-pay | Admitting: Endocrinology

## 2017-05-12 ENCOUNTER — Other Ambulatory Visit: Payer: Self-pay | Admitting: Endocrinology

## 2017-05-15 ENCOUNTER — Telehealth: Payer: Self-pay | Admitting: Endocrinology

## 2017-05-24 DIAGNOSIS — G5781 Other specified mononeuropathies of right lower limb: Secondary | ICD-10-CM | POA: Diagnosis not present

## 2017-05-24 DIAGNOSIS — M545 Low back pain: Secondary | ICD-10-CM | POA: Diagnosis not present

## 2017-05-24 DIAGNOSIS — G5782 Other specified mononeuropathies of left lower limb: Secondary | ICD-10-CM | POA: Diagnosis not present

## 2017-06-01 NOTE — Telephone Encounter (Signed)
erro  neous encounter

## 2017-06-02 DIAGNOSIS — Z6829 Body mass index (BMI) 29.0-29.9, adult: Secondary | ICD-10-CM | POA: Diagnosis not present

## 2017-06-02 DIAGNOSIS — E7800 Pure hypercholesterolemia, unspecified: Secondary | ICD-10-CM | POA: Diagnosis not present

## 2017-06-02 DIAGNOSIS — M79604 Pain in right leg: Secondary | ICD-10-CM | POA: Diagnosis not present

## 2017-06-02 DIAGNOSIS — E663 Overweight: Secondary | ICD-10-CM | POA: Diagnosis not present

## 2017-06-02 DIAGNOSIS — M79605 Pain in left leg: Secondary | ICD-10-CM | POA: Diagnosis not present

## 2017-06-02 DIAGNOSIS — E1140 Type 2 diabetes mellitus with diabetic neuropathy, unspecified: Secondary | ICD-10-CM | POA: Diagnosis not present

## 2017-06-02 DIAGNOSIS — M069 Rheumatoid arthritis, unspecified: Secondary | ICD-10-CM | POA: Diagnosis not present

## 2017-06-02 DIAGNOSIS — Z0000 Encounter for general adult medical examination without abnormal findings: Secondary | ICD-10-CM | POA: Diagnosis not present

## 2017-06-02 DIAGNOSIS — Z7984 Long term (current) use of oral hypoglycemic drugs: Secondary | ICD-10-CM | POA: Diagnosis not present

## 2017-06-05 DIAGNOSIS — M150 Primary generalized (osteo)arthritis: Secondary | ICD-10-CM | POA: Diagnosis not present

## 2017-06-05 DIAGNOSIS — M0589 Other rheumatoid arthritis with rheumatoid factor of multiple sites: Secondary | ICD-10-CM | POA: Diagnosis not present

## 2017-06-05 DIAGNOSIS — M79643 Pain in unspecified hand: Secondary | ICD-10-CM | POA: Diagnosis not present

## 2017-06-05 DIAGNOSIS — M79606 Pain in leg, unspecified: Secondary | ICD-10-CM | POA: Diagnosis not present

## 2017-06-05 DIAGNOSIS — M19042 Primary osteoarthritis, left hand: Secondary | ICD-10-CM | POA: Diagnosis not present

## 2017-06-05 DIAGNOSIS — R5382 Chronic fatigue, unspecified: Secondary | ICD-10-CM | POA: Diagnosis not present

## 2017-06-05 DIAGNOSIS — Z79899 Other long term (current) drug therapy: Secondary | ICD-10-CM | POA: Diagnosis not present

## 2017-06-05 DIAGNOSIS — M2550 Pain in unspecified joint: Secondary | ICD-10-CM | POA: Diagnosis not present

## 2017-06-05 DIAGNOSIS — M19041 Primary osteoarthritis, right hand: Secondary | ICD-10-CM | POA: Diagnosis not present

## 2017-06-05 DIAGNOSIS — M549 Dorsalgia, unspecified: Secondary | ICD-10-CM | POA: Diagnosis not present

## 2017-06-14 DIAGNOSIS — M47816 Spondylosis without myelopathy or radiculopathy, lumbar region: Secondary | ICD-10-CM | POA: Diagnosis not present

## 2017-06-14 DIAGNOSIS — M5136 Other intervertebral disc degeneration, lumbar region: Secondary | ICD-10-CM | POA: Diagnosis not present

## 2017-06-14 DIAGNOSIS — G894 Chronic pain syndrome: Secondary | ICD-10-CM | POA: Diagnosis not present

## 2017-06-15 ENCOUNTER — Other Ambulatory Visit: Payer: Self-pay

## 2017-06-15 MED ORDER — DOXAZOSIN MESYLATE 2 MG PO TABS: 2.0000 mg | ORAL_TABLET | Freq: Every day | ORAL | 1 refills | Status: DC

## 2017-06-16 ENCOUNTER — Other Ambulatory Visit (INDEPENDENT_AMBULATORY_CARE_PROVIDER_SITE_OTHER): Payer: PPO

## 2017-06-16 DIAGNOSIS — E119 Type 2 diabetes mellitus without complications: Secondary | ICD-10-CM

## 2017-06-16 DIAGNOSIS — E089 Diabetes mellitus due to underlying condition without complications: Secondary | ICD-10-CM

## 2017-06-16 LAB — COMPREHENSIVE METABOLIC PANEL
ALT: 14 U/L (ref 0–35)
AST: 16 U/L (ref 0–37)
Albumin: 4.2 g/dL (ref 3.5–5.2)
Alkaline Phosphatase: 66 U/L (ref 39–117)
BUN: 27 mg/dL — ABNORMAL HIGH (ref 6–23)
CO2: 26 mEq/L (ref 19–32)
Calcium: 9.7 mg/dL (ref 8.4–10.5)
Chloride: 106 mEq/L (ref 96–112)
Creatinine, Ser: 1.08 mg/dL (ref 0.40–1.20)
GFR: 63.77 mL/min (ref >60.00)
Glucose, Bld: 93 mg/dL (ref 70–99)
Potassium: 3.8 mEq/L (ref 3.5–5.1)
Sodium: 140 mEq/L (ref 135–145)
Total Bilirubin: 0.5 mg/dL (ref 0.2–1.2)
Total Protein: 6.8 g/dL (ref 6.0–8.3)

## 2017-06-16 LAB — HEMOGLOBIN A1C: Hgb A1c MFr Bld: 5.8 % (ref 4.6–6.5)

## 2017-06-19 ENCOUNTER — Ambulatory Visit (INDEPENDENT_AMBULATORY_CARE_PROVIDER_SITE_OTHER): Payer: PPO | Admitting: Endocrinology

## 2017-06-19 ENCOUNTER — Encounter: Payer: Self-pay | Admitting: Endocrinology

## 2017-06-19 VITALS — BP 100/64 | HR 73 | Ht 64.0 in | Wt 167.4 lb

## 2017-06-19 DIAGNOSIS — E119 Type 2 diabetes mellitus without complications: Secondary | ICD-10-CM | POA: Diagnosis not present

## 2017-06-19 DIAGNOSIS — I10 Essential (primary) hypertension: Secondary | ICD-10-CM

## 2017-06-19 NOTE — Progress Notes (Addendum)
Patient ID: April [name], female   DOB: 05-29-1941, 76 y.o.   MRN: [id number]   Reason for Appointment: follow-up of various problems  History of Present Illness    HYPERTENSION:  diagnosed around 1979 with symptoms of headaches Previously she was taking Avapro, clonidine 0.6 at bedtime and Dyazide but blood pressure was relatively high with this regimen For evaluation of her hyperaldosteronism she was switched to labetalol 100 mg twice a day, Tenex 2 mg and doxazosin With this regimen her blood pressure has been much better She was subsequently started on Aldactone to help with her severe hypokalemia  Her treatment regimen has been stable with 100 mg Aldactone, 12.5 mg hydrochlorothiazide, doxazosin 1 mg and labetalol 100 mg twice a day, this has controlled her blood pressure consistently On her last visit doxazosin was cut in half  She has checked her blood pressure at the drug store or other places and it is about 112-120 systolic   She does not complain of lightheadedness on standing up  She is compliant with her medications as directed  HYPOKALEMIA:  This previously had been a chronic problem and associated with hypertension  Previously had  been prescribed 6 tablets of potassium daily She was off her diuretics and Avapro when her evaluation for hyperaldosteronism was done, aldosterone level was 6.0 along with a relatively low renin level  She has a normal potassium level consistently with Aldactone 100 mg daily along with 1 tablet of potassium 20 mEq daily    Lab Results  Component Value Date   CREATININE 1.08 06/16/2017   BUN 27 (H) 06/16/2017   NA 140 06/16/2017   K 3.8 06/16/2017   CL 106 06/16/2017   CO2 26 06/16/2017    DIABETES type II:  This has been  well controlled with a regimen of metformin 1 g twice a day only  She is generally fairly good with diet  However recently eating more fruits and sometimes at breakfast will have fruit  alone Her weight has been fluctuating and appears to be going up now She tries to keep up with her walking up to 30 minutes, occasionally has difficulty walking  A1c has been consistently upper normal, now 5.8  Home blood sugar range upto 143, Did not bring her monitor and is doing some readings at different times   Wt Readings from Last 3 Encounters:  06/19/17 167 lb 6.4 oz (75.9 kg)  02/17/17 158 lb (71.7 kg)  10/07/16 164 lb 6.4 oz (74.6 kg)      Lab Results  Component Value Date   HGBA1C 5.8 06/16/2017   HGBA1C 5.9 02/15/2017   HGBA1C 5.7 09/22/2016   Lab Results  Component Value Date   MICROALBUR 2.6 (H) 02/15/2017   LDLCALC 74 09/22/2016   CREATININE 1.08 06/16/2017    OTHER active problems discussed today are in review of systems  LABS:  Lab on 06/16/2017  Component Date Value Ref Range Status  . Hgb A1c MFr Bld 06/16/2017 5.8  4.6 - 6.5 % Final   Glycemic Control Guidelines for People with Diabetes:Non Diabetic:  <6%Goal of Therapy: <7%Additional Action Suggested:  >8%   . Sodium 06/16/2017 140  135 - 145 mEq/L Final  . Potassium 06/16/2017 3.8  3.5 - 5.1 mEq/L Final  . Chloride 06/16/2017 106  96 - 112 mEq/L Final  . CO2 06/16/2017 26  19 - 32 mEq/L Final  . Glucose, Bld 06/16/2017 93  70 - 99 mg/dL Final  .  BUN 06/16/2017 27* 6 - 23 mg/dL Final  . Creatinine, Ser 06/16/2017 1.08  0.40 - 1.20 mg/dL Final  . Total Bilirubin 06/16/2017 0.5  0.2 - 1.2 mg/dL Final  . Alkaline Phosphatase 06/16/2017 66  39 - 117 U/L Final  . AST 06/16/2017 16  0 - 37 U/L Final  . ALT 06/16/2017 14  0 - 35 U/L Final  . Total Protein 06/16/2017 6.8  6.0 - 8.3 g/dL Final  . Albumin 08/65/7846 4.2  3.5 - 5.2 g/dL Final  . Calcium 96/29/5284 9.7  8.4 - 10.5 mg/dL Final  . GFR 13/24/4010 63.77  >60.00 mL/min Final    Allergies as of 06/19/2017      Reactions   Acetaminophen Nausea Only   Actos [pioglitazone Hydrochloride]    edema   Aspirin Nausea Only   Atorvastatin Other  (See Comments)   Felt drunk/high floating   Morphine Sulfate Other (See Comments)   Burns injecting "arm is on fire"   Naproxen Sodium Nausea Only   Sitagliptin Phosphate Nausea And Vomiting      Medication List       Accurate as of 06/19/17  3:59 PM. Always use your most recent med list.          ACCU-CHEK COMPACT PLUS test strip Generic drug:  glucose blood Use to check blood sugar once a day dx code E11.9   ACCU-CHEK SOFTCLIX LANCETS lancets Use to check blood sugar once a day dx code E11.9   amitriptyline 25 MG tablet Commonly known as:  ELAVIL Take 25 mg by mouth at bedtime.   doxazosin 2 MG tablet Commonly known as:  CARDURA Take 1 tablet (2 mg total) by mouth at bedtime.   ezetimibe 10 MG tablet Commonly known as:  ZETIA Take 10 mg by mouth daily.   folic acid 0.5 MG tablet Commonly known as:  FOLVITE Take 0.5 mg by mouth daily.   gabapentin 300 MG capsule Commonly known as:  NEURONTIN   Ginger 500 MG Caps Take by mouth.   hydrochlorothiazide 12.5 MG capsule Commonly known as:  MICROZIDE TAKE ONE CAPSULE BY MOUTH ONCE DAILY   IRON PO Take 1 tablet by mouth daily.   KLOR-CON M20 20 MEQ tablet Generic drug:  potassium chloride SA TAKE 1 TABLET BY MOUTH DAILY.   labetalol 100 MG tablet Commonly known as:  NORMODYNE TAKE 1 TABLET (100 MG TOTAL) BY MOUTH 2 (TWO) TIMES DAILY.   metFORMIN 1000 MG tablet Commonly known as:  GLUCOPHAGE TAKE 1 TABLET (1,000 MG TOTAL) BY MOUTH 2 (TWO) TIMES DAILY.   methotrexate 2.5 MG tablet Commonly known as:  RHEUMATREX Take 15 mg by mouth once a week. Patient takes 3 tablets twice once a week to add up to be 6 tablets once weekly, starts with 3 tablets in the evening and takes 3 more tablets in the morning   NONFORMULARY OR COMPOUNDED ITEM [facility] compound:  Peripheral Neuropathy cream - Bu[ovacaome 1%, Doxepin 3%, Gabapentin 6%, Pentoxifylline 3%, Topiramate 1%, dispense 120grams, apply 1-2 grams to  affected area 3-4 times daily, + 3 refills.   NUCYNTA 50 MG tablet Generic drug:  tapentadol Take 50 mg by mouth as needed for severe pain (spasms).   pravastatin 80 MG tablet Commonly known as:  PRAVACHOL TAKE 1 TABLET BY MOUTH DAILY   spironolactone 100 MG tablet Commonly known as:  ALDACTONE TAKE 1 TABLET (100 MG TOTAL) BY MOUTH DAILY.   Turmeric 450 MG Caps Take by mouth.  vitamin B-12 1000 MCG tablet Commonly known as:  CYANOCOBALAMIN Take 1,000 mcg by mouth daily.       Allergies:  Allergies  Allergen Reactions  . Acetaminophen Nausea Only  . Actos [Pioglitazone Hydrochloride]     edema  . Aspirin Nausea Only  . Atorvastatin Other (See Comments)    Felt drunk/high floating  . Morphine Sulfate Other (See Comments)    Burns injecting "arm is on fire"  . Naproxen Sodium Nausea Only  . Sitagliptin Phosphate Nausea And Vomiting    Past Medical History:  Diagnosis Date  . Allergic rhinitis   . Diabetes mellitus    TYPE 2  . Dyslipidemia   . Hyperkalemia   . Hypertension   . Insomnia   . RA (rheumatoid arthritis) (HCC)     Past Surgical History:  Procedure Laterality Date  . HAND TENDON SURGERY     Right    Family History  Problem Relation Age of Onset  . Diabetes Mother   . Diabetes Father   . Diabetes Sister     Social History:  reports that she has never smoked. She has never used smokeless tobacco. She reports that she does not drink alcohol or use drugs.  Review of Systems:  Continues to have some problem with back pain  History of rheumatoid arthritis on  Methotrexate   Vitamin D deficiency: Currently taking vitamin D3, 1000 U daily  LIPIDS: Taking Zetia from PCP and  pravastatin 80 mg Has excellent control.  Lab Results  Component Value Date   CHOL 157 09/22/2016   HDL 72.20 09/22/2016   LDLCALC 74 09/22/2016   TRIG 54.0 09/22/2016   CHOLHDL 2 09/22/2016       Examination:   BP 100/64   Pulse 73   Ht 5\' 4"  (1.626 m)    Wt 167 lb 6.4 oz (75.9 kg)   SpO2 97%   BMI 28.73 kg/m   Body mass index is 28.73 kg/m.     Repeat blood pressure 104/60 standing No ankle edema  Assesment/PLAN:  Hypokalemia: She has a well controlled potassium level with 100 mg Aldactone and Supplementing with only 1 tablet of potassium  Likely has renal potassium wasting She will continue the same regimen  Hypertension: She is on a 3 drug regimen of  Aldactone, labetalol and doxazosin and blood pressure is low normal again No orthostatic symptoms however Since her blood pressure is low normal will stop her doxazosin  Since she has no microalbuminuria and diabetes is mild does not need to be on any ACE inhibitor or an ARB  DIABETES: Well controlled and A1c is consistently in the upper normal range with metformin alone Although she has gained weight her control is still fairly good She is trying to walk as much as possible Also usually has good diet at home except eating more fruits and not adding protein at breakfast    She will follow-up in 4 months   Patient Instructions  More protein in am  No Doxazosin    Nyzir Dubois 06/19/2017, 3:59 PM

## 2017-06-19 NOTE — Patient Instructions (Signed)
More protein in am  No Doxazosin

## 2017-06-20 DIAGNOSIS — Z79899 Other long term (current) drug therapy: Secondary | ICD-10-CM | POA: Diagnosis not present

## 2017-06-20 DIAGNOSIS — Z7984 Long term (current) use of oral hypoglycemic drugs: Secondary | ICD-10-CM | POA: Diagnosis not present

## 2017-06-20 DIAGNOSIS — D649 Anemia, unspecified: Secondary | ICD-10-CM | POA: Diagnosis not present

## 2017-07-03 DIAGNOSIS — D649 Anemia, unspecified: Secondary | ICD-10-CM | POA: Diagnosis not present

## 2017-07-03 DIAGNOSIS — R197 Diarrhea, unspecified: Secondary | ICD-10-CM | POA: Diagnosis not present

## 2017-07-04 DIAGNOSIS — R197 Diarrhea, unspecified: Secondary | ICD-10-CM | POA: Diagnosis not present

## 2017-07-05 DIAGNOSIS — D649 Anemia, unspecified: Secondary | ICD-10-CM | POA: Diagnosis not present

## 2017-07-07 DIAGNOSIS — M9902 Segmental and somatic dysfunction of thoracic region: Secondary | ICD-10-CM | POA: Diagnosis not present

## 2017-07-07 DIAGNOSIS — M9905 Segmental and somatic dysfunction of pelvic region: Secondary | ICD-10-CM | POA: Diagnosis not present

## 2017-07-07 DIAGNOSIS — S338XXA Sprain of other parts of lumbar spine and pelvis, initial encounter: Secondary | ICD-10-CM | POA: Diagnosis not present

## 2017-07-07 DIAGNOSIS — S29012A Strain of muscle and tendon of back wall of thorax, initial encounter: Secondary | ICD-10-CM | POA: Diagnosis not present

## 2017-07-07 DIAGNOSIS — M9903 Segmental and somatic dysfunction of lumbar region: Secondary | ICD-10-CM | POA: Diagnosis not present

## 2017-07-07 DIAGNOSIS — S39012A Strain of muscle, fascia and tendon of lower back, initial encounter: Secondary | ICD-10-CM | POA: Diagnosis not present

## 2017-07-10 DIAGNOSIS — M9905 Segmental and somatic dysfunction of pelvic region: Secondary | ICD-10-CM | POA: Diagnosis not present

## 2017-07-10 DIAGNOSIS — S338XXA Sprain of other parts of lumbar spine and pelvis, initial encounter: Secondary | ICD-10-CM | POA: Diagnosis not present

## 2017-07-10 DIAGNOSIS — S29012A Strain of muscle and tendon of back wall of thorax, initial encounter: Secondary | ICD-10-CM | POA: Diagnosis not present

## 2017-07-10 DIAGNOSIS — S39012A Strain of muscle, fascia and tendon of lower back, initial encounter: Secondary | ICD-10-CM | POA: Diagnosis not present

## 2017-07-10 DIAGNOSIS — M9902 Segmental and somatic dysfunction of thoracic region: Secondary | ICD-10-CM | POA: Diagnosis not present

## 2017-07-10 DIAGNOSIS — M9903 Segmental and somatic dysfunction of lumbar region: Secondary | ICD-10-CM | POA: Diagnosis not present

## 2017-07-12 DIAGNOSIS — S29012A Strain of muscle and tendon of back wall of thorax, initial encounter: Secondary | ICD-10-CM | POA: Diagnosis not present

## 2017-07-12 DIAGNOSIS — M9903 Segmental and somatic dysfunction of lumbar region: Secondary | ICD-10-CM | POA: Diagnosis not present

## 2017-07-12 DIAGNOSIS — S39012A Strain of muscle, fascia and tendon of lower back, initial encounter: Secondary | ICD-10-CM | POA: Diagnosis not present

## 2017-07-12 DIAGNOSIS — M9905 Segmental and somatic dysfunction of pelvic region: Secondary | ICD-10-CM | POA: Diagnosis not present

## 2017-07-12 DIAGNOSIS — S338XXA Sprain of other parts of lumbar spine and pelvis, initial encounter: Secondary | ICD-10-CM | POA: Diagnosis not present

## 2017-07-12 DIAGNOSIS — M9902 Segmental and somatic dysfunction of thoracic region: Secondary | ICD-10-CM | POA: Diagnosis not present

## 2017-07-17 DIAGNOSIS — M9903 Segmental and somatic dysfunction of lumbar region: Secondary | ICD-10-CM | POA: Diagnosis not present

## 2017-07-17 DIAGNOSIS — S338XXA Sprain of other parts of lumbar spine and pelvis, initial encounter: Secondary | ICD-10-CM | POA: Diagnosis not present

## 2017-07-17 DIAGNOSIS — S39012A Strain of muscle, fascia and tendon of lower back, initial encounter: Secondary | ICD-10-CM | POA: Diagnosis not present

## 2017-07-17 DIAGNOSIS — M9902 Segmental and somatic dysfunction of thoracic region: Secondary | ICD-10-CM | POA: Diagnosis not present

## 2017-07-17 DIAGNOSIS — S29012A Strain of muscle and tendon of back wall of thorax, initial encounter: Secondary | ICD-10-CM | POA: Diagnosis not present

## 2017-07-17 DIAGNOSIS — M9905 Segmental and somatic dysfunction of pelvic region: Secondary | ICD-10-CM | POA: Diagnosis not present

## 2017-07-18 DIAGNOSIS — Z1231 Encounter for screening mammogram for malignant neoplasm of breast: Secondary | ICD-10-CM | POA: Diagnosis not present

## 2017-07-19 DIAGNOSIS — S29012A Strain of muscle and tendon of back wall of thorax, initial encounter: Secondary | ICD-10-CM | POA: Diagnosis not present

## 2017-07-19 DIAGNOSIS — M9902 Segmental and somatic dysfunction of thoracic region: Secondary | ICD-10-CM | POA: Diagnosis not present

## 2017-07-19 DIAGNOSIS — M9903 Segmental and somatic dysfunction of lumbar region: Secondary | ICD-10-CM | POA: Diagnosis not present

## 2017-07-19 DIAGNOSIS — S39012A Strain of muscle, fascia and tendon of lower back, initial encounter: Secondary | ICD-10-CM | POA: Diagnosis not present

## 2017-07-19 DIAGNOSIS — S338XXA Sprain of other parts of lumbar spine and pelvis, initial encounter: Secondary | ICD-10-CM | POA: Diagnosis not present

## 2017-07-19 DIAGNOSIS — M9905 Segmental and somatic dysfunction of pelvic region: Secondary | ICD-10-CM | POA: Diagnosis not present

## 2017-07-21 DIAGNOSIS — M9905 Segmental and somatic dysfunction of pelvic region: Secondary | ICD-10-CM | POA: Diagnosis not present

## 2017-07-21 DIAGNOSIS — S29012A Strain of muscle and tendon of back wall of thorax, initial encounter: Secondary | ICD-10-CM | POA: Diagnosis not present

## 2017-07-21 DIAGNOSIS — M9903 Segmental and somatic dysfunction of lumbar region: Secondary | ICD-10-CM | POA: Diagnosis not present

## 2017-07-21 DIAGNOSIS — M9902 Segmental and somatic dysfunction of thoracic region: Secondary | ICD-10-CM | POA: Diagnosis not present

## 2017-07-21 DIAGNOSIS — S338XXA Sprain of other parts of lumbar spine and pelvis, initial encounter: Secondary | ICD-10-CM | POA: Diagnosis not present

## 2017-07-21 DIAGNOSIS — S39012A Strain of muscle, fascia and tendon of lower back, initial encounter: Secondary | ICD-10-CM | POA: Diagnosis not present

## 2017-07-24 DIAGNOSIS — S338XXA Sprain of other parts of lumbar spine and pelvis, initial encounter: Secondary | ICD-10-CM | POA: Diagnosis not present

## 2017-07-24 DIAGNOSIS — M9905 Segmental and somatic dysfunction of pelvic region: Secondary | ICD-10-CM | POA: Diagnosis not present

## 2017-07-24 DIAGNOSIS — M9903 Segmental and somatic dysfunction of lumbar region: Secondary | ICD-10-CM | POA: Diagnosis not present

## 2017-07-24 DIAGNOSIS — M9902 Segmental and somatic dysfunction of thoracic region: Secondary | ICD-10-CM | POA: Diagnosis not present

## 2017-07-24 DIAGNOSIS — S29012A Strain of muscle and tendon of back wall of thorax, initial encounter: Secondary | ICD-10-CM | POA: Diagnosis not present

## 2017-07-24 DIAGNOSIS — S39012A Strain of muscle, fascia and tendon of lower back, initial encounter: Secondary | ICD-10-CM | POA: Diagnosis not present

## 2017-07-25 ENCOUNTER — Other Ambulatory Visit: Payer: Self-pay | Admitting: Endocrinology

## 2017-07-26 DIAGNOSIS — M9902 Segmental and somatic dysfunction of thoracic region: Secondary | ICD-10-CM | POA: Diagnosis not present

## 2017-07-26 DIAGNOSIS — S29012A Strain of muscle and tendon of back wall of thorax, initial encounter: Secondary | ICD-10-CM | POA: Diagnosis not present

## 2017-07-26 DIAGNOSIS — M47816 Spondylosis without myelopathy or radiculopathy, lumbar region: Secondary | ICD-10-CM | POA: Diagnosis not present

## 2017-07-26 DIAGNOSIS — S39012A Strain of muscle, fascia and tendon of lower back, initial encounter: Secondary | ICD-10-CM | POA: Diagnosis not present

## 2017-07-26 DIAGNOSIS — M9903 Segmental and somatic dysfunction of lumbar region: Secondary | ICD-10-CM | POA: Diagnosis not present

## 2017-07-26 DIAGNOSIS — M9905 Segmental and somatic dysfunction of pelvic region: Secondary | ICD-10-CM | POA: Diagnosis not present

## 2017-07-26 DIAGNOSIS — G894 Chronic pain syndrome: Secondary | ICD-10-CM | POA: Diagnosis not present

## 2017-07-26 DIAGNOSIS — M5136 Other intervertebral disc degeneration, lumbar region: Secondary | ICD-10-CM | POA: Diagnosis not present

## 2017-07-26 DIAGNOSIS — S338XXA Sprain of other parts of lumbar spine and pelvis, initial encounter: Secondary | ICD-10-CM | POA: Diagnosis not present

## 2017-07-31 ENCOUNTER — Telehealth: Payer: Self-pay

## 2017-07-31 NOTE — Telephone Encounter (Signed)
Called patient cell number at [phone] and left a voice message to let her know that if she has the back injection and if her blood sugars go over 200 then we can call her in the Glipizide ER 5 mg to help her get through the steroid injection.

## 2017-07-31 NOTE — Telephone Encounter (Signed)
If her blood sugar goes over 200 she can be given a prescription for glipizide ER 5 mg daily, most likely will need this for less than 7 days.  She will call if needed

## 2017-07-31 NOTE — Telephone Encounter (Signed)
[name] came in the office today to let us know that she will be having a steroid injection in her back with Dr. Ethelene Hal at [facility] soon and is asking what she needs to do if her blood sugars become elevated? Please advise.

## 2017-08-02 DIAGNOSIS — D649 Anemia, unspecified: Secondary | ICD-10-CM | POA: Diagnosis not present

## 2017-08-16 ENCOUNTER — Other Ambulatory Visit: Payer: Self-pay | Admitting: Endocrinology

## 2017-09-01 ENCOUNTER — Encounter: Payer: Self-pay | Admitting: Hematology

## 2017-09-01 ENCOUNTER — Telehealth: Payer: Self-pay | Admitting: Hematology

## 2017-09-01 NOTE — Telephone Encounter (Signed)
Appt has been scheduled for the pt to see Dr. Candise Che on 10/8 at 11am. Letter mailed to the pt and faxed to the referring to notify the pt.

## 2017-09-05 DIAGNOSIS — M549 Dorsalgia, unspecified: Secondary | ICD-10-CM | POA: Diagnosis not present

## 2017-09-05 DIAGNOSIS — Z79899 Other long term (current) drug therapy: Secondary | ICD-10-CM | POA: Diagnosis not present

## 2017-09-05 DIAGNOSIS — M2550 Pain in unspecified joint: Secondary | ICD-10-CM | POA: Diagnosis not present

## 2017-09-05 DIAGNOSIS — M150 Primary generalized (osteo)arthritis: Secondary | ICD-10-CM | POA: Diagnosis not present

## 2017-09-05 DIAGNOSIS — R5382 Chronic fatigue, unspecified: Secondary | ICD-10-CM | POA: Diagnosis not present

## 2017-09-05 DIAGNOSIS — M0589 Other rheumatoid arthritis with rheumatoid factor of multiple sites: Secondary | ICD-10-CM | POA: Diagnosis not present

## 2017-09-05 DIAGNOSIS — M79606 Pain in leg, unspecified: Secondary | ICD-10-CM | POA: Diagnosis not present
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
## 2017-09-07 DIAGNOSIS — M545 Low back pain: Secondary | ICD-10-CM | POA: Diagnosis not present

## 2017-09-11 ENCOUNTER — Ambulatory Visit (HOSPITAL_BASED_OUTPATIENT_CLINIC_OR_DEPARTMENT_OTHER): Payer: PPO | Admitting: Hematology

## 2017-09-11 ENCOUNTER — Ambulatory Visit (HOSPITAL_BASED_OUTPATIENT_CLINIC_OR_DEPARTMENT_OTHER): Payer: PPO

## 2017-09-11 ENCOUNTER — Encounter: Payer: Self-pay | Admitting: Hematology

## 2017-09-11 VITALS — BP 135/77 | HR 84 | Temp 98.5°F | Resp 20 | Ht 64.0 in | Wt 163.7 lb

## 2017-09-11 DIAGNOSIS — D649 Anemia, unspecified: Secondary | ICD-10-CM

## 2017-09-11 LAB — COMPREHENSIVE METABOLIC PANEL
ALT: 23 U/L (ref 0–55)
AST: 24 U/L (ref 5–34)
Albumin: 4.4 g/dL (ref 3.5–5.0)
Alkaline Phosphatase: 92 U/L (ref 40–150)
Anion Gap: 9 mEq/L (ref 3–11)
BUN: 19 mg/dL (ref 7.0–26.0)
CO2: 25 mEq/L (ref 22–29)
Calcium: 10.3 mg/dL (ref 8.4–10.4)
Chloride: 105 mEq/L (ref 98–109)
Creatinine: 1.1 mg/dL (ref 0.6–1.1)
EGFR: 60 mL/min/{1.73_m2} — ABNORMAL LOW (ref >90)
Glucose: 94 mg/dl (ref 70–140)
Potassium: 3.7 mEq/L (ref 3.5–5.1)
Sodium: 139 mEq/L (ref 136–145)
Total Bilirubin: 0.48 mg/dL (ref 0.20–1.20)
Total Protein: 7.8 g/dL (ref 6.4–8.3)

## 2017-09-11 LAB — CHCC SMEAR

## 2017-09-11 LAB — CBC & DIFF AND RETIC
BASO%: 0.3 % (ref 0.0–2.0)
Basophils Absolute: 0 10*3/uL (ref 0.0–0.1)
EOS%: 2 % (ref 0.0–7.0)
Eosinophils Absolute: 0.1 10*3/uL (ref 0.0–0.5)
HCT: 33.7 % — ABNORMAL LOW (ref 34.8–46.6)
HGB: 10.8 g/dL — ABNORMAL LOW (ref 11.6–15.9)
Immature Retic Fract: 7.8 % (ref 1.60–10.00)
LYMPH%: 26.9 % (ref 14.0–49.7)
MCH: 29.7 pg (ref 25.1–34.0)
MCHC: 32 g/dL (ref 31.5–36.0)
MCV: 92.6 fL (ref 79.5–101.0)
MONO#: 0.8 10*3/uL (ref 0.1–0.9)
MONO%: 12.3 % (ref 0.0–14.0)
NEUT#: 3.7 10*3/uL (ref 1.5–6.5)
NEUT%: 58.5 % (ref 38.4–76.8)
Platelets: 306 10*3/uL (ref 145–400)
RBC: 3.64 10*6/uL — ABNORMAL LOW (ref 3.70–5.45)
RDW: 13.6 % (ref 11.2–14.5)
Retic %: 1.08 % (ref 0.70–2.10)
Retic Ct Abs: 39.31 10*3/uL (ref 33.70–90.70)
WBC: 6.4 10*3/uL (ref 3.9–10.3)
lymph#: 1.7 10*3/uL (ref 0.9–3.3)

## 2017-09-11 LAB — LACTATE DEHYDROGENASE: LDH: 233 U/L (ref 125–245)

## 2017-09-12 DIAGNOSIS — D631 Anemia in chronic kidney disease: Secondary | ICD-10-CM | POA: Diagnosis not present

## 2017-09-12 DIAGNOSIS — N183 Chronic kidney disease, stage 3 (moderate): Secondary | ICD-10-CM | POA: Diagnosis not present

## 2017-09-12 LAB — VITAMIN B12: Vitamin B12: >2000 pg/mL — ABNORMAL HIGH (ref 232–1245)

## 2017-09-12 LAB — KAPPA/LAMBDA LIGHT CHAINS
Ig Kappa Free Light Chain: 15.3 mg/L (ref 3.3–19.4)
Ig Lambda Free Light Chain: 11.1 mg/L (ref 5.7–26.3)
Kappa/Lambda FluidC Ratio: 1.38 (ref 0.26–1.65)

## 2017-09-12 LAB — HAPTOGLOBIN: Haptoglobin: 237 mg/dL — ABNORMAL HIGH (ref 34–200)

## 2017-09-12 LAB — ERYTHROPOIETIN: Erythropoietin: 15.2 m[IU]/mL (ref 2.6–18.5)

## 2017-09-12 LAB — SEDIMENTATION RATE: Sedimentation Rate-Westergren: 42 mm/hr — ABNORMAL HIGH (ref 0–40)

## 2017-09-14 LAB — MULTIPLE MYELOMA PANEL, SERUM
Albumin SerPl Elph-Mcnc: 3.9 g/dL (ref 2.9–4.4)
Albumin/Glob SerPl: 1.2 (ref 0.7–1.7)
Alpha 1: 0.2 g/dL (ref 0.0–0.4)
Alpha2 Glob SerPl Elph-Mcnc: 0.9 g/dL (ref 0.4–1.0)
B-Globulin SerPl Elph-Mcnc: 1.2 g/dL (ref 0.7–1.3)
Gamma Glob SerPl Elph-Mcnc: 1.1 g/dL (ref 0.4–1.8)
Globulin, Total: 3.4 g/dL (ref 2.2–3.9)
IgA, Qn, Serum: 199 mg/dL (ref 64–422)
IgG, Qn, Serum: 868 mg/dL (ref 700–1600)
IgM, Qn, Serum: 88 mg/dL (ref 26–217)
Total Protein: 7.3 g/dL (ref 6.0–8.5)

## 2017-09-21 DIAGNOSIS — M545 Low back pain: Secondary | ICD-10-CM | POA: Diagnosis not present

## 2017-09-25 ENCOUNTER — Other Ambulatory Visit: Payer: Self-pay | Admitting: Endocrinology

## 2017-10-17 ENCOUNTER — Other Ambulatory Visit (INDEPENDENT_AMBULATORY_CARE_PROVIDER_SITE_OTHER): Payer: PPO

## 2017-10-17 DIAGNOSIS — E119 Type 2 diabetes mellitus without complications: Secondary | ICD-10-CM | POA: Diagnosis not present

## 2017-10-17 LAB — LIPID PANEL
Cholesterol: 192 mg/dL (ref 0–200)
HDL: 67.6 mg/dL (ref >39.00)
LDL Cholesterol: 112 mg/dL — ABNORMAL HIGH (ref 0–99)
NonHDL: 124.75
Total CHOL/HDL Ratio: 3
Triglycerides: 63 mg/dL (ref 0.0–149.0)
VLDL: 12.6 mg/dL (ref 0.0–40.0)

## 2017-10-17 LAB — BASIC METABOLIC PANEL
BUN: 22 mg/dL (ref 6–23)
CO2: 27 mEq/L (ref 19–32)
Calcium: 9.8 mg/dL (ref 8.4–10.5)
Chloride: 104 mEq/L (ref 96–112)
Creatinine, Ser: 1.07 mg/dL (ref 0.40–1.20)
GFR: 64.4 mL/min (ref >60.00)
Glucose, Bld: 91 mg/dL (ref 70–99)
Potassium: 3.7 mEq/L (ref 3.5–5.1)
Sodium: 139 mEq/L (ref 135–145)

## 2017-10-17 LAB — HEMOGLOBIN A1C: Hgb A1c MFr Bld: 6 % (ref 4.6–6.5)

## 2017-10-20 ENCOUNTER — Ambulatory Visit (INDEPENDENT_AMBULATORY_CARE_PROVIDER_SITE_OTHER): Payer: PPO | Admitting: Endocrinology

## 2017-10-20 ENCOUNTER — Encounter: Payer: Self-pay | Admitting: Endocrinology

## 2017-10-20 VITALS — BP 132/70 | HR 86 | Ht 64.0 in | Wt 166.6 lb

## 2017-10-20 DIAGNOSIS — E876 Hypokalemia: Secondary | ICD-10-CM

## 2017-10-20 DIAGNOSIS — I10 Essential (primary) hypertension: Secondary | ICD-10-CM

## 2017-10-20 DIAGNOSIS — E782 Mixed hyperlipidemia: Secondary | ICD-10-CM

## 2017-10-20 DIAGNOSIS — E119 Type 2 diabetes mellitus without complications: Secondary | ICD-10-CM | POA: Diagnosis not present

## 2017-10-20 NOTE — Progress Notes (Signed)
Patient ID: April [name], female   DOB: [birth year], 76 y.o.   MRN: [id number]   Reason for Appointment: follow-up of various problems  History of Present Illness    HYPERTENSION:  diagnosed around 1979 with symptoms of headaches Previously she was taking Avapro, clonidine 0.6 at bedtime and Dyazide but blood pressure was relatively high with this regimen For evaluation of her hyperaldosteronism she was switched to labetalol 100 mg twice a day, Tenex 2 mg and doxazosin With this regimen her blood pressure has been much better She was subsequently started on Aldactone to help with her severe hypokalemia  Her treatment regimen has been stable with 100 mg Aldactone, 12.5 mg hydrochlorothiazide and labetalol 100 mg twice a day, this has controlled her blood pressure consistently On her last visit doxazosin was stopped because of low-normal blood pressure readings   She has checked her blood pressure at the drug store or other places and it is about 110-120/60-65 systolic   She does not complain of lightheadedness on standing up  She is compliant with her medications as prescribed  HYPOKALEMIA:  This previously had been a chronic problem and associated with hypertension  Previously had  been prescribed 6 tablets of potassium daily She was off her diuretics and Avapro when her evaluation for hyperaldosteronism was done, aldosterone level was 6.0 along with a relatively low renin level  She has a normal potassium level consistently with Aldactone 100 mg daily along with 1 tablet of potassium 20 mEq daily    Lab Results  Component Value Date   CREATININE 1.07 10/17/2017   BUN 22 10/17/2017   NA 139 10/17/2017   K 3.7 10/17/2017   CL 104 10/17/2017   CO2 27 10/17/2017    DIABETES type II:   This has been  well controlled with a regimen of metformin 1 g twice a day only  A1c has been consistently upper normal, now 6.0  She checks her blood sugars only  sporadically and mostly in the mornings or midday, usually range 10 5-130; has only 6 readings for the last month She had a reading of 52 in the morning but did not have any symptoms last month Also after steroid injection over a month ago she had a reading of 150 She is generally fairly good with diet, has been advised to cut back on amount of grapes and other sweet fruits   Her weight has been fluctuating    She tries to keep up with her walking up to 30 minutes, occasionally has difficulty walking    Wt Readings from Last 3 Encounters:  10/20/17 166 lb 9.6 oz (75.6 kg)  09/11/17 163 lb 11.2 oz (74.3 kg)  06/19/17 167 lb 6.4 oz (75.9 kg)      Lab Results  Component Value Date   HGBA1C 6.0 10/17/2017   HGBA1C 5.8 06/16/2017   HGBA1C 5.9 02/15/2017   Lab Results  Component Value Date   MICROALBUR 2.6 (H) 02/15/2017   LDLCALC 112 (H) 10/17/2017   CREATININE 1.07 10/17/2017    OTHER active problems discussed today are in review of systems  LABS:  Lab on 10/17/2017  Component Date Value Ref Range Status  . Cholesterol 10/17/2017 192  0 - 200 mg/dL Final   ATP III Classification       Desirable:  < 200 mg/dL               Borderline High:  200 - 239 mg/dL  High:  > = 240 mg/dL  . Triglycerides 10/17/2017 63.0  0.0 - 149.0 mg/dL Final   Normal:  <778 mg/dLBorderline High:  150 - 199 mg/dL  . HDL 10/17/2017 67.60  >39.00 mg/dL Final  . VLDL 24/23/5361 12.6  0.0 - 40.0 mg/dL Final  . LDL Cholesterol 10/17/2017 112* 0 - 99 mg/dL Final  . Total CHOL/HDL Ratio 10/17/2017 3   Final                  Men          Women1/2 Average Risk     3.4          3.3Average Risk          5.0          4.42X Average Risk          9.6          7.13X Average Risk          15.0          11.0                      . NonHDL 10/17/2017 124.75   Final   NOTE:  Non-HDL goal should be 30 mg/dL higher than patient's LDL goal (i.e. LDL goal of < 70 mg/dL, would have non-HDL goal of < 100 mg/dL)  .  Sodium 10/17/2017 139  135 - 145 mEq/L Final  . Potassium 10/17/2017 3.7  3.5 - 5.1 mEq/L Final  . Chloride 10/17/2017 104  96 - 112 mEq/L Final  . CO2 10/17/2017 27  19 - 32 mEq/L Final  . Glucose, Bld 10/17/2017 91  70 - 99 mg/dL Final  . BUN 44/31/5400 22  6 - 23 mg/dL Final  . Creatinine, Ser 10/17/2017 1.07  0.40 - 1.20 mg/dL Final  . Calcium 86/76/1950 9.8  8.4 - 10.5 mg/dL Final  . GFR 93/26/7124 64.40  >60.00 mL/min Final  . Hgb A1c MFr Bld 10/17/2017 6.0  4.6 - 6.5 % Final   Glycemic Control Guidelines for People with Diabetes:Non Diabetic:  <6%Goal of Therapy: <7%Additional Action Suggested:  >8%     Allergies as of 10/20/2017      Reactions   Acetaminophen Nausea Only   Actos [pioglitazone Hydrochloride]    edema   Aspirin Nausea Only   Atorvastatin Other (See Comments)   Felt drunk/high floating   Morphine Sulfate Other (See Comments)   Burns injecting "arm is on fire"   Naproxen Sodium Nausea Only   Sitagliptin Phosphate Nausea And Vomiting      Medication List        Accurate as of 10/20/17  8:17 AM. Always use your most recent med list.          ACCU-CHEK COMPACT PLUS test strip Generic drug:  glucose blood Use to check blood sugar once a day dx code E11.9   ACCU-CHEK SOFTCLIX LANCETS lancets Use to check blood sugar once a day dx code E11.9   amitriptyline 25 MG tablet Commonly known as:  ELAVIL Take 25 mg by mouth at bedtime.   doxazosin 2 MG tablet Commonly known as:  CARDURA Take 1 tablet (2 mg total) by mouth at bedtime.   ezetimibe 10 MG tablet Commonly known as:  ZETIA Take 10 mg by mouth daily.   folic acid 0.5 MG tablet Commonly known as:  FOLVITE Take 0.5 mg by mouth daily.   gabapentin 300 MG capsule Commonly known  as:  NEURONTIN   Ginger 500 MG Caps Take by mouth.   hydrochlorothiazide 12.5 MG capsule Commonly known as:  MICROZIDE TAKE 1 CAPSULE BY MOUTH ONCE DAILY   IRON PO Take 1 tablet by mouth daily.   KLOR-CON M20  20 MEQ tablet Generic drug:  potassium chloride SA TAKE 1 TABLET BY MOUTH DAILY.   labetalol 100 MG tablet Commonly known as:  NORMODYNE TAKE 1 TABLET (100 MG TOTAL) BY MOUTH 2 (TWO) TIMES DAILY.   metFORMIN 1000 MG tablet Commonly known as:  GLUCOPHAGE TAKE 1 TABLET (1,000 MG TOTAL) BY MOUTH 2 (TWO) TIMES DAILY.   methotrexate 2.5 MG tablet Commonly known as:  RHEUMATREX Take 15 mg by mouth once a week. Patient takes 3 tablets twice once a week to add up to be 6 tablets once weekly, starts with 3 tablets in the evening and takes 3 more tablets in the morning   NONFORMULARY OR COMPOUNDED ITEM [facility] compound:  Peripheral Neuropathy cream - Bu[ovacaome 1%, Doxepin 3%, Gabapentin 6%, Pentoxifylline 3%, Topiramate 1%, dispense 120grams, apply 1-2 grams to affected area 3-4 times daily, + 3 refills.   NUCYNTA 50 MG tablet Generic drug:  tapentadol Take 50 mg by mouth as needed for severe pain (spasms).   pravastatin 80 MG tablet Commonly known as:  PRAVACHOL TAKE 1 TABLET BY MOUTH DAILY   spironolactone 100 MG tablet Commonly known as:  ALDACTONE TAKE 1 TABLET (100 MG TOTAL) BY MOUTH DAILY.   Turmeric 450 MG Caps Take by mouth.   vitamin B-12 1000 MCG tablet Commonly known as:  CYANOCOBALAMIN Take 1,000 mcg by mouth daily.       Allergies:  Allergies  Allergen Reactions  . Acetaminophen Nausea Only  . Actos [Pioglitazone Hydrochloride]     edema  . Aspirin Nausea Only  . Atorvastatin Other (See Comments)    Felt drunk/high floating  . Morphine Sulfate Other (See Comments)    Burns injecting "arm is on fire"  . Naproxen Sodium Nausea Only  . Sitagliptin Phosphate Nausea And Vomiting    Past Medical History:  Diagnosis Date  . Allergic rhinitis   . Diabetes mellitus    TYPE 2  . Dyslipidemia   . Hyperkalemia   . Hypertension   . Insomnia    Pt stated no trouble falling or staying asleep.  . RA (rheumatoid arthritis) (HCC)     Past Surgical  History:  Procedure Laterality Date  . BREAST CYST EXCISION Right 1967  . HAND TENDON SURGERY     Right  . OVARIAN CYST SURGERY Right 1988    Family History  Problem Relation Age of Onset  . Diabetes Mother   . Diabetes Father   . Cancer Father        Prostate  . Diabetes Sister   . Stroke Maternal Grandmother        Cause of death  . Healthy Brother     Social History:  reports that  has never smoked. she has never used smokeless tobacco. She reports that she does not drink alcohol or use drugs.  Review of Systems:   History of rheumatoid arthritis on  Methotrexate   Vitamin D deficiency: Currently taking vitamin D3, 1000 U daily  LIPIDS: Taking Zetia from PCP and is off pravastatin 80 mg, she does not remember why she was told to stop his by another physician LDL has gone up significantly from last year No history of liver dysfunction  Lab Results  Component  Value Date   CHOL 192 10/17/2017   HDL 67.60 10/17/2017   LDLCALC 112 (H) 10/17/2017   TRIG 63.0 10/17/2017   CHOLHDL 3 10/17/2017   Lab Results  Component Value Date   ALT 23 09/11/2017      Examination:   BP 132/70   Pulse 86   Ht 5\' 4"  (1.626 m)   Wt 166 lb 9.6 oz (75.6 kg)   SpO2 98%   BMI 28.60 kg/m   Body mass index is 28.6 kg/m.      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   DIABETES: Well controlled and A1c is consistently in the upper normal range with metformin alone She is trying to walk fairly regularly and more now since her back pain is better  Also usually has good diet at home Discussed needing to check blood sugars after meals more  often and not just in the morning or at lunchtime  HYPERCHOLESTEROLEMIA: She does not know why she was told to stop Pravachol by another physician Since her liver functions are normal she can start back on this as LDL is much above 100 now   She will follow-up in 4 months   There are no Patient Instructions on file for this visit.   Michael Ventresca 10/20/2017, 8:17 AM

## 2017-10-20 NOTE — Patient Instructions (Addendum)
Restart Pravastatin  Check blood sugars on waking up  2/7 days  Also check blood sugars about 2 hours after a meal and do this after different meals by rotation  Recommended blood sugar levels on waking up is 90-130 and about 2 hours after meal is 130-160  Please bring your blood sugar monitor to each visit, thank you

## 2017-10-26 ENCOUNTER — Other Ambulatory Visit: Payer: Self-pay | Admitting: Endocrinology

## 2017-11-12 ENCOUNTER — Other Ambulatory Visit: Payer: Self-pay | Admitting: Endocrinology

## 2017-11-14 ENCOUNTER — Ambulatory Visit: Payer: PPO | Admitting: Cardiovascular Disease

## 2017-11-15 ENCOUNTER — Encounter: Payer: Self-pay | Admitting: Cardiovascular Disease

## 2017-11-15 ENCOUNTER — Ambulatory Visit: Payer: PPO | Admitting: Cardiovascular Disease

## 2017-11-15 VITALS — BP 132/74 | HR 72 | Ht 64.0 in | Wt 166.8 lb

## 2017-11-15 DIAGNOSIS — I10 Essential (primary) hypertension: Secondary | ICD-10-CM | POA: Diagnosis not present

## 2017-11-15 DIAGNOSIS — E782 Mixed hyperlipidemia: Secondary | ICD-10-CM

## 2017-11-15 MED ORDER — SPIRONOLACTONE 100 MG PO TABS: ORAL_TABLET | ORAL | 3 refills | Status: DC

## 2017-11-15 NOTE — Patient Instructions (Signed)
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
## 2017-11-21 ENCOUNTER — Ambulatory Visit: Payer: PPO | Admitting: Cardiovascular Disease

## 2017-11-22 ENCOUNTER — Other Ambulatory Visit: Payer: Self-pay | Admitting: Endocrinology
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
## 2017-12-11 ENCOUNTER — Telehealth: Payer: Self-pay | Admitting: Hematology

## 2017-12-11 ENCOUNTER — Inpatient Hospital Stay: Payer: PPO

## 2017-12-11 ENCOUNTER — Inpatient Hospital Stay: Payer: PPO | Attending: Hematology | Admitting: Hematology

## 2017-12-11 ENCOUNTER — Encounter: Payer: Self-pay | Admitting: Hematology

## 2017-12-11 ENCOUNTER — Other Ambulatory Visit: Payer: Self-pay | Admitting: *Deleted

## 2017-12-11 VITALS — BP 146/70 | HR 72 | Temp 97.7°F | Resp 18 | Ht 64.0 in | Wt 169.4 lb

## 2017-12-11 DIAGNOSIS — D638 Anemia in other chronic diseases classified elsewhere: Secondary | ICD-10-CM

## 2017-12-11 DIAGNOSIS — D649 Anemia, unspecified: Secondary | ICD-10-CM

## 2017-12-11 DIAGNOSIS — M069 Rheumatoid arthritis, unspecified: Secondary | ICD-10-CM | POA: Diagnosis not present

## 2017-12-11 DIAGNOSIS — D508 Other iron deficiency anemias: Secondary | ICD-10-CM

## 2017-12-11 LAB — CBC WITH DIFFERENTIAL (CANCER CENTER ONLY)
Basophils Absolute: 0 10*3/uL (ref 0.0–0.1)
Basophils Relative: 0 %
Eosinophils Absolute: 0.1 10*3/uL (ref 0.0–0.5)
Eosinophils Relative: 2 %
HCT: 31.4 % — ABNORMAL LOW (ref 34.8–46.6)
Hemoglobin: 10.3 g/dL — ABNORMAL LOW (ref 11.6–15.9)
Lymphocytes Relative: 23 %
Lymphs Abs: 1.3 10*3/uL (ref 0.9–3.3)
MCH: 29.9 pg (ref 25.1–34.0)
MCHC: 32.8 g/dL (ref 31.5–36.0)
MCV: 91.3 fL (ref 79.5–101.0)
Monocytes Absolute: 0.4 10*3/uL (ref 0.1–0.9)
Monocytes Relative: 7 %
Neutro Abs: 3.7 10*3/uL (ref 1.5–6.5)
Neutrophils Relative %: 68 %
Platelet Count: 273 10*3/uL (ref 145–400)
RBC: 3.43 MIL/uL — ABNORMAL LOW (ref 3.70–5.45)
RDW: 15.3 % (ref 11.2–16.1)
WBC Count: 5.5 10*3/uL (ref 4.0–10.3)

## 2017-12-11 LAB — COMPREHENSIVE METABOLIC PANEL
ALT: 27 U/L (ref 0–55)
AST: 25 U/L (ref 5–34)
Albumin: 4.1 g/dL (ref 3.5–5.0)
Alkaline Phosphatase: 81 U/L (ref 40–150)
Anion gap: 8 (ref 3–11)
BUN: 15 mg/dL (ref 7–26)
CO2: 27 mmol/L (ref 22–29)
Calcium: 9.8 mg/dL (ref 8.4–10.4)
Chloride: 105 mmol/L (ref 98–109)
Creatinine, Ser: 1.03 mg/dL (ref 0.60–1.10)
GFR calc Af Amer: >60 mL/min (ref >60)
GFR calc non Af Amer: 52 mL/min — ABNORMAL LOW (ref >60)
Glucose, Bld: 79 mg/dL (ref 70–140)
Potassium: 3.7 mmol/L (ref 3.3–4.7)
Sodium: 140 mmol/L (ref 136–145)
Total Bilirubin: 0.8 mg/dL (ref 0.2–1.2)
Total Protein: 7.2 g/dL (ref 6.4–8.3)

## 2017-12-11 LAB — IRON AND TIBC
Iron: 94 ug/dL (ref 41–142)
Saturation Ratios: 28 % (ref 21–57)
TIBC: 338 ug/dL (ref 236–444)
UIBC: 243 ug/dL

## 2017-12-11 LAB — FERRITIN: Ferritin: 89 ng/mL (ref 9–269)

## 2017-12-11 NOTE — Patient Instructions (Signed)
Thank you for choosing [facility] to provide your oncology and hematology care.  To afford each patient quality time with our providers, please arrive 30 minutes before your scheduled appointment time.  If you arrive late for your appointment, you may be asked to reschedule.  We strive to give you quality time with our providers, and arriving late affects you and other patients whose appointments are after yours.   If you are a no show for multiple scheduled visits, you may be dismissed from the clinic at the providers discretion.    Again, thank you for choosing [facility], our hope is that these requests will decrease the amount of time that you wait before being seen by our physicians.  ______________________________________________________________________  Should you have questions after your visit to the [facility], please contact our office at [phone] between the hours of 8:30 and 4:30 p.m.    Voicemails left after 4:30p.m will not be returned until the following business day.    For prescription refill requests, please have your pharmacy contact us directly.  Please also try to allow 48 hours for prescription requests.    Please contact the scheduling department for questions regarding scheduling.  For scheduling of procedures such as PET scans, CT scans, MRI, Ultrasound, etc please contact central scheduling at [phone].    Resources For Cancer Patients and Caregivers:   Oncolink.org:  A wonderful resource for patients and healthcare providers for information regarding your disease, ways to tract your treatment, what to expect, etc.     American Cancer Society:  800-227-2345  Can help patients locate various types of support and financial assistance  Cancer Care: 1-800-813-HOPE (4673) Provides financial assistance, online support groups, medication/co-pay assistance.    [place] DSS:  336-641-3447 Where to apply for food  stamps, Medicaid, and utility assistance  Medicare Rights Center: 800-333-4114 Helps people with Medicare understand their rights and benefits, navigate the Medicare system, and secure the quality healthcare they deserve  SCAT: 336-333-6589 [place] [employer]'s shared-ride transportation service for eligible riders who have a disability that prevents them from riding the fixed route bus.    For additional information on assistance programs please contact our social worker:   Grier Hock/Abigail Elmore:  [phone]            

## 2017-12-11 NOTE — Telephone Encounter (Signed)
Gave patient avs and calendar with appts per 1/7 los.  °

## 2017-12-12 ENCOUNTER — Other Ambulatory Visit: Payer: Self-pay | Admitting: Hematology

## 2017-12-12 ENCOUNTER — Telehealth: Payer: Self-pay | Admitting: *Deleted

## 2017-12-12 DIAGNOSIS — D508 Other iron deficiency anemias: Secondary | ICD-10-CM

## 2017-12-12 DIAGNOSIS — D638 Anemia in other chronic diseases classified elsewhere: Secondary | ICD-10-CM

## 2017-12-12 MED ORDER — POLYSACCHARIDE IRON COMPLEX 150 MG PO CAPS: 150.0000 mg | ORAL_CAPSULE | Freq: Every day | ORAL | 5 refills | Status: DC

## 2017-12-12 NOTE — Telephone Encounter (Signed)
Per Dr. Candise Che, SW pt regarding ferritin levels.  Instructed pt to begin taking OTC iron polysaccharide 150mg  daily to maintain ferritin greater than 100.  Pt verbalized understanding.

## 2017-12-15 DIAGNOSIS — Z79899 Other long term (current) drug therapy: Secondary | ICD-10-CM | POA: Diagnosis not present

## 2017-12-15 DIAGNOSIS — M150 Primary generalized (osteo)arthritis: Secondary | ICD-10-CM | POA: Diagnosis not present

## 2017-12-15 DIAGNOSIS — M2550 Pain in unspecified joint: Secondary | ICD-10-CM | POA: Diagnosis not present

## 2017-12-15 DIAGNOSIS — R5382 Chronic fatigue, unspecified: Secondary | ICD-10-CM | POA: Diagnosis not present

## 2017-12-15 DIAGNOSIS — M0589 Other rheumatoid arthritis with rheumatoid factor of multiple sites: Secondary | ICD-10-CM | POA: Diagnosis not present

## 2017-12-15 DIAGNOSIS — M549 Dorsalgia, unspecified: Secondary | ICD-10-CM | POA: Diagnosis not present

## 2017-12-15 DIAGNOSIS — M79606 Pain in leg, unspecified: Secondary | ICD-10-CM | POA: Diagnosis not present

## 2017-12-24 ENCOUNTER — Other Ambulatory Visit: Payer: Self-pay | Admitting: Endocrinology

## 2018-01-02 DIAGNOSIS — E119 Type 2 diabetes mellitus without complications: Secondary | ICD-10-CM | POA: Diagnosis not present

## 2018-01-02 LAB — HM DIABETES EYE EXAM

## 2018-01-16 DIAGNOSIS — J069 Acute upper respiratory infection, unspecified: Secondary | ICD-10-CM | POA: Diagnosis not present

## 2018-02-18 ENCOUNTER — Encounter: Payer: Self-pay | Admitting: Endocrinology

## 2018-02-18 NOTE — Progress Notes (Deleted)
Patient ID: April [name], female   DOB: 1941/04/09, 77 y.o.   MRN: [id number]   Reason for Appointment: follow-up of various problems  History of Present Illness    HYPERTENSION:  diagnosed around 1979 with symptoms of headaches Previously she was taking Avapro, clonidine 0.6 at bedtime and Dyazide but blood pressure was relatively high with this regimen For evaluation of her hyperaldosteronism she was switched to labetalol 100 mg twice a day, Tenex 2 mg and doxazosin With this regimen her blood pressure has been much better She was subsequently started on Aldactone to help with her severe hypokalemia  Her treatment regimen has been stable with 100 mg Aldactone, 12.5 mg hydrochlorothiazide and labetalol 100 mg twice a day, this has controlled her blood pressure consistently On her last visit doxazosin was stopped because of low-normal blood pressure readings   She has checked her blood pressure at the drug store or other places and it is about [id number] systolic   She does not complain of lightheadedness on standing up  She is compliant with her medications as prescribed  HYPOKALEMIA:  This previously had been a chronic problem and associated with hypertension  Previously had  been prescribed 6 tablets of potassium daily She was off her diuretics and Avapro when her evaluation for hyperaldosteronism was done, aldosterone level was 6.0 along with a relatively low renin level  She has a normal potassium level consistently with Aldactone 100 mg daily along with 1 tablet of potassium 20 mEq daily    Lab Results  Component Value Date   CREATININE 1.03 12/11/2017   BUN 15 12/11/2017   NA 140 12/11/2017   K 3.7 12/11/2017   CL 105 12/11/2017   CO2 27 12/11/2017    DIABETES type II:   This has been  well controlled with a regimen of metformin 1 g twice a day only  A1c has been consistently upper normal, now 6.0  She checks her blood sugars only  sporadically and mostly in the mornings or midday, usually range 10 5-130; has only 6 readings for the last month She had a reading of 52 in the morning but did not have any symptoms last month Also after steroid injection over a month ago she had a reading of 150 She is generally fairly good with diet, has been advised to cut back on amount of grapes and other sweet fruits   Her weight has been fluctuating    She tries to keep up with her walking up to 30 minutes, occasionally has difficulty walking    Wt Readings from Last 3 Encounters:  12/11/17 169 lb 6.4 oz (76.8 kg)  11/15/17 166 lb 12.8 oz (75.7 kg)  10/20/17 166 lb 9.6 oz (75.6 kg)      Lab Results  Component Value Date   HGBA1C 6.0 10/17/2017   HGBA1C 5.8 06/16/2017   HGBA1C 5.9 02/15/2017   Lab Results  Component Value Date   MICROALBUR 2.6 (H) 02/15/2017   LDLCALC 112 (H) 10/17/2017   CREATININE 1.03 12/11/2017    OTHER active problems discussed today are in review of systems  LABS:  No visits with results within 1 Week(s) from this visit.  Latest known visit with results is:  Office Visit on 12/11/2017  Component Date Value Ref Range Status  . Sodium 12/11/2017 140  136 - 145 mmol/L Final  . Potassium 12/11/2017 3.7  3.3 - 4.7 mmol/L Final  . Chloride 12/11/2017 105  98 - 109  mmol/L Final  . CO2 12/11/2017 27  22 - 29 mmol/L Final  . Glucose, Bld 12/11/2017 79  70 - 140 mg/dL Final  . BUN 12/11/2017 15  7 - 26 mg/dL Final  . Creatinine, Ser 12/11/2017 1.03  0.60 - 1.10 mg/dL Final  . Calcium 12/11/2017 9.8  8.4 - 10.4 mg/dL Final  . Total Protein 12/11/2017 7.2  6.4 - 8.3 g/dL Final  . Albumin 12/11/2017 4.1  3.5 - 5.0 g/dL Final  . AST 12/11/2017 25  5 - 34 U/L Final  . ALT 12/11/2017 27  0 - 55 U/L Final  . Alkaline Phosphatase 12/11/2017 81  40 - 150 U/L Final  . Total Bilirubin 12/11/2017 0.8  0.2 - 1.2 mg/dL Final  . GFR calc non Af Amer 12/11/2017 52* >60 mL/min Final  . GFR calc Af Amer  12/11/2017 >60  >60 mL/min Final   Comment: (NOTE) The eGFR has been calculated using the CKD EPI equation. This calculation has not been validated in all clinical situations. eGFR's persistently <60 mL/min signify possible Chronic Kidney Disease.   Georgiann Hahn gap 12/11/2017 8  3 - 11 Final   Performed at [facility], [place] [address], [place], [place] [address]    Allergies as of 02/19/2018      Reactions   Acetaminophen Nausea Only   Actos [pioglitazone Hydrochloride]    edema   Aspirin Nausea Only   Atorvastatin Other (See Comments)   Felt drunk/high floating   Morphine Sulfate Other (See Comments)   Burns injecting "arm is on fire"   Naproxen Sodium Nausea Only   Sitagliptin Phosphate Nausea And Vomiting      Medication List        Accurate as of 02/18/18  9:34 PM. Always use your most recent med list.          ACCU-CHEK COMPACT PLUS test strip Generic drug:  glucose blood Use to check blood sugar once a day dx code E11.9   ACCU-CHEK SOFTCLIX LANCETS lancets Use to check blood sugar once a day dx code E11.9   amitriptyline 25 MG tablet Commonly known as:  ELAVIL Take 25 mg by mouth at bedtime.   doxazosin 2 MG tablet Commonly known as:  CARDURA TAKE 1 TABLET BY MOUTH AT BEDTIME   ezetimibe 10 MG tablet Commonly known as:  ZETIA Take 10 mg by mouth daily.   folic acid 0.5 MG tablet Commonly known as:  FOLVITE Take 0.5 mg by mouth daily.   gabapentin 300 MG capsule Commonly known as:  NEURONTIN   Ginger 500 MG Caps Take by mouth.   hydrochlorothiazide 12.5 MG capsule Commonly known as:  MICROZIDE TAKE 1 CAPSULE BY MOUTH ONCE DAILY   iron polysaccharides 150 MG capsule Commonly known as:  NIFEREX Take 1 capsule (150 mg total) by mouth daily.   KLOR-CON M20 20 MEQ tablet Generic drug:  potassium chloride SA TAKE 1 TABLET BY MOUTH DAILY.   labetalol 100 MG tablet Commonly known as:  NORMODYNE TAKE 1 TABLET BY MOUTH TWICE A  DAY   metFORMIN 1000 MG tablet Commonly known as:  GLUCOPHAGE TAKE 1 TABLET (1,000 MG TOTAL) BY MOUTH 2 (TWO) TIMES DAILY.   methotrexate 2.5 MG tablet Commonly known as:  RHEUMATREX Take 15 mg by mouth once a week. Patient takes 3 tablets twice once a week to add up to be 6 tablets once weekly, starts with 3 tablets in the evening and takes 3 more tablets  in the morning   NONFORMULARY OR COMPOUNDED [place] compound:  Peripheral Neuropathy cream - Bu[ovacaome 1%, Doxepin 3%, Gabapentin 6%, Pentoxifylline 3%, Topiramate 1%, dispense 120grams, apply 1-2 grams to affected area 3-4 times daily, + 3 refills.   NUCYNTA 50 MG tablet Generic drug:  tapentadol Take 50 mg by mouth as needed for severe pain (spasms).   pravastatin 80 MG tablet Commonly known as:  PRAVACHOL TAKE 1 TABLET BY MOUTH DAILY   spironolactone 100 MG tablet Commonly known as:  ALDACTONE TAKE 1 TABLET (100 MG TOTAL) BY MOUTH DAILY.   Turmeric 450 MG Caps Take by mouth.   vitamin B-12 1000 MCG tablet Commonly known as:  CYANOCOBALAMIN Take 1,000 mcg by mouth daily.       Allergies:  Allergies  Allergen Reactions  . Acetaminophen Nausea Only  . Actos [Pioglitazone Hydrochloride]     edema  . Aspirin Nausea Only  . Atorvastatin Other (See Comments)    Felt drunk/high floating  . Morphine Sulfate Other (See Comments)    Burns injecting "arm is on fire"  . Naproxen Sodium Nausea Only  . Sitagliptin Phosphate Nausea And Vomiting    Past Medical History:  Diagnosis Date  . Allergic rhinitis   . Diabetes mellitus    TYPE 2  . Dyslipidemia   . Hyperkalemia   . Hypertension   . Insomnia    Pt stated no trouble falling or staying asleep.  . RA (rheumatoid arthritis) ([place])     Past Surgical History:  Procedure Laterality Date  . BREAST CYST EXCISION Right 1967  . HAND TENDON SURGERY     Right  . OVARIAN CYST SURGERY Right 1988    Family History  Problem Relation Age of Onset  .  Diabetes Mother   . Diabetes Father   . Cancer Father        Prostate  . Diabetes Sister   . Stroke Maternal Grandmother        Cause of death  . Healthy Brother     Social History:  reports that  has never smoked. she has never used smokeless tobacco. She reports that she does not drink alcohol or use drugs.  Review of Systems:   History of rheumatoid arthritis on  Methotrexate   Vitamin D deficiency: Currently taking vitamin D3, 1000 U daily  LIPIDS: Taking Zetia from PCP and is off pravastatin 80 mg, she does not remember why she was told to stop his by another physician LDL has gone up significantly from last year No history of liver dysfunction  Lab Results  Component Value Date   CHOL 192 10/17/2017   HDL 67.60 10/17/2017   LDLCALC 112 (H) 10/17/2017   TRIG 63.0 10/17/2017   CHOLHDL 3 10/17/2017   Lab Results  Component Value Date   ALT 27 12/11/2017   ALT 23 09/11/2017      Examination:   There were no vitals taken for this visit.  There is no height or weight on file to calculate BMI.      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   DIABETES: Well controlled and A1c  is consistently in the upper normal range with metformin alone She is trying to walk fairly regularly and more now since her back pain is better  Also usually has good diet at home Discussed needing to check blood sugars after meals more often and not just in the morning or at lunchtime  HYPERCHOLESTEROLEMIA: She does not know why she was told to stop Pravachol by another physician Since her liver functions are normal she can start back on this as LDL  is much above 100 now   She will follow-up in 4 months   There are no Patient Instructions on file for this visit.   Elayne Snare 02/18/2018, 9:34 PM

## 2018-02-19 ENCOUNTER — Other Ambulatory Visit (INDEPENDENT_AMBULATORY_CARE_PROVIDER_SITE_OTHER): Payer: PPO

## 2018-02-19 ENCOUNTER — Ambulatory Visit: Payer: PPO | Admitting: Endocrinology

## 2018-02-19 DIAGNOSIS — E782 Mixed hyperlipidemia: Secondary | ICD-10-CM

## 2018-02-19 DIAGNOSIS — E119 Type 2 diabetes mellitus without complications: Secondary | ICD-10-CM

## 2018-02-19 LAB — MICROALBUMIN / CREATININE URINE RATIO
Creatinine,U: 278.8 mg/dL
Microalb Creat Ratio: 1 mg/g (ref 0.0–30.0)
Microalb, Ur: 2.8 mg/dL — ABNORMAL HIGH (ref 0.0–1.9)

## 2018-02-19 LAB — LIPID PANEL
Cholesterol: 182 mg/dL (ref 0–200)
HDL: 66.7 mg/dL (ref >39.00)
LDL Cholesterol: 105 mg/dL — ABNORMAL HIGH (ref 0–99)
NonHDL: 115.3
Total CHOL/HDL Ratio: 3
Triglycerides: 53 mg/dL (ref 0.0–149.0)
VLDL: 10.6 mg/dL (ref 0.0–40.0)

## 2018-02-19 LAB — HEMOGLOBIN A1C: Hgb A1c MFr Bld: 5.7 % (ref 4.6–6.5)

## 2018-02-21 ENCOUNTER — Other Ambulatory Visit: Payer: Self-pay | Admitting: Endocrinology

## 2018-02-26 ENCOUNTER — Ambulatory Visit: Payer: PPO | Admitting: Endocrinology

## 2018-02-26 ENCOUNTER — Encounter: Payer: Self-pay | Admitting: Endocrinology

## 2018-02-26 VITALS — BP 120/78 | HR 79 | Ht 64.0 in | Wt 165.0 lb

## 2018-02-26 DIAGNOSIS — I10 Essential (primary) hypertension: Secondary | ICD-10-CM

## 2018-02-26 DIAGNOSIS — E119 Type 2 diabetes mellitus without complications: Secondary | ICD-10-CM

## 2018-02-26 DIAGNOSIS — E782 Mixed hyperlipidemia: Secondary | ICD-10-CM | POA: Diagnosis not present

## 2018-02-26 NOTE — Progress Notes (Signed)
Patient ID: April [name], female   DOB: 1941/05/09, 77 y.o.   MRN: [id number]   Reason for Appointment: follow-up of various problems  History of Present Illness    HYPERTENSION:  diagnosed around 1979 with symptoms of headaches Previously she was taking Avapro, clonidine 0.6 at bedtime and Dyazide but blood pressure was relatively high with this regimen For evaluation of her hyperaldosteronism she was switched to labetalol 100 mg twice a day, Tenex 2 mg and doxazosin With this regimen her blood pressure has been much better She was subsequently started on Aldactone to help with her severe hypokalemia  Her treatment regimen has been continued unchanged with 100 mg Aldactone, 12.5 mg hydrochlorothiazide and labetalol 100 mg twice a day, this has controlled her blood pressure consistently   She has checked her blood pressure at the drug store occasionally and she thinks the readings are always normal  She is compliant with her medications as prescribed  HYPOKALEMIA:  This previously had been a chronic problem and associated with hypertension  Previously had  been prescribed 6 tablets of potassium daily She was off her diuretics and Avapro when her evaluation for hyperaldosteronism was done, aldosterone level was 6.0 along with a relatively low renin level  She has a normal potassium level consistently with Aldactone 100 mg daily along with 1 tablet of potassium 20 mEq daily    Lab Results  Component Value Date   CREATININE 1.03 12/11/2017   BUN 15 12/11/2017   NA 140 12/11/2017   K 3.7 12/11/2017   CL 105 12/11/2017   CO2 27 12/11/2017    DIABETES type II:   This has been  well controlled with a regimen of metformin 1 g twice a day only  A1c has been consistently upper normal, now 5.7 compared to 6%  She checks her blood sugars only sporadically Recent fasting readings range from 93-104 with 1 higher reading of 137 which she thinks was from using cough  drops  She is fairly good with watching her diet, has been advised to cut back on amount of grapes and other sweet fruits   Her weight has been about the same  She has difficulty walking although is trying to do some    Wt Readings from Last 3 Encounters:  02/26/18 165 lb (74.8 kg)  12/11/17 169 lb 6.4 oz (76.8 kg)  11/15/17 166 lb 12.8 oz (75.7 kg)      Lab Results  Component Value Date   HGBA1C 5.7 02/19/2018   HGBA1C 6.0 10/17/2017   HGBA1C 5.8 06/16/2017   Lab Results  Component Value Date   MICROALBUR 2.8 (H) 02/19/2018   LDLCALC 105 (H) 02/19/2018   CREATININE 1.03 12/11/2017    OTHER active problems discussed today are in review of systems  LABS:  No visits with results within 1 Week(s) from this visit.  Latest known visit with results is:  Lab on 02/19/2018  Component Date Value Ref Range Status  . Cholesterol 02/19/2018 182  0 - 200 mg/dL Final   ATP III Classification       Desirable:  < 200 mg/dL               Borderline High:  200 - 239 mg/dL          High:  > = 629 mg/dL  . Triglycerides 02/19/2018 53.0  0.0 - 149.0 mg/dL Final   Normal:  <476 mg/dLBorderline High:  150 - 199 mg/dL  .  HDL 02/19/2018 66.70  >39.00 mg/dL Final  . VLDL 16/09/9603 10.6  0.0 - 40.0 mg/dL Final  . LDL Cholesterol 02/19/2018 105* 0 - 99 mg/dL Final  . Total CHOL/HDL Ratio 02/19/2018 3   Final                  Men          Women1/2 Average Risk     3.4          3.3Average Risk          5.0          4.42X Average Risk          9.6          7.13X Average Risk          15.0          11.0                      . NonHDL 02/19/2018 115.30   Final   NOTE:  Non-HDL goal should be 30 mg/dL higher than patient's LDL goal (i.e. LDL goal of < 70 mg/dL, would have non-HDL goal of < 100 mg/dL)  . Microalb, Ur 02/19/2018 2.8* 0.0 - 1.9 mg/dL Final  . Creatinine,U 54/08/8118 278.8  mg/dL Final  . Microalb Creat Ratio 02/19/2018 1.0  0.0 - 30.0 mg/g Final  . Hgb A1c MFr Bld 02/19/2018 5.7  4.6  - 6.5 % Final   Glycemic Control Guidelines for People with Diabetes:Non Diabetic:  <6%Goal of Therapy: <7%Additional Action Suggested:  >8%     Allergies as of 02/26/2018      Reactions   Acetaminophen Nausea Only   Actos [pioglitazone Hydrochloride]    edema   Aspirin Nausea Only   Atorvastatin Other (See Comments)   Felt drunk/high floating   Morphine Sulfate Other (See Comments)   Burns injecting "arm is on fire"   Naproxen Sodium Nausea Only   Sitagliptin Phosphate Nausea And Vomiting      Medication List        Accurate as of 02/26/18 10:15 AM. Always use your most recent med list.          ACCU-CHEK COMPACT PLUS test strip Generic drug:  glucose blood Use to check blood sugar once a day dx code E11.9   ACCU-CHEK SOFTCLIX LANCETS lancets Use to check blood sugar once a day dx code E11.9   amitriptyline 25 MG tablet Commonly known as:  ELAVIL Take 25 mg by mouth at bedtime.   doxazosin 2 MG tablet Commonly known as:  CARDURA TAKE 1 TABLET BY MOUTH AT BEDTIME   ezetimibe 10 MG tablet Commonly known as:  ZETIA Take 10 mg by mouth daily.   folic acid 0.5 MG tablet Commonly known as:  FOLVITE Take 0.5 mg by mouth daily.   gabapentin 300 MG capsule Commonly known as:  NEURONTIN   Ginger 500 MG Caps Take by mouth.   hydrochlorothiazide 12.5 MG capsule Commonly known as:  MICROZIDE TAKE 1 CAPSULE BY MOUTH ONCE DAILY   iron polysaccharides 150 MG capsule Commonly known as:  NIFEREX Take 1 capsule (150 mg total) by mouth daily.   KLOR-CON M20 20 MEQ tablet Generic drug:  potassium chloride SA TAKE 1 TABLET BY MOUTH DAILY.   labetalol 100 MG tablet Commonly known as:  NORMODYNE TAKE 1 TABLET BY MOUTH TWICE A DAY   metFORMIN 1000 MG tablet Commonly known as:  GLUCOPHAGE TAKE 1  TABLET (1,000 MG TOTAL) BY MOUTH 2 (TWO) TIMES DAILY.   methotrexate 2.5 MG tablet Commonly known as:  RHEUMATREX Take 15 mg by mouth once a week. Patient takes 3 tablets  twice once a week to add up to be 6 tablets once weekly, starts with 3 tablets in the evening and takes 3 more tablets in the morning   montelukast 10 MG tablet Commonly known as:  SINGULAIR   NONFORMULARY OR COMPOUNDED ITEM [facility] compound:  Peripheral Neuropathy cream - Bu[ovacaome 1%, Doxepin 3%, Gabapentin 6%, Pentoxifylline 3%, Topiramate 1%, dispense 120grams, apply 1-2 grams to affected area 3-4 times daily, + 3 refills.   NUCYNTA 50 MG tablet Generic drug:  tapentadol Take 50 mg by mouth as needed for severe pain (spasms).   pravastatin 80 MG tablet Commonly known as:  PRAVACHOL TAKE 1 TABLET BY MOUTH DAILY   spironolactone 100 MG tablet Commonly known as:  ALDACTONE TAKE 1 TABLET (100 MG TOTAL) BY MOUTH DAILY.   Turmeric 450 MG Caps Take by mouth.   vitamin B-12 1000 MCG tablet Commonly known as:  CYANOCOBALAMIN Take 1,000 mcg by mouth daily.       Allergies:  Allergies  Allergen Reactions  . Acetaminophen Nausea Only  . Actos [Pioglitazone Hydrochloride]     edema  . Aspirin Nausea Only  . Atorvastatin Other (See Comments)    Felt drunk/high floating  . Morphine Sulfate Other (See Comments)    Burns injecting "arm is on fire"  . Naproxen Sodium Nausea Only  . Sitagliptin Phosphate Nausea And Vomiting    Past Medical History:  Diagnosis Date  . Allergic rhinitis   . Diabetes mellitus    TYPE 2  . Dyslipidemia   . Hyperkalemia   . Hypertension   . Insomnia    Pt stated no trouble falling or staying asleep.  . RA (rheumatoid arthritis) ([facility])     Past Surgical History:  Procedure Laterality Date  . BREAST CYST EXCISION Right 1967  . HAND TENDON SURGERY     Right  . OVARIAN CYST SURGERY Right 1988    Family History  Problem Relation Age of Onset  . Diabetes Mother   . Diabetes Father   . Cancer Father        Prostate  . Diabetes Sister   . Stroke Maternal Grandmother        Cause of death  . Healthy Brother     Social  History:  reports that she has never smoked. She has never used smokeless tobacco. She reports that she does not drink alcohol or use drugs.  Review of Systems:   History of rheumatoid arthritis on  Methotrexate   Vitamin D deficiency: Currently taking vitamin D3, 1000 U daily  LIPIDS: Taking Zetia from PCP and is back on pravastatin 80 mg  Her diet can be somewhat high in fats with certain meats and using nondairy creamer in her cereal  No history of liver dysfunction  Lab Results  Component Value Date   CHOL 182 02/19/2018   HDL 66.70 02/19/2018   LDLCALC 105 (H) 02/19/2018   TRIG 53.0 02/19/2018   CHOLHDL 3 02/19/2018   Lab Results  Component Value Date   ALT 27 12/11/2017   ALT 23 09/11/2017      Examination:   BP 120/78 (BP Location: Left Arm, Patient Position: Sitting, Cuff Size: Normal)   Pulse 79   Ht 5\' 4"  (1.626 m)   Wt 165 lb (74.8 kg)  SpO2 97%   BMI 28.32 kg/m   Body mass index is 28.32 kg/m.      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   DIABETES: Well controlled and A1c is consistently in the upper normal range with metformin alone A1c now 5.7  She is trying to watch her diet and weight has been about the same She has some limitations in her activity level Discussed checking blood sugars periodically after meals and not just in the morning   HYPERCHOLESTEROLEMIA: She does have an LDL over 100 despite taking pravastatin and Zetia Discussed sources of saturated fat and she will try to improve her diet Discussed that she may need to be on Crestor if her LDL continues to be high    She will follow-up in 4 months   There are no Patient Instructions on  file for this visit.   Reather Littler 02/26/2018, 10:15 AM

## 2018-02-26 NOTE — Patient Instructions (Signed)
Use low fat milk   Check blood sugars on waking up  1-2/7  Also check blood sugars about 2 hours after a meal and do this after different meals by rotation  Recommended blood sugar levels on waking up is 90-130 and about 2 hours after meal is 130-160  Please bring your blood sugar monitor to each visit, thank you

## 2018-03-06 DIAGNOSIS — L989 Disorder of the skin and subcutaneous tissue, unspecified: Secondary | ICD-10-CM | POA: Diagnosis not present

## 2018-03-22 DIAGNOSIS — Z79899 Other long term (current) drug therapy: Secondary | ICD-10-CM | POA: Diagnosis not present

## 2018-03-22 DIAGNOSIS — M150 Primary generalized (osteo)arthritis: Secondary | ICD-10-CM | POA: Diagnosis not present

## 2018-03-22 DIAGNOSIS — M549 Dorsalgia, unspecified: Secondary | ICD-10-CM | POA: Diagnosis not present

## 2018-03-22 DIAGNOSIS — M2550 Pain in unspecified joint: Secondary | ICD-10-CM | POA: Diagnosis not present

## 2018-03-22 DIAGNOSIS — E119 Type 2 diabetes mellitus without complications: Secondary | ICD-10-CM | POA: Diagnosis not present

## 2018-03-22 DIAGNOSIS — M0589 Other rheumatoid arthritis with rheumatoid factor of multiple sites: Secondary | ICD-10-CM | POA: Diagnosis not present

## 2018-04-17 ENCOUNTER — Other Ambulatory Visit: Payer: Self-pay | Admitting: Endocrinology

## 2018-05-05 ENCOUNTER — Other Ambulatory Visit: Payer: Self-pay | Admitting: Endocrinology

## 2018-05-20 ENCOUNTER — Other Ambulatory Visit: Payer: Self-pay | Admitting: Endocrinology

## 2018-06-11 DIAGNOSIS — Z1389 Encounter for screening for other disorder: Secondary | ICD-10-CM | POA: Diagnosis not present

## 2018-06-11 DIAGNOSIS — E7800 Pure hypercholesterolemia, unspecified: Secondary | ICD-10-CM | POA: Diagnosis not present

## 2018-06-11 DIAGNOSIS — M069 Rheumatoid arthritis, unspecified: Secondary | ICD-10-CM | POA: Diagnosis not present

## 2018-06-11 DIAGNOSIS — J309 Allergic rhinitis, unspecified: Secondary | ICD-10-CM | POA: Diagnosis not present

## 2018-06-11 DIAGNOSIS — E1140 Type 2 diabetes mellitus with diabetic neuropathy, unspecified: Secondary | ICD-10-CM | POA: Diagnosis not present

## 2018-06-11 DIAGNOSIS — Z0000 Encounter for general adult medical examination without abnormal findings: Secondary | ICD-10-CM | POA: Diagnosis not present

## 2018-06-11 DIAGNOSIS — E663 Overweight: Secondary | ICD-10-CM | POA: Diagnosis not present

## 2018-06-11 DIAGNOSIS — I10 Essential (primary) hypertension: Secondary | ICD-10-CM | POA: Diagnosis not present

## 2018-06-11 DIAGNOSIS — D649 Anemia, unspecified: Secondary | ICD-10-CM | POA: Diagnosis not present
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
## 2018-06-15 ENCOUNTER — Inpatient Hospital Stay: Payer: PPO | Attending: Hematology | Admitting: Hematology

## 2018-06-15 ENCOUNTER — Telehealth: Payer: Self-pay

## 2018-06-15 ENCOUNTER — Inpatient Hospital Stay: Payer: PPO

## 2018-06-15 ENCOUNTER — Encounter: Payer: Self-pay | Admitting: Hematology

## 2018-06-15 VITALS — BP 138/78 | HR 82 | Temp 97.7°F | Resp 18 | Ht 64.0 in | Wt 165.0 lb

## 2018-06-15 DIAGNOSIS — D508 Other iron deficiency anemias: Secondary | ICD-10-CM

## 2018-06-15 DIAGNOSIS — M069 Rheumatoid arthritis, unspecified: Secondary | ICD-10-CM

## 2018-06-15 DIAGNOSIS — D638 Anemia in other chronic diseases classified elsewhere: Secondary | ICD-10-CM | POA: Insufficient documentation

## 2018-06-15 LAB — IRON AND TIBC
Iron: 57 ug/dL (ref 41–142)
Saturation Ratios: 14 % — ABNORMAL LOW (ref 21–57)
TIBC: 398 ug/dL (ref 236–444)
UIBC: 340 ug/dL

## 2018-06-15 LAB — FERRITIN: Ferritin: 144 ng/mL (ref 11–307)

## 2018-06-15 LAB — CBC WITH DIFFERENTIAL (CANCER CENTER ONLY)
Basophils Absolute: 0 10*3/uL (ref 0.0–0.1)
Basophils Relative: 0 %
Eosinophils Absolute: 0.1 10*3/uL (ref 0.0–0.5)
Eosinophils Relative: 2 %
HCT: 33.4 % — ABNORMAL LOW (ref 34.8–46.6)
Hemoglobin: 10.4 g/dL — ABNORMAL LOW (ref 11.6–15.9)
Lymphocytes Relative: 21 %
Lymphs Abs: 1.2 10*3/uL (ref 0.9–3.3)
MCH: 28.5 pg (ref 25.1–34.0)
MCHC: 31.1 g/dL — ABNORMAL LOW (ref 31.5–36.0)
MCV: 91.5 fL (ref 79.5–101.0)
Monocytes Absolute: 0.6 10*3/uL (ref 0.1–0.9)
Monocytes Relative: 11 %
Neutro Abs: 3.9 10*3/uL (ref 1.5–6.5)
Neutrophils Relative %: 66 %
Platelet Count: 291 10*3/uL (ref 145–400)
RBC: 3.65 MIL/uL — ABNORMAL LOW (ref 3.70–5.45)
RDW: 16.3 % — ABNORMAL HIGH (ref 11.2–14.5)
WBC Count: 5.9 10*3/uL (ref 3.9–10.3)

## 2018-06-15 LAB — RETICULOCYTES
RBC.: 3.65 MIL/uL — ABNORMAL LOW (ref 3.70–5.45)
Retic Count, Absolute: 43.8 10*3/uL (ref 33.7–90.7)
Retic Ct Pct: 1.2 % (ref 0.7–2.1)

## 2018-06-15 NOTE — Telephone Encounter (Signed)
Printed avs and calender of upcoming appointment. Per 7/12 los 

## 2018-06-16 ENCOUNTER — Other Ambulatory Visit: Payer: Self-pay | Admitting: Hematology

## 2018-06-20 ENCOUNTER — Other Ambulatory Visit: Payer: Self-pay | Admitting: Endocrinology

## 2018-06-25 ENCOUNTER — Other Ambulatory Visit (INDEPENDENT_AMBULATORY_CARE_PROVIDER_SITE_OTHER): Payer: PPO

## 2018-06-25 DIAGNOSIS — E119 Type 2 diabetes mellitus without complications: Secondary | ICD-10-CM | POA: Diagnosis not present

## 2018-06-25 DIAGNOSIS — E782 Mixed hyperlipidemia: Secondary | ICD-10-CM

## 2018-06-25 LAB — LIPID PANEL
Cholesterol: 149 mg/dL (ref 0–200)
HDL: 66.6 mg/dL (ref >39.00)
LDL Cholesterol: 73 mg/dL (ref 0–99)
NonHDL: 82.32
Total CHOL/HDL Ratio: 2
Triglycerides: 45 mg/dL (ref 0.0–149.0)
VLDL: 9 mg/dL (ref 0.0–40.0)

## 2018-06-25 LAB — COMPREHENSIVE METABOLIC PANEL
ALT: 20 U/L (ref 0–35)
AST: 20 U/L (ref 0–37)
Albumin: 4.5 g/dL (ref 3.5–5.2)
Alkaline Phosphatase: 92 U/L (ref 39–117)
BUN: 22 mg/dL (ref 6–23)
CO2: 27 mEq/L (ref 19–32)
Calcium: 9.9 mg/dL (ref 8.4–10.5)
Chloride: 101 mEq/L (ref 96–112)
Creatinine, Ser: 1.35 mg/dL — ABNORMAL HIGH (ref 0.40–1.20)
GFR: 49.16 mL/min — ABNORMAL LOW (ref >60.00)
Glucose, Bld: 107 mg/dL — ABNORMAL HIGH (ref 70–99)
Potassium: 4.4 mEq/L (ref 3.5–5.1)
Sodium: 136 mEq/L (ref 135–145)
Total Bilirubin: 0.5 mg/dL (ref 0.2–1.2)
Total Protein: 7.7 g/dL (ref 6.0–8.3)

## 2018-06-25 LAB — HEMOGLOBIN A1C: Hgb A1c MFr Bld: 6.1 % (ref 4.6–6.5)

## 2018-06-28 ENCOUNTER — Ambulatory Visit (INDEPENDENT_AMBULATORY_CARE_PROVIDER_SITE_OTHER): Payer: PPO | Admitting: Endocrinology

## 2018-06-28 ENCOUNTER — Encounter: Payer: Self-pay | Admitting: Endocrinology

## 2018-06-28 VITALS — BP 122/70 | HR 80 | Ht 64.0 in | Wt 165.6 lb

## 2018-06-28 DIAGNOSIS — I10 Essential (primary) hypertension: Secondary | ICD-10-CM

## 2018-06-28 DIAGNOSIS — E119 Type 2 diabetes mellitus without complications: Secondary | ICD-10-CM

## 2018-06-28 NOTE — Patient Instructions (Signed)
Doxazosin 1/2 pill daily

## 2018-06-28 NOTE — Progress Notes (Signed)
Patient ID: April [name], female   DOB: 1941-03-27, 77 y.o.   MRN: [id number]   Reason for Appointment: follow-up of various problems  History of Present Illness    HYPERTENSION:  diagnosed around 1979 with symptoms of headaches Previously she was taking Avapro, clonidine 0.6 at bedtime and Dyazide but blood pressure was relatively high with this regimen  For evaluation of her hyperaldosteronism she was switched to labetalol 100 mg twice a day, Tenex 2 mg and doxazosin With this regimen her blood pressure has been much better She was subsequently started on Aldactone to help with her severe hypokalemia  Her treatment regimen has been continued unchanged along with 12.5 mg hydrochlorothiazide  Blood pressure has been consistently controlled   She has checked her blood pressure at the drug store occasionally with usually normal readings  BP Readings from Last 3 Encounters:  06/28/18 122/70  06/15/18 138/78  02/26/18 120/78     HYPOKALEMIA:  This previously had been a chronic problem and associated with hypertension  Previously had  been prescribed 6 tablets of potassium daily She was off her diuretics and Avapro when her evaluation for hyperaldosteronism was done, aldosterone level was 6.0 along with a relatively low renin level  She has a normal potassium level recently again with Aldactone 100 mg daily along with 1 tablet of potassium 20 mEq daily    Lab Results  Component Value Date   CREATININE 1.35 (H) 06/25/2018   BUN 22 06/25/2018   NA 136 06/25/2018   K 4.4 06/25/2018   CL 101 06/25/2018   CO2 27 06/25/2018    DIABETES type II:   She has had diabetes since at least 2015 This has been  well controlled with a regimen of metformin 1 g twice a day only  A1c has been consistently upper normal, now  6.1% She is not able to exercise because of musculoskeletal problems, does a little yard work Usually tries to watch her portions and sweets  She  checks her blood sugars only occasionally and mostly in the mornings  Recent fasting readings on her Accu-Chek download are between 77 up to 126, nonfasting 108 Overall AVERAGE 108   Her weight has been about the same    Wt Readings from Last 3 Encounters:  06/28/18 165 lb 9.6 oz (75.1 kg)  06/15/18 165 lb (74.8 kg)  02/26/18 165 lb (74.8 kg)      Lab Results  Component Value Date   HGBA1C 6.1 06/25/2018   HGBA1C 5.7 02/19/2018   HGBA1C 6.0 10/17/2017   Lab Results  Component Value Date   MICROALBUR 2.8 (H) 02/19/2018   LDLCALC 73 06/25/2018   CREATININE 1.35 (H) 06/25/2018    OTHER active problems discussed today are in review of systems  LABS:  Lab on 06/25/2018  Component Date Value Ref Range Status  . Cholesterol 06/25/2018 149  0 - 200 mg/dL Final   ATP III Classification       Desirable:  < 200 mg/dL               Borderline High:  200 - 239 mg/dL          High:  > = 956 mg/dL  . Triglycerides 06/25/2018 45.0  0.0 - 149.0 mg/dL Final   Normal:  <387 mg/dLBorderline High:  150 - 199 mg/dL  . HDL 06/25/2018 66.60  >39.00 mg/dL Final  . VLDL 56/43/3295 9.0  0.0 - 18.8 mg/dL Final  . LDL  Cholesterol 06/25/2018 73  0 - 99 mg/dL Final  . Total CHOL/HDL Ratio 06/25/2018 2   Final                  Men          Women1/2 Average Risk     3.4          3.3Average Risk          5.0          4.42X Average Risk          9.6          7.13X Average Risk          15.0          11.0                      . NonHDL 06/25/2018 82.32   Final   NOTE:  Non-HDL goal should be 30 mg/dL higher than patient's LDL goal (i.e. LDL goal of < 70 mg/dL, would have non-HDL goal of < 100 mg/dL)  . Sodium 06/25/2018 136  135 - 145 mEq/L Final  . Potassium 06/25/2018 4.4  3.5 - 5.1 mEq/L Final  . Chloride 06/25/2018 101  96 - 112 mEq/L Final  . CO2 06/25/2018 27  19 - 32 mEq/L Final  . Glucose, Bld 06/25/2018 107* 70 - 99 mg/dL Final  . BUN 16/55/3748 22  6 - 23 mg/dL Final  . Creatinine, Ser  06/25/2018 1.35* 0.40 - 1.20 mg/dL Final  . Total Bilirubin 06/25/2018 0.5  0.2 - 1.2 mg/dL Final  . Alkaline Phosphatase 06/25/2018 92  39 - 117 U/L Final  . AST 06/25/2018 20  0 - 37 U/L Final  . ALT 06/25/2018 20  0 - 35 U/L Final  . Total Protein 06/25/2018 7.7  6.0 - 8.3 g/dL Final  . Albumin 27/06/8674 4.5  3.5 - 5.2 g/dL Final  . Calcium 44/92/0100 9.9  8.4 - 10.5 mg/dL Final  . GFR 71/21/9758 49.16* >60.00 mL/min Final  . Hgb A1c MFr Bld 06/25/2018 6.1  4.6 - 6.5 % Final   Glycemic Control Guidelines for People with Diabetes:Non Diabetic:  <6%Goal of Therapy: <7%Additional Action Suggested:  >8%     Allergies as of 06/28/2018      Reactions   Acetaminophen Nausea Only   Actos [pioglitazone Hydrochloride]    edema   Aspirin Nausea Only   Atorvastatin Other (See Comments)   Felt drunk/high floating   Morphine Sulfate Other (See Comments)   Burns injecting "arm is on fire"   Naproxen Sodium Nausea Only   Sitagliptin Phosphate Nausea And Vomiting      Medication List        Accurate as of 06/28/18  9:54 AM. Always use your most recent med list.          ACCU-CHEK COMPACT PLUS test strip Generic drug:  glucose blood Use to check blood sugar once a day dx code E11.9   ACCU-CHEK SOFTCLIX LANCETS lancets Use to check blood sugar once a day dx code E11.9   amitriptyline 25 MG tablet Commonly known as:  ELAVIL Take 25 mg by mouth at bedtime.   doxazosin 2 MG tablet Commonly known as:  CARDURA TAKE 1 TABLET BY MOUTH AT BEDTIME   ezetimibe 10 MG tablet Commonly known as:  ZETIA Take 10 mg by mouth daily.   FERREX 150 150 MG capsule Generic drug:  iron polysaccharides TAKE 1  CAPSULE BY MOUTH EVERY DAY   folic acid 0.5 MG tablet Commonly known as:  FOLVITE Take 0.5 mg by mouth daily.   gabapentin 300 MG capsule Commonly known as:  NEURONTIN   Ginger 500 MG Caps Take by mouth.   hydrochlorothiazide 12.5 MG capsule Commonly known as:  MICROZIDE TAKE 1  CAPSULE BY MOUTH ONCE DAILY   KLOR-CON M20 20 MEQ tablet Generic drug:  potassium chloride SA TAKE 1 TABLET BY MOUTH DAILY.   labetalol 100 MG tablet Commonly known as:  NORMODYNE TAKE 1 TABLET BY MOUTH TWICE A DAY   metFORMIN 1000 MG tablet Commonly known as:  GLUCOPHAGE TAKE 1 TABLET (1,000 MG TOTAL) BY MOUTH 2 (TWO) TIMES DAILY.   methotrexate 2.5 MG tablet Commonly known as:  RHEUMATREX Take 15 mg by mouth once a week. Patient takes 3 tablets twice once a week to add up to be 6 tablets once weekly, starts with 3 tablets in the evening and takes 3 more tablets in the morning   montelukast 10 MG tablet Commonly known as:  SINGULAIR   NONFORMULARY OR COMPOUNDED ITEM [facility] compound:  Peripheral Neuropathy cream - Bu[ovacaome 1%, Doxepin 3%, Gabapentin 6%, Pentoxifylline 3%, Topiramate 1%, dispense 120grams, apply 1-2 grams to affected area 3-4 times daily, + 3 refills.   NUCYNTA 50 MG tablet Generic drug:  tapentadol Take 50 mg by mouth as needed for severe pain (spasms).   pravastatin 80 MG tablet Commonly known as:  PRAVACHOL TAKE 1 TABLET BY MOUTH DAILY   spironolactone 100 MG tablet Commonly known as:  ALDACTONE TAKE 1 TABLET (100 MG TOTAL) BY MOUTH DAILY.   Turmeric 450 MG Caps Take by mouth.   vitamin B-12 1000 MCG tablet Commonly known as:  CYANOCOBALAMIN Take 1,000 mcg by mouth daily.       Allergies:  Allergies  Allergen Reactions  . Acetaminophen Nausea Only  . Actos [Pioglitazone Hydrochloride]     edema  . Aspirin Nausea Only  . Atorvastatin Other (See Comments)    Felt drunk/high floating  . Morphine Sulfate Other (See Comments)    Burns injecting "arm is on fire"  . Naproxen Sodium Nausea Only  . Sitagliptin Phosphate Nausea And Vomiting    Past Medical History:  Diagnosis Date  . Allergic rhinitis   . Diabetes mellitus    TYPE 2  . Dyslipidemia   . Hyperkalemia   . Hypertension   . Insomnia    Pt stated no trouble  falling or staying asleep.  . RA (rheumatoid arthritis) (HCC)     Past Surgical History:  Procedure Laterality Date  . BREAST CYST EXCISION Right 1967  . HAND TENDON SURGERY     Right  . OVARIAN CYST SURGERY Right 1988    Family History  Problem Relation Age of Onset  . Diabetes Mother   . Diabetes Father   . Cancer Father        Prostate  . Diabetes Sister   . Stroke Maternal Grandmother        Cause of death  . Healthy Brother     Social History:  reports that she has never smoked. She has never used smokeless tobacco. She reports that she does not drink alcohol or use drugs.  Review of Systems:   History of rheumatoid arthritis on  Methotrexate   Vitamin D deficiency: Currently taking vitamin D3, 1000 U daily  LIPIDS: Taking Zetia from PCP and is on pravastatin 80 mg  No history of  liver dysfunction  Lab Results  Component Value Date   CHOL 149 06/25/2018   HDL 66.60 06/25/2018   LDLCALC 73 06/25/2018   TRIG 45.0 06/25/2018   CHOLHDL 2 06/25/2018   Lab Results  Component Value Date   ALT 20 06/25/2018   ALT 23 09/11/2017      Examination:   BP 122/70 (BP Location: Left Arm, Patient Position: Sitting, Cuff Size: Normal)   Pulse 80   Ht 5\' 4"  (1.626 m)   Wt 165 lb 9.6 oz (75.1 kg)   SpO2 97%   BMI 28.43 kg/m   Body mass index is 28.43 kg/m.      Assesment/PLAN:  Hypertension:  Blood pressure is very well controlled although on the low normal side even at home She is on a 3 drug regimen of  Aldactone, labetalol and  HCTZ   RENAL dysfunction: Her creatinine is slightly higher than usual at 1.35 compared to 1.03 Recommendations: Reduce doxazosin to half a tablet as blood pressure is low normal  Hypokalemia: She has a consistently normal potassium level with 100 mg Aldactone and Supplementing with only 1 tablet of potassium  Likely has renal potassium wasting and not hyperaldosteronism   DIABETES: Well controlled and A1c is consistently in  the upper normal range with metformin alone A1c 6.1 and home blood sugars are fairly good also  She is trying to watch her diet and weight has been stable She does try to do a little yard work and other activities but limited in her ability to walk She can continue to check her sugars about 2 or 3 times a week but more readings after meals  HYPERCHOLESTEROLEMIA: LDL has come down below 100 now without any change in medications and she will continue    She will follow-up in 4 months   Patient Instructions  Doxazosin 1/2 pill daily      06/28/2018, 9:54 AM

## 2018-07-02 ENCOUNTER — Ambulatory Visit: Payer: PPO | Admitting: Cardiovascular Disease

## 2018-07-02 ENCOUNTER — Encounter: Payer: Self-pay | Admitting: Cardiovascular Disease

## 2018-07-02 VITALS — BP 94/62 | HR 85 | Ht 63.0 in | Wt 165.4 lb

## 2018-07-02 DIAGNOSIS — I10 Essential (primary) hypertension: Secondary | ICD-10-CM

## 2018-07-02 DIAGNOSIS — E782 Mixed hyperlipidemia: Secondary | ICD-10-CM | POA: Diagnosis not present

## 2018-07-02 NOTE — Patient Instructions (Signed)
Medication Instructions:  Your physician has recommended you make the following change in your medication:   STOP Cardura (Doxazosin)   Labwork: None Ordered   Testing/Procedures: None Ordered   Follow-Up: Your physician wants you to follow-up in: 1 year with Dr. Elease Hashimoto. You will receive a reminder letter in the mail two months in advance. If you don't receive a letter, please call our office to schedule the follow-up appointment.   If you need a refill on your cardiac medications before your next appointment, please call your pharmacy.   Thank you for choosing [facility]! Eligha Bridegroom, RN [phone]
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
## 2018-07-10 DIAGNOSIS — N289 Disorder of kidney and ureter, unspecified: Secondary | ICD-10-CM | POA: Diagnosis not present

## 2018-07-10 DIAGNOSIS — M150 Primary generalized (osteo)arthritis: Secondary | ICD-10-CM | POA: Diagnosis not present

## 2018-07-10 DIAGNOSIS — Z79899 Other long term (current) drug therapy: Secondary | ICD-10-CM | POA: Diagnosis not present

## 2018-07-10 DIAGNOSIS — M0589 Other rheumatoid arthritis with rheumatoid factor of multiple sites: Secondary | ICD-10-CM | POA: Diagnosis not present

## 2018-07-10 DIAGNOSIS — E119 Type 2 diabetes mellitus without complications: Secondary | ICD-10-CM | POA: Diagnosis not present

## 2018-07-10 DIAGNOSIS — M549 Dorsalgia, unspecified: Secondary | ICD-10-CM | POA: Diagnosis not present

## 2018-08-02 DIAGNOSIS — M47817 Spondylosis without myelopathy or radiculopathy, lumbosacral region: Secondary | ICD-10-CM | POA: Diagnosis not present

## 2018-08-10 DIAGNOSIS — G894 Chronic pain syndrome: Secondary | ICD-10-CM | POA: Diagnosis not present

## 2018-08-11 DIAGNOSIS — Z1231 Encounter for screening mammogram for malignant neoplasm of breast: Secondary | ICD-10-CM | POA: Diagnosis not present

## 2018-08-23 DIAGNOSIS — M25531 Pain in right wrist: Secondary | ICD-10-CM | POA: Diagnosis not present

## 2018-08-23 DIAGNOSIS — M25532 Pain in left wrist: Secondary | ICD-10-CM | POA: Diagnosis not present

## 2018-08-31 ENCOUNTER — Other Ambulatory Visit: Payer: Self-pay | Admitting: Endocrinology

## 2018-10-04 ENCOUNTER — Other Ambulatory Visit (INDEPENDENT_AMBULATORY_CARE_PROVIDER_SITE_OTHER): Payer: PPO

## 2018-10-04 ENCOUNTER — Other Ambulatory Visit: Payer: Self-pay | Admitting: Endocrinology

## 2018-10-04 DIAGNOSIS — E119 Type 2 diabetes mellitus without complications: Secondary | ICD-10-CM | POA: Diagnosis not present

## 2018-10-04 LAB — COMPREHENSIVE METABOLIC PANEL
ALT: 19 U/L (ref 0–35)
AST: 19 U/L (ref 0–37)
Albumin: 4.5 g/dL (ref 3.5–5.2)
Alkaline Phosphatase: 97 U/L (ref 39–117)
BUN: 24 mg/dL — ABNORMAL HIGH (ref 6–23)
CO2: 27 mEq/L (ref 19–32)
Calcium: 9.8 mg/dL (ref 8.4–10.5)
Chloride: 103 mEq/L (ref 96–112)
Creatinine, Ser: 1.3 mg/dL — ABNORMAL HIGH (ref 0.40–1.20)
GFR: 51.31 mL/min — ABNORMAL LOW (ref >60.00)
Glucose, Bld: 89 mg/dL (ref 70–99)
Potassium: 4.5 mEq/L (ref 3.5–5.1)
Sodium: 137 mEq/L (ref 135–145)
Total Bilirubin: 0.4 mg/dL (ref 0.2–1.2)
Total Protein: 7.4 g/dL (ref 6.0–8.3)

## 2018-10-04 LAB — HEMOGLOBIN A1C: Hgb A1c MFr Bld: 6.1 % (ref 4.6–6.5)

## 2018-10-05 ENCOUNTER — Other Ambulatory Visit: Payer: Self-pay | Admitting: Endocrinology

## 2018-10-08 ENCOUNTER — Encounter: Payer: Self-pay | Admitting: Endocrinology

## 2018-10-08 ENCOUNTER — Other Ambulatory Visit: Payer: PPO

## 2018-10-08 ENCOUNTER — Ambulatory Visit (INDEPENDENT_AMBULATORY_CARE_PROVIDER_SITE_OTHER): Payer: PPO | Admitting: Endocrinology

## 2018-10-08 VITALS — BP 124/74 | HR 72 | Ht 64.0 in | Wt 169.0 lb

## 2018-10-08 DIAGNOSIS — I10 Essential (primary) hypertension: Secondary | ICD-10-CM

## 2018-10-08 DIAGNOSIS — E119 Type 2 diabetes mellitus without complications: Secondary | ICD-10-CM | POA: Diagnosis not present

## 2018-10-08 NOTE — Progress Notes (Signed)
Patient ID: April [name], female   DOB: 05/23/1941, 77 y.o.   MRN: [id number]   Reason for Appointment: follow-up of various problems  History of Present Illness    HYPERTENSION:  diagnosed around 1979 with symptoms of headaches Previously she was taking Avapro, clonidine 0.6 at bedtime and Dyazide but blood pressure was relatively high with this regimen  For evaluation of her hyperaldosteronism she was switched to labetalol 100 mg twice a day, Tenex 2 mg and doxazosin With this regimen her blood pressure has been much better She was subsequently started on Aldactone to help with her severe hypokalemia  Her treatment regimen has been continued except her doxazosin was stopped in July when her blood pressure was relatively low She now thinks that she may have had a passing out episode in June which she did not report last time No lightheadedness with her regimen currently including 12.5 mg hydrochlorothiazide  Blood pressure has been recently controlled   She has checked her blood pressure at the drug store occasionally: Readings about 120/62  BP Readings from Last 3 Encounters:  07/02/18 94/62  06/28/18 122/70  06/15/18 138/78     HYPOKALEMIA:   This previously had been a chronic problem and associated with hypertension  Previously had  been prescribed 6 tablets of potassium daily She was off her diuretics and Avapro when her evaluation for hyperaldosteronism was done, aldosterone level was 6.0 along with a relatively low renin level  She has a normal potassium level recently with Aldactone 100 mg daily along with 1 tablet of potassium 20 mEq daily   Lab Results  Component Value Date   CREATININE 1.30 (H) 10/04/2018   BUN 24 (H) 10/04/2018   NA 137 10/04/2018   K 4.5 10/04/2018   CL 103 10/04/2018   CO2 27 10/04/2018    DIABETES type II:   She has had diabetes since at least 2015 This has been  well controlled with a regimen of metformin 1 g  twice a day monotherapy  A1c has been consistently upper normal, again 6.1%  Usually tries to watch her dietary content but she has gained a little weight She is eating instead she is trying to be a little more active with some walking  She checks her blood sugars periodically but usually not after meals  Most recent fasting GLUCOSE readings on her Accu-Chek download: 86-122 Early afternoon 82-124  Overall AVERAGE 105   Weight history:   Wt Readings from Last 3 Encounters:  10/08/18 169 lb (76.7 kg)  07/02/18 165 lb 6.4 oz (75 kg)  06/28/18 165 lb 9.6 oz (75.1 kg)      Lab Results  Component Value Date   HGBA1C 6.1 10/04/2018   HGBA1C 6.1 06/25/2018   HGBA1C 5.7 02/19/2018   Lab Results  Component Value Date   MICROALBUR 2.8 (H) 02/19/2018   LDLCALC 73 06/25/2018   CREATININE 1.30 (H) 10/04/2018    OTHER active problems discussed today are in review of systems  LABS:  Lab on 10/04/2018  Component Date Value Ref Range Status  . Sodium 10/04/2018 137  135 - 145 mEq/L Final  . Potassium 10/04/2018 4.5  3.5 - 5.1 mEq/L Final  . Chloride 10/04/2018 103  96 - 112 mEq/L Final  . CO2 10/04/2018 27  19 - 32 mEq/L Final  . Glucose, Bld 10/04/2018 89  70 - 99 mg/dL Final  . BUN 40/98/1191 24* 6 - 23 mg/dL Final  . Creatinine, Ser  10/04/2018 1.30* 0.40 - 1.20 mg/dL Final  . Total Bilirubin 10/04/2018 0.4  0.2 - 1.2 mg/dL Final  . Alkaline Phosphatase 10/04/2018 97  39 - 117 U/L Final  . AST 10/04/2018 19  0 - 37 U/L Final  . ALT 10/04/2018 19  0 - 35 U/L Final  . Total Protein 10/04/2018 7.4  6.0 - 8.3 g/dL Final  . Albumin 16/09/9603 4.5  3.5 - 5.2 g/dL Final  . Calcium 54/08/8118 9.8  8.4 - 10.5 mg/dL Final  . GFR 14/78/2956 51.31* >60.00 mL/min Final  . Hgb A1c MFr Bld 10/04/2018 6.1  4.6 - 6.5 % Final   Glycemic Control Guidelines for People with Diabetes:Non Diabetic:  <6%Goal of Therapy: <7%Additional Action Suggested:  >8%     Allergies as of 10/08/2018       Reactions   Acetaminophen Nausea Only   Actos [pioglitazone Hydrochloride]    edema   Aspirin Nausea Only   Atorvastatin Other (See Comments)   Felt drunk/high floating   Morphine Sulfate Other (See Comments)   Burns injecting "arm is on fire"   Naproxen Sodium Nausea Only   Sitagliptin Phosphate Nausea And Vomiting      Medication List        Accurate as of 10/08/18 10:10 AM. Always use your most recent med list.          ACCU-CHEK COMPACT PLUS test strip Generic drug:  glucose blood Use to check blood sugar once a day dx code E11.9   ACCU-CHEK SOFTCLIX LANCETS lancets Use to check blood sugar once a day dx code E11.9   amitriptyline 25 MG tablet Commonly known as:  ELAVIL Take 25 mg by mouth at bedtime.   ezetimibe 10 MG tablet Commonly known as:  ZETIA Take 10 mg by mouth daily.   FERREX 150 150 MG capsule Generic drug:  iron polysaccharides TAKE 1 CAPSULE BY MOUTH EVERY DAY   folic acid 0.5 MG tablet Commonly known as:  FOLVITE Take 0.5 mg by mouth daily.   gabapentin 300 MG capsule Commonly known as:  NEURONTIN Take 300 mg by mouth daily.   Ginger 500 MG Caps Take 1 capsule by mouth daily.   hydrochlorothiazide 12.5 MG capsule Commonly known as:  MICROZIDE TAKE 1 CAPSULE BY MOUTH ONCE DAILY   KLOR-CON M20 20 MEQ tablet Generic drug:  potassium chloride SA TAKE 1 TABLET BY MOUTH DAILY.   labetalol 100 MG tablet Commonly known as:  NORMODYNE TAKE 1 TABLET BY MOUTH TWICE A DAY   metFORMIN 1000 MG tablet Commonly known as:  GLUCOPHAGE TAKE 1 TABLET (1,000 MG TOTAL) BY MOUTH 2 (TWO) TIMES DAILY.   methotrexate 2.5 MG tablet Commonly known as:  RHEUMATREX Take 15 mg by mouth once a week. Patient takes 3 tablets twice once a week to add up to be 6 tablets once weekly, starts with 2 tablets in the evening and takes 2 more tablets in the morning   montelukast 10 MG tablet Commonly known as:  SINGULAIR Take 10 mg by mouth daily.   NONFORMULARY OR  COMPOUNDED ITEM [facility] compound:  Peripheral Neuropathy cream - Bu[ovacaome 1%, Doxepin 3%, Gabapentin 6%, Pentoxifylline 3%, Topiramate 1%, dispense 120grams, apply 1-2 grams to affected area 3-4 times daily, + 3 refills.   NUCYNTA 50 MG tablet Generic drug:  tapentadol Take 50 mg by mouth as needed for severe pain (spasms).   pravastatin 80 MG tablet Commonly known as:  PRAVACHOL TAKE 1 TABLET BY MOUTH DAILY  spironolactone 100 MG tablet Commonly known as:  ALDACTONE TAKE 1 TABLET (100 MG TOTAL) BY MOUTH DAILY.   Turmeric 450 MG Caps Take 1 capsule by mouth daily.   vitamin B-12 1000 MCG tablet Commonly known as:  CYANOCOBALAMIN Take 1,000 mcg by mouth daily.       Allergies:  Allergies  Allergen Reactions  . Acetaminophen Nausea Only  . Actos [Pioglitazone Hydrochloride]     edema  . Aspirin Nausea Only  . Atorvastatin Other (See Comments)    Felt drunk/high floating  . Morphine Sulfate Other (See Comments)    Burns injecting "arm is on fire"  . Naproxen Sodium Nausea Only  . Sitagliptin Phosphate Nausea And Vomiting    Past Medical History:  Diagnosis Date  . Allergic rhinitis   . Diabetes mellitus    TYPE 2  . Dyslipidemia   . Hyperkalemia   . Hypertension   . Insomnia    Pt stated no trouble falling or staying asleep.  . RA (rheumatoid arthritis) (HCC)     Past Surgical History:  Procedure Laterality Date  . BREAST CYST EXCISION Right 1967  . HAND TENDON SURGERY     Right  . OVARIAN CYST SURGERY Right 1988    Family History  Problem Relation Age of Onset  . Diabetes Mother   . Diabetes Father   . Cancer Father        Prostate  . Diabetes Sister   . Stroke Maternal Grandmother        Cause of death  . Healthy Brother     Social History:  reports that she has never smoked. She has never used smokeless tobacco. She reports that she does not drink alcohol or use drugs.  Review of Systems:   History of rheumatoid arthritis on   Methotrexate   Vitamin D deficiency: Currently taking vitamin D3, 1000 U daily  LIPIDS: Taking Zetia from PCP and is on pravastatin 80 mg  No history of liver dysfunction  Lab Results  Component Value Date   CHOL 149 06/25/2018   HDL 66.60 06/25/2018   LDLCALC 73 06/25/2018   TRIG 45.0 06/25/2018   CHOLHDL 2 06/25/2018   Lab Results  Component Value Date   ALT 19 10/04/2018   ALT 23 09/11/2017      Examination:   Ht 5\' 4"  (1.626 m)   Wt 169 lb (76.7 kg)   BMI 29.01 kg/m   Body mass index is 29.01 kg/m.      Assesment/PLAN:  Hypertension:  Blood pressure is very well controlled   She is on a 3 drug regimen of  Aldactone, labetalol and  HCTZ   RENAL dysfunction: Her creatinine is slightly higher than usual at 1.35 compared to 1.03 Recommendations:   Hypokalemia: She has a consistently normal potassium level with 100 mg Aldactone and Supplementing with only 1 tablet of potassium  Likely has renal potassium wasting and not hyperaldosteronism   DIABETES: Well controlled and A1c is consistently in the upper normal range with metformin alone A1c 6.1 and home blood sugars are fairly good also  She is trying to watch her diet and weight has been stable She does try to do a little yard work and other activities but limited in her ability to walk She can continue to check her sugars about 2 or 3 times a week but more readings after meals  HYPERCHOLESTEROLEMIA: LDL has come down below 100 now without any change in medications and she will continue  She will follow-up in 4 months   There are no Patient Instructions on file for this visit.   Reather Littler 10/08/2018, 10:10 AM             Patient ID: April [name], female   DOB: 11-Jan-1941, 77 y.o.   MRN: [id number]   Reason for Appointment: follow-up of various problems  History of Present Illness    HYPERTENSION:  diagnosed around 1979 with symptoms of headaches Previously she was taking Avapro,  clonidine 0.6 at bedtime and Dyazide but blood pressure was relatively high with this regimen  For evaluation of her hyperaldosteronism she was switched to labetalol 100 mg twice a day, Tenex 2 mg and doxazosin With this regimen her blood pressure has been much better She was subsequently started on Aldactone to help with her severe hypokalemia  Her treatment regimen has been continued unchanged along with 12.5 mg hydrochlorothiazide  Blood pressure has been consistently controlled   She has checked her blood pressure at the drug store occasionally with usually normal readings  BP Readings from Last 3 Encounters:  10/08/18 124/74  07/02/18 94/62  06/28/18 122/70     HYPOKALEMIA:  This previously had been a long-standing At baseline had  been prescribed 6 tablets of potassium daily She was off her diuretics and Avapro when her evaluation for hyperaldosteronism was done, aldosterone level was 6.0 along with a relatively low renin level  She has a normal potassium level recently again with Aldactone 100 mg daily along with 1 tablet of potassium 20 mEq daily    Lab Results  Component Value Date   CREATININE 1.30 (H) 10/04/2018   BUN 24 (H) 10/04/2018   NA 137 10/04/2018   K 4.5 10/04/2018   CL 103 10/04/2018   CO2 27 10/04/2018    DIABETES type II:   She has had diabetes since at least 2015 This has been  well controlled with a regimen of metformin 1 g twice a day only  A1c has been consistently upper normal, now  6.1% She is not able to exercise because of musculoskeletal problems, does a little yard work Usually tries to watch her portions and sweets  She checks her blood sugars only occasionally and mostly in the mornings  Recent fasting readings on her Accu-Chek download are between 77 up to 126, nonfasting 108 Overall AVERAGE 108   Her weight has been about the same    Wt Readings from Last 3 Encounters:  10/08/18 169 lb (76.7 kg)  07/02/18 165 lb 6.4 oz (75  kg)  06/28/18 165 lb 9.6 oz (75.1 kg)      Lab Results  Component Value Date   HGBA1C 6.1 10/04/2018   HGBA1C 6.1 06/25/2018   HGBA1C 5.7 02/19/2018   Lab Results  Component Value Date   MICROALBUR 2.8 (H) 02/19/2018   LDLCALC 73 06/25/2018   CREATININE 1.30 (H) 10/04/2018    OTHER active problems discussed today are in review of systems  LABS:  Lab on 10/04/2018  Component Date Value Ref Range Status  . Sodium 10/04/2018 137  135 - 145 mEq/L Final  . Potassium 10/04/2018 4.5  3.5 - 5.1 mEq/L Final  . Chloride 10/04/2018 103  96 - 112 mEq/L Final  . CO2 10/04/2018 27  19 - 32 mEq/L Final  . Glucose, Bld 10/04/2018 89  70 - 99 mg/dL Final  . BUN 82/95/6213 24* 6 - 23 mg/dL Final  . Creatinine, Ser 10/04/2018 1.30* 0.40 - 1.20 mg/dL  Final  . Total Bilirubin 10/04/2018 0.4  0.2 - 1.2 mg/dL Final  . Alkaline Phosphatase 10/04/2018 97  39 - 117 U/L Final  . AST 10/04/2018 19  0 - 37 U/L Final  . ALT 10/04/2018 19  0 - 35 U/L Final  . Total Protein 10/04/2018 7.4  6.0 - 8.3 g/dL Final  . Albumin 12/75/1700 4.5  3.5 - 5.2 g/dL Final  . Calcium 17/49/4496 9.8  8.4 - 10.5 mg/dL Final  . GFR 75/91/6384 51.31* >60.00 mL/min Final  . Hgb A1c MFr Bld 10/04/2018 6.1  4.6 - 6.5 % Final   Glycemic Control Guidelines for People with Diabetes:Non Diabetic:  <6%Goal of Therapy: <7%Additional Action Suggested:  >8%     Allergies as of 10/08/2018      Reactions   Acetaminophen Nausea Only   Actos [pioglitazone Hydrochloride]    edema   Aspirin Nausea Only   Atorvastatin Other (See Comments)   Felt drunk/high floating   Morphine Sulfate Other (See Comments)   Burns injecting "arm is on fire"   Naproxen Sodium Nausea Only   Sitagliptin Phosphate Nausea And Vomiting      Medication List        Accurate as of 10/08/18  1:02 PM. Always use your most recent med list.          ACCU-CHEK COMPACT PLUS test strip Generic drug:  glucose blood Use to check blood sugar once a day dx  code E11.9   ACCU-CHEK SOFTCLIX LANCETS lancets Use to check blood sugar once a day dx code E11.9   amitriptyline 25 MG tablet Commonly known as:  ELAVIL Take 25 mg by mouth at bedtime.   ezetimibe 10 MG tablet Commonly known as:  ZETIA Take 10 mg by mouth daily.   FERREX 150 150 MG capsule Generic drug:  iron polysaccharides TAKE 1 CAPSULE BY MOUTH EVERY DAY   folic acid 0.5 MG tablet Commonly known as:  FOLVITE Take 0.5 mg by mouth daily.   gabapentin 300 MG capsule Commonly known as:  NEURONTIN Take 300 mg by mouth daily.   Ginger 500 MG Caps Take 1 capsule by mouth daily.   hydrochlorothiazide 12.5 MG capsule Commonly known as:  MICROZIDE TAKE 1 CAPSULE BY MOUTH ONCE DAILY   KLOR-CON M20 20 MEQ tablet Generic drug:  potassium chloride SA TAKE 1 TABLET BY MOUTH DAILY.   labetalol 100 MG tablet Commonly known as:  NORMODYNE TAKE 1 TABLET BY MOUTH TWICE A DAY   metFORMIN 1000 MG tablet Commonly known as:  GLUCOPHAGE TAKE 1 TABLET (1,000 MG TOTAL) BY MOUTH 2 (TWO) TIMES DAILY.   methotrexate 2.5 MG tablet Commonly known as:  RHEUMATREX Take 15 mg by mouth once a week. Patient takes 3 tablets twice once a week to add up to be 6 tablets once weekly, starts with 2 tablets in the evening and takes 2 more tablets in the morning   montelukast 10 MG tablet Commonly known as:  SINGULAIR Take 10 mg by mouth daily.   NONFORMULARY OR COMPOUNDED ITEM [facility] compound:  Peripheral Neuropathy cream - Bu[ovacaome 1%, Doxepin 3%, Gabapentin 6%, Pentoxifylline 3%, Topiramate 1%, dispense 120grams, apply 1-2 grams to affected area 3-4 times daily, + 3 refills.   NUCYNTA 50 MG tablet Generic drug:  tapentadol Take 50 mg by mouth as needed for severe pain (spasms).   pravastatin 80 MG tablet Commonly known as:  PRAVACHOL TAKE 1 TABLET BY MOUTH DAILY   spironolactone 100 MG tablet  Commonly known as:  ALDACTONE TAKE 1 TABLET (100 MG TOTAL) BY MOUTH DAILY.     Turmeric 450 MG Caps Take 1 capsule by mouth daily.   vitamin B-12 1000 MCG tablet Commonly known as:  CYANOCOBALAMIN Take 1,000 mcg by mouth daily.       Allergies:  Allergies  Allergen Reactions  . Acetaminophen Nausea Only  . Actos [Pioglitazone Hydrochloride]     edema  . Aspirin Nausea Only  . Atorvastatin Other (See Comments)    Felt drunk/high floating  . Morphine Sulfate Other (See Comments)    Burns injecting "arm is on fire"  . Naproxen Sodium Nausea Only  . Sitagliptin Phosphate Nausea And Vomiting    Past Medical History:  Diagnosis Date  . Allergic rhinitis   . Diabetes mellitus    TYPE 2  . Dyslipidemia   . Hyperkalemia   . Hypertension   . Insomnia    Pt stated no trouble falling or staying asleep.  . RA (rheumatoid arthritis) (HCC)     Past Surgical History:  Procedure Laterality Date  . BREAST CYST EXCISION Right 1967  . HAND TENDON SURGERY     Right  . OVARIAN CYST SURGERY Right 1988    Family History  Problem Relation Age of Onset  . Diabetes Mother   . Diabetes Father   . Cancer Father        Prostate  . Diabetes Sister   . Stroke Maternal Grandmother        Cause of death  . Healthy Brother     Social History:  reports that she has never smoked. She has never used smokeless tobacco. She reports that she does not drink alcohol or use drugs.  Review of Systems:   History of rheumatoid arthritis on  Methotrexate   Vitamin D deficiency: Currently taking vitamin D3, 1000 U daily  LIPIDS: Taking Zetia from PCP and is on pravastatin 80 mg  Lab Results  Component Value Date   CHOL 149 06/25/2018   HDL 66.60 06/25/2018   LDLCALC 73 06/25/2018   TRIG 45.0 06/25/2018   CHOLHDL 2 06/25/2018   Lab Results  Component Value Date   ALT 19 10/04/2018   ALT 23 09/11/2017   Occasionally will have a feeling of numbness in the right anterior lower leg only, no sharp pains or numbness in the toes and no symptoms on the left     Examination:   BP 124/74 (Patient Position: Standing)   Pulse 72   Ht 5\' 4"  (1.626 m)   Wt 169 lb (76.7 kg)   SpO2 95%   BMI 29.01 kg/m   Body mass index is 29.01 kg/m.      Assesment/PLAN:  Hypertension:   Blood pressure is well controlled even with stopping her doxazosin No orthostasis She is on a 3 drug regimen of  Aldactone, labetalol and  HCTZ   RENAL dysfunction: Her creatinine is slightly higher at 1.3 and not clear why it is not improved further  Hypokalemia: She has a consistently normal potassium level with 100 mg Aldactone and Supplementing with only 1 tablet of potassium, this is likely from renal potassium wasting She can try taking a half a tablet daily of the potassium   DIABETES: Well controlled with metformin only  A1c 6.1 and home blood sugars are fairly good also  Weight has gone up slightly higher and she will need to watch her diet consistently  Intermittent right leg numbness: She will follow-up with  PCP    She will follow-up in 4 months   There are no Patient Instructions on file for this visit.   Reather Littler 10/08/2018, 1:02 PM

## 2018-10-10 ENCOUNTER — Ambulatory Visit: Payer: PPO | Admitting: Endocrinology

## 2018-10-10 DIAGNOSIS — M8580 Other specified disorders of bone density and structure, unspecified site: Secondary | ICD-10-CM | POA: Diagnosis not present

## 2018-10-10 DIAGNOSIS — Z79899 Other long term (current) drug therapy: Secondary | ICD-10-CM | POA: Diagnosis not present

## 2018-10-10 DIAGNOSIS — E119 Type 2 diabetes mellitus without complications: Secondary | ICD-10-CM | POA: Diagnosis not present

## 2018-10-10 DIAGNOSIS — M859 Disorder of bone density and structure, unspecified: Secondary | ICD-10-CM | POA: Diagnosis not present

## 2018-10-10 DIAGNOSIS — N289 Disorder of kidney and ureter, unspecified: Secondary | ICD-10-CM | POA: Diagnosis not present

## 2018-10-10 DIAGNOSIS — M150 Primary generalized (osteo)arthritis: Secondary | ICD-10-CM | POA: Diagnosis not present

## 2018-10-10 DIAGNOSIS — M0589 Other rheumatoid arthritis with rheumatoid factor of multiple sites: Secondary | ICD-10-CM | POA: Diagnosis not present

## 2018-10-10 DIAGNOSIS — M549 Dorsalgia, unspecified: Secondary | ICD-10-CM | POA: Diagnosis not present

## 2018-10-15 ENCOUNTER — Other Ambulatory Visit: Payer: Self-pay | Admitting: Endocrinology

## 2018-10-31 ENCOUNTER — Other Ambulatory Visit: Payer: Self-pay | Admitting: Endocrinology

## 2018-10-31 ENCOUNTER — Other Ambulatory Visit: Payer: Self-pay | Admitting: Cardiovascular Disease

## 2018-12-13 ENCOUNTER — Other Ambulatory Visit: Payer: Self-pay | Admitting: Endocrinology

## 2018-12-14 ENCOUNTER — Inpatient Hospital Stay: Payer: No Typology Code available for payment source | Attending: Hematology

## 2018-12-14 ENCOUNTER — Inpatient Hospital Stay: Payer: No Typology Code available for payment source | Admitting: Hematology

## 2018-12-14 ENCOUNTER — Telehealth: Payer: Self-pay | Admitting: Hematology

## 2018-12-14 NOTE — Telephone Encounter (Signed)
Called patient per 1/10 sch message - unable to reach patient - left message with call back number for patient to call back to r/s

## 2018-12-17 ENCOUNTER — Telehealth: Payer: Self-pay | Admitting: *Deleted

## 2018-12-17 NOTE — Telephone Encounter (Signed)
Patient left call with Nurse Line and on provider voice mail stating she had received a call from the cancer center about a missed appt. Attempted to contact patient: left message on named voice mail: Appt missed was made in July for 6 month follow uo and check up with Dr. Candise Che r/t her anemia. If she is not being followed by someone else for this condition, please contact scheduling to make appt.

## 2018-12-18 ENCOUNTER — Telehealth: Payer: Self-pay | Admitting: Hematology

## 2018-12-18 ENCOUNTER — Telehealth: Payer: Self-pay | Admitting: *Deleted

## 2018-12-18 NOTE — Telephone Encounter (Signed)
Left message re 2/5 lab/fu. Schedule mailed.

## 2018-12-18 NOTE — Telephone Encounter (Signed)
Received voice mail from patient stating she does want to come see Dr. Candise Che. Attempted top contact patient - reached named voice mail. Left patient message that scheduling will contact her to make appt.

## 2018-12-25 ENCOUNTER — Other Ambulatory Visit: Payer: Self-pay | Admitting: Hematology

## 2018-12-27 DIAGNOSIS — E119 Type 2 diabetes mellitus without complications: Secondary | ICD-10-CM | POA: Diagnosis not present

## 2018-12-27 LAB — HM DIABETES EYE EXAM

## 2019-01-09 ENCOUNTER — Other Ambulatory Visit: Payer: Self-pay

## 2019-01-09 ENCOUNTER — Ambulatory Visit: Payer: Self-pay | Admitting: Hematology
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
## 2019-01-14 ENCOUNTER — Inpatient Hospital Stay: Payer: HMO | Attending: Hematology

## 2019-01-14 ENCOUNTER — Telehealth: Payer: Self-pay | Admitting: Hematology

## 2019-01-14 ENCOUNTER — Inpatient Hospital Stay (HOSPITAL_BASED_OUTPATIENT_CLINIC_OR_DEPARTMENT_OTHER): Payer: HMO | Admitting: Hematology

## 2019-01-14 VITALS — BP 143/79 | HR 72 | Temp 97.9°F | Resp 18 | Ht 64.0 in | Wt 162.7 lb

## 2019-01-14 DIAGNOSIS — M069 Rheumatoid arthritis, unspecified: Secondary | ICD-10-CM | POA: Insufficient documentation

## 2019-01-14 DIAGNOSIS — D638 Anemia in other chronic diseases classified elsewhere: Secondary | ICD-10-CM

## 2019-01-14 DIAGNOSIS — D508 Other iron deficiency anemias: Secondary | ICD-10-CM

## 2019-01-14 DIAGNOSIS — D509 Iron deficiency anemia, unspecified: Secondary | ICD-10-CM

## 2019-01-14 LAB — CMP (CANCER CENTER ONLY)
ALT: 14 U/L (ref 0–44)
AST: 19 U/L (ref 15–41)
Albumin: 4.1 g/dL (ref 3.5–5.0)
Alkaline Phosphatase: 103 U/L (ref 38–126)
Anion gap: 10 (ref 5–15)
BUN: 16 mg/dL (ref 8–23)
CO2: 26 mmol/L (ref 22–32)
Calcium: 9.7 mg/dL (ref 8.9–10.3)
Chloride: 106 mmol/L (ref 98–111)
Creatinine: 1.02 mg/dL — ABNORMAL HIGH (ref 0.44–1.00)
GFR, Est AFR Am: >60 mL/min (ref >60)
GFR, Estimated: 54 mL/min — ABNORMAL LOW (ref >60)
Glucose, Bld: 93 mg/dL (ref 70–99)
Potassium: 3.5 mmol/L (ref 3.5–5.1)
Sodium: 142 mmol/L (ref 135–145)
Total Bilirubin: 0.9 mg/dL (ref 0.3–1.2)
Total Protein: 7.5 g/dL (ref 6.5–8.1)

## 2019-01-14 LAB — CBC WITH DIFFERENTIAL/PLATELET
Abs Immature Granulocytes: 0.02 10*3/uL (ref 0.00–0.07)
Basophils Absolute: 0 10*3/uL (ref 0.0–0.1)
Basophils Relative: 0 %
Eosinophils Absolute: 0.1 10*3/uL (ref 0.0–0.5)
Eosinophils Relative: 3 %
HCT: 34.5 % — ABNORMAL LOW (ref 36.0–46.0)
Hemoglobin: 10.9 g/dL — ABNORMAL LOW (ref 12.0–15.0)
Immature Granulocytes: 0 %
Lymphocytes Relative: 23 %
Lymphs Abs: 1.2 10*3/uL (ref 0.7–4.0)
MCH: 28.3 pg (ref 26.0–34.0)
MCHC: 31.6 g/dL (ref 30.0–36.0)
MCV: 89.6 fL (ref 80.0–100.0)
Monocytes Absolute: 0.6 10*3/uL (ref 0.1–1.0)
Monocytes Relative: 11 %
Neutro Abs: 3.3 10*3/uL (ref 1.7–7.7)
Neutrophils Relative %: 63 %
Platelets: 281 10*3/uL (ref 150–400)
RBC: 3.85 MIL/uL — ABNORMAL LOW (ref 3.87–5.11)
RDW: 15.1 % (ref 11.5–15.5)
WBC: 5.3 10*3/uL (ref 4.0–10.5)
nRBC: 0 % (ref 0.0–0.2)

## 2019-01-14 LAB — FERRITIN: Ferritin: 78 ng/mL (ref 11–307)

## 2019-01-14 LAB — IRON AND TIBC
Iron: 51 ug/dL (ref 41–142)
Saturation Ratios: 14 % — ABNORMAL LOW (ref 21–57)
TIBC: 353 ug/dL (ref 236–444)
UIBC: 302 ug/dL (ref 120–384)

## 2019-01-14 NOTE — Telephone Encounter (Signed)
Scheduled appt per 2/10 los. ° °Printed calendar and avs. °

## 2019-01-16 DIAGNOSIS — M25552 Pain in left hip: Secondary | ICD-10-CM | POA: Diagnosis not present

## 2019-01-16 DIAGNOSIS — M150 Primary generalized (osteo)arthritis: Secondary | ICD-10-CM | POA: Diagnosis not present

## 2019-01-16 DIAGNOSIS — Z79899 Other long term (current) drug therapy: Secondary | ICD-10-CM | POA: Diagnosis not present

## 2019-01-16 DIAGNOSIS — N289 Disorder of kidney and ureter, unspecified: Secondary | ICD-10-CM | POA: Diagnosis not present

## 2019-01-16 DIAGNOSIS — M549 Dorsalgia, unspecified: Secondary | ICD-10-CM | POA: Diagnosis not present

## 2019-01-16 DIAGNOSIS — M0589 Other rheumatoid arthritis with rheumatoid factor of multiple sites: Secondary | ICD-10-CM | POA: Diagnosis not present

## 2019-01-16 DIAGNOSIS — M8580 Other specified disorders of bone density and structure, unspecified site: Secondary | ICD-10-CM | POA: Diagnosis not present

## 2019-01-16 DIAGNOSIS — E119 Type 2 diabetes mellitus without complications: Secondary | ICD-10-CM | POA: Diagnosis not present

## 2019-01-16 DIAGNOSIS — M160 Bilateral primary osteoarthritis of hip: Secondary | ICD-10-CM | POA: Diagnosis not present

## 2019-01-20 DIAGNOSIS — D509 Iron deficiency anemia, unspecified: Secondary | ICD-10-CM | POA: Insufficient documentation

## 2019-01-23 ENCOUNTER — Other Ambulatory Visit: Payer: Self-pay | Admitting: Hematology

## 2019-01-23 ENCOUNTER — Telehealth: Payer: Self-pay | Admitting: *Deleted

## 2019-01-23 MED ORDER — POLYSACCHARIDE IRON COMPLEX 150 MG PO CAPS: 150.0000 mg | ORAL_CAPSULE | Freq: Two times a day (BID) | ORAL | 3 refills | Status: DC

## 2019-01-23 NOTE — Telephone Encounter (Signed)
Contacted patient per Dr. Clyda Greener directions to inform patient that ferritin is 78, lower than goal of <100. He has ordered and would recommend IV Injectafer x 1 dose if patient agreeable. Patient would prefer not to have IV or injectable if at all possible. Wants to know if she can increase Ferrex - currently 150 mg/day - instead. Advised her that Dr. Candise Che will be given her request and she will be contacted with his response. Patient verbalized understanding.

## 2019-01-24 ENCOUNTER — Telehealth: Payer: Self-pay | Admitting: *Deleted

## 2019-01-24 DIAGNOSIS — M25552 Pain in left hip: Secondary | ICD-10-CM | POA: Diagnosis not present

## 2019-01-24 DIAGNOSIS — M25521 Pain in right elbow: Secondary | ICD-10-CM | POA: Diagnosis not present

## 2019-01-24 NOTE — Telephone Encounter (Signed)
Contacted patient with Dr.Kale's response to question: Can she increase Ferrex - currently 150 mg/day - instead of receiving IV iron? Per Dr. Candise Che, patient can take Ferrex 150 mg BID if tolerated instead of receiving IV iron. He sent in prescription for that amount. Patient verbalized understanding of new prescription. Dr. Candise Che recommends f/u with her PCP in next 2-3 months to check ferritin level. Per Dr. Candise Che, patient can return to him for follow up in 12 months or see PCP regularly. Patient verbalized understanding. Encouraged to contact office for questions of concerns.

## 2019-02-05 ENCOUNTER — Other Ambulatory Visit: Payer: Self-pay

## 2019-02-05 NOTE — Patient Outreach (Signed)
  [facility]) Care Management Chronic Special Needs Program  02/05/2019  Name: April [name] DOB: 1941/11/12  MRN: [id number]  April [name] is enrolled in a Chronic Special Needs Plan. No Health Risk assessment. Individualized care plan completed based on available chart information.  Plan: send copy of care plan to primary care. Send copy of care plan to client. Care Coordinator will attempt outreach in 4-6 months.   Kathyrn Sheriff, RN, MSN, [facility] Chronic Care Management Coordinator [facility] [phone]

## 2019-02-06 DIAGNOSIS — G894 Chronic pain syndrome: Secondary | ICD-10-CM | POA: Diagnosis not present

## 2019-02-07 ENCOUNTER — Other Ambulatory Visit: Payer: Self-pay

## 2019-02-07 ENCOUNTER — Other Ambulatory Visit (INDEPENDENT_AMBULATORY_CARE_PROVIDER_SITE_OTHER): Payer: Self-pay

## 2019-02-07 DIAGNOSIS — E119 Type 2 diabetes mellitus without complications: Secondary | ICD-10-CM

## 2019-02-07 LAB — COMPREHENSIVE METABOLIC PANEL
ALT: 17 U/L (ref 0–35)
AST: 18 U/L (ref 0–37)
Albumin: 4.2 g/dL (ref 3.5–5.2)
Alkaline Phosphatase: 101 U/L (ref 39–117)
BUN: 16 mg/dL (ref 6–23)
CO2: 28 mEq/L (ref 19–32)
Calcium: 9.5 mg/dL (ref 8.4–10.5)
Chloride: 104 mEq/L (ref 96–112)
Creatinine, Ser: 1.13 mg/dL (ref 0.40–1.20)
GFR: 56.69 mL/min — ABNORMAL LOW (ref >60.00)
Glucose, Bld: 86 mg/dL (ref 70–99)
Potassium: 3.8 mEq/L (ref 3.5–5.1)
Sodium: 139 mEq/L (ref 135–145)
Total Bilirubin: 0.5 mg/dL (ref 0.2–1.2)
Total Protein: 7.1 g/dL (ref 6.0–8.3)

## 2019-02-07 LAB — MICROALBUMIN / CREATININE URINE RATIO
Creatinine,U: 123.8 mg/dL
Microalb Creat Ratio: 1.6 mg/g (ref 0.0–30.0)
Microalb, Ur: 2 mg/dL — ABNORMAL HIGH (ref 0.0–1.9)

## 2019-02-07 LAB — HEMOGLOBIN A1C: Hgb A1c MFr Bld: 5.9 % (ref 4.6–6.5)

## 2019-02-11 ENCOUNTER — Ambulatory Visit (INDEPENDENT_AMBULATORY_CARE_PROVIDER_SITE_OTHER): Payer: Self-pay | Admitting: Endocrinology

## 2019-02-11 ENCOUNTER — Encounter: Payer: Self-pay | Admitting: Endocrinology

## 2019-02-11 ENCOUNTER — Other Ambulatory Visit: Payer: Self-pay

## 2019-02-11 VITALS — BP 152/88 | HR 88 | Ht 64.0 in | Wt 161.0 lb

## 2019-02-11 DIAGNOSIS — I10 Essential (primary) hypertension: Secondary | ICD-10-CM

## 2019-02-11 DIAGNOSIS — E119 Type 2 diabetes mellitus without complications: Secondary | ICD-10-CM

## 2019-02-11 NOTE — Progress Notes (Signed)
Patient ID: April [name], female   DOB: 25-Dec-1940, 78 y.o.   MRN: [id number]   Reason for Appointment: follow-up of various problems  History of Present Illness    HYPERTENSION:  diagnosed around 1979 with symptoms of headaches Previously she was taking Avapro, clonidine 0.6 at bedtime and Dyazide but blood pressure was relatively high with this regimen  For evaluation of her hyperaldosteronism she was switched to labetalol 100 mg twice a day, Tenex 2 mg and doxazosin With this regimen her blood pressure has been much better She was subsequently started on Aldactone to help with her severe hypokalemia  Her treatment regimen has been continued except her doxazosin was stopped in July by cardiologist when her blood pressure was relatively low  She is still taking Aldactone and labetalol along with 12.5 mg hydrochlorothiazide  She is not taking her medications over the last 3 days was having vomiting from gastroenteritis and has not gone back to taking them   She has checked her blood pressure at the drug store occasionally: Readings 120s  BP Readings from Last 3 Encounters:  02/11/19 (!) 152/88  01/14/19 (!) 143/79  10/08/18 124/74     HYPOKALEMIA:   This previously had been a chronic problem and associated with hypertension  Previously had  been prescribed 6 tablets of potassium daily She was off her diuretics and Avapro when her evaluation for hyperaldosteronism was done, aldosterone level was 6.0 along with a relatively low renin level  She has a normal potassium level recently with Aldactone 100 mg daily along with 1 tablet of potassium 20 mEq daily  Lab Results  Component Value Date   K 3.8 02/07/2019     DIABETES type II:   She has had diabetes since at least 2015 This has been  well controlled with a regimen of metformin 1 g twice a day monotherapy  A1c has been consistently upper normal, now 5.9 compared to 6.1%  She has lost weight  although last week and had gastroenteritis She is trying to walk up to 45 minutes May have also improved her diet  She checks her blood sugars only fasting and rarely in the afternoon or early morning  Most recent fasting GLUCOSE readings on her Accu-Chek download: Range 78-121  Overall AVERAGE 95   Weight history:   Wt Readings from Last 3 Encounters:  02/11/19 161 lb (73 kg)  01/14/19 162 lb 11.2 oz (73.8 kg)  10/08/18 169 lb (76.7 kg)      Lab Results  Component Value Date   HGBA1C 5.9 02/07/2019   HGBA1C 6.1 10/04/2018   HGBA1C 6.1 06/25/2018   Lab Results  Component Value Date   MICROALBUR 2.0 (H) 02/07/2019   LDLCALC 73 06/25/2018   CREATININE 1.13 02/07/2019    OTHER active problems discussed today are in review of systems  LABS:  Lab on 02/07/2019  Component Date Value Ref Range Status  . Microalb, Ur 02/07/2019 2.0* 0.0 - 1.9 mg/dL Final  . Creatinine,U 40/98/119103/04/2019 123.8  mg/dL Final  . Microalb Creat Ratio 02/07/2019 1.6  0.0 - 30.0 mg/g Final  . Sodium 02/07/2019 139  135 - 145 mEq/L Final  . Potassium 02/07/2019 3.8  3.5 - 5.1 mEq/L Final  . Chloride 02/07/2019 104  96 - 112 mEq/L Final  . CO2 02/07/2019 28  19 - 32 mEq/L Final  . Glucose, Bld 02/07/2019 86  70 - 99 mg/dL Final  . BUN 47/82/956203/04/2019 16  6 - 23  mg/dL Final  . Creatinine, Ser 02/07/2019 1.13  0.40 - 1.20 mg/dL Final  . Total Bilirubin 02/07/2019 0.5  0.2 - 1.2 mg/dL Final  . Alkaline Phosphatase 02/07/2019 101  39 - 117 U/L Final  . AST 02/07/2019 18  0 - 37 U/L Final  . ALT 02/07/2019 17  0 - 35 U/L Final  . Total Protein 02/07/2019 7.1  6.0 - 8.3 g/dL Final  . Albumin 16/09/9603 4.2  3.5 - 5.2 g/dL Final  . Calcium 54/08/8118 9.5  8.4 - 10.5 mg/dL Final  . GFR 14/78/2956 56.69* >60.00 mL/min Final  . Hgb A1c MFr Bld 02/07/2019 5.9  4.6 - 6.5 % Final   Glycemic Control Guidelines for People with Diabetes:Non Diabetic:  <6%Goal of Therapy: <7%Additional Action Suggested:  >8%      Allergies as of 02/11/2019      Reactions   Acetaminophen Nausea Only   Actos [pioglitazone Hydrochloride]    edema   Aspirin Nausea Only   Atorvastatin Other (See Comments)   Felt drunk/high floating   Morphine Sulfate Other (See Comments)   Burns injecting "arm is on fire"   Naproxen Sodium Nausea Only   Sitagliptin Phosphate Nausea And Vomiting      Medication List       Accurate as of February 11, 2019  9:51 AM. Always use your most recent med list.        Accu-Chek Compact Plus test strip Generic drug:  glucose blood Use to check blood sugar once a day dx code E11.9   Accu-Chek Softclix Lancets lancets Use to check blood sugar once a day dx code E11.9   amitriptyline 25 MG tablet Commonly known as:  ELAVIL Take 25 mg by mouth at bedtime.   ezetimibe 10 MG tablet Commonly known as:  ZETIA Take 10 mg by mouth daily.   folic acid 0.5 MG tablet Commonly known as:  FOLVITE Take 0.5 mg by mouth daily.   gabapentin 300 MG capsule Commonly known as:  NEURONTIN Take 300 mg by mouth daily.   Ginger 500 MG Caps Take 1 capsule by mouth daily.   hydrochlorothiazide 12.5 MG capsule Commonly known as:  MICROZIDE TAKE 1 CAPSULE BY MOUTH ONCE DAILY   iron polysaccharides 150 MG capsule Commonly known as:  Ferrex 150 Take 1 capsule (150 mg total) by mouth 2 (two) times daily.   Klor-Con M20 20 MEQ tablet Generic drug:  potassium chloride SA TAKE 1 TABLET BY MOUTH DAILY.   labetalol 100 MG tablet Commonly known as:  NORMODYNE TAKE 1 TABLET BY MOUTH TWICE A DAY   metFORMIN 1000 MG tablet Commonly known as:  GLUCOPHAGE TAKE 1 TABLET (1,000 MG TOTAL) BY MOUTH 2 (TWO) TIMES DAILY.   methotrexate 2.5 MG tablet Commonly known as:  RHEUMATREX Take 15 mg by mouth once a week. Patient takes 3 tablets twice once a week to add up to be 6 tablets once weekly, starts with 2 tablets in the evening and takes 2 more tablets in the morning   montelukast 10 MG tablet Commonly  known as:  SINGULAIR Take 10 mg by mouth daily.   NONFORMULARY OR COMPOUNDED ITEM [facility] compound:  Peripheral Neuropathy cream - Bu[ovacaome 1%, Doxepin 3%, Gabapentin 6%, Pentoxifylline 3%, Topiramate 1%, dispense 120grams, apply 1-2 grams to affected area 3-4 times daily, + 3 refills.   Nucynta 50 MG tablet Generic drug:  tapentadol Take 50 mg by mouth as needed for severe pain (spasms).   pravastatin 80  MG tablet Commonly known as:  PRAVACHOL TAKE 1 TABLET BY MOUTH DAILY   spironolactone 100 MG tablet Commonly known as:  ALDACTONE TAKE 1 TABLET BY MOUTH EVERY DAY   Turmeric 450 MG Caps Take 1 capsule by mouth daily.   vitamin B-12 1000 MCG tablet Commonly known as:  CYANOCOBALAMIN Take 1,000 mcg by mouth daily.       Allergies:  Allergies  Allergen Reactions  . Acetaminophen Nausea Only  . Actos [Pioglitazone Hydrochloride]     edema  . Aspirin Nausea Only  . Atorvastatin Other (See Comments)    Felt drunk/high floating  . Morphine Sulfate Other (See Comments)    Burns injecting "arm is on fire"  . Naproxen Sodium Nausea Only  . Sitagliptin Phosphate Nausea And Vomiting    Past Medical History:  Diagnosis Date  . Allergic rhinitis   . Diabetes mellitus    TYPE 2  . Dyslipidemia   . Hyperkalemia   . Hypertension   . Insomnia    Pt stated no trouble falling or staying asleep.  . RA (rheumatoid arthritis) (HCC)     Past Surgical History:  Procedure Laterality Date  . BREAST CYST EXCISION Right 1967  . HAND TENDON SURGERY     Right  . OVARIAN CYST SURGERY Right 1988    Family History  Problem Relation Age of Onset  . Diabetes Mother   . Diabetes Father   . Cancer Father        Prostate  . Diabetes Sister   . Stroke Maternal Grandmother        Cause of death  . Healthy Brother     Social History:  reports that she has never smoked. She has never used smokeless tobacco. She reports that she does not drink alcohol or use  drugs.  Review of Systems:   History of rheumatoid arthritis on  Methotrexate   Vitamin D deficiency: Currently taking vitamin D3, 1000 U daily  LIPIDS: Taking Zetia from PCP and is on pravastatin 80 mg  No history of liver dysfunction  Lab Results  Component Value Date   CHOL 149 06/25/2018   HDL 66.60 06/25/2018   LDLCALC 73 06/25/2018   TRIG 45.0 06/25/2018   CHOLHDL 2 06/25/2018   Lab Results  Component Value Date   ALT 17 02/07/2019   ALT 14 01/14/2019   ALT 23 09/11/2017      Examination:   BP (!) 152/88 (BP Location: Left Arm, Patient Position: Sitting, Cuff Size: Normal)   Pulse 88   Ht 5\' 4"  (1.626 m)   Wt 161 lb (73 kg)   SpO2 98%   BMI 27.64 kg/m   Body mass index is 27.64 kg/m.      Assesment/PLAN:  Hypertension:  Blood pressure is very well controlled   She is on a 3 drug regimen of  Aldactone, labetalol and  HCTZ   RENAL dysfunction: Her creatinine is slightly higher than usual at 1.35 compared to 1.03 Recommendations:   Hypokalemia: She has a consistently normal potassium level with 100 mg Aldactone and Supplementing with only 1 tablet of potassium  Likely has renal potassium wasting and not hyperaldosteronism   DIABETES: Well controlled and A1c is consistently in the upper normal range with metformin alone A1c 6.1 and home blood sugars are fairly good also  She is trying to watch her diet and weight has been stable She does try to do a little yard work and other activities but limited in  her ability to walk She can continue to check her sugars about 2 or 3 times a week but more readings after meals  HYPERCHOLESTEROLEMIA: LDL has come down below 100 now without any change in medications and she will continue    She will follow-up in 4 months   There are no Patient Instructions on file for this visit.   Reather Littler 02/11/2019, 9:51 AM             Patient ID: April Lovely, female   DOB: 06/17/1941, 78 y.o.   MRN:  [id number]   Reason for Appointment: follow-up of various problems  History of Present Illness    HYPERTENSION:  diagnosed around 1979 with symptoms of headaches Previously she was taking Avapro, clonidine 0.6 at bedtime and Dyazide but blood pressure was relatively high with this regimen  For evaluation of her hyperaldosteronism she was switched to labetalol 100 mg twice a day, Tenex 2 mg and doxazosin With this regimen her blood pressure has been much better She was subsequently started on Aldactone to help with her severe hypokalemia  Her treatment regimen has been continued unchanged along with 12.5 mg hydrochlorothiazide  Blood pressure has been consistently controlled   She has checked her blood pressure at the drug store occasionally with usually normal readings  BP Readings from Last 3 Encounters:  02/11/19 (!) 152/88  01/14/19 (!) 143/79  10/08/18 124/74     HYPOKALEMIA:  This previously had been a long-standing At baseline had  been prescribed 6 tablets of potassium daily She was off her diuretics and Avapro when her evaluation for hyperaldosteronism was done, aldosterone level was 6.0 along with a relatively low renin level  She has a normal potassium level recently again with Aldactone 100 mg daily along with 1 tablet of potassium 20 mEq daily    Lab Results  Component Value Date   CREATININE 1.13 02/07/2019   BUN 16 02/07/2019   NA 139 02/07/2019   K 3.8 02/07/2019   CL 104 02/07/2019   CO2 28 02/07/2019    DIABETES type II:   She has had diabetes since at least 2015 This has been  well controlled with a regimen of metformin 1 g twice a day only  A1c has been consistently upper normal, now  6.1% She is not able to exercise because of musculoskeletal problems, does a little yard work Usually tries to watch her portions and sweets  She checks her blood sugars only occasionally and mostly in the mornings  Recent fasting readings on her Accu-Chek  download are between 77 up to 126, nonfasting 108 Overall AVERAGE 108   Her weight has been about the same    Wt Readings from Last 3 Encounters:  02/11/19 161 lb (73 kg)  01/14/19 162 lb 11.2 oz (73.8 kg)  10/08/18 169 lb (76.7 kg)      Lab Results  Component Value Date   HGBA1C 5.9 02/07/2019   HGBA1C 6.1 10/04/2018   HGBA1C 6.1 06/25/2018   Lab Results  Component Value Date   MICROALBUR 2.0 (H) 02/07/2019   LDLCALC 73 06/25/2018   CREATININE 1.13 02/07/2019    OTHER active problems discussed today are in review of systems  LABS:  Lab on 02/07/2019  Component Date Value Ref Range Status  . Microalb, Ur 02/07/2019 2.0* 0.0 - 1.9 mg/dL Final  . Creatinine,U 40/98/1191 123.8  mg/dL Final  . Microalb Creat Ratio 02/07/2019 1.6  0.0 - 30.0 mg/g Final  . Sodium  02/07/2019 139  135 - 145 mEq/L Final  . Potassium 02/07/2019 3.8  3.5 - 5.1 mEq/L Final  . Chloride 02/07/2019 104  96 - 112 mEq/L Final  . CO2 02/07/2019 28  19 - 32 mEq/L Final  . Glucose, Bld 02/07/2019 86  70 - 99 mg/dL Final  . BUN 40/98/1191 16  6 - 23 mg/dL Final  . Creatinine, Ser 02/07/2019 1.13  0.40 - 1.20 mg/dL Final  . Total Bilirubin 02/07/2019 0.5  0.2 - 1.2 mg/dL Final  . Alkaline Phosphatase 02/07/2019 101  39 - 117 U/L Final  . AST 02/07/2019 18  0 - 37 U/L Final  . ALT 02/07/2019 17  0 - 35 U/L Final  . Total Protein 02/07/2019 7.1  6.0 - 8.3 g/dL Final  . Albumin 47/82/9562 4.2  3.5 - 5.2 g/dL Final  . Calcium 13/07/6577 9.5  8.4 - 10.5 mg/dL Final  . GFR 46/96/2952 56.69* >60.00 mL/min Final  . Hgb A1c MFr Bld 02/07/2019 5.9  4.6 - 6.5 % Final   Glycemic Control Guidelines for People with Diabetes:Non Diabetic:  <6%Goal of Therapy: <7%Additional Action Suggested:  >8%     Allergies as of 02/11/2019      Reactions   Acetaminophen Nausea Only   Actos [pioglitazone Hydrochloride]    edema   Aspirin Nausea Only   Atorvastatin Other (See Comments)   Felt drunk/high floating   Morphine  Sulfate Other (See Comments)   Burns injecting "arm is on fire"   Naproxen Sodium Nausea Only   Sitagliptin Phosphate Nausea And Vomiting      Medication List       Accurate as of February 11, 2019  9:51 AM. Always use your most recent med list.        Accu-Chek Compact Plus test strip Generic drug:  glucose blood Use to check blood sugar once a day dx code E11.9   Accu-Chek Softclix Lancets lancets Use to check blood sugar once a day dx code E11.9   amitriptyline 25 MG tablet Commonly known as:  ELAVIL Take 25 mg by mouth at bedtime.   ezetimibe 10 MG tablet Commonly known as:  ZETIA Take 10 mg by mouth daily.   folic acid 0.5 MG tablet Commonly known as:  FOLVITE Take 0.5 mg by mouth daily.   gabapentin 300 MG capsule Commonly known as:  NEURONTIN Take 300 mg by mouth daily.   Ginger 500 MG Caps Take 1 capsule by mouth daily.   hydrochlorothiazide 12.5 MG capsule Commonly known as:  MICROZIDE TAKE 1 CAPSULE BY MOUTH ONCE DAILY   iron polysaccharides 150 MG capsule Commonly known as:  Ferrex 150 Take 1 capsule (150 mg total) by mouth 2 (two) times daily.   Klor-Con M20 20 MEQ tablet Generic drug:  potassium chloride SA TAKE 1 TABLET BY MOUTH DAILY.   labetalol 100 MG tablet Commonly known as:  NORMODYNE TAKE 1 TABLET BY MOUTH TWICE A DAY   metFORMIN 1000 MG tablet Commonly known as:  GLUCOPHAGE TAKE 1 TABLET (1,000 MG TOTAL) BY MOUTH 2 (TWO) TIMES DAILY.   methotrexate 2.5 MG tablet Commonly known as:  RHEUMATREX Take 15 mg by mouth once a week. Patient takes 3 tablets twice once a week to add up to be 6 tablets once weekly, starts with 2 tablets in the evening and takes 2 more tablets in the morning   montelukast 10 MG tablet Commonly known as:  SINGULAIR Take 10 mg by mouth daily.  NONFORMULARY OR COMPOUNDED ITEM [facility] compound:  Peripheral Neuropathy cream - Bu[ovacaome 1%, Doxepin 3%, Gabapentin 6%, Pentoxifylline 3%, Topiramate 1%,  dispense 120grams, apply 1-2 grams to affected area 3-4 times daily, + 3 refills.   Nucynta 50 MG tablet Generic drug:  tapentadol Take 50 mg by mouth as needed for severe pain (spasms).   pravastatin 80 MG tablet Commonly known as:  PRAVACHOL TAKE 1 TABLET BY MOUTH DAILY   spironolactone 100 MG tablet Commonly known as:  ALDACTONE TAKE 1 TABLET BY MOUTH EVERY DAY   Turmeric 450 MG Caps Take 1 capsule by mouth daily.   vitamin B-12 1000 MCG tablet Commonly known as:  CYANOCOBALAMIN Take 1,000 mcg by mouth daily.       Allergies:  Allergies  Allergen Reactions  . Acetaminophen Nausea Only  . Actos [Pioglitazone Hydrochloride]     edema  . Aspirin Nausea Only  . Atorvastatin Other (See Comments)    Felt drunk/high floating  . Morphine Sulfate Other (See Comments)    Burns injecting "arm is on fire"  . Naproxen Sodium Nausea Only  . Sitagliptin Phosphate Nausea And Vomiting    Past Medical History:  Diagnosis Date  . Allergic rhinitis   . Diabetes mellitus    TYPE 2  . Dyslipidemia   . Hyperkalemia   . Hypertension   . Insomnia    Pt stated no trouble falling or staying asleep.  . RA (rheumatoid arthritis) (HCC)     Past Surgical History:  Procedure Laterality Date  . BREAST CYST EXCISION Right 1967  . HAND TENDON SURGERY     Right  . OVARIAN CYST SURGERY Right 1988    Family History  Problem Relation Age of Onset  . Diabetes Mother   . Diabetes Father   . Cancer Father        Prostate  . Diabetes Sister   . Stroke Maternal Grandmother        Cause of death  . Healthy Brother     Social History:  reports that she has never smoked. She has never used smokeless tobacco. She reports that she does not drink alcohol or use drugs.  Review of Systems:   History of rheumatoid arthritis on  Methotrexate   Vitamin D deficiency: Currently taking vitamin D3, 1000 U daily  LIPIDS: Taking Zetia from PCP and is on pravastatin 80 mg  Lab Results   Component Value Date   CHOL 149 06/25/2018   HDL 66.60 06/25/2018   LDLCALC 73 06/25/2018   TRIG 45.0 06/25/2018   CHOLHDL 2 06/25/2018   Lab Results  Component Value Date   ALT 17 02/07/2019   ALT 14 01/14/2019   ALT 23 09/11/2017       Examination:   BP (!) 152/88 (BP Location: Left Arm, Patient Position: Sitting, Cuff Size: Normal)   Pulse 88   Ht 5\' 4"  (1.626 m)   Wt 161 lb (73 kg)   SpO2 98%   BMI 27.64 kg/m   Body mass index is 27.64 kg/m.    Diabetic Foot Exam - Simple   Simple Foot Form Diabetic Foot exam was performed with the following findings:  Yes 02/11/2019 10:06 AM  Visual Inspection No deformities, no ulcerations, no other skin breakdown bilaterally:  Yes See comments:  Yes Sensation Testing Intact to touch and monofilament testing bilaterally:  Yes See comments:  Yes Pulse Check Posterior Tibialis and Dorsalis pulse intact bilaterally:  Yes Comments She has pigmentation of her  feet with some thickening of the skin Also has thickened skin on the plantar surfaces where monofilament sensation is unreliable      Assesment/PLAN:  Hypertension:   Blood pressure is higher today because of her not taking her medications recently because of gastroenteritis She thinks her blood pressure is well controlled at the drugstore or at home She is on a 3 drug regimen of  Aldactone, labetalol and  HCTZ  Recommend that she follow-up with her PCP and cardiologist as scheduled to confirm blood pressure is improving with restarting her medications  RENAL dysfunction: Her creatinine previously was higher and now back to normal   Hypokalemia: She has a consistently normal potassium level with 100 mg Aldactone and Supplementing with only 1 tablet of potassium, this is likely from renal potassium wasting   DIABETES: Well controlled with metformin monotherapy  A1c excellent at 5.9 She has lost weight She is checking sugars in the mornings at home and discussed  need to alternate fasting and postprandial readings Also since she is using an old Accu-Chek meter may consider replacing this on the next visit  She will continue metformin unchanged    She will follow-up in 4 months   There are no Patient Instructions on file for this visit.   Reather Littler 02/11/2019, 9:51 AM

## 2019-03-22 ENCOUNTER — Other Ambulatory Visit: Payer: Self-pay

## 2019-03-22 NOTE — Patient Outreach (Signed)
  [facility]) Care Management Chronic Special Needs Program  03/22/2019  Name: April [name] DOB: [birth year]  MRN: [id number]  [name] is enrolled in a Chronic Special Needs Plan.   RNCM returned client call in response to RNCM's outreach. Client does not have Health risk assessment completed. RNCM called to complete heatlh risk assessment and update individualized care plan. No answer. HIPPA compliant message left.   Plan: await return call and follow up at next scheduled outreach if no return call.   Kathyrn Sheriff, RN, MSN, [facility] Chronic Care Management Coordinator [facility] [phone]

## 2019-03-23 ENCOUNTER — Other Ambulatory Visit: Payer: Self-pay | Admitting: Endocrinology

## 2019-04-07 ENCOUNTER — Other Ambulatory Visit: Payer: Self-pay | Admitting: Endocrinology

## 2019-04-11 ENCOUNTER — Other Ambulatory Visit: Payer: Self-pay

## 2019-04-11 NOTE — Patient Outreach (Signed)
  [facility]) Care Management Chronic Special Needs Program    04/11/2019  Name: April [name], DOB: [birth year]  MRN: [id number]   April [name] is enrolled in a chronic special needs plan. RNCM called to assist client with completing health risk assessment and reviewing individualized care plan. RNCM spoke with client who confirmed identity with two identifiers. Client reports this is not a good time to talk. Client states next week would be a better time.  Plan: outreach call scheduled for next week.  Kathyrn Sheriff, RN, MSN, [facility] Chronic Care Management Coordinator [facility] [phone]

## 2019-04-15 ENCOUNTER — Other Ambulatory Visit: Payer: Self-pay

## 2019-04-15 NOTE — Patient Outreach (Signed)
  [facility]) Care Management Chronic Special Needs Program  04/15/2019  Name: April [name] DOB: 1941-10-02  MRN: [id number]  [name] is enrolled in a Chronic Special Needs Plan. RNCM called to follow up regarding completion of health risk assessment and individualized care plan. No answer. HIPPA compliant message left. This is the 3rd unsuccessful outreach attempt.  Plan: Chronic care management coordinator will send unsuccessful outreach letter. RNCM will make next outreach in 6-7 months if no return call.   Kathyrn Sheriff, RN, MSN, [facility] Chronic Care Management Coordinator [facility] [phone]

## 2019-04-16 DIAGNOSIS — M0589 Other rheumatoid arthritis with rheumatoid factor of multiple sites: Secondary | ICD-10-CM | POA: Diagnosis not present

## 2019-04-16 DIAGNOSIS — M25519 Pain in unspecified shoulder: Secondary | ICD-10-CM | POA: Diagnosis not present

## 2019-04-16 DIAGNOSIS — N289 Disorder of kidney and ureter, unspecified: Secondary | ICD-10-CM | POA: Diagnosis not present

## 2019-04-16 DIAGNOSIS — M8580 Other specified disorders of bone density and structure, unspecified site: Secondary | ICD-10-CM | POA: Diagnosis not present

## 2019-04-16 DIAGNOSIS — M549 Dorsalgia, unspecified: Secondary | ICD-10-CM | POA: Diagnosis not present

## 2019-04-16 DIAGNOSIS — M150 Primary generalized (osteo)arthritis: Secondary | ICD-10-CM | POA: Diagnosis not present

## 2019-04-16 DIAGNOSIS — E119 Type 2 diabetes mellitus without complications: Secondary | ICD-10-CM | POA: Diagnosis not present

## 2019-04-16 DIAGNOSIS — Z79899 Other long term (current) drug therapy: Secondary | ICD-10-CM | POA: Diagnosis not present

## 2019-04-28 ENCOUNTER — Other Ambulatory Visit: Payer: Self-pay | Admitting: Endocrinology

## 2019-04-30 ENCOUNTER — Other Ambulatory Visit: Payer: Self-pay

## 2019-04-30 MED ORDER — SPIRONOLACTONE 100 MG PO TABS: ORAL_TABLET | ORAL | 1 refills | Status: DC

## 2019-04-30 NOTE — Telephone Encounter (Signed)
Please review for refill.  

## 2019-05-21 ENCOUNTER — Other Ambulatory Visit: Payer: Self-pay

## 2019-05-21 ENCOUNTER — Ambulatory Visit: Payer: HMO | Attending: Rheumatology

## 2019-05-21 DIAGNOSIS — M25611 Stiffness of right shoulder, not elsewhere classified: Secondary | ICD-10-CM | POA: Diagnosis not present

## 2019-05-21 DIAGNOSIS — M25512 Pain in left shoulder: Secondary | ICD-10-CM | POA: Diagnosis not present

## 2019-05-21 DIAGNOSIS — G8929 Other chronic pain: Secondary | ICD-10-CM | POA: Insufficient documentation

## 2019-05-21 DIAGNOSIS — M25511 Pain in right shoulder: Secondary | ICD-10-CM | POA: Insufficient documentation

## 2019-05-21 DIAGNOSIS — M25612 Stiffness of left shoulder, not elsewhere classified: Secondary | ICD-10-CM

## 2019-05-21 NOTE — Therapy (Signed)
[place], [place], [address] Phone: [phone]   Fax:  [phone]  Physical Therapy Evaluation  Patient Details  Name: April [name] MRN: [id number] Date of Birth: 1941/05/20 Referring Provider (PT): Sabino Niemann, MD   Encounter Date: 05/21/2019  PT End of Session - 05/21/19 0813    Visit Number  1    Number of Visits  12    Date for PT Re-Evaluation  06/28/19    Authorization Type  [employer]    Authorization Time Period  progess at 10th visit KX at 15    PT Start Time  0810    PT Stop Time  0850    PT Time Calculation (min)  40 min    Activity Tolerance  Patient tolerated treatment well       Past Medical History:  Diagnosis Date  . Allergic rhinitis   . Diabetes mellitus    TYPE 2  . Dyslipidemia   . Hyperkalemia   . Hypertension   . Insomnia    Pt stated no trouble falling or staying asleep.  . RA (rheumatoid arthritis) ([place])     Past Surgical History:  Procedure Laterality Date  . BREAST CYST EXCISION Right 1967  . HAND TENDON SURGERY     Right  . OVARIAN CYST SURGERY Right 1988    There were no vitals filed for this visit.   Subjective Assessment - 05/21/19 0817    Subjective  She reports bilateral shoulder pain with RT being worse . Injection eased pain and incr ROM.  She reports decr ROM. not able to reach oerheasd and behind neck and back.    Patient Stated Goals  She wants to incr ROM. ease soreness.    Currently in Pain?  Yes    Pain Score  6     Pain Location  Shoulder    Pain Orientation  Right;Lateral;Proximal    Pain Descriptors / Indicators  Aching    Pain Type  Chronic pain    Pain Onset  More than a month ago    Pain Frequency  Constant    Aggravating Factors   moving arm    Pain Relieving Factors  heat, cream,         [facility] PT Assessment - 05/21/19 0001      Assessment   Medical Diagnosis  RT shoulder pain    Referring Provider (PT)   Sabino Niemann, MD    Onset Date/Surgical Date  --   11/2018   Hand Dominance  Right    Next MD Visit  As needed    Prior Therapy  No      Precautions   Precautions  None      Restrictions   Weight Bearing Restrictions  No      Balance Screen   Has the patient fallen in the past 6 months  No   She reports multiple falls prior to first of 2020     Prior Function   Level of [place]  Retired      Associate Professor   Overall Cognitive Status  Within Functional Limits for tasks assessed      Observation/Other Assessments   Focus on Therapeutic Outcomes (FOTO)   49% limited      ROM / Strength   AROM / PROM / Strength  AROM;PROM;Strength      AROM   AROM Assessment Site  Shoulder    Right/Left  Shoulder  Right;Left    Right Shoulder Flexion  150 Degrees    Right Shoulder ABduction  135 Degrees    Right Shoulder Internal Rotation  45 Degrees    Right Shoulder External Rotation  90 Degrees    Right Shoulder Horizontal ABduction  5 Degrees    Right Shoulder Horizontal  ADduction  115 Degrees    Left Shoulder Flexion  130 Degrees    Left Shoulder ABduction  125 Degrees    Left Shoulder Internal Rotation  35 Degrees    Left Shoulder External Rotation  75 Degrees      PROM   Overall PROM Comments  RT shoulder  ROM WNL except reaching behind back  but Lt only able toreach buttock an dRT she can touch sacrum and assisted to lower lumbar.     PROM Assessment Site  Shoulder      Strength   Overall Strength Comments  WNL bilateral with some pain    Strength Assessment Site  Shoulder                Objective measurements completed on examination: See above findings.              PT Education - 05/21/19 [phone]    Education Details  POC HEP    Person(s) Educated  Patient    Methods  Explanation;Demonstration;Verbal cues;Handout;Tactile cues    Comprehension  Returned demonstration;Verbalized understanding          PT Long Term Goals  - 05/21/19 7062      PT LONG TERM GOAL #1   Title  She will be independent with all hEP issued    Time  4    Period  Weeks    Status  New      PT LONG TERM GOAL #2   Title  She will be able to each to lower back with RT and LT hand to be able to dress and provide self care with less pain    Time  4    Period  Weeks    Status  New      PT LONG TERM GOAL #3   Title  She will report 50% decr pain in general    Time  4    Period  Weeks    Status  New      PT LONG TERM GOAL #4   Title  FOTO score will decrease to  39 %  limited    Time  4    Period  Weeks    Status  New             Plan - 05/21/19 0814    Clinical Impression Statement  Ms April [name] presents with reports of chronic shoulder pain RT worse thanLT that has significantly improved with cortixone injection  to RT shoulder so much so the AROM on RT is better than on the LT.    She is primarily limited to reaching behind back. He rpain is moderate  even at rest but did not significantly incr with stretching.  She should improve with skilled PT.  [name]    Personal Factors and Comorbidities  Time since onset of injury/illness/exacerbation    Examination-Activity Limitations  Bathing;Reach Overhead;Dressing    Stability/Clinical Decision Making  Stable/Uncomplicated    Clinical Decision Making  Low    Rehab Potential  Good    PT Frequency  2x / week    PT Duration  4 weeks  PT Treatment/Interventions  Taping;Passive range of motion;Manual techniques;Patient/family education;Therapeutic exercise;Iontophoresis 4mg /ml Dexamethasone;Moist Heat;Ultrasound    PT Next Visit Plan  review HEP , manual and modalities as needed  possible rock wood    PT Home Exercise Plan  reaching behid back, door ER, wall slides for flexion    Consulted and Agree with Plan of Care  Patient       Patient will benefit from skilled therapeutic intervention in order to improve the following deficits and impairments:  Pain, Impaired UE functional use,  Decreased activity tolerance, Decreased range of motion  Visit Diagnosis: 1. Chronic right shoulder pain   2. Chronic left shoulder pain   3. Stiffness of right shoulder, not elsewhere classified   4. Stiffness of left shoulder, not elsewhere classified        Problem List Patient Active Problem List   Diagnosis Date Noted  . Iron deficiency anemia 01/20/2019  . Encounter for long-term (current) use of other medications 02/29/2012  . Hypertension   . Diabetes mellitus   . Hypokalemia   . RA (rheumatoid arthritis) ([facility])   . EDEMA 08/27/2010  . CARPAL TUNNEL SYNDROME, BILATERAL 10/28/2009  . NUMBNESS 10/28/2009  . Diabetes mellitus without complication ([facility]) 09/20/2007  . Hyperlipidemia 09/19/2007  . ALLERGIC RHINITIS 09/19/2007  . INSOMNIA 09/19/2007    Caprice Redhasse, Cloyde Oregel M  PT 05/21/2019, 9:05 AM  [facility] [address] [place], [place], [address] Phone: [phone]   Fax:  [phone]  Name: April [name] MRN: [id number] Date of Birth: 1941-09-23

## 2019-05-21 NOTE — Patient Instructions (Signed)
From cabinet wall slides,  Reach behind back , door ER  10-30 sec 1-3 reps 3-5x/day

## 2019-05-27 ENCOUNTER — Encounter: Payer: Self-pay | Admitting: Physical Therapy

## 2019-05-27 ENCOUNTER — Other Ambulatory Visit: Payer: Self-pay

## 2019-05-27 ENCOUNTER — Ambulatory Visit: Payer: HMO | Admitting: Physical Therapy

## 2019-05-27 DIAGNOSIS — M25511 Pain in right shoulder: Secondary | ICD-10-CM | POA: Diagnosis not present

## 2019-05-27 DIAGNOSIS — M25611 Stiffness of right shoulder, not elsewhere classified: Secondary | ICD-10-CM

## 2019-05-27 DIAGNOSIS — M25612 Stiffness of left shoulder, not elsewhere classified: Secondary | ICD-10-CM

## 2019-05-27 DIAGNOSIS — G8929 Other chronic pain: Secondary | ICD-10-CM

## 2019-05-27 NOTE — Patient Instructions (Signed)
SHOULDER: External Rotation - Supine (Cane)   Hold cane with both hands. Rotate arm away from body. Keep elbow on floor and next to body. _10-20__ reps per set, __2_ sets per day, _7__ days per week Add towel to keep elbow at side.  Copyright  VHI. All rights reserved.  Cane Horizontal - Supine   With straight arms holding cane above shoulders, bring cane out to right, center, out to left, and back to above head. Repeat _10-20__ times. Do _2__ times per day.  Copyright  VHI. All rights reserved.  Cane Exercise: Flexion   Lie on back, holding cane above chest. Keeping arms as straight as possible, lower cane toward floor beyond head. Hold __5__ seconds. Repeat __10-20__ times. Do _2___ sessions per day.    

## 2019-05-27 NOTE — Therapy (Signed)
[facility] [address] [place], [place], [address] Phone: [phone]   Fax:  [phone]  Physical Therapy Treatment  Patient Details  Name: April [name] MRN: [id number] Date of Birth: 1941/09/09 Referring Provider (PT): Haydee Salter, MD   Encounter Date: 05/27/2019  PT End of Session - 05/27/19 1122    Visit Number  2    Number of Visits  12    Date for PT Re-Evaluation  06/28/19    PT Start Time  1115    PT Stop Time  1200    PT Time Calculation (min)  45 min       Past Medical History:  Diagnosis Date  . Allergic rhinitis   . Diabetes mellitus    TYPE 2  . Dyslipidemia   . Hyperkalemia   . Hypertension   . Insomnia    Pt stated no trouble falling or staying asleep.  . RA (rheumatoid arthritis) ([facility])     Past Surgical History:  Procedure Laterality Date  . BREAST CYST EXCISION Right 1967  . HAND TENDON SURGERY     Right  . OVARIAN CYST SURGERY Right 1988    There were no vitals filed for this visit.  Subjective Assessment - 05/27/19 1120    Subjective  I have not done a lot of my exercises but I have been reaching behind my back.    Currently in Pain?  Yes    Pain Score  6     Pain Location  Shoulder    Pain Orientation  Left    Pain Descriptors / Indicators  --   stiffness   Pain Type  Chronic pain    Aggravating Factors   weather, caring for foster child    Pain Relieving Factors  heat, cream, injection                       [facility] Adult PT Treatment/Exercise - 05/27/19 0001      Exercises   Exercises  Shoulder      Shoulder Exercises: Supine   Other Supine Exercises  supine cane       Shoulder Exercises: Standing   Row  10 reps    Theraband Level (Shoulder Row)  Level 2 (Red)      Shoulder Exercises: Pulleys   Flexion  2 minutes      Shoulder Exercises: ROM/Strengthening   UBE (Upper Arm Bike)  forward and retro L! x 2 min each     Other ROM/Strengthening  Exercises  lower trap wall slides bilat       Shoulder Exercises: Stretch   Corner Stretch Limitations  doorway stretch 3 x 30 sec       Manual Therapy   Manual Therapy  Passive ROM    Passive ROM  bilateral flexion, abdcution, ER , IR              PT Education - 05/27/19 1153    Education Details  HEP    Person(s) Educated  Patient    Methods  Explanation;Demonstration;Handout    Comprehension  Verbalized understanding          PT Long Term Goals - 05/21/19 0842      PT LONG TERM GOAL #1   Title  She will be independent with all hEP issued    Time  4    Period  Weeks    Status  New      PT LONG TERM  GOAL #2   Title  She will be able to each to lower back with RT and LT hand to be able to dress and provide self care with less pain    Time  4    Period  Weeks    Status  New      PT LONG TERM GOAL #3   Title  She will report 50% decr pain in general    Time  4    Period  Weeks    Status  New      PT LONG TERM GOAL #4   Title  FOTO score will decrease to  39 %  limited    Time  4    Period  Weeks    Status  New            Plan - 05/27/19 1139    Clinical Impression Statement  Pt reports less right shoulder pain, and notes pain more in right elbow lately. Left shoulder is stiff and rated at 6/10 pain today. Reviewed HEP and advanced with AAROM. Some anterior shoulder pain with resisted rows.    PT Next Visit Plan  review HEP , manual and modalities as needed  possible rock wood    PT Home Exercise Plan  reaching behid back, door ER, wall slides for flexion    Consulted and Agree with Plan of Care  Patient       Patient will benefit from skilled therapeutic intervention in order to improve the following deficits and impairments:  Pain, Impaired UE functional use, Decreased activity tolerance, Decreased range of motion  Visit Diagnosis: 1. Chronic right shoulder pain   2. Chronic left shoulder pain   3. Stiffness of right shoulder, not elsewhere  classified   4. Stiffness of left shoulder, not elsewhere classified        Problem List Patient Active Problem List   Diagnosis Date Noted  . Iron deficiency anemia 01/20/2019  . Encounter for long-term (current) use of other medications 02/29/2012  . Hypertension   . Diabetes mellitus   . Hypokalemia   . RA (rheumatoid arthritis) ([place])   . EDEMA 08/27/2010  . CARPAL TUNNEL SYNDROME, BILATERAL 10/28/2009  . NUMBNESS 10/28/2009  . Diabetes mellitus without complication ([place]) 42/70/6237  . Hyperlipidemia 09/19/2007  . ALLERGIC RHINITIS 09/19/2007  . INSOMNIA 09/19/2007    Dorene Ar, PTA 05/27/2019, 12:06 PM  [facility] [address] [place], [place], [address] Phone: [phone]   Fax:  [phone]  Name: JORITA BOHANON MRN: [id number] Date of Birth: 1941/03/07

## 2019-05-28 ENCOUNTER — Other Ambulatory Visit: Payer: Self-pay | Admitting: Endocrinology

## 2019-05-29 ENCOUNTER — Ambulatory Visit: Payer: HMO

## 2019-05-29 ENCOUNTER — Other Ambulatory Visit: Payer: Self-pay

## 2019-05-29 DIAGNOSIS — G8929 Other chronic pain: Secondary | ICD-10-CM

## 2019-05-29 DIAGNOSIS — M25611 Stiffness of right shoulder, not elsewhere classified: Secondary | ICD-10-CM

## 2019-05-29 DIAGNOSIS — M25511 Pain in right shoulder: Secondary | ICD-10-CM | POA: Diagnosis not present

## 2019-05-29 DIAGNOSIS — M25612 Stiffness of left shoulder, not elsewhere classified: Secondary | ICD-10-CM

## 2019-05-29 DIAGNOSIS — M25512 Pain in left shoulder: Secondary | ICD-10-CM

## 2019-05-29 NOTE — Therapy (Signed)
[place], [place], [address] Phone: [phone]   Fax:  [phone]  Physical Therapy Treatment  Patient Details  Name: April [name] MRN: [id number] Date of Birth: Apr 20, 1941 Referring Provider (PT): Sabino Niemann, MD   Encounter Date: 05/29/2019  PT End of Session - 05/29/19 1115    Visit Number  3    Number of Visits  12    Date for PT Re-Evaluation  06/28/19    Authorization Type  Health team advantage    PT Start Time  1115    PT Stop Time  1205    PT Time Calculation (min)  50 min    Activity Tolerance  Patient tolerated treatment well;Patient limited by pain    Behavior During Therapy  [facility] for tasks assessed/performed       Past Medical History:  Diagnosis Date  . Allergic rhinitis   . Diabetes mellitus    TYPE 2  . Dyslipidemia   . Hyperkalemia   . Hypertension   . Insomnia    Pt stated no trouble falling or staying asleep.  . RA (rheumatoid arthritis) ([place])     Past Surgical History:  Procedure Laterality Date  . BREAST CYST EXCISION Right 1967  . HAND TENDON SURGERY     Right  . OVARIAN CYST SURGERY Right 1988    There were no vitals filed for this visit.  Subjective Assessment - 05/29/19 1139    Subjective  Doing some better . No increase pain.  RT elbow has some pain.    Pain Score  0-No pain    Pain Location  Shoulder    Pain Orientation  Right;Left    Multiple Pain Sites  Yes    Pain Score  6    Pain Location  Elbow    Pain Orientation  Right    Pain Descriptors / Indicators  Aching    Pain Type  Chronic pain    Pain Frequency  Constant                       [facility] Adult PT Treatment/Exercise - 05/29/19 0001      Shoulder Exercises: Supine   Other Supine Exercises  supine cane x 15 bench prees and flexion overhead and hor abduction      Shoulder Exercises: Standing   Row  Both;15 reps    Theraband Level (Shoulder Row)  Level 2 (Red)       Shoulder Exercises: Pulleys   Flexion  3 minutes      Shoulder Exercises: ROM/Strengthening   UBE (Upper Arm Bike)  forward and retro L! x 2 min each     Other ROM/Strengthening Exercises  lower trap wall slides bilat x 10      Shoulder Exercises: Stretch   Corner Stretch Limitations  doorway stretch 3 x 30 sec       Manual Therapy   Passive ROM  bilateral flexion, abdcution, ER , IR  , extension and reaching behind back      Assisted ROM for flexion /extension/ rotation with gentle end range stretch       PT Education - 05/29/19 1221    Education Details  used model to describe AC joint and poossible reason for sharp protrusion and LT AC joint. She will talk to her ortho MD    Person(s) Educated  Patient    Methods  Explanation    Comprehension  Verbalized understanding  PT Long Term Goals - 05/29/19 1223      PT LONG TERM GOAL #1   Title  She will be independent with all hEP issued    Status  On-going      PT LONG TERM GOAL #2   Title  She will be able to each to lower back with RT and LT hand to be able to dress and provide self care with less pain    Baseline  able to reach lower back with LT and thumb to lower back  on LT    Status  Partially Met      PT LONG TERM GOAL #3   Title  She will report 50% decr pain in general    Status  On-going      PT LONG TERM GOAL #4   Title  FOTO score will decrease to  39 %  limited    Status  Unable to assess            Plan - 05/29/19 1115    Clinical Impression Statement  She tolerated all activity though had pain at end range LT and RT shoulder and more pain with RT elbow with movemnt or shoulder.    PT Treatment/Interventions  Taping;Passive range of motion;Manual techniques;Patient/family education;Therapeutic exercise;Iontophoresis 38m/ml Dexamethasone;Moist Heat;Ultrasound    PT Next Visit Plan  review HEP , manual and modalities as needed  possible rock wood    PT Home Exercise Plan  reaching  behind back, door ER, wall slides for flexion    Consulted and Agree with Plan of Care  Patient       Patient will benefit from skilled therapeutic intervention in order to improve the following deficits and impairments:     Visit Diagnosis: 1. Chronic left shoulder pain   2. Chronic right shoulder pain   3. Stiffness of right shoulder, not elsewhere classified   4. Stiffness of left shoulder, not elsewhere classified        Problem List Patient Active Problem List   Diagnosis Date Noted  . Iron deficiency anemia 01/20/2019  . Encounter for long-term (current) use of other medications 02/29/2012  . Hypertension   . Diabetes mellitus   . Hypokalemia   . [place]oid [place]) ([place]   . EDEMA 08/27/2010  . CARPAL TUNNEL SYNDROME, BILATERAL 10/28/2009  . NUMBNESS 10/28/2009  . Diabetes mellitus without complication ([place] 112/25/8346 . Hyperlipidemia 09/19/2007  . ALLERGIC RHINITIS 09/19/2007  . INSOMNIA 09/19/2007    CDarrel Hoover PT 05/29/2019, 12:29 PM  [facility] [address]Phone: [phone]  Fax:  [phone] Name: April HICKLINGMRN: [id number] of Birth: [birth year]

## 2019-06-03 ENCOUNTER — Other Ambulatory Visit: Payer: Self-pay

## 2019-06-03 ENCOUNTER — Ambulatory Visit: Payer: HMO | Admitting: Physical Therapy

## 2019-06-03 ENCOUNTER — Encounter: Payer: Self-pay | Admitting: Physical Therapy

## 2019-06-03 DIAGNOSIS — M25611 Stiffness of right shoulder, not elsewhere classified: Secondary | ICD-10-CM

## 2019-06-03 DIAGNOSIS — M25612 Stiffness of left shoulder, not elsewhere classified: Secondary | ICD-10-CM

## 2019-06-03 DIAGNOSIS — M25512 Pain in left shoulder: Secondary | ICD-10-CM

## 2019-06-03 DIAGNOSIS — G8929 Other chronic pain: Secondary | ICD-10-CM

## 2019-06-03 DIAGNOSIS — M25511 Pain in right shoulder: Secondary | ICD-10-CM | POA: Diagnosis not present

## 2019-06-03 NOTE — Therapy (Signed)
[place], [place], [address] Phone: [phone]   Fax:  [phone]  Physical Therapy Treatment  Patient Details  Name: April [name] MRN: [id number] Date of Birth: Feb 06, 1941 Referring Provider (PT): Sabino Niemann, MD   Encounter Date: 06/03/2019  PT End of Session - 06/03/19 0834    Visit Number  4    Number of Visits  12    Date for PT Re-Evaluation  06/28/19    PT Start Time  0830    PT Stop Time  0910    PT Time Calculation (min)  40 min       Past Medical History:  Diagnosis Date  . Allergic rhinitis   . Diabetes mellitus    TYPE 2  . Dyslipidemia   . Hyperkalemia   . Hypertension   . Insomnia    Pt stated no trouble falling or staying asleep.  . RA (rheumatoid arthritis) ([place])     Past Surgical History:  Procedure Laterality Date  . BREAST CYST EXCISION Right 1967  . HAND TENDON SURGERY     Right  . OVARIAN CYST SURGERY Right 1988    There were no vitals filed for this visit.  Subjective Assessment - 06/03/19 0833    Subjective  I was very sore I think I over stretched myself last visit. Pain resolved last night. No pain now.    Currently in Pain?  No/denies         [facility] PT Assessment - 06/03/19 0001      AROM   Right Shoulder Flexion  155 Degrees    Right Shoulder ABduction  155 Degrees    Right Shoulder Internal Rotation  --   reach to L1 middle   Right Shoulder External Rotation  --   reach t2   Left Shoulder Flexion  130 Degrees    Left Shoulder ABduction  138 Degrees    Left Shoulder Internal Rotation  --   Lt side of L5   Left Shoulder External Rotation  --   reach t2                  [facility] Adult PT Treatment/Exercise - 06/03/19 0001      Shoulder Exercises: Supine   Other Supine Exercises  supine cane x 15 bench prees and flexion overhead and hor abduction      Shoulder Exercises: Standing   External Rotation  10 reps    Theraband  Level (Shoulder External Rotation)  Level 1 (Yellow)    Internal Rotation  10 reps    Theraband Level (Shoulder Internal Rotation)  Level 1 (Yellow)    Extension  10 reps    Theraband Level (Shoulder Extension)  Level 1 (Yellow)    Row  Both;15 reps    Theraband Level (Shoulder Row)  Level 2 (Red)      Shoulder Exercises: Pulleys   Flexion  2 minutes    ABduction  1 minute      Shoulder Exercises: ROM/Strengthening   UBE (Upper Arm Bike)  forward and retro L! x 2 min each     Other ROM/Strengthening Exercises  lower trap wall slides bilat x 10      Shoulder Exercises: Stretch   Corner Stretch Limitations  doorway stretch 3 x 30 sec       Manual Therapy   Passive ROM  --  PT Long Term Goals - 05/29/19 1223      PT LONG TERM GOAL #1   Title  She will be independent with all hEP issued    Status  On-going      PT LONG TERM GOAL #2   Title  She will be able to each to lower back with RT and LT hand to be able to dress and provide self care with less pain    Baseline  able to reach lower back with LT and thumb to lower back  on LT    Status  Partially Met      PT LONG TERM GOAL #3   Title  She will report 50% decr pain in general    Status  On-going      PT LONG TERM GOAL #4   Title  FOTO score will decrease to  39 %  limited    Status  Unable to assess            Plan - 06/03/19 0907    Clinical Impression Statement  Increased pain after last session that resolved yesterday. Improved AROM bilateral. Began yellow band RTC strength and she tolerated well.    PT Next Visit Plan  assess tolerance to rtc strengthening and update HEP if appropriate.    PT Home Exercise Plan  reaching behind back, door ER, wall slides for flexion       Patient will benefit from skilled therapeutic intervention in order to improve the following deficits and impairments:  Pain, Impaired UE functional use, Decreased activity tolerance, Decreased range of  motion  Visit Diagnosis: 1. Chronic left shoulder pain   2. Chronic right shoulder pain   3. Stiffness of right shoulder, not elsewhere classified   4. Stiffness of left shoulder, not elsewhere classified        Problem List Patient Active Problem List   Diagnosis Date Noted  . Iron deficiency anemia 01/20/2019  . Encounter for long-term (current) use of other medications 02/29/2012  . Hypertension   . Diabetes mellitus   . Hypokalemia   . RA (rheumatoid arthritis) ([place])   . EDEMA 08/27/2010  . CARPAL TUNNEL SYNDROME, BILATERAL 10/28/2009  . NUMBNESS 10/28/2009  . Diabetes mellitus without complication ([place]) 71/05/2693  . Hyperlipidemia 09/19/2007  . ALLERGIC RHINITIS 09/19/2007  . INSOMNIA 09/19/2007    April [name], PTA 06/03/2019, 9:20 AM  [facility] [address] [place], [place], [address] Phone: [phone]   Fax:  [phone]  Name: April [name] MRN: [id number] Date of Birth: [birth year]

## 2019-06-04 ENCOUNTER — Other Ambulatory Visit: Payer: Self-pay

## 2019-06-05 ENCOUNTER — Other Ambulatory Visit: Payer: Self-pay

## 2019-06-05 ENCOUNTER — Ambulatory Visit: Payer: HMO | Admitting: Physical Therapy

## 2019-06-05 ENCOUNTER — Ambulatory Visit: Payer: HMO | Attending: Rheumatology | Admitting: Physical Therapy

## 2019-06-05 ENCOUNTER — Encounter: Payer: Self-pay | Admitting: Physical Therapy

## 2019-06-05 ENCOUNTER — Other Ambulatory Visit: Payer: Self-pay | Admitting: Hematology

## 2019-06-05 DIAGNOSIS — G894 Chronic pain syndrome: Secondary | ICD-10-CM | POA: Diagnosis not present

## 2019-06-05 DIAGNOSIS — M25612 Stiffness of left shoulder, not elsewhere classified: Secondary | ICD-10-CM | POA: Diagnosis not present

## 2019-06-05 DIAGNOSIS — M25611 Stiffness of right shoulder, not elsewhere classified: Secondary | ICD-10-CM | POA: Insufficient documentation

## 2019-06-05 DIAGNOSIS — M542 Cervicalgia: Secondary | ICD-10-CM | POA: Diagnosis not present

## 2019-06-05 DIAGNOSIS — M25511 Pain in right shoulder: Secondary | ICD-10-CM | POA: Insufficient documentation

## 2019-06-05 DIAGNOSIS — G8929 Other chronic pain: Secondary | ICD-10-CM | POA: Insufficient documentation

## 2019-06-05 DIAGNOSIS — M25512 Pain in left shoulder: Secondary | ICD-10-CM | POA: Insufficient documentation

## 2019-06-05 NOTE — Therapy (Signed)
[place], [place], [address] Phone: [phone]   Fax:  [phone]  Physical Therapy Treatment  Patient Details  Name: April [name] MRN: [id number] Date of Birth: 1940/12/26 Referring Provider (PT): Sabino Niemann, MD   Encounter Date: 06/05/2019  PT End of Session - 06/05/19 1549    Visit Number  5    Number of Visits  12    Date for PT Re-Evaluation  06/28/19    PT Start Time  0345    PT Stop Time  0423    PT Time Calculation (min)  38 min       Past Medical History:  Diagnosis Date  . Allergic rhinitis   . Diabetes mellitus    TYPE 2  . Dyslipidemia   . Hyperkalemia   . Hypertension   . Insomnia    Pt stated no trouble falling or staying asleep.  . RA (rheumatoid arthritis) ([place])     Past Surgical History:  Procedure Laterality Date  . BREAST CYST EXCISION Right 1967  . HAND TENDON SURGERY     Right  . OVARIAN CYST SURGERY Right 1988    There were no vitals filed for this visit.  Subjective Assessment - 06/05/19 1548    Subjective  No pain now. I do have pain after each PT session that resolves after a day.    Currently in Pain?  No/denies                       [facility] Adult PT Treatment/Exercise - 06/05/19 0001      Shoulder Exercises: Standing   External Rotation  10 reps    Theraband Level (Shoulder External Rotation)  Level 1 (Yellow)    Internal Rotation  10 reps    Theraband Level (Shoulder Internal Rotation)  Level 1 (Yellow)    Extension  10 reps    Theraband Level (Shoulder Extension)  Level 1 (Yellow)    Row  15 reps    Theraband Level (Shoulder Row)  Level 1 (Yellow)    Other Standing Exercises  UE ranger reachin to wall and also from floor for IR AAROM, standing cane extension and IR (using UE ranger)       Shoulder Exercises: Pulleys   Flexion  2 minutes    ABduction  1 minute      Shoulder Exercises: ROM/Strengthening   UBE (Upper Arm  Bike)  forward and retro L! x 2 min each       Shoulder Exercises: Stretch   Corner Stretch Limitations  doorway stretch 3 x 30 sec              PT Education - 06/05/19 1637    Education Details  HEP    Person(s) Educated  Patient    Methods  Explanation;Handout    Comprehension  Verbalized understanding          PT Long Term Goals - 05/29/19 1223      PT LONG TERM GOAL #1   Title  She will be independent with all hEP issued    Status  On-going      PT LONG TERM GOAL #2   Title  She will be able to each to lower back with RT and LT hand to be able to dress and provide self care with less pain    Baseline  able to reach lower back with LT and thumb to lower back  on  LT    Status  Partially Met      PT LONG TERM GOAL #3   Title  She will report 50% decr pain in general    Status  On-going      PT LONG TERM GOAL #4   Title  FOTO score will decrease to  39 %  limited    Status  Unable to assess            Plan - 06/05/19 1613    Clinical Impression Statement  Increased pain after each PT session that subsides within a day. IR AROM still limited to left side. After AAROM with UE ranger, she was able to reach L spine today. She c/o left posterior lateral hip pain with standing exercises.    PT Next Visit Plan  assess tolerance to  strengthening HEP. Monitor hip pain that happens with standing; recheck IR    PT Home Exercise Plan  reaching behind back, door ER, wall slides for flexion, yellow band, row, extension IR, Er    Consulted and Agree with Plan of Care  Patient       Patient will benefit from skilled therapeutic intervention in order to improve the following deficits and impairments:  Pain, Impaired UE functional use, Decreased activity tolerance, Decreased range of motion  Visit Diagnosis: 1. Chronic left shoulder pain   2. Chronic right shoulder pain   3. Stiffness of right shoulder, not elsewhere classified   4. Stiffness of left shoulder, not  elsewhere classified        Problem List Patient Active Problem List   Diagnosis Date Noted  . Iron deficiency anemia 01/20/2019  . Encounter for long-term (current) use of other medications 02/29/2012  . Hypertension   . Diabetes mellitus   . Hypokalemia   . RA (rheumatoid arthritis) ([place])   . EDEMA 08/27/2010  . CARPAL TUNNEL SYNDROME, BILATERAL 10/28/2009  . NUMBNESS 10/28/2009  . Diabetes mellitus without complication ([place]) 61/96/9409  . Hyperlipidemia 09/19/2007  . ALLERGIC RHINITIS 09/19/2007  . INSOMNIA 09/19/2007    Dorene Ar, PTA 06/05/2019, 4:37 PM  [facility] [address] [place], [place], [address] Phone: [phone]   Fax:  [phone]  Name: April [name] MRN: [id number] Date of Birth: August 03, 1941

## 2019-06-06 ENCOUNTER — Other Ambulatory Visit: Payer: Self-pay | Admitting: Endocrinology

## 2019-06-06 ENCOUNTER — Other Ambulatory Visit (INDEPENDENT_AMBULATORY_CARE_PROVIDER_SITE_OTHER): Payer: HMO

## 2019-06-06 DIAGNOSIS — E119 Type 2 diabetes mellitus without complications: Secondary | ICD-10-CM | POA: Diagnosis not present

## 2019-06-06 LAB — HEMOGLOBIN A1C: Hgb A1c MFr Bld: 6.1 % (ref 4.6–6.5)

## 2019-06-06 LAB — BASIC METABOLIC PANEL
BUN: 22 mg/dL (ref 6–23)
CO2: 27 mEq/L (ref 19–32)
Calcium: 9.3 mg/dL (ref 8.4–10.5)
Chloride: 102 mEq/L (ref 96–112)
Creatinine, Ser: 1.19 mg/dL (ref 0.40–1.20)
GFR: 53.36 mL/min — ABNORMAL LOW (ref >60.00)
Glucose, Bld: 80 mg/dL (ref 70–99)
Potassium: 3.9 mEq/L (ref 3.5–5.1)
Sodium: 139 mEq/L (ref 135–145)

## 2019-06-12 ENCOUNTER — Ambulatory Visit: Payer: HMO | Admitting: Physical Therapy

## 2019-06-13 ENCOUNTER — Other Ambulatory Visit: Payer: Self-pay

## 2019-06-13 ENCOUNTER — Encounter: Payer: Self-pay | Admitting: Endocrinology

## 2019-06-13 ENCOUNTER — Ambulatory Visit (INDEPENDENT_AMBULATORY_CARE_PROVIDER_SITE_OTHER): Payer: HMO | Admitting: Endocrinology

## 2019-06-13 VITALS — BP 132/60 | HR 72 | Ht 64.0 in | Wt 164.0 lb

## 2019-06-13 DIAGNOSIS — E119 Type 2 diabetes mellitus without complications: Secondary | ICD-10-CM | POA: Diagnosis not present

## 2019-06-13 DIAGNOSIS — E876 Hypokalemia: Secondary | ICD-10-CM

## 2019-06-13 DIAGNOSIS — E782 Mixed hyperlipidemia: Secondary | ICD-10-CM

## 2019-06-13 NOTE — Patient Instructions (Signed)
Check blood sugars on waking up 3 days a week  Also check blood sugars about 2 hours after meals and do this after different meals by rotation  Recommended blood sugar levels on waking up are 90-130 and about 2 hours after meal is 130-160  Please bring your blood sugar monitor to each visit, thank you   

## 2019-06-13 NOTE — Progress Notes (Signed)
Patient ID: April [name], female   DOB: 1940-12-20, 78 y.o.   MRN: [id number]   Reason for Appointment: follow-up of various problems  History of Present Illness    HYPERTENSION:  diagnosed around 1979 with symptoms of headaches Previously she was taking Avapro, clonidine 0.6 at bedtime and Dyazide but blood pressure was relatively high with this regimen  For evaluation of her hyperaldosteronism she was switched to labetalol 100 mg twice a day, Tenex 2 mg and doxazosin With this regimen her blood pressure has been much better She was subsequently started on Aldactone to help with her severe hypokalemia  Her treatment regimen has been continued except her doxazosin was stopped previously by cardiologist when her blood pressure was relatively low  She is taking Aldactone and labetalol along with 12.5 mg hydrochlorothiazide   She has not checked her blood pressure at the drug store or at home recently  BP Readings from Last 3 Encounters:  06/13/19 132/60  02/11/19 (!) 152/88  01/14/19 (!) 143/79   RENAL function has been stable  Lab Results  Component Value Date   CREATININE 1.19 06/06/2019   CREATININE 1.13 02/07/2019   CREATININE 1.02 (H) 01/14/2019      HYPOKALEMIA:   This previously had been a chronic problem and associated with hypertension  Previously had  been prescribed 6 tablets of potassium daily She was off her diuretics and Avapro when her evaluation for hyperaldosteronism was done, aldosterone level was 6.0 along with a relatively low renin level  She has a normal potassium level consistently with Aldactone 100 mg daily along with 1 tablet of potassium 20 mEq daily  Lab Results  Component Value Date   K 3.9 06/06/2019     DIABETES type II:   She has had diabetes since at least 2015 This has been  well controlled with a regimen of metformin 1 g twice a day   A1c has been consistently upper normal, now 6.1%  Although she has gained  some weight recently this is likely to be from her difficulty with trying to walk lately and is not able to do as much Diet has been variable No side effects from metformin and she has not needed additional treatment in the past  She checks her blood sugars mostly fasting and rarely in the afternoon or midday  GLUCOSE readings for the last month on her Accu-Chek download:   Average 104 Blood sugar range 95-117   Weight history:   Wt Readings from Last 3 Encounters:  06/13/19 164 lb (74.4 kg)  02/11/19 161 lb (73 kg)  01/14/19 162 lb 11.2 oz (73.8 kg)      Lab Results  Component Value Date   HGBA1C 6.1 06/06/2019   HGBA1C 5.9 02/07/2019   HGBA1C 6.1 10/04/2018   Lab Results  Component Value Date   MICROALBUR 2.0 (H) 02/07/2019   LDLCALC 73 06/25/2018   CREATININE 1.19 06/06/2019    OTHER active problems discussed today are in review of systems  LABS:  No visits with results within 1 Week(s) from this visit.  Latest known visit with results is:  Lab on 06/06/2019  Component Date Value Ref Range Status  . Sodium 06/06/2019 139  135 - 145 mEq/L Final  . Potassium 06/06/2019 3.9  3.5 - 5.1 mEq/L Final  . Chloride 06/06/2019 102  96 - 112 mEq/L Final  . CO2 06/06/2019 27  19 - 32 mEq/L Final  . Glucose, Bld 06/06/2019 80  70 -  99 mg/dL Final  . BUN 06/06/2019 22  6 - 23 mg/dL Final  . Creatinine, Ser 06/06/2019 1.19  0.40 - 1.20 mg/dL Final  . Calcium 06/06/2019 9.3  8.4 - 10.5 mg/dL Final  . GFR 06/06/2019 53.36* >60.00 mL/min Final  . Hgb A1c MFr Bld 06/06/2019 6.1  4.6 - 6.5 % Final   Glycemic Control Guidelines for People with Diabetes:Non Diabetic:  <6%Goal of Therapy: <7%Additional Action Suggested:  >8%     Allergies as of 06/13/2019      Reactions   Acetaminophen Nausea Only   Actos [pioglitazone Hydrochloride]    edema   Aspirin Nausea Only   Atorvastatin Other (See Comments)   Felt drunk/high floating   Morphine Sulfate Other (See Comments)   Burns  injecting "arm is on fire"   Naproxen Sodium Nausea Only   Sitagliptin Phosphate Nausea And Vomiting      Medication List       Accurate as of June 13, 2019  9:10 AM. If you have any questions, ask your nurse or doctor.        Accu-Chek Compact Plus test strip Generic drug: glucose blood Use to check blood sugar once a day dx code E11.9   Accu-Chek Softclix Lancets lancets Use to check blood sugar once a day dx code E11.9   amitriptyline 25 MG tablet Commonly known as: ELAVIL Take 25 mg by mouth at bedtime.   ezetimibe 10 MG tablet Commonly known as: ZETIA Take 10 mg by mouth daily.   Ferrex 150 150 MG capsule Generic drug: iron polysaccharides TAKE 1 CAPSULE BY MOUTH TWICE A DAY   folic acid 0.5 MG tablet Commonly known as: FOLVITE Take 0.5 mg by mouth daily.   gabapentin 300 MG capsule Commonly known as: NEURONTIN Take 300 mg by mouth daily.   Ginger 500 MG Caps Take 1 capsule by mouth daily.   hydrochlorothiazide 12.5 MG capsule Commonly known as: MICROZIDE Take 1 capsule by mouth once daily   Klor-Con M20 20 MEQ tablet Generic drug: potassium chloride SA TAKE 1 TABLET BY MOUTH DAILY.   labetalol 100 MG tablet Commonly known as: NORMODYNE TAKE 1 TABLET BY MOUTH TWICE A DAY   metFORMIN 1000 MG tablet Commonly known as: GLUCOPHAGE TAKE 1 TABLET (1,000 MG TOTAL) BY MOUTH 2 (TWO) TIMES DAILY.   methotrexate 2.5 MG tablet Commonly known as: RHEUMATREX Take 15 mg by mouth once a week. Patient takes 3 tablets twice once a week to add up to be 6 tablets once weekly, starts with 2 tablets in the evening and takes 2 more tablets in the morning   montelukast 10 MG tablet Commonly known as: SINGULAIR Take 10 mg by mouth daily.   NONFORMULARY OR COMPOUNDED [place] compound:  Peripheral Neuropathy cream - Bu[ovacaome 1%, Doxepin 3%, Gabapentin 6%, Pentoxifylline 3%, Topiramate 1%, dispense 120grams, apply 1-2 grams to affected area 3-4 times  daily, + 3 refills.   Nucynta 50 MG tablet Generic drug: tapentadol Take 50 mg by mouth as needed for severe pain (spasms).   pravastatin 80 MG tablet Commonly known as: PRAVACHOL TAKE 1 TABLET BY MOUTH DAILY   spironolactone 100 MG tablet Commonly known as: ALDACTONE TAKE 1 TABLET BY MOUTH EVERY DAY   Turmeric 450 MG Caps Take 1 capsule by mouth daily.   vitamin B-12 1000 MCG tablet Commonly known as: CYANOCOBALAMIN Take 1,000 mcg by mouth daily.       Allergies:  Allergies  Allergen Reactions  .  Acetaminophen Nausea Only  . Actos [Pioglitazone Hydrochloride]     edema  . Aspirin Nausea Only  . Atorvastatin Other (See Comments)    Felt drunk/high floating  . Morphine Sulfate Other (See Comments)    Burns injecting "arm is on fire"  . Naproxen Sodium Nausea Only  . Sitagliptin Phosphate Nausea And Vomiting    Past Medical History:  Diagnosis Date  . Allergic rhinitis   . Diabetes mellitus    TYPE 2  . Dyslipidemia   . Hyperkalemia   . Hypertension   . Insomnia    Pt stated no trouble falling or staying asleep.  . RA (rheumatoid arthritis) ([facility])     Past Surgical History:  Procedure Laterality Date  . BREAST CYST EXCISION Right 1967  . HAND TENDON SURGERY     Right  . OVARIAN CYST SURGERY Right 1988    Family History  Problem Relation Age of Onset  . Diabetes Mother   . Diabetes Father   . Cancer Father        Prostate  . Diabetes Sister   . Stroke Maternal Grandmother        Cause of death  . Healthy Brother     Social History:  reports that she has never smoked. She has never used smokeless tobacco. She reports that she does not drink alcohol or use drugs.  Review of Systems:   History of rheumatoid arthritis, continues on  Methotrexate   Vitamin D deficiency: Currently taking vitamin D3, 1000 U daily  LIPIDS: Taking Zetia from PCP and is on pravastatin 80 mg No recent labs available  No history of liver dysfunction  Lab Results   Component Value Date   CHOL 149 06/25/2018   HDL 66.60 06/25/2018   LDLCALC 73 06/25/2018   TRIG 45.0 06/25/2018   CHOLHDL 2 06/25/2018   Lab Results  Component Value Date   ALT 17 02/07/2019   ALT 14 01/14/2019   ALT 23 09/11/2017      Examination:   BP 132/60 (BP Location: Left Arm, Patient Position: Sitting, Cuff Size: Normal)   Pulse 72   Ht 5\' 4"  (1.626 m)   Wt 164 lb (74.4 kg)   SpO2 96%   BMI 28.15 kg/m   Body mass index is 28.15 kg/m.      Assesment/PLAN:  Hypertension:  Blood pressure is consistently well controlled   She is on a 3 drug regimen of  Aldactone 100 mg, labetalol and  HCTZ   RENAL dysfunction: Her creatinine is normal   Hypokalemia: She has a consistently normal potassium level with 100 mg Aldactone and also with only 1 tablet of potassium  Likely has renal potassium wasting and not hyperaldosteronism causing hypokalemia She will continue the same regimen   DIABETES: Well controlled and A1c is consistently in the upper normal range with metformin alone A1c 6.1 and home blood sugars are fairly good also  She still has difficulty doing much exercise  Weight is slightly higher Discussed checking readings after meals and also being consistent on diet  HYPERCHOLESTEROLEMIA: She will follow-up with PCP   She will follow-up in 4 months   There are no Patient Instructions on file for this visit.   Reather Littler 06/13/2019, 9:10 AM

## 2019-06-14 ENCOUNTER — Ambulatory Visit: Payer: HMO | Admitting: Physical Therapy

## 2019-06-17 ENCOUNTER — Other Ambulatory Visit: Payer: Self-pay

## 2019-06-17 ENCOUNTER — Ambulatory Visit: Payer: HMO | Admitting: Physical Therapy

## 2019-06-17 ENCOUNTER — Encounter: Payer: Self-pay | Admitting: Physical Therapy

## 2019-06-17 DIAGNOSIS — G8929 Other chronic pain: Secondary | ICD-10-CM

## 2019-06-17 DIAGNOSIS — M25512 Pain in left shoulder: Secondary | ICD-10-CM | POA: Diagnosis not present

## 2019-06-17 DIAGNOSIS — M25611 Stiffness of right shoulder, not elsewhere classified: Secondary | ICD-10-CM

## 2019-06-17 DIAGNOSIS — M25612 Stiffness of left shoulder, not elsewhere classified: Secondary | ICD-10-CM

## 2019-06-17 NOTE — Therapy (Signed)
[place], [place], [address] Phone: [phone]   Fax:  [phone]  Physical Therapy Treatment  Patient Details  Name: April [name] MRN: [id number] Date of Birth: Apr 01, 1941 Referring Provider (PT): Sabino Niemann, MD   Encounter Date: 06/17/2019  PT End of Session - 06/17/19 0836    Visit Number  6    Number of Visits  12    Date for PT Re-Evaluation  06/28/19    PT Start Time  0830    PT Stop Time  0915    PT Time Calculation (min)  45 min       Past Medical History:  Diagnosis Date  . Allergic rhinitis   . Diabetes mellitus    TYPE 2  . Dyslipidemia   . Hyperkalemia   . Hypertension   . Insomnia    Pt stated no trouble falling or staying asleep.  . RA (rheumatoid arthritis) ([place])     Past Surgical History:  Procedure Laterality Date  . BREAST CYST EXCISION Right 1967  . HAND TENDON SURGERY     Right  . OVARIAN CYST SURGERY Right 1988    There were no vitals filed for this visit.  Subjective Assessment - 06/17/19 0838    Subjective  The pain was so bad the days after last session. I had to cancel my session.    Currently in Pain?  No/denies   No pain in shoulders, just sore.        [facility] PT Assessment - 06/17/19 0001      Observation/Other Assessments   Focus on Therapeutic Outcomes (FOTO)   42% limited       AROM   Right Shoulder Flexion  155 Degrees    Right Shoulder Internal Rotation  --   reach to T-12   Right Shoulder External Rotation  --   reach t2   Left Shoulder Flexion  132 Degrees    Left Shoulder ABduction  130 Degrees    Left Shoulder Internal Rotation  --   Reach to sacrum   Left Shoulder External Rotation  --   reach t2                  [facility] Adult PT Treatment/Exercise - 06/17/19 0001      Shoulder Exercises: Standing   External Rotation  10 reps    Theraband Level (Shoulder External Rotation)  Level 1 (Yellow)    Internal  Rotation  10 reps    Theraband Level (Shoulder Internal Rotation)  Level 1 (Yellow)    Extension  10 reps    Theraband Level (Shoulder Extension)  Level 1 (Yellow)    Row  15 reps    Theraband Level (Shoulder Row)  Level 1 (Yellow)    Other Standing Exercises  UE ranger standing cane       Shoulder Exercises: Pulleys   Flexion  2 minutes      Shoulder Exercises: ROM/Strengthening   Nustep  L3 UE/LE x 8 min while discussing progres                   PT Long Term Goals - 06/17/19 1034      PT LONG TERM GOAL #1   Title  She will be independent with all hEP issued    Time  4    Period  Weeks    Status  On-going      PT LONG TERM GOAL #2  Title  She will be able to each to lower back with RT and LT hand to be able to dress and provide self care with less pain    Baseline  able to reach lower back with LT and thumb to lower back  on LT, inconsistent    Time  4    Period  Weeks    Status  Partially Met      PT LONG TERM GOAL #3   Title  She will report 50% decr pain in general    Baseline  no overall change per patient    Time  4    Period  Weeks    Status  On-going      PT LONG TERM GOAL #4   Title  FOTO score will decrease to  39 %  limited    Baseline  42% limited    Time  4    Period  Weeks    Status  On-going            Plan - 06/17/19 0955    Clinical Impression Statement  Pt reports increased shoulder,lumbar, bilateral hip pain for 2 days after last session and had to cancel her last appointment. She reports pain always worse after PT sessions for 1-2 days. FOTO score improved from 49% to 42% limited. OVerall she reports no improvement in bilateral shoulder with PT. She reports other co-morbidities affect her daily pain including OA, RA, and osteoporosis.She reports increased hip/lumbar pain with standing and with supine/hooklying so attempted mostly seated therex today and short standing therex.    PT Next Visit Plan  assess tolerance to   strengthening HEP. Monitor hip pain that happens with standing; recheck IR    PT Home Exercise Plan  reaching behind back, door ER, wall slides for flexion, yellow band, row, extension IR, Er    Consulted and Agree with Plan of Care  Patient       Patient will benefit from skilled therapeutic intervention in order to improve the following deficits and impairments:  Pain, Impaired UE functional use, Decreased activity tolerance, Decreased range of motion  Visit Diagnosis: 1. Chronic left shoulder pain   2. Chronic right shoulder pain   3. Stiffness of right shoulder, not elsewhere classified   4. Stiffness of left shoulder, not elsewhere classified        Problem List Patient Active Problem List   Diagnosis Date Noted  . Iron deficiency anemia 01/20/2019  . Encounter for long-term (current) use of other medications 02/29/2012  . Hypertension   . Diabetes mellitus   . Hypokalemia   . RA (rheumatoid arthritis) ([place])   . EDEMA 08/27/2010  . CARPAL TUNNEL SYNDROME, BILATERAL 10/28/2009  . NUMBNESS 10/28/2009  . Diabetes mellitus without complication ([place]) 33/29/5188  . Hyperlipidemia 09/19/2007  . ALLERGIC RHINITIS 09/19/2007  . INSOMNIA 09/19/2007    Dorene Ar , PTA 06/17/2019, 12:49 PM  [facility] [address] [place], [place], [address] Phone: [phone]   Fax:  [phone]  Name: April [name] MRN: [id number] Date of Birth: Oct 03, 1941

## 2019-06-19 ENCOUNTER — Other Ambulatory Visit: Payer: Self-pay

## 2019-06-19 ENCOUNTER — Ambulatory Visit: Payer: HMO

## 2019-06-19 DIAGNOSIS — M25511 Pain in right shoulder: Secondary | ICD-10-CM

## 2019-06-19 DIAGNOSIS — M25611 Stiffness of right shoulder, not elsewhere classified: Secondary | ICD-10-CM

## 2019-06-19 DIAGNOSIS — M25512 Pain in left shoulder: Secondary | ICD-10-CM | POA: Diagnosis not present

## 2019-06-19 DIAGNOSIS — G8929 Other chronic pain: Secondary | ICD-10-CM

## 2019-06-19 DIAGNOSIS — M25612 Stiffness of left shoulder, not elsewhere classified: Secondary | ICD-10-CM

## 2019-06-19 NOTE — Therapy (Signed)
[place], [place], [address] Phone: [phone]   Fax:  [phone]  Physical Therapy Treatment  Patient Details  Name: April [name] MRN: [id number] Date of Birth: 1941/02/22 Referring Provider (PT): Sabino Niemann, MD   Encounter Date: 06/19/2019  PT End of Session - 06/19/19 0859    Visit Number  7    Number of Visits  12    Date for PT Re-Evaluation  06/28/19    Authorization Type  [employer]    Authorization Time Period  progess at 10th visit KX at 15    PT Start Time  0859   PT late   PT Stop Time  0930    PT Time Calculation (min)  31 min    Activity Tolerance  Patient tolerated treatment well;Patient limited by pain    Behavior During Therapy  [facility] for tasks assessed/performed       Past Medical History:  Diagnosis Date  . Allergic rhinitis   . Diabetes mellitus    TYPE 2  . Dyslipidemia   . Hyperkalemia   . Hypertension   . Insomnia    Pt stated no trouble falling or staying asleep.  . RA (rheumatoid arthritis) ([place])     Past Surgical History:  Procedure Laterality Date  . BREAST CYST EXCISION Right 1967  . HAND TENDON SURGERY     Right  . OVARIAN CYST SURGERY Right 1988    There were no vitals filed for this visit.  Subjective Assessment - 06/19/19 0904    Subjective  Some pain most in LT lower back and Lt shoulder  mild in RT.   PAin in LT back but pain in RT back a day or so ago. LBP for a week.    Pain Score  7     Pain Location  Back    Pain Orientation  Left    Pain Descriptors / Indicators  Aching    Pain Type  Chronic pain    Pain Onset  More than a month ago                       [facility] Adult PT Treatment/Exercise - 06/19/19 0001      Shoulder Exercises: Standing   Protraction  Right;Left;10 reps    Theraband Level (Shoulder Protraction)  Level 2 (Red)    Protraction Limitations  punch forward    External Rotation  10  reps;Right;Left    Theraband Level (Shoulder External Rotation)  Level 2 (Red)    Internal Rotation  10 reps;Right;Left    Theraband Level (Shoulder Internal Rotation)  Level 2 (Red)    Row  10 reps;Right;Left    Theraband Level (Shoulder Row)  Level 2 (Red)      Shoulder Exercises: ROM/Strengthening   UBE (Upper Arm Bike)  L1 2 min for and 2 min back    Ranger  flexion 2x10 RT/LT   circles x 10 clock and counter clock, RT/LT                   PT Long Term Goals - 06/17/19 1034      PT LONG TERM GOAL #1   Title  She will be independent with all hEP issued    Time  4    Period  Weeks    Status  On-going      PT LONG TERM GOAL #2   Title  She will be  able to each to lower back with RT and LT hand to be able to dress and provide self care with less pain    Baseline  able to reach lower back with LT and thumb to lower back  on LT, inconsistent    Time  4    Period  Weeks    Status  Partially Met      PT LONG TERM GOAL #3   Title  She will report 50% decr pain in general    Baseline  no overall change per patient    Time  4    Period  Weeks    Status  On-going      PT LONG TERM GOAL #4   Title  FOTO score will decrease to  39 %  limited    Baseline  42% limited    Time  4    Period  Weeks    Status  On-going            Plan - 06/19/19 0900    Clinical Impression Statement  Shoulders feel about the same post session.  She continues to discuss lower back pain I asked her to be more specific about how her shoulder feel post session and to let us know if back pain incr during session so we can change position. Lt IR only to LT SI joint    PT Frequency  2x / week    PT Duration  2 weeks    PT Treatment/Interventions  Taping;Passive range of motion;Manual techniques;Patient/family education;Therapeutic exercise;Iontophoresis 38m/ml Dexamethasone;Moist Heat;Ultrasound    PT Next Visit Plan  assess tolerance to  strengthening HEP. Monitor hip pain that happens with  standing; recheck IR    PT Home Exercise Plan  reaching behind back, door ER, wall slides for flexion, yellow band, row, extension IR, Er    Consulted and Agree with Plan of Care  Patient       Patient will benefit from skilled therapeutic intervention in order to improve the following deficits and impairments:  Pain, Impaired UE functional use, Decreased activity tolerance, Decreased range of motion  Visit Diagnosis: 1. Chronic left shoulder pain   2. Chronic right shoulder pain   3. Stiffness of right shoulder, not elsewhere classified   4. Stiffness of left shoulder, not elsewhere classified        Problem List Patient Active Problem List   Diagnosis Date Noted  . Iron deficiency anemia 01/20/2019  . Encounter for long-term (current) use of other medications 02/29/2012  . Hypertension   . Diabetes mellitus   . Hypokalemia   . [place]) [place]   . EDEMA 08/27/2010  . CARPAL TUNNEL SYNDROME, BILATERAL 10/28/2009  . NUMBNESS 10/28/2009  . Diabetes mellitus without complication ([place] 116/60/6301 . Hyperlipidemia 09/19/2007  . ALLERGIC RHINITIS 09/19/2007  . INSOMNIA 09/19/2007     PT 06/19/2019, 10:04 AM  [place] [address]: [phone]  Fax:  [phone] Name: April WORDMRN: [id number] of Birth: [birth year]

## 2019-06-24 ENCOUNTER — Other Ambulatory Visit: Payer: Self-pay

## 2019-06-24 ENCOUNTER — Ambulatory Visit: Payer: HMO | Admitting: Physical Therapy

## 2019-06-24 ENCOUNTER — Encounter: Payer: Self-pay | Admitting: Physical Therapy

## 2019-06-24 DIAGNOSIS — G8929 Other chronic pain: Secondary | ICD-10-CM

## 2019-06-24 DIAGNOSIS — D638 Anemia in other chronic diseases classified elsewhere: Secondary | ICD-10-CM | POA: Diagnosis not present

## 2019-06-24 DIAGNOSIS — M25512 Pain in left shoulder: Secondary | ICD-10-CM | POA: Diagnosis not present

## 2019-06-24 DIAGNOSIS — M25611 Stiffness of right shoulder, not elsewhere classified: Secondary | ICD-10-CM

## 2019-06-24 DIAGNOSIS — Z0000 Encounter for general adult medical examination without abnormal findings: Secondary | ICD-10-CM | POA: Diagnosis not present

## 2019-06-24 DIAGNOSIS — J309 Allergic rhinitis, unspecified: Secondary | ICD-10-CM | POA: Diagnosis not present

## 2019-06-24 DIAGNOSIS — M25612 Stiffness of left shoulder, not elsewhere classified: Secondary | ICD-10-CM

## 2019-06-24 DIAGNOSIS — M25511 Pain in right shoulder: Secondary | ICD-10-CM

## 2019-06-24 DIAGNOSIS — E7800 Pure hypercholesterolemia, unspecified: Secondary | ICD-10-CM | POA: Diagnosis not present

## 2019-06-24 NOTE — Therapy (Signed)
[place], [place], [address] Phone: [phone]   Fax:  [phone]  Physical Therapy Treatment  Patient Details  Name: April [name] MRN: [id number] Date of Birth: 04/04/1941 Referring Provider (PT): Sabino Niemann, MD   Encounter Date: 06/24/2019  PT End of Session - 06/24/19 1048    Visit Number  8    Number of Visits  12    Date for PT Re-Evaluation  06/28/19    PT Start Time  6734    PT Stop Time  1127    PT Time Calculation (min)  42 min       Past Medical History:  Diagnosis Date  . Allergic rhinitis   . Diabetes mellitus    TYPE 2  . Dyslipidemia   . Hyperkalemia   . Hypertension   . Insomnia    Pt stated no trouble falling or staying asleep.  . RA (rheumatoid arthritis) ([place])     Past Surgical History:  Procedure Laterality Date  . BREAST CYST EXCISION Right 1967  . HAND TENDON SURGERY     Right  . OVARIAN CYST SURGERY Right 1988    There were no vitals filed for this visit.  Subjective Assessment - 06/24/19 1047    Subjective  I feel  much better now than the last 2 weeks. I used biofreezre.    Currently in Pain?  No/denies                       [facility] Adult PT Treatment/Exercise - 06/24/19 0001      Shoulder Exercises: Standing   Protraction  15 reps    Theraband Level (Shoulder Protraction)  Level 2 (Red)    Protraction Limitations  punch forward    External Rotation  15 reps    Theraband Level (Shoulder External Rotation)  Level 2 (Red)    Internal Rotation  10 reps    Theraband Level (Shoulder Internal Rotation)  Level 2 (Red)    Extension  15 reps    Theraband Level (Shoulder Extension)  Level 2 (Red)    Row  15 reps    Theraband Level (Shoulder Row)  Level 2 (Red)    Other Standing Exercises  above performed seated on stool due to LBP       Shoulder Exercises: Pulleys   Flexion  2 minutes    Scaption  2 minutes      Shoulder Exercises:  ROM/Strengthening   UBE (Upper Arm Bike)  L1 3 min for and 3 min back    [place]  flexion 2x10 RT/LT   circles x 10 clock and counter clock, RT/LT , IR reach around from floor x 10 each     Other ROM/Strengthening Exercises  standing cane extension AAROM and IR AAROM      Shoulder Exercises: Stretch   Corner Stretch Limitations  doorway stretch 3 x 30 sec                   PT Long Term Goals - 06/17/19 1034      PT LONG TERM GOAL #1   Title  She will be independent with all hEP issued    Time  4    Period  Weeks    Status  On-going      PT LONG TERM GOAL #2   Title  She will be able to each to lower back with RT and LT hand to be  able to dress and provide self care with less pain    Baseline  able to reach lower back with LT and thumb to lower back  on LT, inconsistent    Time  4    Period  Weeks    Status  Partially Met      PT LONG TERM GOAL #3   Title  She will report 50% decr pain in general    Baseline  no overall change per patient    Time  4    Period  Weeks    Status  On-going      PT LONG TERM GOAL #4   Title  FOTO score will decrease to  39 %  limited    Baseline  42% limited    Time  4    Period  Weeks    Status  On-going            Plan - 06/24/19 1124    Clinical Impression Statement  Pt reports improvement in pain after treating her multiple pain areas with bio freeze. She tolerated session well today. Used a stool for theraband strengthening to prevent LBP. IR still limited on left.    PT Next Visit Plan  assess tolerance to  strengthening HEP. Monitor hip pain that happens with standing; recheck IR    PT Home Exercise Plan  reaching behind back, door ER, wall slides for flexion, yellow band, row, extension IR, Er       Patient will benefit from skilled therapeutic intervention in order to improve the following deficits and impairments:     Visit Diagnosis: 1. Chronic left shoulder pain   2. Chronic right shoulder pain   3. Stiffness  of right shoulder, not elsewhere classified   4. Stiffness of left shoulder, not elsewhere classified        Problem List Patient Active Problem List   Diagnosis Date Noted  . Iron deficiency anemia 01/20/2019  . Encounter for long-term (current) use of other medications 02/29/2012  . Hypertension   . Diabetes mellitus   . Hypokalemia   . RA (rheumatoid arthritis) ([place])   . EDEMA 08/27/2010  . CARPAL TUNNEL SYNDROME, BILATERAL 10/28/2009  . NUMBNESS 10/28/2009  . Diabetes mellitus without complication ([place]) 20/94/7096  . Hyperlipidemia 09/19/2007  . ALLERGIC RHINITIS 09/19/2007  . INSOMNIA 09/19/2007    Dorene Ar, PTA 06/24/2019, 11:42 AM  [facility] [address] [place], [place], [address] Phone: [phone]   Fax:  [phone]  Name: April [name] MRN: [id number] Date of Birth: 07-08-1941

## 2019-06-26 ENCOUNTER — Ambulatory Visit: Payer: HMO | Admitting: Physical Therapy

## 2019-06-26 ENCOUNTER — Other Ambulatory Visit: Payer: Self-pay

## 2019-06-26 ENCOUNTER — Encounter: Payer: Self-pay | Admitting: Physical Therapy

## 2019-06-26 DIAGNOSIS — M25512 Pain in left shoulder: Secondary | ICD-10-CM | POA: Diagnosis not present

## 2019-06-26 DIAGNOSIS — M25612 Stiffness of left shoulder, not elsewhere classified: Secondary | ICD-10-CM

## 2019-06-26 DIAGNOSIS — G8929 Other chronic pain: Secondary | ICD-10-CM

## 2019-06-26 DIAGNOSIS — M25611 Stiffness of right shoulder, not elsewhere classified: Secondary | ICD-10-CM

## 2019-06-26 DIAGNOSIS — M25511 Pain in right shoulder: Secondary | ICD-10-CM

## 2019-06-26 NOTE — Therapy (Signed)
[place], [place], [address] Phone: [phone]   Fax:  [phone]  Physical Therapy Treatment  Patient Details  Name: April [name] MRN: [id number] Date of Birth: 1941-07-23 Referring Provider (PT): Sabino Niemann, MD   Encounter Date: 06/26/2019  PT End of Session - 06/26/19 0840    Visit Number  9    Number of Visits  12    Date for PT Re-Evaluation  06/28/19    Authorization Type  Health team advantage    Authorization Time Period  progess at 10th visit KX at 15    PT Start Time  0837    PT Stop Time  0915    PT Time Calculation (min)  38 min       Past Medical History:  Diagnosis Date  . Allergic rhinitis   . Diabetes mellitus    TYPE 2  . Dyslipidemia   . Hyperkalemia   . Hypertension   . Insomnia    Pt stated no trouble falling or staying asleep.  . RA (rheumatoid arthritis) ([place])     Past Surgical History:  Procedure Laterality Date  . BREAST CYST EXCISION Right 1967  . HAND TENDON SURGERY     Right  . OVARIAN CYST SURGERY Right 1988    There were no vitals filed for this visit.  Subjective Assessment - 06/26/19 0839    Subjective  5/10 left shoulder.    Currently in Pain?  Yes    Pain Score  5     Pain Location  Shoulder    Pain Orientation  Left    Pain Descriptors / Indicators  Aching    Aggravating Factors   nothing in particular    Pain Relieving Factors  heat, cream, injection                       [facility] Adult PT Treatment/Exercise - 06/26/19 0001      Shoulder Exercises: Supine   Horizontal ABduction  15 reps    Theraband Level (Shoulder Horizontal ABduction)  Level 1 (Yellow)    Diagonals  Left;15 reps    Theraband Level (Shoulder Diagonals)  Level 1 (Yellow)      Shoulder Exercises: Standing   Protraction  15 reps    Theraband Level (Shoulder Protraction)  Level 2 (Red)    Protraction Limitations  punch forward    External Rotation   15 reps    Theraband Level (Shoulder External Rotation)  Level 2 (Red)    Internal Rotation  15 reps    Theraband Level (Shoulder Internal Rotation)  Level 2 (Red)    Extension  15 reps    Theraband Level (Shoulder Extension)  Level 2 (Red)    Row  15 reps    Theraband Level (Shoulder Row)  Level 2 (Red)    Other Standing Exercises  abover performed in standing today , monitored for pain increase       Shoulder Exercises: Pulleys   Flexion  2 minutes    Scaption  2 minutes      Shoulder Exercises: ROM/Strengthening   UBE (Upper Arm Bike)  L1.5 3 min for and 3 min back      Shoulder Exercises: Stretch   Corner Stretch Limitations  doorway stretch 3 x 30 sec     Internal Rotation Stretch Limitations  towel stretch x 2 , then towel roll in in axilla x 2  PT Long Term Goals - 06/26/19 0847      PT LONG TERM GOAL #1   Title  She will be independent with all hEP issued    Time  4    Period  Weeks    Status  Achieved      PT LONG TERM GOAL #2   Title  She will be able to each to lower back with RT and LT hand to be able to dress and provide self care with less pain    Baseline  needs assist from opp arm    Time  4    Period  Weeks    Status  On-going      PT LONG TERM GOAL #3   Title  She will report 50% decr pain in general    Baseline  50% improvement per patient- 06/26/2019    Time  4    Period  Weeks    Status  Achieved      PT LONG TERM GOAL #4   Title  FOTO score will decrease to  39 %  limited    Baseline  42% limited    Time  4    Period  Weeks    Status  On-going            Plan - 06/26/19 0845    Clinical Impression Statement  Pt reports 50% improvement in left shoulder pain since beginning PT. LTG# 3 met. Continues to be stiff in IR upon arrival and improved by end of session. Added self mob for IR and ROM improved to reach to upper lumbar following. Updated HEP with towel mob.    PT Next Visit Plan  assess tolerance to   strengthening HEP. Monitor hip pain that happens with standing; recheck IR, review self mob with towel for IR    PT Home Exercise Plan  reaching behind back, door ER, wall slides for flexion, yellow band, row, extension IR, Er, towel in axilla slef mob for IR       Patient will benefit from skilled therapeutic intervention in order to improve the following deficits and impairments:  Pain, Impaired UE functional use, Decreased activity tolerance, Decreased range of motion  Visit Diagnosis: 1. Chronic left shoulder pain   2. Chronic right shoulder pain   3. Stiffness of right shoulder, not elsewhere classified   4. Stiffness of left shoulder, not elsewhere classified        Problem List Patient Active Problem List   Diagnosis Date Noted  . Iron deficiency anemia 01/20/2019  . Encounter for long-term (current) use of other medications 02/29/2012  . Hypertension   . Diabetes mellitus   . Hypokalemia   . RA (rheumatoid arthritis) ([place])   . EDEMA 08/27/2010  . CARPAL TUNNEL SYNDROME, BILATERAL 10/28/2009  . NUMBNESS 10/28/2009  . Diabetes mellitus without complication ([place]) 82/64/1583  . Hyperlipidemia 09/19/2007  . ALLERGIC RHINITIS 09/19/2007  . INSOMNIA 09/19/2007    Dorene Ar , PTA 06/26/2019, 9:17 AM  [place], [place], [address] Phone: [phone]   Fax:  [phone]  Name: April [name] MRN: [id number] Date of Birth: 12-16-1940

## 2019-06-28 DIAGNOSIS — D638 Anemia in other chronic diseases classified elsewhere: Secondary | ICD-10-CM | POA: Diagnosis not present

## 2019-06-28 DIAGNOSIS — E7800 Pure hypercholesterolemia, unspecified: Secondary | ICD-10-CM | POA: Diagnosis not present

## 2019-06-28 DIAGNOSIS — J309 Allergic rhinitis, unspecified: Secondary | ICD-10-CM | POA: Diagnosis not present

## 2019-07-02 ENCOUNTER — Ambulatory Visit: Payer: HMO

## 2019-07-02 ENCOUNTER — Other Ambulatory Visit: Payer: Self-pay

## 2019-07-02 DIAGNOSIS — M25512 Pain in left shoulder: Secondary | ICD-10-CM | POA: Diagnosis not present

## 2019-07-02 DIAGNOSIS — G8929 Other chronic pain: Secondary | ICD-10-CM

## 2019-07-02 DIAGNOSIS — M25612 Stiffness of left shoulder, not elsewhere classified: Secondary | ICD-10-CM

## 2019-07-02 DIAGNOSIS — M542 Cervicalgia: Secondary | ICD-10-CM

## 2019-07-02 DIAGNOSIS — M25611 Stiffness of right shoulder, not elsewhere classified: Secondary | ICD-10-CM

## 2019-07-02 NOTE — Therapy (Signed)
[facility] [address] [place], [place], [address] Phone: [phone]   Fax:  [phone]  Physical Therapy Treatment  Patient Details  Name: April [name] MRN: [id number] Date of Birth: Feb 25, 1941 Referring Provider (PT): Haydee Salterauseef Syed, MD  Progress Note Reporting Period  05/21/19  to 07/02/19  See note below for Objective Data and Assessment of Progress/Goals.      Encounter Date: 07/02/2019  PT End of Session - 07/02/19 0943    Visit Number  10    Number of Visits  16    Date for PT Re-Evaluation  07/19/19    Authorization Type  Health team advantage    Authorization Time Period  progess at 10th visit KX at 15    PT Start Time  0945    PT Stop Time  1030    PT Time Calculation (min)  45 min    Activity Tolerance  Patient tolerated treatment well    Behavior During Therapy  [facility] for tasks assessed/performed       Past Medical History:  Diagnosis Date  . Allergic rhinitis   . Diabetes mellitus    TYPE 2  . Dyslipidemia   . Hyperkalemia   . Hypertension   . Insomnia    Pt stated no trouble falling or staying asleep.  . RA (rheumatoid arthritis) ([facility])     Past Surgical History:  Procedure Laterality Date  . BREAST CYST EXCISION Right 1967  . HAND TENDON SURGERY     Right  . OVARIAN CYST SURGERY Right 1988    There were no vitals filed for this visit.  Subjective Assessment - 07/02/19 0947    Subjective  Pain incr today . Has done HEP. HEP discomfort at times but does her best .  Pain started yesterday in traps and ascapul neck to shoulder    Pain Score  6     Pain Location  Scapula    Pain Orientation  Left    Pain Descriptors / Indicators  Aching    Pain Type  Chronic pain    Pain Onset  More than a month ago    Pain Frequency  Constant    Aggravating Factors   nothing specific    Pain Relieving Factors  heat , biofreeze, injections         [facility] PT Assessment - 07/02/19 0001      AROM    AROM Assessment Site  Cervical    Cervical Extension  40    Cervical - Right Side Bend  30    Cervical - Left Side Bend  20   lt neck pain   Cervical - Right Rotation  70    Cervical - Left Rotation  45   Lt neck pain     PROM   Overall PROM Comments  neck rotation no pain good ROM . RT neck side bend  painful pull on LT.  Lt neck rotation /side bend /ext all give RT neck and shoulder pain.       Palpation   Palpation comment  Tenderness Lt traps and cervical paraspinals most tender Lt traps and incr pain with neck LT sidebend and rotation Lt                    [facility] Adult PT Treatment/Exercise - 07/02/19 0001      Shoulder Exercises: ROM/Strengthening   UBE (Upper Arm Bike)  L 2 3 min for and 3 min back  Moist Heat Therapy   Number Minutes Moist Heat  12 Minutes    Moist Heat Location  Cervical      Manual Therapy   Manual Therapy  Joint mobilization;Soft tissue mobilization;Myofascial release;Manual Traction    Joint Mobilization  gentle PA to cervical spine.     Myofascial Release  LT neck paraspinals and  taps and levator    Passive ROM  RT side bend and rotation    Manual Traction  neck             PT Education - 07/02/19 1021    Education Details  possible cause of new pain due to OA.  Use of neck roll and pillow position to minimize pain and stabilize neck and head for sleep/.    Person(s) Educated  Patient    Methods  Explanation;Demonstration;Verbal cues    Comprehension  Verbalized understanding          PT Long Term Goals - 06/26/19 0847      PT LONG TERM GOAL #1   Title  She will be independent with all hEP issued    Time  4    Period  Weeks    Status  Achieved      PT LONG TERM GOAL #2   Title  She will be able to each to lower back with RT and LT hand to be able to dress and provide self care with less pain    Baseline  needs assist from opp arm    Time  4    Period  Weeks    Status  On-going      PT LONG TERM GOAL #3    Title  She will report 50% decr pain in general    Baseline  50% improvement per patient- 06/26/2019    Time  4    Period  Weeks    Status  Achieved      PT LONG TERM GOAL #4   Title  FOTO score will decrease to  39 %  limited    Baseline  42% limited    Time  4    Period  Weeks    Status  On-going            Plan - 07/02/19 0943    Clinical Impression Statement  Pain today coming from neck not LT shoulder. We will address this in next 2-3 sessions.    PT Treatment/Interventions  Taping;Passive range of motion;Manual techniques;Patient/family education;Therapeutic exercise;Iontophoresis 4mg /ml Dexamethasone;Moist Heat;Ultrasound    PT Next Visit Plan  Neck treatment for pain and ROM.    PT Home Exercise Plan  reaching behind back, door ER, wall slides for flexion, yellow band, row, extension IR, Er, towel in axilla slef mob for IR    Consulted and Agree with Plan of Care  Patient       Patient will benefit from skilled therapeutic intervention in order to improve the following deficits and impairments:  Pain, Impaired UE functional use, Decreased activity tolerance, Decreased range of motion  Visit Diagnosis: 1. Chronic right shoulder pain   2. Chronic left shoulder pain   3. Stiffness of right shoulder, not elsewhere classified   4. Stiffness of left shoulder, not elsewhere classified   5. Cervicalgia        Problem List Patient Active Problem List   Diagnosis Date Noted  . Iron deficiency anemia 01/20/2019  . Encounter for long-term (current) use of other medications 02/29/2012  . Hypertension   .  Diabetes mellitus   . Hypokalemia   . RA (rheumatoid arthritis) ([facility])   . EDEMA 08/27/2010  . CARPAL TUNNEL SYNDROME, BILATERAL 10/28/2009  . NUMBNESS 10/28/2009  . Diabetes mellitus without complication ([facility]) 09/20/2007  . Hyperlipidemia 09/19/2007  . ALLERGIC RHINITIS 09/19/2007  . INSOMNIA 09/19/2007    Caprice Red  PT 07/02/2019, 10:36 AM  [facility] [address] [place], [place], [address] Phone: [phone]   Fax:  [phone]  Name: April [name] MRN: [id number] Date of Birth: Apr 10, 1941

## 2019-07-04 ENCOUNTER — Ambulatory Visit: Payer: HMO

## 2019-07-04 ENCOUNTER — Other Ambulatory Visit: Payer: Self-pay

## 2019-07-04 DIAGNOSIS — M542 Cervicalgia: Secondary | ICD-10-CM

## 2019-07-04 DIAGNOSIS — M25512 Pain in left shoulder: Secondary | ICD-10-CM

## 2019-07-04 DIAGNOSIS — G8929 Other chronic pain: Secondary | ICD-10-CM

## 2019-07-04 NOTE — Therapy (Signed)
[facility] [address] [place], [place], [address] Phone: [phone]   Fax:  [phone]  Physical Therapy Treatment  Patient Details  Name: April [name] MRN: [id number] Date of Birth: [birth year] Referring Provider (PT): Haydee Salter, MD   Encounter Date: 07/04/2019  PT End of Session - 07/04/19 0712    Visit Number  11    Number of Visits  16    Date for PT Re-Evaluation  07/19/19    Authorization Type  Health team advantage    Authorization Time Period  visit KX at 15    PT Start Time  0713    PT Stop Time  0753    PT Time Calculation (min)  40 min    Activity Tolerance  Patient tolerated treatment well    Behavior During Therapy  [facility] for tasks assessed/performed       Past Medical History:  Diagnosis Date  . Allergic rhinitis   . Diabetes mellitus    TYPE 2  . Dyslipidemia   . Hyperkalemia   . Hypertension   . Insomnia    Pt stated no trouble falling or staying asleep.  . RA (rheumatoid arthritis) ([facility])     Past Surgical History:  Procedure Laterality Date  . BREAST CYST EXCISION Right 1967  . HAND TENDON SURGERY     Right  . OVARIAN CYST SURGERY Right 1988    There were no vitals filed for this visit.  Subjective Assessment - 07/04/19 0713    Subjective  Neck feels better with HEP    Pain Score  2     Pain Location  Neck    Pain Orientation  Left    Pain Type  Chronic pain    Pain Onset  More than a month ago    Pain Frequency  Constant                       [facility] Adult PT Treatment/Exercise - 07/04/19 0001      Exercises   Exercises  Neck      Neck Exercises: Seated   Other Seated Exercise  ROM all planes x 5 reps reviewed with Pt levator and scaleene stetch.       Modalities   Modalities  Ultrasound      Moist Heat Therapy   Number Minutes Moist Heat  8 Minutes    Moist Heat Location  Cervical      Ultrasound   Ultrasound Location  LT neck    Ultrasound  Parameters  100% 1.5Wcm2    Ultrasound Goals  Pain      Manual Therapy   Joint Mobilization  gentle PA to cervical spine.     Myofascial Release  LT neck paraspinals and  taps and levator    Passive ROM  RT side bend and rotation    Manual Traction  neck                  PT Long Term Goals - 06/26/19 0847      PT LONG TERM GOAL #1   Title  She will be independent with all hEP issued    Time  4    Period  Weeks    Status  Achieved      PT LONG TERM GOAL #2   Title  She will be able to each to lower back with RT and LT hand to be able to dress and provide self  care with less pain    Baseline  needs assist from opp arm    Time  4    Period  Weeks    Status  On-going      PT LONG TERM GOAL #3   Title  She will report 50% decr pain in general    Baseline  50% improvement per patient- 06/26/2019    Time  4    Period  Weeks    Status  Achieved      PT LONG TERM GOAL #4   Title  FOTO score will decrease to  39 %  limited    Baseline  42% limited    Time  4    Period  Weeks    Status  On-going            Plan - 07/04/19 1696    Clinical Impression Statement  Pain much improved  ROM improved.    Will continue pain treatment as needed in neck    PT Treatment/Interventions  Taping;Passive range of motion;Manual techniques;Patient/family education;Therapeutic exercise;Iontophoresis 4mg /ml Dexamethasone;Moist Heat;Ultrasound    PT Next Visit Plan  Neck treatment for pain and ROM.    PT Home Exercise Plan  reaching behind back, door ER, wall slides for flexion, yellow band, row, extension IR, Er, towel in axilla slef mob for IR    Consulted and Agree with Plan of Care  Patient       Patient will benefit from skilled therapeutic intervention in order to improve the following deficits and impairments:  Pain, Impaired UE functional use, Decreased activity tolerance, Decreased range of motion  Visit Diagnosis: 1. Chronic left shoulder pain   2. Cervicalgia         Problem List Patient Active Problem List   Diagnosis Date Noted  . Iron deficiency anemia 01/20/2019  . Encounter for long-term (current) use of other medications 02/29/2012  . Hypertension   . Diabetes mellitus   . Hypokalemia   . RA (rheumatoid arthritis) ([place])   . EDEMA 08/27/2010  . CARPAL TUNNEL SYNDROME, BILATERAL 10/28/2009  . NUMBNESS 10/28/2009  . Diabetes mellitus without complication ([place]) 78/93/8101  . Hyperlipidemia 09/19/2007  . ALLERGIC RHINITIS 09/19/2007  . INSOMNIA 09/19/2007    Darrel Hoover  PT 07/04/2019, 7:57 AM  [facility] [address] [place], [place], [address] Phone: [phone]   Fax:  [phone]  Name: April [name] MRN: [id number] Date of Birth: 03/08/1941

## 2019-07-09 ENCOUNTER — Ambulatory Visit: Payer: HMO | Attending: Rheumatology | Admitting: Physical Therapy

## 2019-07-09 ENCOUNTER — Other Ambulatory Visit: Payer: Self-pay

## 2019-07-09 ENCOUNTER — Encounter: Payer: Self-pay | Admitting: Physical Therapy

## 2019-07-09 DIAGNOSIS — M542 Cervicalgia: Secondary | ICD-10-CM | POA: Diagnosis not present

## 2019-07-09 DIAGNOSIS — M25612 Stiffness of left shoulder, not elsewhere classified: Secondary | ICD-10-CM | POA: Insufficient documentation

## 2019-07-09 DIAGNOSIS — M25512 Pain in left shoulder: Secondary | ICD-10-CM | POA: Diagnosis not present

## 2019-07-09 DIAGNOSIS — M25611 Stiffness of right shoulder, not elsewhere classified: Secondary | ICD-10-CM | POA: Diagnosis not present

## 2019-07-09 DIAGNOSIS — G8929 Other chronic pain: Secondary | ICD-10-CM | POA: Insufficient documentation

## 2019-07-09 DIAGNOSIS — M25511 Pain in right shoulder: Secondary | ICD-10-CM | POA: Diagnosis not present

## 2019-07-09 NOTE — Therapy (Signed)
[facility] [address] [place], [place], [address] Phone: [phone]   Fax:  [phone]  Physical Therapy Treatment  Patient Details  Name: April [name] MRN: [id number] Date of Birth: [birth year] Referring Provider (PT): Haydee Salter, MD   Encounter Date: 07/09/2019  PT End of Session - 07/09/19 0804    Visit Number  12    Number of Visits  16    Date for PT Re-Evaluation  07/19/19    Authorization Type  Health team advantage    Authorization Time Period  visit KX at 15    PT Start Time  0800    PT Stop Time  0851    PT Time Calculation (min)  51 min       Past Medical History:  Diagnosis Date  . Allergic rhinitis   . Diabetes mellitus    TYPE 2  . Dyslipidemia   . Hyperkalemia   . Hypertension   . Insomnia    Pt stated no trouble falling or staying asleep.  . RA (rheumatoid arthritis) (HCC)     Past Surgical History:  Procedure Laterality Date  . BREAST CYST EXCISION Right 1967  . HAND TENDON SURGERY     Right  . OVARIAN CYST SURGERY Right 1988    There were no vitals filed for this visit.  Subjective Assessment - 07/09/19 0802    Subjective  Doing much better. The neck is a 2/10.    Currently in Pain?  Yes    Pain Score  2     Pain Location  Neck    Pain Orientation  Left    Pain Descriptors / Indicators  Aching    Aggravating Factors   tilt head to the left    Pain Relieving Factors  heat, PT, massage                       [facility] Adult PT Treatment/Exercise - 07/09/19 0001      Neck Exercises: Seated   Other Seated Exercise  ROM all planes x 5 reps reviewed with Pt levator and scaleene stetch.       Shoulder Exercises: Standing   Protraction  15 reps    Theraband Level (Shoulder Protraction)  Level 2 (Red)    Protraction Limitations  punch forward    External Rotation  15 reps    Theraband Level (Shoulder External Rotation)  Level 2 (Red)    Internal Rotation  15 reps     Theraband Level (Shoulder Internal Rotation)  Level 2 (Red)    Extension  15 reps    Theraband Level (Shoulder Extension)  Level 2 (Red)    Row  15 reps    Theraband Level (Shoulder Row)  Level 2 (Red)      Shoulder Exercises: Pulleys   Flexion  2 minutes    Scaption  2 minutes      Shoulder Exercises: ROM/Strengthening   UBE (Upper Arm Bike)  L 2 3 min for and 3 min back      Moist Heat Therapy   Number Minutes Moist Heat  10 Minutes    Moist Heat Location  Cervical      Ultrasound   Ultrasound Location  LT neck     Ultrasound Parameters  100% 1.5% cm2, 1 MHZ     Ultrasound Goals  Pain      Manual Therapy   Myofascial Release  LT neck paraspinals and  taps and  levator    Passive ROM  RT side bend and rotation    Manual Traction  neck                  PT Long Term Goals - 06/26/19 0847      PT LONG TERM GOAL #1   Title  She will be independent with all hEP issued    Time  4    Period  Weeks    Status  Achieved      PT LONG TERM GOAL #2   Title  She will be able to each to lower back with RT and LT hand to be able to dress and provide self care with less pain    Baseline  needs assist from opp arm    Time  4    Period  Weeks    Status  On-going      PT LONG TERM GOAL #3   Title  She will report 50% decr pain in general    Baseline  50% improvement per patient- 06/26/2019    Time  4    Period  Weeks    Status  Achieved      PT LONG TERM GOAL #4   Title  FOTO score will decrease to  39 %  limited    Baseline  42% limited    Time  4    Period  Weeks    Status  On-going            Plan - 07/09/19 0847    Clinical Impression Statement  Pt reports she is still improved and wants to continue the neck treatment. Reviewed shoulder/scapular bands. Reviewed neck ROM and stretching. Repeated US, HMP and manual therapy as previous. No increased pain.    PT Next Visit Plan  Neck treatment for pain and ROM.    PT Home Exercise Plan  reaching behind  back, door ER, wall slides for flexion, yellow band, row, extension IR, Er, towel in axilla slef mob for IR    Consulted and Agree with Plan of Care  Patient       Patient will benefit from skilled therapeutic intervention in order to improve the following deficits and impairments:  Pain, Impaired UE functional use, Decreased activity tolerance, Decreased range of motion  Visit Diagnosis: 1. Chronic left shoulder pain   2. Cervicalgia   3. Chronic right shoulder pain   4. Stiffness of right shoulder, not elsewhere classified   5. Stiffness of left shoulder, not elsewhere classified        Problem List Patient Active Problem List   Diagnosis Date Noted  . Iron deficiency anemia 01/20/2019  . Encounter for long-term (current) use of other medications 02/29/2012  . Hypertension   . Diabetes mellitus   . Hypokalemia   . RA (rheumatoid arthritis) ([place])   . EDEMA 08/27/2010  . CARPAL TUNNEL SYNDROME, BILATERAL 10/28/2009  . NUMBNESS 10/28/2009  . Diabetes mellitus without complication ([place]) 69/62/9528  . Hyperlipidemia 09/19/2007  . ALLERGIC RHINITIS 09/19/2007  . INSOMNIA 09/19/2007    Dorene Ar, PTA 07/09/2019, 8:50 AM  [place], [place], [address] Phone: [phone]   Fax:  [phone]  Name: April [name] MRN: [id number] Date of Birth: 10-07-1941

## 2019-07-16 ENCOUNTER — Ambulatory Visit: Payer: HMO

## 2019-07-16 ENCOUNTER — Other Ambulatory Visit: Payer: Self-pay

## 2019-07-16 DIAGNOSIS — M542 Cervicalgia: Secondary | ICD-10-CM

## 2019-07-16 DIAGNOSIS — M25611 Stiffness of right shoulder, not elsewhere classified: Secondary | ICD-10-CM

## 2019-07-16 DIAGNOSIS — M25612 Stiffness of left shoulder, not elsewhere classified: Secondary | ICD-10-CM

## 2019-07-16 DIAGNOSIS — G8929 Other chronic pain: Secondary | ICD-10-CM

## 2019-07-16 DIAGNOSIS — M25512 Pain in left shoulder: Secondary | ICD-10-CM | POA: Diagnosis not present

## 2019-07-16 NOTE — Therapy (Signed)
[facility] [address] [place], [place], [address] Phone: [phone]   Fax:  [phone]  Physical Therapy Treatment  Patient Details  Name: April [name] MRN: [id number] Date of Birth: [birth year] Referring Provider (PT): Haydee Salter, MD   Encounter Date: 07/16/2019  PT End of Session - 07/16/19 0807    Visit Number  13    Number of Visits  16    Date for PT Re-Evaluation  07/19/19    Authorization Type  Health team advantage    Authorization Time Period  visit KX at 15    PT Start Time  0820    PT Stop Time  0915    PT Time Calculation (min)  55 min    Activity Tolerance  Patient tolerated treatment well    Behavior During Therapy  [facility] for tasks assessed/performed       Past Medical History:  Diagnosis Date  . Allergic rhinitis   . Diabetes mellitus    TYPE 2  . Dyslipidemia   . Hyperkalemia   . Hypertension   . Insomnia    Pt stated no trouble falling or staying asleep.  . RA (rheumatoid arthritis) (HCC)     Past Surgical History:  Procedure Laterality Date  . BREAST CYST EXCISION Right 1967  . HAND TENDON SURGERY     Right  . OVARIAN CYST SURGERY Right 1988    There were no vitals filed for this visit.  Subjective Assessment - 07/16/19 0915    Subjective  Doing much better. The neck is a 2/10.  Able to do more without incr pain.    Pain Score  2     Pain Location  Neck    Pain Orientation  Left    Pain Descriptors / Indicators  Aching    Pain Type  Chronic pain    Pain Onset  More than a month ago    Pain Frequency  Constant                       [facility] Adult PT Treatment/Exercise - 07/16/19 0001      Shoulder Exercises: Standing   Protraction  15 reps    Theraband Level (Shoulder Protraction)  Level 2 (Red)    Protraction Limitations  punch forward    External Rotation  15 reps    Theraband Level (Shoulder External Rotation)  Level 3 (Green)    Internal Rotation  15  reps    Theraband Level (Shoulder Internal Rotation)  Level 3 (Green)    Extension  15 reps    Theraband Level (Shoulder Extension)  Level 3 (Green)    Row  15 reps    Theraband Level (Shoulder Row)  Level 3 (Green)      Shoulder Exercises: Pulleys   Flexion  2 minutes    Scaption  2 minutes      Shoulder Exercises: ROM/Strengthening   UBE (Upper Arm Bike)  L 2 3 min for and 3 min back      Moist Heat Therapy   Number Minutes Moist Heat  10 Minutes    Moist Heat Location  Cervical      Ultrasound   Ultrasound Location  LT neck    Ultrasound Parameters  100% 1.5 Wcm2,      Manual Therapy   Myofascial Release  LT neck paraspinals and  traps and levator    Passive ROM  RT side bend and rotation  Manual Traction  neck                  PT Long Term Goals - 06/26/19 0847      PT LONG TERM GOAL #1   Title  She will be independent with all hEP issued    Time  4    Period  Weeks    Status  Achieved      PT LONG TERM GOAL #2   Title  She will be able to each to lower back with RT and LT hand to be able to dress and provide self care with less pain    Baseline  needs assist from opp arm    Time  4    Period  Weeks    Status  On-going      PT LONG TERM GOAL #3   Title  She will report 50% decr pain in general    Baseline  50% improvement per patient- 06/26/2019    Time  4    Period  Weeks    Status  Achieved      PT LONG TERM GOAL #4   Title  FOTO score will decrease to  39 %  limited    Baseline  42% limited    Time  4    Period  Weeks    Status  On-going            Plan - 07/16/19 6440    Clinical Impression Statement  Much improved no incr pain with exercise and neck ROm.   Will finish POc then discharge with hEP    PT Treatment/Interventions  Taping;Passive range of motion;Manual techniques;Patient/family education;Therapeutic exercise;Iontophoresis 4mg /ml Dexamethasone;Moist Heat;Ultrasound    PT Next Visit Plan  Neck treatment for pain  and ROM.  Shoulder strength    Check AROm behind back    PT Home Exercise Plan  reaching behind back, door ER, wall slides for flexion, yellow band, row, extension IR, Er, towel in axilla slef mob for IR    Consulted and Agree with Plan of Care  Patient       Patient will benefit from skilled therapeutic intervention in order to improve the following deficits and impairments:  Pain, Impaired UE functional use, Decreased activity tolerance, Decreased range of motion  Visit Diagnosis: 1. Cervicalgia   2. Chronic left shoulder pain   3. Chronic right shoulder pain   4. Stiffness of right shoulder, not elsewhere classified   5. Stiffness of left shoulder, not elsewhere classified        Problem List Patient Active Problem List   Diagnosis Date Noted  . Iron deficiency anemia 01/20/2019  . Encounter for long-term (current) use of other medications 02/29/2012  . Hypertension   . Diabetes mellitus   . Hypokalemia   . RA (rheumatoid arthritis) ([place])   . EDEMA 08/27/2010  . CARPAL TUNNEL SYNDROME, BILATERAL 10/28/2009  . NUMBNESS 10/28/2009  . Diabetes mellitus without complication ([place]) 34/74/2595  . Hyperlipidemia 09/19/2007  . ALLERGIC RHINITIS 09/19/2007  . INSOMNIA 09/19/2007    Darrel Hoover PT 07/16/2019, 9:16 AM  [facility] [address] [place], [place], [address] Phone: [phone]   Fax:  [phone]  Name: April [name] MRN: [id number] Date of Birth: 11/03/1941

## 2019-07-17 DIAGNOSIS — M25519 Pain in unspecified shoulder: Secondary | ICD-10-CM | POA: Diagnosis not present

## 2019-07-17 DIAGNOSIS — M0589 Other rheumatoid arthritis with rheumatoid factor of multiple sites: Secondary | ICD-10-CM | POA: Diagnosis not present

## 2019-07-17 DIAGNOSIS — M8580 Other specified disorders of bone density and structure, unspecified site: Secondary | ICD-10-CM | POA: Diagnosis not present

## 2019-07-17 DIAGNOSIS — Z79899 Other long term (current) drug therapy: Secondary | ICD-10-CM | POA: Diagnosis not present

## 2019-07-17 DIAGNOSIS — N289 Disorder of kidney and ureter, unspecified: Secondary | ICD-10-CM | POA: Diagnosis not present

## 2019-07-17 DIAGNOSIS — M150 Primary generalized (osteo)arthritis: Secondary | ICD-10-CM | POA: Diagnosis not present

## 2019-07-17 DIAGNOSIS — M549 Dorsalgia, unspecified: Secondary | ICD-10-CM | POA: Diagnosis not present

## 2019-07-17 DIAGNOSIS — E119 Type 2 diabetes mellitus without complications: Secondary | ICD-10-CM | POA: Diagnosis not present

## 2019-07-18 ENCOUNTER — Other Ambulatory Visit: Payer: Self-pay

## 2019-07-18 ENCOUNTER — Ambulatory Visit: Payer: HMO

## 2019-07-18 DIAGNOSIS — M25512 Pain in left shoulder: Secondary | ICD-10-CM

## 2019-07-18 DIAGNOSIS — G8929 Other chronic pain: Secondary | ICD-10-CM

## 2019-07-18 DIAGNOSIS — M25612 Stiffness of left shoulder, not elsewhere classified: Secondary | ICD-10-CM

## 2019-07-18 DIAGNOSIS — M25611 Stiffness of right shoulder, not elsewhere classified: Secondary | ICD-10-CM

## 2019-07-18 DIAGNOSIS — M542 Cervicalgia: Secondary | ICD-10-CM

## 2019-07-18 NOTE — Patient Instructions (Signed)
SHOULDER: Abduction Bilateral    Raise arms out and up at same speed with palms up. Keep elbows straight. Do not shrug shoulders. 10-15___ reps per set, 1___ sets per day, _5__ days per week   Copyright  VHI. All rights reserved.  Flexion (Active)    Raise arm to point to ceiling, keeping elbow straight. Hold ____ seconds. Repeat _10-15___ times. Do __1__ sessions per day. Activity: Use this motion to reach for items on overhead shelves.  Copyright  VHI. All rights reserved.  DO ALL WITH 2-3 POUNDS ONLY AS HIGH AS COMFORTABLE                                                                                                                                                     rOCK WOOD WITH BANDS X 1015 REPS 1X/DAY 5X/WEEK

## 2019-07-18 NOTE — Therapy (Signed)
[place], [place], [address] Phone: [phone]   Fax:  [phone]  Physical Therapy Treatment  Patient Details  Name: DEEM MARMOL MRN: [id number] Date of Birth: [birth year] Referring Provider (PT): Sabino Niemann, MD   Encounter Date: 07/18/2019  PT End of Session - 07/18/19 0835    Visit Number  14    Number of Visits  16    Date for PT Re-Evaluation  07/19/19    Authorization Type  Health team advantage    Authorization Time Period  visit KX at 57    PT Start Time  0830    PT Stop Time  0923    PT Time Calculation (min)  53 min    Activity Tolerance  Patient tolerated treatment well       Past Medical History:  Diagnosis Date  . Allergic rhinitis   . Diabetes mellitus    TYPE 2  . Dyslipidemia   . Hyperkalemia   . Hypertension   . Insomnia    Pt stated no trouble falling or staying asleep.  . RA (rheumatoid arthritis) ([place])     Past Surgical History:  Procedure Laterality Date  . BREAST CYST EXCISION Right 1967  . HAND TENDON SURGERY     Right  . OVARIAN CYST SURGERY Right 1988    There were no vitals filed for this visit.  Subjective Assessment - 07/18/19 0927    Subjective  Sore in LT shoulder joint  no real papin . rainy weather may affect this.  Saw MD and told her she  I was doing much better                       [facility] Adult PT Treatment/Exercise - 07/18/19 0001      Neck Exercises: Theraband   Shoulder Extension  Red;15 reps    Rows  15 reps;Red    Other Theraband Exercises  3# abduction , extension and flexion x 15      Shoulder Exercises: Standing   Protraction  15 reps    Theraband Level (Shoulder Protraction)  Level 2 (Red)    Protraction Limitations  punch forward    External Rotation  15 reps    Theraband Level (Shoulder External Rotation)  Level 3 (Green)    Internal Rotation  15 reps    Theraband Level (Shoulder Internal Rotation)   Level 3 (Green)    Extension  15 reps    Theraband Level (Shoulder Extension)  Level 3 (Green)    Row  15 reps    Theraband Level (Shoulder Row)  Level 3 (Green)      Shoulder Exercises: ROM/Strengthening   UBE (Upper Arm Bike)  L3 16min for 4 min back      Moist Heat Therapy   Number Minutes Moist Heat  10 Minutes      Manual Therapy   Myofascial Release  LT neck paraspinals and  traps and levator             PT Education - 07/18/19 0913    Education Details  HEP  bands and free weight  all to tolerance    Person(s) Educated  Patient    Methods  Explanation;Demonstration;Tactile cues;Verbal cues;Handout    Comprehension  Returned demonstration;Verbalized understanding          PT Long Term Goals - 06/26/19 0847      PT LONG TERM GOAL #1   Title  She will be independent with all hEP issued    Time  4    Period  Weeks    Status  Achieved      PT LONG TERM GOAL #2   Title  She will be able to each to lower back with RT and LT hand to be able to dress and provide self care with less pain    Baseline  needs assist from opp arm    Time  4    Period  Weeks    Status  On-going      PT LONG TERM GOAL #3   Title  She will report 50% decr pain in general    Baseline  50% improvement per patient- 06/26/2019    Time  4    Period  Weeks    Status  Achieved      PT LONG TERM GOAL #4   Title  FOTO score will decrease to  39 %  limited    Baseline  42% limited    Time  4    Period  Weeks    Status  On-going            Plan - 07/18/19 5284    PT Treatment/Interventions  Taping;Passive range of motion;Manual techniques;Patient/family education;Therapeutic exercise;Iontophoresis 4mg /ml Dexamethasone;Moist Heat;Ultrasound    PT Home Exercise Plan  reaching behind back, door ER, wall slides for flexion, yellow band, row, extension IR, Er, towel in axilla slef mob for IR    Consulted and Agree with Plan of Care  Patient       Patient will benefit from skilled  therapeutic intervention in order to improve the following deficits and impairments:  Pain, Impaired UE functional use, Decreased activity tolerance, Decreased range of motion  Visit Diagnosis: 1. Chronic left shoulder pain   2. Cervicalgia   3. Chronic right shoulder pain   4. Stiffness of right shoulder, not elsewhere classified   5. Stiffness of left shoulder, not elsewhere classified        Problem List Patient Active Problem List   Diagnosis Date Noted  . Iron deficiency anemia 01/20/2019  . Encounter for long-term (current) use of other medications 02/29/2012  . Hypertension   . Diabetes mellitus   . Hypokalemia   . RA (rheumatoid arthritis) (HCC)   . EDEMA 08/27/2010  . CARPAL TUNNEL SYNDROME, BILATERAL 10/28/2009  . NUMBNESS 10/28/2009  . Diabetes mellitus without complication (HCC) 09/20/2007  . Hyperlipidemia 09/19/2007  . ALLERGIC RHINITIS 09/19/2007  . INSOMNIA 09/19/2007    09/21/2007  PT 07/18/2019, 9:57 AM  [facility] [address] [place], [place], [place] Phone: [phone]   Fax:  [phone]  Name: KALEYAH LABRECK MRN: [name] Date of Birth: 03-27-1941

## 2019-07-23 ENCOUNTER — Ambulatory Visit: Payer: HMO

## 2019-07-23 ENCOUNTER — Other Ambulatory Visit: Payer: Self-pay

## 2019-07-23 DIAGNOSIS — M542 Cervicalgia: Secondary | ICD-10-CM

## 2019-07-23 DIAGNOSIS — M25512 Pain in left shoulder: Secondary | ICD-10-CM | POA: Diagnosis not present

## 2019-07-23 DIAGNOSIS — G8929 Other chronic pain: Secondary | ICD-10-CM

## 2019-07-23 NOTE — Therapy (Signed)
[place], [place], [address] Phone: [phone]   Fax:  [phone]  Physical Therapy Treatment/Discharge  Patient Details  Name: April [name] MRN: [id number] Date of Birth: 07/03/1941 Referring Provider (PT): Sabino Niemann, MD   Encounter Date: 07/23/2019  PT End of Session - 07/23/19 0831    Visit Number  15    Number of Visits  16    Date for PT Re-Evaluation  07/19/19    Authorization Type  Health team advantage    PT Start Time  0830    PT Stop Time  0923    PT Time Calculation (min)  53 min    Activity Tolerance  Patient tolerated treatment well    Behavior During Therapy  [facility] for tasks assessed/performed       Past Medical History:  Diagnosis Date  . Allergic rhinitis   . Diabetes mellitus    TYPE 2  . Dyslipidemia   . Hyperkalemia   . Hypertension   . Insomnia    Pt stated no trouble falling or staying asleep.  . RA (rheumatoid arthritis) ([place])     Past Surgical History:  Procedure Laterality Date  . BREAST CYST EXCISION Right 1967  . HAND TENDON SURGERY     Right  . OVARIAN CYST SURGERY Right 1988    There were no vitals filed for this visit.  Subjective Assessment - 07/23/19 0835    Subjective  Feel pretty good  but have had a headache.         [facility] PT Assessment - 07/23/19 0001      Assessment   Medical Diagnosis  RT shoulder pain    Referring Provider (PT)  Sabino Niemann, MD      AROM   Right Shoulder Flexion  155 Degrees    Right Shoulder ABduction  163 Degrees    Right Shoulder Internal Rotation  45 Degrees    Right Shoulder External Rotation  92 Degrees    Left Shoulder Flexion  140 Degrees    Left Shoulder ABduction  140 Degrees    Left Shoulder Internal Rotation  35 Degrees    Left Shoulder External Rotation  73 Degrees    Cervical - Right Side Bend  30    Cervical - Left Side Bend  30    Cervical - Right Rotation  65    Cervical - Left Rotation   65           All HEP reviewed and done correctly. Green band issued for band exercises                     PT Long Term Goals - 07/23/19 0848      PT LONG TERM GOAL #1   Title  She will be independent with all hEP issued    Status  Achieved      PT LONG TERM GOAL #2   Title  She will be able to each to lower back with RT and LT hand to be able to dress and provide self care with less pain    Status  Achieved      PT LONG TERM GOAL #3   Title  She will report 50% decr pain in general    Status  Achieved      PT LONG TERM GOAL #4   Title  FOTO score will decrease to  39 %  limited    Status  Achieved  Plan - 07/23/19 0831    Clinical Impression Statement  Her ROM slightly improve in neck and shoulders. (Not all Ranges)    pain much improved andundercontrol.  I don't expect she will be totatlly pain free.[name]  Discharge with HEP    PT Treatment/Interventions  Taping;Passive range of motion;Manual techniques;Patient/family education;Therapeutic exercise;Iontophoresis 28m/ml Dexamethasone;Moist Heat;Ultrasound    PT Next Visit Plan  Discharge with HEP    PT Home Exercise Plan  reaching behind back, door ER, wall slides for flexion, yellow band, row, extension IR, Er, towel in axilla slef mob for IR, [place] , lifting free weight flex  and abduction    Consulted and Agree with Plan of Care  Patient       Patient will benefit from skilled therapeutic intervention in order to improve the following deficits and impairments:  Pain, Impaired UE functional use, Decreased activity tolerance, Decreased range of motion  Visit Diagnosis: 1. Chronic left shoulder pain   2. Cervicalgia   3. Chronic right shoulder pain        Problem List Patient Active Problem List   Diagnosis Date Noted  . Iron deficiency anemia 01/20/2019  . Encounter for long-term (current) use of other medications 02/29/2012  . Hypertension   . Diabetes mellitus   .  Hypokalemia   . [place]   . EDEMA 08/27/2010  . CARPAL TUNNEL SYNDROME, BILATERAL 10/28/2009  . NUMBNESS 10/28/2009  . Diabetes mellitus without complication ([place] 183/08/4075 . Hyperlipidemia 09/19/2007  . ALLERGIC RHINITIS 09/19/2007  . INSOMNIA 09/19/2007    CDarrel HooverPT 07/23/2019, 9:16 AM  [facility] [place] [address]Phone: [phone]  Fax:  [phone] Name: April STEVENMRN: [id number] of Birth: 6May 24, 1942 PHYSICAL THERAPY DISCHARGE SUMMARY  Visits from Start of Care: 15  Current functional level related to goals / functional outcomes: See above  Remaining deficits: See above   Education / Equipment: HEP Plan: Patient agrees to discharge.  Patient goals were met. Patient is being discharged due to meeting the stated rehab goals.  ?????

## 2019-08-19 DIAGNOSIS — Z79899 Other long term (current) drug therapy: Secondary | ICD-10-CM | POA: Diagnosis not present

## 2019-08-19 DIAGNOSIS — M0589 Other rheumatoid arthritis with rheumatoid factor of multiple sites: Secondary | ICD-10-CM | POA: Diagnosis not present

## 2019-08-29 DIAGNOSIS — M1990 Unspecified osteoarthritis, unspecified site: Secondary | ICD-10-CM | POA: Diagnosis not present

## 2019-08-29 DIAGNOSIS — E1165 Type 2 diabetes mellitus with hyperglycemia: Secondary | ICD-10-CM | POA: Diagnosis not present

## 2019-08-29 DIAGNOSIS — I10 Essential (primary) hypertension: Secondary | ICD-10-CM | POA: Diagnosis not present

## 2019-08-29 DIAGNOSIS — Z7984 Long term (current) use of oral hypoglycemic drugs: Secondary | ICD-10-CM | POA: Diagnosis not present

## 2019-08-29 DIAGNOSIS — M069 Rheumatoid arthritis, unspecified: Secondary | ICD-10-CM | POA: Diagnosis not present

## 2019-08-29 DIAGNOSIS — Z23 Encounter for immunization: Secondary | ICD-10-CM | POA: Diagnosis not present

## 2019-08-29 DIAGNOSIS — E1140 Type 2 diabetes mellitus with diabetic neuropathy, unspecified: Secondary | ICD-10-CM | POA: Diagnosis not present

## 2019-08-29 DIAGNOSIS — E7800 Pure hypercholesterolemia, unspecified: Secondary | ICD-10-CM | POA: Diagnosis not present

## 2019-08-29 DIAGNOSIS — D638 Anemia in other chronic diseases classified elsewhere: Secondary | ICD-10-CM | POA: Diagnosis not present

## 2019-08-30 DIAGNOSIS — Z1231 Encounter for screening mammogram for malignant neoplasm of breast: Secondary | ICD-10-CM | POA: Diagnosis not present

## 2019-09-06 ENCOUNTER — Ambulatory Visit: Payer: HMO | Admitting: Cardiovascular Disease

## 2019-09-20 DIAGNOSIS — D638 Anemia in other chronic diseases classified elsewhere: Secondary | ICD-10-CM | POA: Diagnosis not present

## 2019-09-20 DIAGNOSIS — M1990 Unspecified osteoarthritis, unspecified site: Secondary | ICD-10-CM | POA: Diagnosis not present

## 2019-09-20 DIAGNOSIS — E1165 Type 2 diabetes mellitus with hyperglycemia: Secondary | ICD-10-CM | POA: Diagnosis not present

## 2019-09-20 DIAGNOSIS — M069 Rheumatoid arthritis, unspecified: Secondary | ICD-10-CM | POA: Diagnosis not present

## 2019-09-20 DIAGNOSIS — E7800 Pure hypercholesterolemia, unspecified: Secondary | ICD-10-CM | POA: Diagnosis not present

## 2019-09-20 DIAGNOSIS — E1140 Type 2 diabetes mellitus with diabetic neuropathy, unspecified: Secondary | ICD-10-CM | POA: Diagnosis not present

## 2019-09-20 DIAGNOSIS — I10 Essential (primary) hypertension: Secondary | ICD-10-CM | POA: Diagnosis not present

## 2019-09-22 ENCOUNTER — Other Ambulatory Visit: Payer: Self-pay | Admitting: Endocrinology

## 2019-09-23 DIAGNOSIS — G894 Chronic pain syndrome: Secondary | ICD-10-CM | POA: Diagnosis not present

## 2019-10-09 ENCOUNTER — Other Ambulatory Visit (INDEPENDENT_AMBULATORY_CARE_PROVIDER_SITE_OTHER): Payer: HMO

## 2019-10-09 ENCOUNTER — Ambulatory Visit: Payer: HMO | Admitting: Cardiovascular Disease

## 2019-10-09 ENCOUNTER — Other Ambulatory Visit: Payer: Self-pay

## 2019-10-09 ENCOUNTER — Encounter: Payer: Self-pay | Admitting: Cardiovascular Disease

## 2019-10-09 VITALS — BP 106/72 | HR 68 | Ht 63.5 in | Wt 163.2 lb

## 2019-10-09 DIAGNOSIS — E119 Type 2 diabetes mellitus without complications: Secondary | ICD-10-CM | POA: Diagnosis not present

## 2019-10-09 DIAGNOSIS — I10 Essential (primary) hypertension: Secondary | ICD-10-CM | POA: Diagnosis not present

## 2019-10-09 DIAGNOSIS — E782 Mixed hyperlipidemia: Secondary | ICD-10-CM | POA: Diagnosis not present

## 2019-10-09 LAB — TSH: TSH: 2.58 u[IU]/mL (ref 0.35–4.50)

## 2019-10-09 LAB — BASIC METABOLIC PANEL
BUN: 23 mg/dL (ref 6–23)
CO2: 27 mEq/L (ref 19–32)
Calcium: 9.4 mg/dL (ref 8.4–10.5)
Chloride: 102 mEq/L (ref 96–112)
Creatinine, Ser: 1.34 mg/dL — ABNORMAL HIGH (ref 0.40–1.20)
GFR: 46.49 mL/min — ABNORMAL LOW (ref >60.00)
Glucose, Bld: 88 mg/dL (ref 70–99)
Potassium: 4.4 mEq/L (ref 3.5–5.1)
Sodium: 136 mEq/L (ref 135–145)

## 2019-10-09 LAB — LDL CHOLESTEROL, DIRECT: Direct LDL: 85 mg/dL

## 2019-10-09 LAB — HEMOGLOBIN A1C: Hgb A1c MFr Bld: 5.8 % (ref 4.6–6.5)
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
## 2019-10-09 NOTE — Patient Instructions (Signed)
Medication Instructions:  Your physician recommends that you continue on your current medications as directed. Please refer to the Current Medication list given to you today.  *If you need a refill on your cardiac medications before your next appointment, please call your pharmacy*   Lab Work: None Ordered   Testing/Procedures: None Ordered   Follow-Up: At [facility], you and your health needs are our priority.  As part of our continuing mission to provide you with exceptional heart care, we have created designated Provider Care Teams.  These Care Teams include your primary Cardiologist (physician) and Advanced Practice Providers (APPs -  Physician Assistants and Nurse Practitioners) who all work together to provide you with the care you need, when you need it.  Your next appointment:   12 months  The format for your next appointment:   In Person  Provider:   You may see Philip Nahser, MD or one of the following Advanced Practice Providers on your designated Care Team:    Scott Weaver, PA-C  Vin Bhagat, PA-C  Janine Hammond, NP    

## 2019-10-14 ENCOUNTER — Other Ambulatory Visit: Payer: HMO

## 2019-10-15 ENCOUNTER — Telehealth: Payer: Self-pay | Admitting: Cardiovascular Disease

## 2019-10-15 ENCOUNTER — Other Ambulatory Visit: Payer: Self-pay

## 2019-10-15 ENCOUNTER — Other Ambulatory Visit: Payer: Self-pay | Admitting: *Deleted

## 2019-10-15 DIAGNOSIS — M1990 Unspecified osteoarthritis, unspecified site: Secondary | ICD-10-CM | POA: Diagnosis not present

## 2019-10-15 DIAGNOSIS — M069 Rheumatoid arthritis, unspecified: Secondary | ICD-10-CM | POA: Diagnosis not present

## 2019-10-15 DIAGNOSIS — I10 Essential (primary) hypertension: Secondary | ICD-10-CM | POA: Diagnosis not present

## 2019-10-15 DIAGNOSIS — E7800 Pure hypercholesterolemia, unspecified: Secondary | ICD-10-CM | POA: Diagnosis not present

## 2019-10-15 DIAGNOSIS — E1140 Type 2 diabetes mellitus with diabetic neuropathy, unspecified: Secondary | ICD-10-CM | POA: Diagnosis not present

## 2019-10-15 DIAGNOSIS — E1165 Type 2 diabetes mellitus with hyperglycemia: Secondary | ICD-10-CM | POA: Diagnosis not present

## 2019-10-15 DIAGNOSIS — D638 Anemia in other chronic diseases classified elsewhere: Secondary | ICD-10-CM | POA: Diagnosis not present

## 2019-10-15 MED ORDER — SPIRONOLACTONE 100 MG PO TABS: ORAL_TABLET | ORAL | 3 refills | Status: DC

## 2019-10-15 NOTE — Patient Outreach (Signed)
  [facility]) Care Management Chronic Special Needs Program  10/15/2019  Name: LUTISHA KNOCHE DOB: Feb 06, 1941  MRN: [id number]  Ms. Naveh Rickles is enrolled in a chronic special needs plan for Diabetes. RNCM Health Risk Assessment(HRA) not completed. RNCM assisted client with completing HRA and reviewed and updated care plan.  Subjective: RNCM spoke with client. Client reports a history of diabetes and HTN. She lives with her husband. Client reports her last A1C was 6. Per [facility] A1C 5.8 on 10/09/2019. She denies any blood sugars less than 70. She states she checks her blood sugar daily, but did not check her blood sugar today. She reports her blood sugar was 101 on yesterday. Client reports history of chronic pain in right leg. She reports started about a year ago. She states discomfort is tolerable at present and has improved with medication, leg elevation and "bigolow sleepy time tea".  Medications discussed. Client denies any issues with obtaining medications and denies any questions.    Goals Addressed            This Visit's Progress   .  Acknowledge receipt of Programme researcher, broadcasting/film/video      . COMPLETED: Client understands the importance of follow-up with providers by attending scheduled visits       Voices understanding of importance of attending provider visits.    . COMPLETED: Client will use Assistive Devices as needed and verbalize understanding of device use       Denies any difficulty with use of glucometer.    . Client will verbalize knowledge of self management of Hypertension as evidences by BP reading of 140/90 or less; or as defined by provider       Client reports she takes her medication126/76-client reports less than 140/90    . COMPLETED: Obtain Annual Eye (retinal)  Exam        Client reports done    . COMPLETED: Obtain Annual Foot Exam       done    . Obtain annual screen for micro albuminuria (urine) , nephropathy (kidney problems)    On track   . COMPLETED: Obtain Hemoglobin A1C at least 2 times per year       Client reports done 2 times/year.    . COMPLETED: Visit Primary Care Provider or Endocrinologist at least 2 times per year        Client reports has seen endocrinologist at least 2times/year.      Covid 19 precautions discussed. RNCM encouraged client to call 24 hour nurse advice line as needed-confirmed client has the contact number. RNCM reiterated the 24 hour nurse advice line available as needed; reinforced the healthcare concierge available for benefits questions and encouraged client to contact RNCM as needed.  Plan: RNCM will send updated care plan to client, send updated care plan to primary care provider. RNCM will follow up within 4-5 months.    Thea Silversmith, RN, MSN, [place] [phone]   .

## 2019-10-15 NOTE — Telephone Encounter (Signed)
New Message     *STAT* If patient is at the pharmacy, call can be transferred to refill team.   1. Which medications need to be refilled? (please list name of each medication and dose if known) Spironolactone   2. Which pharmacy/location (including street and city if local pharmacy) is medication to be sent to? [facility]   3. Do they need a 30 day or 90 day supply? 90 day

## 2019-10-17 ENCOUNTER — Ambulatory Visit (INDEPENDENT_AMBULATORY_CARE_PROVIDER_SITE_OTHER): Payer: HMO | Admitting: Endocrinology

## 2019-10-17 ENCOUNTER — Other Ambulatory Visit: Payer: Self-pay

## 2019-10-17 ENCOUNTER — Encounter: Payer: Self-pay | Admitting: Endocrinology

## 2019-10-17 VITALS — BP 138/68 | HR 91 | Ht 63.5 in | Wt 162.8 lb

## 2019-10-17 DIAGNOSIS — E7800 Pure hypercholesterolemia, unspecified: Secondary | ICD-10-CM

## 2019-10-17 DIAGNOSIS — N289 Disorder of kidney and ureter, unspecified: Secondary | ICD-10-CM | POA: Diagnosis not present

## 2019-10-17 DIAGNOSIS — E119 Type 2 diabetes mellitus without complications: Secondary | ICD-10-CM

## 2019-10-17 DIAGNOSIS — Z79899 Other long term (current) drug therapy: Secondary | ICD-10-CM | POA: Diagnosis not present

## 2019-10-17 DIAGNOSIS — M5416 Radiculopathy, lumbar region: Secondary | ICD-10-CM | POA: Diagnosis not present

## 2019-10-17 NOTE — Progress Notes (Signed)
Patient ID: April [name], female   DOB: 01-22-1941, 78 y.o.   MRN: [id number]   Reason for Appointment: follow-up of various problems  History of Present Illness    HYPERTENSION:  diagnosed around 1979 with symptoms of headaches Previously she was taking Avapro, clonidine 0.6 at bedtime and Dyazide but blood pressure was relatively high with this regimen  For evaluation of her hyperaldosteronism she was switched to labetalol 100 mg twice a day, Tenex 2 mg and doxazosin With this regimen her blood pressure has been much better She was subsequently started on Aldactone to help with her severe hypokalemia  Her treatment regimen has been continued except her doxazosin was stopped previously by cardiologist when her blood pressure was relatively low  She is taking Aldactone 100 mg and labetalol 100 mg along with 12.5 mg hydrochlorothiazide   She has not been checking her blood pressure at the drug store or at home Blood pressure was low normal with cardiologist recently but she was asymptomatic and no medication changes were made  BP Readings from Last 3 Encounters:  10/17/19 138/68  10/09/19 106/72  06/13/19 132/60   RENAL function has been stable  Lab Results  Component Value Date   CREATININE 1.34 (H) 10/09/2019   CREATININE 1.19 06/06/2019   CREATININE 1.13 02/07/2019      HYPOKALEMIA:   This previously had been a significant longstanding problem and associated with hypertension  Previously had  been prescribed 6 tablets of potassium daily She was off her diuretics and Avapro when her evaluation for hyperaldosteronism was done, aldosterone level was 6.0 along with a relatively low renin level  She has a normal potassium level consistently with Aldactone 100 mg daily along with 1 tablet of potassium 20 mEq daily  Lab Results  Component Value Date   K 4.4 10/09/2019     DIABETES type II:   She has had diabetes since at least 2015 This has been   well controlled with a regimen of metformin 1 g twice a day   A1c has been consistently upper normal, now 5.8 compared to 6.1%  She has not brought her monitor for download today Usually trying not to go over her portions with diet and avoiding regular soft drinks More recently has been able to lose a little weight that she had gained Has been quite regular with her metformin, 1 g of the regular preparation  She checks her blood sugars mostly fasting and rarely in the afternoon or midday  GLUCOSE readings by recall:  Blood sugar range 99-133, does not know her average reading  Previous range: 95-117   Weight history:   Wt Readings from Last 3 Encounters:  10/17/19 162 lb 12.8 oz (73.8 kg)  10/09/19 163 lb 3.2 oz (74 kg)  06/13/19 164 lb (74.4 kg)      Lab Results  Component Value Date   HGBA1C 5.8 10/09/2019   HGBA1C 6.1 06/06/2019   HGBA1C 5.9 02/07/2019   Lab Results  Component Value Date   MICROALBUR 2.0 (H) 02/07/2019   LDLCALC 73 06/25/2018   CREATININE 1.34 (H) 10/09/2019    OTHER active problems discussed today are in review of systems  LABS:  No visits with results within 1 Week(s) from this visit.  Latest known visit with results is:  Lab on 10/09/2019  Component Date Value Ref Range Status  . TSH 10/09/2019 2.58  0.35 - 4.50 uIU/mL Final  . Direct LDL 10/09/2019 85.0  mg/dL Final  Optimal:  <100 mg/dLNear or Above Optimal:  100-129 mg/dLBorderline High:  130-159 mg/dLHigh:  160-189 mg/dLVery High:  >190 mg/dL  . Sodium 10/09/2019 136  135 - 145 mEq/L Final  . Potassium 10/09/2019 4.4  3.5 - 5.1 mEq/L Final  . Chloride 10/09/2019 102  96 - 112 mEq/L Final  . CO2 10/09/2019 27  19 - 32 mEq/L Final  . Glucose, Bld 10/09/2019 88  70 - 99 mg/dL Final  . BUN 54/56/2563 23  6 - 23 mg/dL Final  . Creatinine, Ser 10/09/2019 1.34* 0.40 - 1.20 mg/dL Final  . Calcium 89/37/3428 9.4  8.4 - 10.5 mg/dL Final  . GFR 76/81/1572 46.49* >60.00 mL/min Final  . Hgb  A1c MFr Bld 10/09/2019 5.8  4.6 - 6.5 % Final   Glycemic Control Guidelines for People with Diabetes:Non Diabetic:  <6%Goal of Therapy: <7%Additional Action Suggested:  >8%     Allergies as of 10/17/2019      Reactions   Acetaminophen Nausea Only   Actos [pioglitazone Hydrochloride]    edema   Aspirin Nausea Only   Atorvastatin Other (See Comments)   Felt drunk/high floating   Morphine Sulfate Other (See Comments)   Burns injecting "arm is on fire"   Naproxen Sodium Nausea Only   Sitagliptin Phosphate Nausea And Vomiting      Medication List       Accurate as of October 17, 2019  9:21 AM. If you have any questions, ask your nurse or doctor.        Accu-Chek Compact Plus test strip Generic drug: glucose blood Use to check blood sugar once a day dx code E11.9   Accu-Chek Softclix Lancets lancets Use to check blood sugar once a day dx code E11.9   amitriptyline 25 MG tablet Commonly known as: ELAVIL Take 25 mg by mouth at bedtime.   ezetimibe 10 MG tablet Commonly known as: ZETIA Take 10 mg by mouth daily.   Ferrex 150 150 MG capsule Generic drug: iron polysaccharides TAKE 1 CAPSULE BY MOUTH TWICE A DAY   folic acid 0.5 MG tablet Commonly known as: FOLVITE Take 0.5 mg by mouth daily.   gabapentin 300 MG capsule Commonly known as: NEURONTIN Take 300 mg by mouth daily.   Ginger 500 MG Caps Take 1 capsule by mouth daily.   hydrochlorothiazide 12.5 MG capsule Commonly known as: MICROZIDE Take 1 capsule by mouth once daily   Klor-Con M20 20 MEQ tablet Generic drug: potassium chloride SA TAKE 1 TABLET BY MOUTH DAILY.   labetalol 100 MG tablet Commonly known as: NORMODYNE TAKE 1 TABLET BY MOUTH TWICE A DAY   metFORMIN 1000 MG tablet Commonly known as: GLUCOPHAGE TAKE 1 TABLET BY MOUTH TWICE A DAY   methotrexate 2.5 MG tablet Commonly known as: RHEUMATREX Take 15 mg by mouth once a week. Patient takes 3 tablets twice once a week to add up to be 6 tablets  once weekly, starts with 2 tablets in the evening and takes 2 more tablets in the morning   montelukast 10 MG tablet Commonly known as: SINGULAIR Take 10 mg by mouth daily.   NONFORMULARY OR COMPOUNDED ITEM [facility] compound:  Peripheral Neuropathy cream - Bu[ovacaome 1%, Doxepin 3%, Gabapentin 6%, Pentoxifylline 3%, Topiramate 1%, dispense 120grams, apply 1-2 grams to affected area 3-4 times daily, + 3 refills.   Nucynta 50 MG tablet Generic drug: tapentadol Take 50 mg by mouth as needed for severe pain (spasms).   pravastatin 80 MG tablet Commonly  known as: PRAVACHOL TAKE 1 TABLET BY MOUTH DAILY   spironolactone 100 MG tablet Commonly known as: ALDACTONE TAKE 1 TABLET BY MOUTH EVERY DAY   Turmeric 450 MG Caps Take 1 capsule by mouth daily.   vitamin B-12 1000 MCG tablet Commonly known as: CYANOCOBALAMIN Take 1,000 mcg by mouth daily.       Allergies:  Allergies  Allergen Reactions  . Acetaminophen Nausea Only  . Actos [Pioglitazone Hydrochloride]     edema  . Aspirin Nausea Only  . Atorvastatin Other (See Comments)    Felt drunk/high floating  . Morphine Sulfate Other (See Comments)    Burns injecting "arm is on fire"  . Naproxen Sodium Nausea Only  . Sitagliptin Phosphate Nausea And Vomiting    Past Medical History:  Diagnosis Date  . Allergic rhinitis   . Diabetes mellitus    TYPE 2  . Dyslipidemia   . Hyperkalemia   . Hypertension   . Insomnia    Pt stated no trouble falling or staying asleep.  . RA (rheumatoid arthritis) ([place])     Past Surgical History:  Procedure Laterality Date  . BREAST CYST EXCISION Right 1967  . HAND TENDON SURGERY     Right  . OVARIAN CYST SURGERY Right 1988    Family History  Problem Relation Age of Onset  . Diabetes Mother   . Diabetes Father   . Cancer Father        Prostate  . Diabetes Sister   . Stroke Maternal Grandmother        Cause of death  . Healthy Brother     Social History:  reports that  she has never smoked. She has never used smokeless tobacco. She reports that she does not drink alcohol or use drugs.  Review of Systems:   History of rheumatoid arthritis, on  Methotrexate, recently reduced dosage  Vitamin D deficiency: Currently taking vitamin D3, 1000 U daily  LIPIDS: Taking Zetia from PCP and is also on pravastatin 80 mg Recent LDL 85   Lab Results  Component Value Date   CHOL 149 06/25/2018   HDL 66.60 06/25/2018   LDLCALC 73 06/25/2018   LDLDIRECT 85.0 10/09/2019   TRIG 45.0 06/25/2018   CHOLHDL 2 06/25/2018   Lab Results  Component Value Date   ALT 17 02/07/2019   ALT 14 01/14/2019   ALT 23 09/11/2017      Examination:   BP 138/68 (BP Location: Left Arm, Patient Position: Sitting, Cuff Size: Normal)   Pulse 91   Ht 5' 3.5" (1.613 m)   Wt 162 lb 12.8 oz (73.8 kg)   SpO2 97%   BMI 28.39 kg/m   Body mass index is 28.39 kg/m.    Standing blood pressure 138/70 left arm  Assesment/PLAN:  Hypertension:  Blood pressure is well controlled   She is on a 3 drug regimen of  Aldactone 100 mg, labetalol and  HCTZ   RENAL dysfunction: Her creatinine is relatively higher and not clear if this is related to transiently lower blood pressure about 10 days ago Blood pressure is not low today and will have her get labs checked next month when she goes to her rheumatologist   Hypokalemia: She has a consistently normal potassium level with 100 mg Aldactone and also with only 20 mEq of potassium  Diagnosis probably renal potassium wasting and not hyperaldosteronism causing hypokalemia   DIABETES: Well controlled and A1c is consistently in the upper normal range with 2000 mg  metformin alone A1c 5.8 can may be lower than expected However she has not had any high blood sugars both in the lab or at home  Weight is slightly better  She will continue Metformin alone, to check readings more after meals and be consistent with diet  HYPERCHOLESTEROLEMIA:  Well-controlled   She will follow-up in 4 months   There are no Patient Instructions on file for this visit.   Reather Littler 10/17/2019, 9:21 AM

## 2019-10-22 DIAGNOSIS — N289 Disorder of kidney and ureter, unspecified: Secondary | ICD-10-CM | POA: Diagnosis not present

## 2019-10-22 DIAGNOSIS — E119 Type 2 diabetes mellitus without complications: Secondary | ICD-10-CM | POA: Diagnosis not present

## 2019-10-22 DIAGNOSIS — Z79899 Other long term (current) drug therapy: Secondary | ICD-10-CM | POA: Diagnosis not present

## 2019-10-22 DIAGNOSIS — M0589 Other rheumatoid arthritis with rheumatoid factor of multiple sites: Secondary | ICD-10-CM | POA: Diagnosis not present

## 2019-10-22 DIAGNOSIS — M150 Primary generalized (osteo)arthritis: Secondary | ICD-10-CM | POA: Diagnosis not present

## 2019-10-22 DIAGNOSIS — M25519 Pain in unspecified shoulder: Secondary | ICD-10-CM | POA: Diagnosis not present

## 2019-10-22 DIAGNOSIS — M8580 Other specified disorders of bone density and structure, unspecified site: Secondary | ICD-10-CM | POA: Diagnosis not present

## 2019-10-22 DIAGNOSIS — M549 Dorsalgia, unspecified: Secondary | ICD-10-CM | POA: Diagnosis not present

## 2019-10-30 ENCOUNTER — Other Ambulatory Visit: Payer: Self-pay | Admitting: Cardiovascular Disease

## 2019-10-30 ENCOUNTER — Other Ambulatory Visit: Payer: Self-pay | Admitting: Endocrinology

## 2019-11-11 DIAGNOSIS — Z79899 Other long term (current) drug therapy: Secondary | ICD-10-CM | POA: Diagnosis not present

## 2019-11-11 DIAGNOSIS — Z5181 Encounter for therapeutic drug level monitoring: Secondary | ICD-10-CM | POA: Diagnosis not present

## 2019-11-15 DIAGNOSIS — M1990 Unspecified osteoarthritis, unspecified site: Secondary | ICD-10-CM | POA: Diagnosis not present

## 2019-11-15 DIAGNOSIS — M069 Rheumatoid arthritis, unspecified: Secondary | ICD-10-CM | POA: Diagnosis not present

## 2019-11-15 DIAGNOSIS — E1140 Type 2 diabetes mellitus with diabetic neuropathy, unspecified: Secondary | ICD-10-CM | POA: Diagnosis not present

## 2019-11-15 DIAGNOSIS — Z7984 Long term (current) use of oral hypoglycemic drugs: Secondary | ICD-10-CM | POA: Diagnosis not present

## 2019-11-15 DIAGNOSIS — E1165 Type 2 diabetes mellitus with hyperglycemia: Secondary | ICD-10-CM | POA: Diagnosis not present

## 2019-11-15 DIAGNOSIS — I10 Essential (primary) hypertension: Secondary | ICD-10-CM | POA: Diagnosis not present

## 2019-11-15 DIAGNOSIS — D638 Anemia in other chronic diseases classified elsewhere: Secondary | ICD-10-CM | POA: Diagnosis not present

## 2019-11-15 DIAGNOSIS — E7800 Pure hypercholesterolemia, unspecified: Secondary | ICD-10-CM | POA: Diagnosis not present

## 2019-12-24 DIAGNOSIS — Z23 Encounter for immunization: Secondary | ICD-10-CM | POA: Diagnosis not present

## 2020-01-01 DIAGNOSIS — E119 Type 2 diabetes mellitus without complications: Secondary | ICD-10-CM | POA: Diagnosis not present

## 2020-01-01 LAB — HM DIABETES EYE EXAM

## 2020-01-03 DIAGNOSIS — M069 Rheumatoid arthritis, unspecified: Secondary | ICD-10-CM | POA: Diagnosis not present

## 2020-01-03 DIAGNOSIS — D638 Anemia in other chronic diseases classified elsewhere: Secondary | ICD-10-CM | POA: Diagnosis not present

## 2020-01-03 DIAGNOSIS — E1165 Type 2 diabetes mellitus with hyperglycemia: Secondary | ICD-10-CM | POA: Diagnosis not present

## 2020-01-03 DIAGNOSIS — E1140 Type 2 diabetes mellitus with diabetic neuropathy, unspecified: Secondary | ICD-10-CM | POA: Diagnosis not present

## 2020-01-03 DIAGNOSIS — I10 Essential (primary) hypertension: Secondary | ICD-10-CM | POA: Diagnosis not present

## 2020-01-03 DIAGNOSIS — E7800 Pure hypercholesterolemia, unspecified: Secondary | ICD-10-CM | POA: Diagnosis not present

## 2020-01-03 DIAGNOSIS — M1990 Unspecified osteoarthritis, unspecified site: Secondary | ICD-10-CM | POA: Diagnosis not present

## 2020-01-14 DIAGNOSIS — I10 Essential (primary) hypertension: Secondary | ICD-10-CM | POA: Diagnosis not present

## 2020-01-14 DIAGNOSIS — M1990 Unspecified osteoarthritis, unspecified site: Secondary | ICD-10-CM | POA: Diagnosis not present

## 2020-01-14 DIAGNOSIS — E7800 Pure hypercholesterolemia, unspecified: Secondary | ICD-10-CM | POA: Diagnosis not present

## 2020-01-14 DIAGNOSIS — E1165 Type 2 diabetes mellitus with hyperglycemia: Secondary | ICD-10-CM | POA: Diagnosis not present

## 2020-01-14 DIAGNOSIS — E1140 Type 2 diabetes mellitus with diabetic neuropathy, unspecified: Secondary | ICD-10-CM | POA: Diagnosis not present

## 2020-01-14 DIAGNOSIS — D638 Anemia in other chronic diseases classified elsewhere: Secondary | ICD-10-CM | POA: Diagnosis not present

## 2020-01-14 DIAGNOSIS — M069 Rheumatoid arthritis, unspecified: Secondary | ICD-10-CM | POA: Diagnosis not present

## 2020-01-15 ENCOUNTER — Inpatient Hospital Stay: Payer: HMO | Attending: Hematology

## 2020-01-15 ENCOUNTER — Other Ambulatory Visit: Payer: Self-pay

## 2020-01-15 ENCOUNTER — Inpatient Hospital Stay (HOSPITAL_BASED_OUTPATIENT_CLINIC_OR_DEPARTMENT_OTHER): Payer: HMO | Admitting: Hematology

## 2020-01-15 VITALS — BP 150/81 | HR 81 | Temp 98.2°F | Resp 18 | Ht 63.5 in | Wt 158.8 lb

## 2020-01-15 DIAGNOSIS — D509 Iron deficiency anemia, unspecified: Secondary | ICD-10-CM

## 2020-01-15 DIAGNOSIS — R197 Diarrhea, unspecified: Secondary | ICD-10-CM | POA: Diagnosis not present

## 2020-01-15 DIAGNOSIS — M069 Rheumatoid arthritis, unspecified: Secondary | ICD-10-CM | POA: Insufficient documentation

## 2020-01-15 DIAGNOSIS — I10 Essential (primary) hypertension: Secondary | ICD-10-CM | POA: Diagnosis not present

## 2020-01-15 DIAGNOSIS — E785 Hyperlipidemia, unspecified: Secondary | ICD-10-CM | POA: Insufficient documentation

## 2020-01-15 DIAGNOSIS — E119 Type 2 diabetes mellitus without complications: Secondary | ICD-10-CM | POA: Insufficient documentation

## 2020-01-15 DIAGNOSIS — D638 Anemia in other chronic diseases classified elsewhere: Secondary | ICD-10-CM | POA: Insufficient documentation

## 2020-01-15 LAB — CMP (CANCER CENTER ONLY)
ALT: 24 U/L (ref 0–44)
AST: 19 U/L (ref 15–41)
Albumin: 4 g/dL (ref 3.5–5.0)
Alkaline Phosphatase: 82 U/L (ref 38–126)
Anion gap: 10 (ref 5–15)
BUN: 20 mg/dL (ref 8–23)
CO2: 26 mmol/L (ref 22–32)
Calcium: 9.2 mg/dL (ref 8.9–10.3)
Chloride: 105 mmol/L (ref 98–111)
Creatinine: 1.08 mg/dL — ABNORMAL HIGH (ref 0.44–1.00)
GFR, Est AFR Am: 58 mL/min — ABNORMAL LOW (ref >60)
GFR, Estimated: 50 mL/min — ABNORMAL LOW (ref >60)
Glucose, Bld: 91 mg/dL (ref 70–99)
Potassium: 4 mmol/L (ref 3.5–5.1)
Sodium: 141 mmol/L (ref 135–145)
Total Bilirubin: 0.7 mg/dL (ref 0.3–1.2)
Total Protein: 7.1 g/dL (ref 6.5–8.1)

## 2020-01-15 LAB — CBC WITH DIFFERENTIAL/PLATELET
Abs Immature Granulocytes: 0.02 10*3/uL (ref 0.00–0.07)
Basophils Absolute: 0 10*3/uL (ref 0.0–0.1)
Basophils Relative: 0 %
Eosinophils Absolute: 0.1 10*3/uL (ref 0.0–0.5)
Eosinophils Relative: 2 %
HCT: 32.6 % — ABNORMAL LOW (ref 36.0–46.0)
Hemoglobin: 10.2 g/dL — ABNORMAL LOW (ref 12.0–15.0)
Immature Granulocytes: 0 %
Lymphocytes Relative: 25 %
Lymphs Abs: 1.4 10*3/uL (ref 0.7–4.0)
MCH: 30.2 pg (ref 26.0–34.0)
MCHC: 31.3 g/dL (ref 30.0–36.0)
MCV: 96.4 fL (ref 80.0–100.0)
Monocytes Absolute: 0.2 10*3/uL (ref 0.1–1.0)
Monocytes Relative: 4 %
Neutro Abs: 3.7 10*3/uL (ref 1.7–7.7)
Neutrophils Relative %: 69 %
Platelets: 327 10*3/uL (ref 150–400)
RBC: 3.38 MIL/uL — ABNORMAL LOW (ref 3.87–5.11)
RDW: 14.7 % (ref 11.5–15.5)
WBC: 5.4 10*3/uL (ref 4.0–10.5)
nRBC: 0 % (ref 0.0–0.2)

## 2020-01-15 LAB — IRON AND TIBC
Iron: 89 ug/dL (ref 41–142)
Saturation Ratios: 25 % (ref 21–57)
TIBC: 355 ug/dL (ref 236–444)
UIBC: 267 ug/dL (ref 120–384)

## 2020-01-15 LAB — FERRITIN: Ferritin: 147 ng/mL (ref 11–307)
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
## 2020-01-20 ENCOUNTER — Other Ambulatory Visit: Payer: Self-pay

## 2020-01-20 ENCOUNTER — Other Ambulatory Visit: Payer: HMO

## 2020-01-22 DIAGNOSIS — M150 Primary generalized (osteo)arthritis: Secondary | ICD-10-CM | POA: Diagnosis not present

## 2020-01-22 DIAGNOSIS — N289 Disorder of kidney and ureter, unspecified: Secondary | ICD-10-CM | POA: Diagnosis not present

## 2020-01-22 DIAGNOSIS — E119 Type 2 diabetes mellitus without complications: Secondary | ICD-10-CM | POA: Diagnosis not present

## 2020-01-22 DIAGNOSIS — M549 Dorsalgia, unspecified: Secondary | ICD-10-CM | POA: Diagnosis not present

## 2020-01-22 DIAGNOSIS — M0589 Other rheumatoid arthritis with rheumatoid factor of multiple sites: Secondary | ICD-10-CM | POA: Diagnosis not present

## 2020-01-22 DIAGNOSIS — M8580 Other specified disorders of bone density and structure, unspecified site: Secondary | ICD-10-CM | POA: Diagnosis not present

## 2020-01-22 DIAGNOSIS — Z79899 Other long term (current) drug therapy: Secondary | ICD-10-CM | POA: Diagnosis not present

## 2020-01-23 ENCOUNTER — Ambulatory Visit: Payer: HMO | Admitting: Endocrinology

## 2020-01-26 ENCOUNTER — Other Ambulatory Visit: Payer: Self-pay | Admitting: Endocrinology

## 2020-02-24 ENCOUNTER — Other Ambulatory Visit: Payer: Self-pay

## 2020-02-24 ENCOUNTER — Other Ambulatory Visit (INDEPENDENT_AMBULATORY_CARE_PROVIDER_SITE_OTHER): Payer: HMO

## 2020-02-24 DIAGNOSIS — E7800 Pure hypercholesterolemia, unspecified: Secondary | ICD-10-CM

## 2020-02-24 DIAGNOSIS — E119 Type 2 diabetes mellitus without complications: Secondary | ICD-10-CM

## 2020-02-24 LAB — MICROALBUMIN / CREATININE URINE RATIO
Creatinine,U: 169.9 mg/dL
Microalb Creat Ratio: 1.1 mg/g (ref 0.0–30.0)
Microalb, Ur: 1.9 mg/dL (ref 0.0–1.9)

## 2020-02-24 LAB — COMPREHENSIVE METABOLIC PANEL
ALT: 24 U/L (ref 0–35)
AST: 22 U/L (ref 0–37)
Albumin: 4.1 g/dL (ref 3.5–5.2)
Alkaline Phosphatase: 87 U/L (ref 39–117)
BUN: 21 mg/dL (ref 6–23)
CO2: 25 mEq/L (ref 19–32)
Calcium: 9.3 mg/dL (ref 8.4–10.5)
Chloride: 103 mEq/L (ref 96–112)
Creatinine, Ser: 1.15 mg/dL (ref 0.40–1.20)
GFR: 55.4 mL/min — ABNORMAL LOW (ref >60.00)
Glucose, Bld: 99 mg/dL (ref 70–99)
Potassium: 3.8 mEq/L (ref 3.5–5.1)
Sodium: 137 mEq/L (ref 135–145)
Total Bilirubin: 0.7 mg/dL (ref 0.2–1.2)
Total Protein: 7.1 g/dL (ref 6.0–8.3)

## 2020-02-24 LAB — URINALYSIS, ROUTINE W REFLEX MICROSCOPIC
Bilirubin Urine: NEGATIVE
Hgb urine dipstick: NEGATIVE
Ketones, ur: NEGATIVE
Leukocytes,Ua: NEGATIVE
Nitrite: NEGATIVE
RBC / HPF: NONE SEEN (ref 0–?)
Specific Gravity, Urine: 1.02 (ref 1.000–1.030)
Total Protein, Urine: NEGATIVE
Urine Glucose: NEGATIVE
Urobilinogen, UA: 0.2 (ref 0.0–1.0)
pH: 5.5 (ref 5.0–8.0)

## 2020-02-24 LAB — LIPID PANEL
Cholesterol: 180 mg/dL (ref 0–200)
HDL: 71.2 mg/dL (ref >39.00)
LDL Cholesterol: 96 mg/dL (ref 0–99)
NonHDL: 109.1
Total CHOL/HDL Ratio: 3
Triglycerides: 65 mg/dL (ref 0.0–149.0)
VLDL: 13 mg/dL (ref 0.0–40.0)

## 2020-02-24 LAB — HEMOGLOBIN A1C: Hgb A1c MFr Bld: 5.7 % (ref 4.6–6.5)

## 2020-02-25 LAB — FRUCTOSAMINE: Fructosamine: 211 umol/L (ref 0–285)

## 2020-02-26 ENCOUNTER — Ambulatory Visit (INDEPENDENT_AMBULATORY_CARE_PROVIDER_SITE_OTHER): Payer: HMO | Admitting: Endocrinology

## 2020-02-26 ENCOUNTER — Encounter: Payer: Self-pay | Admitting: Endocrinology

## 2020-02-26 ENCOUNTER — Other Ambulatory Visit: Payer: Self-pay

## 2020-02-26 VITALS — BP 144/64 | HR 87 | Ht 63.5 in | Wt 162.2 lb

## 2020-02-26 DIAGNOSIS — E782 Mixed hyperlipidemia: Secondary | ICD-10-CM | POA: Diagnosis not present

## 2020-02-26 DIAGNOSIS — I10 Essential (primary) hypertension: Secondary | ICD-10-CM | POA: Diagnosis not present

## 2020-02-26 DIAGNOSIS — E119 Type 2 diabetes mellitus without complications: Secondary | ICD-10-CM

## 2020-02-26 DIAGNOSIS — E876 Hypokalemia: Secondary | ICD-10-CM | POA: Diagnosis not present

## 2020-02-26 LAB — GLUCOSE, POCT (MANUAL RESULT ENTRY): POC Glucose: 86 mg/dl (ref 70–99)

## 2020-02-26 NOTE — Patient Instructions (Signed)
Check blood sugars on waking up days a week  Also check blood sugars about 2 hours after meals and do this after different meals by rotation  Recommended blood sugar levels on waking up are 80-130 and about 2 hours after meal is 130-160  Please bring your blood sugar monitor to each visit, thank you  Bring BP meter to office

## 2020-02-26 NOTE — Progress Notes (Signed)
Patient ID: April [name], female   DOB: 07-15-1941, 79 y.o.   MRN: [id number]   Reason for Appointment: follow-up of various problems  History of Present Illness    HYPERTENSION:  diagnosed around 1979 with symptoms of headaches Previously she was taking Avapro, clonidine 0.6 at bedtime and Dyazide but blood pressure was relatively high with this regimen  For evaluation of her hyperaldosteronism she was switched to labetalol 100 mg twice a day, Tenex 2 mg and doxazosin With this regimen her blood pressure has been much better She was subsequently started on Aldactone to help with her severe hypokalemia Also doxazosin was stopped  She is currently taking Aldactone 100 mg and labetalol 100 mg along with 12.5 mg hydrochlorothiazide   She has been checking her blood pressure at home, recently about 120/70 Blood pressure was higher in the office today but she thinks it was relatively lower with her rheumatologist   BP Readings from Last 3 Encounters:  02/26/20 (!) 144/64  01/15/20 (!) 150/81  10/17/19 138/68   RENAL function has been slightly variable  Lab Results  Component Value Date   CREATININE 1.15 02/24/2020   CREATININE 1.08 (H) 01/15/2020   CREATININE 1.34 (H) 10/09/2019      HYPOKALEMIA:   This previously had been a significant longstanding problem and associated with hypertension  Previously had  been prescribed 6 tablets of potassium daily She was off her diuretics and Avapro when her evaluation for hyperaldosteronism was done, aldosterone level was 6.0 along with a relatively low renin level  She has a normal potassium level consistently with Aldactone 100 mg daily along with 1/2 tablet of potassium 20 mEq daily  Lab Results  Component Value Date   K 3.8 02/24/2020     DIABETES type II:   She has had diabetes since at least 2015 This has been  well controlled with a regimen of metformin 1 g twice a day   A1c has been consistently  upper normal, now 5.7  She again has not brought her monitor for download today She probably checks blood sugars only in the mornings and infrequently and she thinks they are fairly close to normal with highest reading only about 113 Lab glucose was 99  She takes Metformin 1 g twice a day with food and usually has no side effects with this She has tried to do a little more of walking, usually able to go 1/2- 1 block but has limitations She feels that her diet is reasonably controlled and low-fat   Weight history:   Wt Readings from Last 3 Encounters:  02/26/20 162 lb 3.2 oz (73.6 kg)  01/15/20 158 lb 12.8 oz (72 kg)  10/17/19 162 lb 12.8 oz (73.8 kg)      Lab Results  Component Value Date   HGBA1C 5.7 02/24/2020   HGBA1C 5.8 10/09/2019   HGBA1C 6.1 06/06/2019   Lab Results  Component Value Date   MICROALBUR 1.9 02/24/2020   LDLCALC 96 02/24/2020   CREATININE 1.15 02/24/2020    OTHER active problems discussed today are in review of systems  LABS:  Office Visit on 02/26/2020  Component Date Value Ref Range Status  . POC Glucose 02/26/2020 86  70 - 99 mg/dl Final  Lab on 02/40/9735  Component Date Value Ref Range Status  . Fructosamine 02/24/2020 211  0 - 285 umol/L Final   Comment: Published reference interval for apparently healthy subjects between age 66 and 48 is 31 -  285 umol/L and in a poorly controlled diabetic population is 228 - 563 umol/L with a mean of 396 umol/L.   [name] Color, Urine 02/24/2020 YELLOW  Yellow;Lt. Yellow;Straw;Dark Yellow;Amber;Green;Red;Brown Final  . APPearance 02/24/2020 CLEAR  Clear;Turbid;Slightly Cloudy;Cloudy Final  . Specific Gravity, Urine 02/24/2020 1.020  1.000 - 1.030 Final  . pH 02/24/2020 5.5  5.0 - 8.0 Final  . Total Protein, Urine 02/24/2020 NEGATIVE  Negative Final  . Urine Glucose 02/24/2020 NEGATIVE  Negative Final  . Ketones, ur 02/24/2020 NEGATIVE  Negative Final  . Bilirubin Urine 02/24/2020 NEGATIVE  Negative Final    . Hgb urine dipstick 02/24/2020 NEGATIVE  Negative Final  . Urobilinogen, UA 02/24/2020 0.2  0.0 - 1.0 Final  . Leukocytes,Ua 02/24/2020 NEGATIVE  Negative Final  . Nitrite 02/24/2020 NEGATIVE  Negative Final  . WBC, UA 02/24/2020 0-2/hpf  0-2/hpf Final  . RBC / HPF 02/24/2020 none seen  0-2/hpf Final  . Squamous Epithelial / LPF 02/24/2020 Rare(0-4/hpf)  Rare(0-4/hpf) Final  . Microalb, Ur 02/24/2020 1.9  0.0 - 1.9 mg/dL Final  . Creatinine,U 02/24/2020 169.9  mg/dL Final  . Microalb Creat Ratio 02/24/2020 1.1  0.0 - 30.0 mg/g Final  . Cholesterol 02/24/2020 180  0 - 200 mg/dL Final   ATP III Classification       Desirable:  < 200 mg/dL               Borderline High:  200 - 239 mg/dL          High:  > = 240 mg/dL  . Triglycerides 02/24/2020 65.0  0.0 - 149.0 mg/dL Final   Normal:  <150 mg/dLBorderline High:  150 - 199 mg/dL  . HDL 02/24/2020 71.20  >39.00 mg/dL Final  . VLDL 02/24/2020 13.0  0.0 - 40.0 mg/dL Final  . LDL Cholesterol 02/24/2020 96  0 - 99 mg/dL Final  . Total CHOL/HDL Ratio 02/24/2020 3   Final                  Men          Women1/2 Average Risk     3.4          3.3Average Risk          5.0          4.42X Average Risk          9.6          7.13X Average Risk          15.0          11.0                      . NonHDL 02/24/2020 109.10   Final   NOTE:  Non-HDL goal should be 30 mg/dL higher than patient's LDL goal (i.e. LDL goal of < 70 mg/dL, would have non-HDL goal of < 100 mg/dL)  . Sodium 02/24/2020 137  135 - 145 mEq/L Final  . Potassium 02/24/2020 3.8  3.5 - 5.1 mEq/L Final  . Chloride 02/24/2020 103  96 - 112 mEq/L Final  . CO2 02/24/2020 25  19 - 32 mEq/L Final  . Glucose, Bld 02/24/2020 99  70 - 99 mg/dL Final  . BUN 02/24/2020 21  6 - 23 mg/dL Final  . Creatinine, Ser 02/24/2020 1.15  0.40 - 1.20 mg/dL Final  . Total Bilirubin 02/24/2020 0.7  0.2 - 1.2 mg/dL Final  . Alkaline Phosphatase 02/24/2020 87  39 - 117  U/L Final  . AST 02/24/2020 22  0 - 37 U/L Final   . ALT 02/24/2020 24  0 - 35 U/L Final  . Total Protein 02/24/2020 7.1  6.0 - 8.3 g/dL Final  . Albumin 16/09/9603 4.1  3.5 - 5.2 g/dL Final  . GFR 54/08/8118 55.40* >60.00 mL/min Final  . Calcium 02/24/2020 9.3  8.4 - 10.5 mg/dL Final  . Hgb J4N MFr Bld 02/24/2020 5.7  4.6 - 6.5 % Final   Glycemic Control Guidelines for People with Diabetes:Non Diabetic:  <6%Goal of Therapy: <7%Additional Action Suggested:  >8%     Allergies as of 02/26/2020      Reactions   Acetaminophen Nausea Only   Actos [pioglitazone Hydrochloride]    edema   Aspirin Nausea Only   Atorvastatin Other (See Comments)   Felt drunk/high floating   Morphine Sulfate Other (See Comments)   Burns injecting "arm is on fire"   Naproxen Sodium Nausea Only   Sitagliptin Phosphate Nausea And Vomiting      Medication List       Accurate as of February 26, 2020 11:33 AM. If you have any questions, ask your nurse or doctor.        Accu-Chek Compact Plus test strip Generic drug: glucose blood Use to check blood sugar once a day dx code E11.9   Accu-Chek Softclix Lancets lancets Use to check blood sugar once a day dx code E11.9   amitriptyline 25 MG tablet Commonly known as: ELAVIL Take 25 mg by mouth at bedtime.   ezetimibe 10 MG tablet Commonly known as: ZETIA Take 10 mg by mouth daily.   Ferrex 150 150 MG capsule Generic drug: iron polysaccharides TAKE 1 CAPSULE BY MOUTH TWICE A DAY   folic acid 0.5 MG tablet Commonly known as: FOLVITE Take 0.5 mg by mouth daily.   gabapentin 300 MG capsule Commonly known as: NEURONTIN Take 300 mg by mouth daily.   Ginger 500 MG Caps Take 1 capsule by mouth daily.   hydrochlorothiazide 12.5 MG capsule Commonly known as: MICROZIDE Take 1 capsule by mouth once daily   Klor-Con M20 20 MEQ tablet Generic drug: potassium chloride SA TAKE 1 TABLET BY MOUTH EVERY DAY   labetalol 100 MG tablet Commonly known as: NORMODYNE TAKE 1 TABLET BY MOUTH TWICE A DAY    metFORMIN 1000 MG tablet Commonly known as: GLUCOPHAGE TAKE 1 TABLET BY MOUTH TWICE A DAY   methotrexate 2.5 MG tablet Commonly known as: RHEUMATREX Take 15 mg by mouth once a week. Patient takes 3 tablets twice once a week to add up to be 6 tablets once weekly, starts with 2 tablets in the evening and takes 2 more tablets in the morning   montelukast 10 MG tablet Commonly known as: SINGULAIR Take 10 mg by mouth daily.   NONFORMULARY OR COMPOUNDED ITEM [facility] compound:  Peripheral Neuropathy cream - Bu[ovacaome 1%, Doxepin 3%, Gabapentin 6%, Pentoxifylline 3%, Topiramate 1%, dispense 120grams, apply 1-2 grams to affected area 3-4 times daily, + 3 refills.   Nucynta 50 MG tablet Generic drug: tapentadol Take 50 mg by mouth as needed for severe pain (spasms).   pravastatin 80 MG tablet Commonly known as: PRAVACHOL TAKE 1 TABLET BY MOUTH DAILY   spironolactone 100 MG tablet Commonly known as: ALDACTONE TAKE 1 TABLET BY MOUTH EVERY DAY   Turmeric 450 MG Caps Take 1 capsule by mouth daily.   vitamin B-12 1000 MCG tablet Commonly known as: CYANOCOBALAMIN Take 1,000 mcg  by mouth daily.       Allergies:  Allergies  Allergen Reactions  . Acetaminophen Nausea Only  . Actos [Pioglitazone Hydrochloride]     edema  . Aspirin Nausea Only  . Atorvastatin Other (See Comments)    Felt drunk/high floating  . Morphine Sulfate Other (See Comments)    Burns injecting "arm is on fire"  . Naproxen Sodium Nausea Only  . Sitagliptin Phosphate Nausea And Vomiting    Past Medical History:  Diagnosis Date  . Allergic rhinitis   . Diabetes mellitus    TYPE 2  . Dyslipidemia   . Hyperkalemia   . Hypertension   . Insomnia    Pt stated no trouble falling or staying asleep.  . RA (rheumatoid arthritis) (HCC)     Past Surgical History:  Procedure Laterality Date  . BREAST CYST EXCISION Right 1967  . HAND TENDON SURGERY     Right  . OVARIAN CYST SURGERY Right 1988     Family History  Problem Relation Age of Onset  . Diabetes Mother   . Diabetes Father   . Cancer Father        Prostate  . Diabetes Sister   . Stroke Maternal Grandmother        Cause of death  . Healthy Brother     Social History:  reports that she has never smoked. She has never used smokeless tobacco. She reports that she does not drink alcohol or use drugs.  Review of Systems:   History of rheumatoid arthritis, on  Methotrexate, followed by rheumatologist  Vitamin D deficiency: She is taking vitamin D3, 1000 U daily  LIPIDS: Taking Zetia from PCP and is also on pravastatin 80 mg Recent LDL 96   Lab Results  Component Value Date   CHOL 180 02/24/2020   HDL 71.20 02/24/2020   LDLCALC 96 02/24/2020   LDLDIRECT 85.0 10/09/2019   TRIG 65.0 02/24/2020   CHOLHDL 3 02/24/2020   Lab Results  Component Value Date   ALT 24 02/24/2020   ALT 24 01/15/2020   ALT 23 09/11/2017      Examination:   BP (!) 144/64 (BP Location: Left Arm, Patient Position: Sitting, Cuff Size: Normal)   Pulse 87   Ht 5' 3.5" (1.613 m)   Wt 162 lb 3.2 oz (73.6 kg)   SpO2 96%   BMI 28.28 kg/m   Body mass index is 28.28 kg/m.    Standing blood pressure 142/70 left arm  Assesment/PLAN:  Hypertension:  Blood pressure is usually well controlled, high normal in the office today possibly from being rushed However she reports better blood pressure readings at home  She is on a 3 drug regimen of  Aldactone 100 mg, labetalol and  HCTZ   RENAL dysfunction: Her creatinine is improving compared to last year No evidence of microalbuminuria on recent testing  Hypokalemia: She has a consistently normal potassium level with 100 mg Aldactone and also with only 10 mEq of potassium  This is likely from renal potassium wasting and not hyperaldosteronism   DIABETES: Treated with Metformin 1 g twice daily Has been consistently well controlled She has maintained her weight and recently trying to be  a little more active A1c is lower than expected but fairly good at 5.7 She will also need to monitor her blood sugars periodically at home and reminded her to do some readings after meals also  HYPERCHOLESTEROLEMIA: Well-controlled on Zetia and pravastatin   She will follow-up in 4  months   There are no Patient Instructions on file for this visit.   Reather Littler 02/26/2020, 11:33 AM

## 2020-03-03 ENCOUNTER — Other Ambulatory Visit: Payer: Self-pay | Admitting: Endocrinology

## 2020-03-10 ENCOUNTER — Other Ambulatory Visit: Payer: Self-pay

## 2020-03-10 NOTE — Patient Outreach (Signed)
[facility]) Care Management Chronic Special Needs Program  03/10/2020  Name: April [name] DOB: 1941-03-15  MRN: [id number]  April [name] is enrolled in a chronic special needs plan for Diabetes. Chronic Care Management Coordinator telephoned client to review health risk assessment and to update individualized care plan.  RNCM reviewed the chronic care management program, importance of client participation, and taking their care plan to all provider appointments and inpatient facilities.   Subjective: client reports her last A1C was 5.7. she states blood sugar 92 today. Client denies any difficulty obtaining medications. She reports she would like to increase her activity.   Goals Addressed            This Visit's Progress   . COMPLETED:  Acknowledge receipt of Chief Executive Officer voice receiving packet.    . Client will report no worsening of symptoms related to heart disease within the next 9 months   On track    Please take your medications as prescribed. Please attend provider visits as scheduled. Mailed education: Heart disease in diabetics. Please review and call if you have any questions.     . Client will verbalize knowledge of self management of Hypertension as evidences by BP reading of 140/90 or less; or as defined by provider   No change    Per chart review BP 02/26/20 144/64; 01/15/20 150/80; 10/17/2019 138/68  Discussed importance of maintaining and keeping track of blood pressure. You can use your [facility] calender to record your results. Follow up with your doctor as scheduled. Take your medications as prescribed by your doctor. Ask your doctor "what is my target blood pressure range". Monitor your blood pressure and take results to your doctor's appointment.  Monitor the amount of salt you are eating. Continue to exercise as tolerated and remain active. Eat heart healthy diet (full of fruits, vegetables, whole grains,  lean protein, water--limit salt, fat, and sugar/simple carb intake).  Mailed education: "high blood pressure in adults", "checking your blood pressure at home" and "DASH diet". Please review and call if you have any questions.    [name] HEMOGLOBIN A1C < 7.0       A1C 5.7 on 02/24/20 Great Job!  Diabetes self management actions:  Glucose monitoring per provider recommendations  Eat Healthy  Visit provider every 3-6 months as directed  Hbg A1C level every 3-6 months.  Ask your doctor, What is my Target A1C goal?  Ask your doctor, What is my Target blood glucose range?"  Take your medications as recommended and if you have any questions call your doctor.    . Increase physical activity-Work up to 30 minutes/day at least 5 days/week (pt-stated)       Discussed the benefits of exercise. Mailed education: "walking to fitness" and exercise and aging". Please review and call if you have any questions.    . COMPLETED: Maintain timely refills of diabetic medication as prescribed within the year .       Client denies any difficulty with obtaining medications.    . COMPLETED: Obtain annual  Lipid Profile, LDL-C       Done 02/24/2020    . Obtain Annual Eye (retinal)  Exam    On track    It is important to follow up with your provider for recommended exam.    . COMPLETED: Obtain Annual Foot Exam       Client reports done 03/02/2020    . Obtain annual  screen for micro albuminuria (urine) , nephropathy (kidney problems)   On track    Done 02/07/2019  Renew goal 2021: It is important to follow up with your provider for recommended labs. Please take your medications as prescribed. Try to avoid saturated fats, trans-fats, and eat more fiber. Mailed education: "Lipid test". Please review and call is if you have any questions.     . Obtain Hemoglobin A1C at least 2 times per year   On track    A1C 02/24/20 5.7; 10/09/19 A1C 5.8; 06/06/19 A1C 6.1; 02/07/19 A1C 5.9  Goal renewed 2021: It is important to  follow up with your provider for recommended labs.    . Visit Primary Care Provider or Endocrinologist at least 2 times per year    On track    Endocrinologist visits: 03/02/2020; 10/17/2019; 06/13/2019, 02/11/2019 Primary care provider seen 11/15/2019, 09/20/2019, 08/29/2019, 06/28/2019  Goal renewed 2021: It is important to follow up with providers as scheduled for recommended labs, procedures and medication refills.      Discussed covid 19 precautions. Client reports she is fully vaccinnated. Reinforced wearing face mask, social distancing and handwashing.  Reinforced 24 hour nurse advice line, reiterated the availability of the health care concierge for benefits questions. Encouraged client to call RNCM as needed.  Plan: send updated care plan to primary care; send updated care plan to client; send educational material. RNCM will outreach per tier level within 9-12 months. Client to call RNCM will any questions or concerns.   Kathyrn Sheriff, RN, MSN, [facility] Chronic Care Management Coordinator [facility] [phone]

## 2020-03-11 DIAGNOSIS — G894 Chronic pain syndrome: Secondary | ICD-10-CM | POA: Diagnosis not present

## 2020-03-11 DIAGNOSIS — M545 Low back pain: Secondary | ICD-10-CM | POA: Diagnosis not present

## 2020-04-22 DIAGNOSIS — M0589 Other rheumatoid arthritis with rheumatoid factor of multiple sites: Secondary | ICD-10-CM | POA: Diagnosis not present

## 2020-04-22 DIAGNOSIS — M150 Primary generalized (osteo)arthritis: Secondary | ICD-10-CM | POA: Diagnosis not present

## 2020-04-22 DIAGNOSIS — Z79899 Other long term (current) drug therapy: Secondary | ICD-10-CM | POA: Diagnosis not present

## 2020-04-22 DIAGNOSIS — M549 Dorsalgia, unspecified: Secondary | ICD-10-CM | POA: Diagnosis not present

## 2020-04-22 DIAGNOSIS — M8580 Other specified disorders of bone density and structure, unspecified site: Secondary | ICD-10-CM | POA: Diagnosis not present

## 2020-04-22 DIAGNOSIS — N289 Disorder of kidney and ureter, unspecified: Secondary | ICD-10-CM | POA: Diagnosis not present

## 2020-04-22 DIAGNOSIS — E119 Type 2 diabetes mellitus without complications: Secondary | ICD-10-CM | POA: Diagnosis not present

## 2020-04-27 DIAGNOSIS — Z79899 Other long term (current) drug therapy: Secondary | ICD-10-CM | POA: Diagnosis not present

## 2020-04-27 DIAGNOSIS — N289 Disorder of kidney and ureter, unspecified: Secondary | ICD-10-CM | POA: Diagnosis not present

## 2020-04-27 DIAGNOSIS — M150 Primary generalized (osteo)arthritis: Secondary | ICD-10-CM | POA: Diagnosis not present

## 2020-04-27 DIAGNOSIS — M549 Dorsalgia, unspecified: Secondary | ICD-10-CM | POA: Diagnosis not present

## 2020-04-27 DIAGNOSIS — M8580 Other specified disorders of bone density and structure, unspecified site: Secondary | ICD-10-CM | POA: Diagnosis not present

## 2020-04-27 DIAGNOSIS — M7551 Bursitis of right shoulder: Secondary | ICD-10-CM | POA: Diagnosis not present

## 2020-04-27 DIAGNOSIS — E119 Type 2 diabetes mellitus without complications: Secondary | ICD-10-CM | POA: Diagnosis not present

## 2020-04-27 DIAGNOSIS — M0589 Other rheumatoid arthritis with rheumatoid factor of multiple sites: Secondary | ICD-10-CM | POA: Diagnosis not present

## 2020-05-06 ENCOUNTER — Other Ambulatory Visit: Payer: Self-pay | Admitting: Endocrinology

## 2020-05-23 ENCOUNTER — Other Ambulatory Visit: Payer: Self-pay | Admitting: Endocrinology

## 2020-05-27 DIAGNOSIS — E1165 Type 2 diabetes mellitus with hyperglycemia: Secondary | ICD-10-CM | POA: Diagnosis not present

## 2020-05-27 DIAGNOSIS — M069 Rheumatoid arthritis, unspecified: Secondary | ICD-10-CM | POA: Diagnosis not present

## 2020-05-27 DIAGNOSIS — E1140 Type 2 diabetes mellitus with diabetic neuropathy, unspecified: Secondary | ICD-10-CM | POA: Diagnosis not present

## 2020-05-27 DIAGNOSIS — M1990 Unspecified osteoarthritis, unspecified site: Secondary | ICD-10-CM | POA: Diagnosis not present

## 2020-05-27 DIAGNOSIS — D638 Anemia in other chronic diseases classified elsewhere: Secondary | ICD-10-CM | POA: Diagnosis not present

## 2020-05-27 DIAGNOSIS — E7800 Pure hypercholesterolemia, unspecified: Secondary | ICD-10-CM | POA: Diagnosis not present

## 2020-05-27 DIAGNOSIS — I10 Essential (primary) hypertension: Secondary | ICD-10-CM | POA: Diagnosis not present

## 2020-06-10 DIAGNOSIS — M069 Rheumatoid arthritis, unspecified: Secondary | ICD-10-CM | POA: Diagnosis not present

## 2020-06-10 DIAGNOSIS — G894 Chronic pain syndrome: Secondary | ICD-10-CM | POA: Diagnosis not present

## 2020-06-29 ENCOUNTER — Other Ambulatory Visit (INDEPENDENT_AMBULATORY_CARE_PROVIDER_SITE_OTHER): Payer: HMO

## 2020-06-29 ENCOUNTER — Other Ambulatory Visit: Payer: Self-pay

## 2020-06-29 DIAGNOSIS — E119 Type 2 diabetes mellitus without complications: Secondary | ICD-10-CM | POA: Diagnosis not present

## 2020-06-29 LAB — BASIC METABOLIC PANEL
BUN: 19 mg/dL (ref 6–23)
CO2: 27 mEq/L (ref 19–32)
Calcium: 9.7 mg/dL (ref 8.4–10.5)
Chloride: 103 mEq/L (ref 96–112)
Creatinine, Ser: 1.04 mg/dL (ref 0.40–1.20)
GFR: 62.16 mL/min (ref >60.00)
Glucose, Bld: 92 mg/dL (ref 70–99)
Potassium: 3.4 mEq/L — ABNORMAL LOW (ref 3.5–5.1)
Sodium: 138 mEq/L (ref 135–145)

## 2020-06-29 LAB — HEMOGLOBIN A1C: Hgb A1c MFr Bld: 5.9 % (ref 4.6–6.5)

## 2020-06-30 LAB — FRUCTOSAMINE: Fructosamine: 235 umol/L (ref 0–285)

## 2020-07-01 NOTE — Progress Notes (Signed)
Patient ID: April [name], female   DOB: [birth year], 79 y.o.   MRN: [id number]   Reason for Appointment: follow-up of various problems  History of Present Illness    HYPERTENSION:  diagnosed around 1979 with symptoms of headaches Previously she was taking Avapro, clonidine 0.6 at bedtime and Dyazide but blood pressure was relatively high with this regimen  For evaluation of her hyperaldosteronism she was switched to labetalol 100 mg twice a day, Tenex 2 mg and doxazosin With this regimen her blood pressure has been much better She was subsequently started on Aldactone to help with her severe hypokalemia and doxazosin was stopped  She is taking Aldactone 100 mg, labetalol 100 mg twice a day along with 12.5 mg hydrochlorothiazide   She has been checking her blood pressure at home, usually in the 120s/70s   BP Readings from Last 3 Encounters:  07/02/20 128/72  02/26/20 (!) 144/64  01/15/20 (!) 150/81   RENAL function has been close to normal consistently   Lab Results  Component Value Date   CREATININE 1.04 06/29/2020   CREATININE 1.15 02/24/2020   CREATININE 1.08 (H) 01/15/2020      HYPOKALEMIA:   This previously had been a significant longstanding problem and associated with hypertension  Previously had  been prescribed 6 tablets of potassium daily She was off her diuretics and Avapro when her evaluation for hyperaldosteronism was done, aldosterone level was 6.0 along with a relatively low renin level  She has a normal potassium level usually with Aldactone 100 mg daily along with 1/2 tablet of potassium 20 mEq daily Thyroid is slightly low now without any change in her medication regimen  Lab Results  Component Value Date   K 3.4 (L) 06/29/2020     DIABETES type II:   She has had diabetes since at least 2015 This has been  well controlled with a regimen of metformin 1 g twice a day   A1c has been consistently upper normal, now 5.9  She is  using an Accu-Chek meter which could not be downloaded She usually checks blood sugars mostly in the mornings Fasting range 99-116, after lunch 142 and after dinner 100 Lab glucose was 92  She takes Metformin 1 g twice a day with meals No GI side effects Not able to do much exercise because of hot weather, does a lot of walking with her dogs  She feels that her diet is well-balanced and has cut back on regular soft drinks, trying to drink more pomegranate juice and have some more fruit Weight is slightly improved   Weight history:   Wt Readings from Last 3 Encounters:  07/02/20 160 lb 9.6 oz (72.8 kg)  02/26/20 162 lb 3.2 oz (73.6 kg)  01/15/20 158 lb 12.8 oz (72 kg)      Lab Results  Component Value Date   HGBA1C 5.9 06/29/2020   HGBA1C 5.7 02/24/2020   HGBA1C 5.8 10/09/2019   Lab Results  Component Value Date   MICROALBUR 1.9 02/24/2020   LDLCALC 96 02/24/2020   CREATININE 1.04 06/29/2020    OTHER active problems discussed today are in review of systems  LABS:  Lab on 06/29/2020  Component Date Value Ref Range Status  . Fructosamine 06/29/2020 235  0 - 285 umol/L Final   Comment: Published reference interval for apparently healthy subjects between age 19 and 50 is 68 - 285 umol/L and in a poorly controlled diabetic population is 228 - 563 umol/L with a  mean of 396 umol/L.   [name] Sodium 06/29/2020 138  135 - 145 mEq/L Final  . Potassium 06/29/2020 3.4* 3.5 - 5.1 mEq/L Final  . Chloride 06/29/2020 103  96 - 112 mEq/L Final  . CO2 06/29/2020 27  19 - 32 mEq/L Final  . Glucose, Bld 06/29/2020 92  70 - 99 mg/dL Final  . BUN 20/09/711 19  6 - 23 mg/dL Final  . Creatinine, Ser 06/29/2020 1.04  0.40 - 1.20 mg/dL Final  . GFR 19/75/8832 62.16  >60.00 mL/min Final  . Calcium 06/29/2020 9.7  8.4 - 10.5 mg/dL Final  . Hgb P4D MFr Bld 06/29/2020 5.9  4.6 - 6.5 % Final   Glycemic Control Guidelines for People with Diabetes:Non Diabetic:  <6%Goal of Therapy: <7%Additional  Action Suggested:  >8%     Allergies as of 07/02/2020      Reactions   Acetaminophen Nausea Only   Actos [pioglitazone Hydrochloride]    edema   Aspirin Nausea Only   Atorvastatin Other (See Comments)   Felt drunk/high floating   Morphine Sulfate Other (See Comments)   Burns injecting "arm is on fire"   Naproxen Sodium Nausea Only   Sitagliptin Phosphate Nausea And Vomiting      Medication List       Accurate as of July 02, 2020  8:54 AM. If you have any questions, ask your nurse or doctor.        Accu-Chek Compact Plus test strip Generic drug: glucose blood Use to check blood sugar once a day dx code E11.9   Accu-Chek Softclix Lancets lancets Use to check blood sugar once a day dx code E11.9   amitriptyline 25 MG tablet Commonly known as: ELAVIL Take 25 mg by mouth at bedtime.   ezetimibe 10 MG tablet Commonly known as: ZETIA Take 10 mg by mouth daily.   Ferrex 150 150 MG capsule Generic drug: iron polysaccharides TAKE 1 CAPSULE BY MOUTH TWICE A DAY   folic acid 0.5 MG tablet Commonly known as: FOLVITE Take 0.5 mg by mouth daily.   gabapentin 300 MG capsule Commonly known as: NEURONTIN Take 300 mg by mouth daily.   Ginger 500 MG Caps Take 1 capsule by mouth daily.   hydrochlorothiazide 12.5 MG capsule Commonly known as: MICROZIDE Take 1 capsule by mouth once daily   labetalol 100 MG tablet Commonly known as: NORMODYNE TAKE 1 TABLET BY MOUTH TWICE A DAY   metFORMIN 1000 MG tablet Commonly known as: GLUCOPHAGE TAKE 1 TABLET BY MOUTH TWICE A DAY   methotrexate 2.5 MG tablet Commonly known as: RHEUMATREX Take 15 mg by mouth once a week. Patient takes 3 tablets twice once a week to add up to be 6 tablets once weekly, starts with 2 tablets in the evening and takes 2 more tablets in the morning   montelukast 10 MG tablet Commonly known as: SINGULAIR Take 10 mg by mouth daily.   NONFORMULARY OR COMPOUNDED ITEM [facility] compound:  Peripheral  Neuropathy cream - Bu[ovacaome 1%, Doxepin 3%, Gabapentin 6%, Pentoxifylline 3%, Topiramate 1%, dispense 120grams, apply 1-2 grams to affected area 3-4 times daily, + 3 refills.   Nucynta 50 MG tablet Generic drug: tapentadol Take 50 mg by mouth as needed for severe pain (spasms).   potassium chloride SA 20 MEQ tablet Commonly known as: Klor-Con M20 Take 1/2 tablet ( total) by mouth once daily.   pravastatin 80 MG tablet Commonly known as: PRAVACHOL TAKE 1 TABLET BY MOUTH DAILY  spironolactone 100 MG tablet Commonly known as: ALDACTONE TAKE 1 TABLET BY MOUTH EVERY DAY   Turmeric 450 MG Caps Take 1 capsule by mouth daily.   vitamin B-12 1000 MCG tablet Commonly known as: CYANOCOBALAMIN Take 1,000 mcg by mouth daily.       Allergies:  Allergies  Allergen Reactions  . Acetaminophen Nausea Only  . Actos [Pioglitazone Hydrochloride]     edema  . Aspirin Nausea Only  . Atorvastatin Other (See Comments)    Felt drunk/high floating  . Morphine Sulfate Other (See Comments)    Burns injecting "arm is on fire"  . Naproxen Sodium Nausea Only  . Sitagliptin Phosphate Nausea And Vomiting    Past Medical History:  Diagnosis Date  . Allergic rhinitis   . Diabetes mellitus    TYPE 2  . Dyslipidemia   . Hyperkalemia   . Hypertension   . Insomnia    Pt stated no trouble falling or staying asleep.  . RA (rheumatoid arthritis) (HCC)     Past Surgical History:  Procedure Laterality Date  . BREAST CYST EXCISION Right 1967  . HAND TENDON SURGERY     Right  . OVARIAN CYST SURGERY Right 1988    Family History  Problem Relation Age of Onset  . Diabetes Mother   . Diabetes Father   . Cancer Father        Prostate  . Diabetes Sister   . Stroke Maternal Grandmother        Cause of death  . Healthy Brother     Social History:  reports that she has never smoked. She has never used smokeless tobacco. She reports that she does not drink alcohol and does not use  drugs.  Review of Systems:   History of rheumatoid arthritis, on  Methotrexate, followed by rheumatologist Also takes gabapentin for pain  Vitamin D deficiency: She is taking vitamin D3, 1000 U daily  LIPIDS: Taking Zetia from PCP and is also on pravastatin 80 mg LDL 96   Lab Results  Component Value Date   CHOL 180 02/24/2020   HDL 71.20 02/24/2020   LDLCALC 96 02/24/2020   LDLDIRECT 85.0 10/09/2019   TRIG 65.0 02/24/2020   CHOLHDL 3 02/24/2020   Lab Results  Component Value Date   ALT 24 02/24/2020   ALT 24 01/15/2020   ALT 23 09/11/2017      Examination:   BP 128/72 (BP Location: Left Arm, Patient Position: Sitting, Cuff Size: Large)   Pulse 75   Ht 5' 3.5" (1.613 m)   Wt 160 lb 9.6 oz (72.8 kg)   SpO2 97%   BMI 28.00 kg/m   Body mass index is 28 kg/m.       Assesment/PLAN:  Hypertension:  Blood pressure is usually well controlled Also blood pressure has been good when checked elsewhere  She is on a 3 drug regimen of  Aldactone 100 mg, labetalol and  HCTZ 12.5 mg She will continue the same regimen  RENAL dysfunction: Her creatinine is fairly good and stable   Hypokalemia: She has a slightly low potassium level now, previously normal with 100 mg Aldactone This is likely from renal potassium wasting and not hyperaldosteronism Since she is taking only half tablet of the potassium she can go up to the whole tablet  DIABETES: Well controlled with Metformin 1 g twice daily A1c is 5.9 and fructosamine 235 Fasting readings are just over 100 but she has not done many readings after meals She  is usually trying to be as active as possible Watching her simple sugar intake better    She will follow-up in 4 months   There are no Patient Instructions on file for this visit.   Reather Littler 07/02/2020, 8:54 AM

## 2020-07-02 ENCOUNTER — Ambulatory Visit (INDEPENDENT_AMBULATORY_CARE_PROVIDER_SITE_OTHER): Payer: HMO | Admitting: Endocrinology

## 2020-07-02 ENCOUNTER — Other Ambulatory Visit: Payer: Self-pay

## 2020-07-02 ENCOUNTER — Encounter: Payer: Self-pay | Admitting: Endocrinology

## 2020-07-02 VITALS — BP 128/72 | HR 75 | Ht 63.5 in | Wt 160.6 lb

## 2020-07-02 DIAGNOSIS — E119 Type 2 diabetes mellitus without complications: Secondary | ICD-10-CM

## 2020-07-02 DIAGNOSIS — E782 Mixed hyperlipidemia: Secondary | ICD-10-CM

## 2020-07-02 DIAGNOSIS — E876 Hypokalemia: Secondary | ICD-10-CM | POA: Diagnosis not present

## 2020-07-02 DIAGNOSIS — I10 Essential (primary) hypertension: Secondary | ICD-10-CM

## 2020-07-02 NOTE — Patient Instructions (Addendum)
Take 1 pill of Potassium daily

## 2020-07-03 DIAGNOSIS — Z0000 Encounter for general adult medical examination without abnormal findings: Secondary | ICD-10-CM | POA: Diagnosis not present

## 2020-07-13 DIAGNOSIS — N289 Disorder of kidney and ureter, unspecified: Secondary | ICD-10-CM | POA: Diagnosis not present

## 2020-07-13 DIAGNOSIS — M8580 Other specified disorders of bone density and structure, unspecified site: Secondary | ICD-10-CM | POA: Diagnosis not present

## 2020-07-13 DIAGNOSIS — M150 Primary generalized (osteo)arthritis: Secondary | ICD-10-CM | POA: Diagnosis not present

## 2020-07-13 DIAGNOSIS — M7551 Bursitis of right shoulder: Secondary | ICD-10-CM | POA: Diagnosis not present

## 2020-07-13 DIAGNOSIS — E7800 Pure hypercholesterolemia, unspecified: Secondary | ICD-10-CM | POA: Diagnosis not present

## 2020-07-13 DIAGNOSIS — M0589 Other rheumatoid arthritis with rheumatoid factor of multiple sites: Secondary | ICD-10-CM | POA: Diagnosis not present

## 2020-07-13 DIAGNOSIS — Z79899 Other long term (current) drug therapy: Secondary | ICD-10-CM | POA: Diagnosis not present

## 2020-07-13 DIAGNOSIS — E119 Type 2 diabetes mellitus without complications: Secondary | ICD-10-CM | POA: Diagnosis not present

## 2020-07-13 DIAGNOSIS — M549 Dorsalgia, unspecified: Secondary | ICD-10-CM | POA: Diagnosis not present

## 2020-08-20 DIAGNOSIS — N6489 Other specified disorders of breast: Secondary | ICD-10-CM | POA: Diagnosis not present

## 2020-08-20 DIAGNOSIS — R2681 Unsteadiness on feet: Secondary | ICD-10-CM | POA: Diagnosis not present

## 2020-08-20 DIAGNOSIS — R296 Repeated falls: Secondary | ICD-10-CM | POA: Diagnosis not present

## 2020-08-21 ENCOUNTER — Ambulatory Visit
Admission: RE | Admit: 2020-08-21 | Discharge: 2020-08-21 | Disposition: A | Discharge status: Home / Self Care | Payer: HMO | Source: Ambulatory Visit | Attending: Family Medicine | Admitting: Family Medicine

## 2020-08-21 ENCOUNTER — Other Ambulatory Visit: Payer: Self-pay | Admitting: Family Medicine

## 2020-08-21 DIAGNOSIS — W19XXXA Unspecified fall, initial encounter: Secondary | ICD-10-CM

## 2020-08-21 DIAGNOSIS — R079 Chest pain, unspecified: Secondary | ICD-10-CM | POA: Diagnosis not present

## 2020-09-02 ENCOUNTER — Other Ambulatory Visit: Payer: Self-pay | Admitting: Endocrinology

## 2020-09-04 DIAGNOSIS — Z1231 Encounter for screening mammogram for malignant neoplasm of breast: Secondary | ICD-10-CM | POA: Diagnosis not present

## 2020-09-17 ENCOUNTER — Ambulatory Visit: Payer: HMO | Admitting: Physical Therapy

## 2020-10-01 ENCOUNTER — Other Ambulatory Visit: Payer: Self-pay

## 2020-10-01 NOTE — Patient Outreach (Signed)
[facility]) Care Management Chronic Special Needs Program  10/01/2020  Name: April [name] DOB: Feb 03, 1941  MRN: [id number]  [name] is enrolled in a chronic special needs plan for Diabetes. RNCM called to follow up, review and update care plan.  HRA completed.  Subjective: client reports she is seeing provider as recommended. She reports she has not been checking blood sugar, but has not had any signs/symptoms of low blood sugar. She state she has recently moved and states her glucose meter was packed away. She also reports it has been a long time since she has received a new meter. Last A1C 5.9 on 06/29/20.   Client reports several falls. She states the latest fall when she was standing up on a chair resulting in a large bruise to left breast. Client reports she has followed up with primary care. She reports it is better now. She states she is starting physical therapy sessions tomorrow.  Goals Addressed              This Visit's Progress   .  Client will report no worsening of symptoms related to heart disease within the next 9 months   On track     Denies any worsening symptoms related to heart disease. Annual cardiology visit 10/08/2020.    [name]  Client will verbalize knowledge of self management of Hypertension as evidences by BP reading of 140/90 or less; or as defined by provider   On track     Latest documented blood pressure 128/72.  Discussed blood pressure management and Encouraged client to continue self-management actions.  Continue Hypertension self-management actions: Follow up with your doctor as scheduled. Take your medications as prescribed by your doctor. Know your target blood pressure range. Monitor your blood pressure and take results to your doctor's appointment.  Monitor the amount of salt you are eating. Continue to remain active  Eat heart healthy diet (full of fruits, vegetables, whole grains, lean protein,  water--limit salt, fat, and sugar/simple carb intake).      [name]  HEMOGLOBIN A1C < 7        A1C 5.9 on 06/29/20   RNCM discussed glucose meter/supplies is a benefit of [employer] plan. RNCM completed warm transfer to health care concierge to discuss options for glucose meter covered on the plan.  Client to obtain new glucose meter. Client to discuss with health care concierge, then follow up with her doctor and request prescription for chosen meter and supplies.   Continue Diabetes self management actions:  Glucose monitoring per provider recommendations  Eat Healthy: eat low carbohydrate and low salt meals; watch portion sizes and avoid sugar sweetened drinks.    Visit provider every 3-6 months as directed.  Hbg A1C level every 3-6 months as directed.  Know your Target A1C goal.  Know your Target blood glucose range.  Take your medications as recommended and if you have any questions call your doctor.    .  Increase physical activity-Work up to 30 minutes/day at least 5 days/week (pt-stated)   On track     Discussed activity level. Great job increasing your activity level. Emphasized the importance of safe activities. Encouraged client to follow physical therapist recommendations regarding exercise and activity.     [name]  LIFESTYLE - DECREASE FALLS RISK   On track     Discussed fall prevention strategies. Discussed benefits of physical therapy and provided encouragement and positive feedback regarding attending sessions. Fall prevention brochure provided. Please review  and call if you have any questions. Inside your home: don't use throw rugs, use plenty of lighting, keep walkways clear of clutter and remove tripping hazards such as cords. Be aware of small pets when walking.     .  COMPLETED: Obtain Annual Eye (retinal)  Exam         Client reports done February/March of 2021.    [name]  COMPLETED: Obtain annual screen for micro albuminuria (urine) , nephropathy (kidney  problems)        Done 02/24/2020     .  COMPLETED: Obtain Hemoglobin A1C at least 2 times per year   On track     A1C 5.9 on 06/29/20 and A1C 5.7 on 02/24/20 5.7.     .  COMPLETED: Visit Primary Care Provider or Endocrinologist at least 2 times per year         Endocrinologist visits: 07/02/20 and 02/26/2020       Plan: RNCM will send updated care plan to client; send updated care plan to primary care provider. Warm transfer to health care concierge completed. RNCM will continue to follow. [employer] RNCM will outreach in 9-12 months per Tier level.      Kathyrn Sheriff, RN, MSN, [facility] Chronic Care Management Coordinator [facility] [phone]

## 2020-10-02 ENCOUNTER — Other Ambulatory Visit: Payer: Self-pay

## 2020-10-02 ENCOUNTER — Encounter: Payer: Self-pay | Admitting: Physical Therapy

## 2020-10-02 ENCOUNTER — Ambulatory Visit: Payer: HMO | Attending: Family Medicine | Admitting: Physical Therapy

## 2020-10-02 DIAGNOSIS — R2689 Other abnormalities of gait and mobility: Secondary | ICD-10-CM | POA: Diagnosis not present

## 2020-10-02 DIAGNOSIS — R296 Repeated falls: Secondary | ICD-10-CM | POA: Insufficient documentation

## 2020-10-02 NOTE — Therapy (Signed)
[facility] [address] [place], [place], [address] Phone: [phone]   Fax:  [phone]  Physical Therapy Evaluation  Patient Details  Name: April [name] MRN: [id number] Date of Birth: 07-Jan-1941 Referring Provider (PT): Dr Sigmund Hazel    Encounter Date: 10/02/2020   PT End of Session - 10/02/20 0942    Visit Number 1    Number of Visits 12    Date for PT Re-Evaluation 11/13/20    Authorization Type [employer]    PT Start Time 0932    PT Stop Time 1015    PT Time Calculation (min) 43 min    Activity Tolerance Patient tolerated treatment well    Behavior During Therapy [facility] for tasks assessed/performed           Past Medical History:  Diagnosis Date  . Allergic rhinitis   . Diabetes mellitus    TYPE 2  . Dyslipidemia   . Hyperkalemia   . Hypertension   . Insomnia    Pt stated no trouble falling or staying asleep.  . RA (rheumatoid arthritis) (HCC)     Past Surgical History:  Procedure Laterality Date  . BREAST CYST EXCISION Right 1967  . HAND TENDON SURGERY     Right  . OVARIAN CYST SURGERY Right 1988    There were no vitals filed for this visit.    Subjective Assessment - 10/02/20 0934    Subjective Patient has had frequent falls over the last few months. Her last fall was about a month and a half ago. she fell off a chair. She always feels like she falls to the left. She has also had some falls when she is walking her dog. She feels no syncope before her falls. She has RA. She is seeing a rhematologist.She sees Dr Ethelene Hal for her lower back. She has had neuropathy in the past but that has improved.    Pertinent History DMII, OA    How long can you walk comfortably? ussually walking when she falls    Patient Stated Goals to have less falls    Currently in Pain? No/denies   History of low back             [facility] PT Assessment - 10/02/20 0001      Assessment   Medical Diagnosis  Frequent Falls     Referring Provider (PT) Dr Sigmund Hazel     Hand Dominance Right    Next MD Visit Nothing scheduled     Prior Therapy None       Precautions   Precautions Fall      Restrictions   Weight Bearing Restrictions No      Balance Screen   Has the patient fallen in the past 6 months Yes    How many times? --   Hasa fall every few weeks    Has the patient had a decrease in activity level because of a fear of falling?  Yes    Is the patient reluctant to leave their home because of a fear of falling?  Yes      Home Environment   Living Environment Private residence    Additional Comments Has foster children at home;  Has 4 stpes into her new house       Prior Function   Level of Independence Independent    Vocation Other (comment)    Vocation Requirements Malen Gauze parent     Leisure walking her dogs  Cognition   Overall Cognitive Status Within Functional Limits for tasks assessed    Attention Focused    Focused Attention Appears intact    Memory Appears intact    Awareness Appears intact    Problem Solving Appears intact      Sensation   Light Touch Appears Intact    Additional Comments no neuropathy at this time       Posture/Postural Control   Posture Comments flexed psoture with gait       ROM / Strength   AROM / PROM / Strength AROM;PROM;Strength      Transfers   Comments 5x sit to stand 20 sec       Ambulation/Gait   Gait velocity 5 meter walk test 7, 7 and 8     Gait Comments has lateral sway to the L>R ; flexed psotutre in standing.                       Objective measurements completed on examination: See above findings.       [facility] Adult PT Treatment/Exercise - 10/02/20 0001      Exercises   Exercises Knee/Hip      Knee/Hip Exercises: Seated   Long Arc Quad Limitations LAQ 2x10 red     Heel Slides Limitations hamstring 2x10 red     Abd/Adduction Limitations red 2x10                   PT Education -  10/02/20 1202    Education Details benefits of balance training; HEP    Person(s) Educated Patient    Methods Explanation;Demonstration;Tactile cues;Verbal cues;Handout    Comprehension Verbalized understanding;Returned demonstration;Verbal cues required;Tactile cues required            PT Short Term Goals - 10/02/20 0949      PT SHORT TERM GOAL #1   Title Therapy will review FOTO    Time 2    Period Weeks    Status New    Target Date 10/16/20      PT SHORT TERM GOAL #2   Title Therapy will perfrom full BERG balance test    Time 1    Period Weeks    Status New    Target Date 10/09/20      PT SHORT TERM GOAL #3   Title Patient will improve sit to stand time by 8 seconds    Time 3    Period Weeks    Status New    Target Date 10/23/20      PT SHORT TERM GOAL #4   Title Patient will increase gross bilateral LE strength to 5/5    Time 3    Period Weeks    Status New    Target Date 10/23/20             PT Long Term Goals - 10/02/20 0951      PT LONG TERM GOAL #1   Title Patient will report no falls over a 3 week period    Time 6    Period Weeks    Status New    Target Date 11/13/20      PT LONG TERM GOAL #2   Title Patient will improve BERG score by 5 points    Baseline gosal will be adjusted when BERG perfromed ( not perfroemd 2nd to time)    Time 6    Period Weeks    Status New    Target Date  11/13/20      PT LONG TERM GOAL #3   Title Patient will walk her dog without feeling off balanced or having falss.    Time 6    Period Weeks    Status New    Target Date 11/13/20                  Plan - 10/02/20 0954    Clinical Impression Statement Patient is a 4 year oldfemale with frequent falls. Her falls seem to happen when she is not paying attention. She has RA which causes her increased pain at times. Her 5x sit to stand time puts her at a fall trisk. She has decreased walking speed. We will perfrom a full BERG balance test next visit. She  required min a for tandem stance with eyes open. She would benefit from skilled therapy to decrease her fall rsik. She is very active and would like to continue to be active.    Personal Factors and Comorbidities Comorbidity 1;Comorbidity 2;Comorbidity 3+    Comorbidities osteoperosis, RA, back pain    Examination-Activity Limitations Locomotion Level;Carry;Lift;Stairs;Squat    Examination-Participation Restrictions Community Activity;Shop    Stability/Clinical Decision Making Evolving/Moderate complexity   invcreased falls recently   Clinical Decision Making Moderate    Rehab Potential Excellent    PT Frequency 1x / week    PT Duration 6 weeks    PT Treatment/Interventions ADLs/Self Care Home Management;Gait training;Stair training;Therapeutic activities;Therapeutic exercise;Functional mobility training;Balance training;DME Instruction;Neuromuscular re-education;Patient/family education;Manual techniques;Passive range of motion;Taping    PT Next Visit Plan BERG balance test next visit; review FOTO; begin balance training; progress to air-ex; work on things like hurdles to have her lft her feet; consder Tnneti eexercises for balance    PT Home Exercise Plan laq, hamstring curl calmshell all given for genral strengthening    Consulted and Agree with Plan of Care Patient           Patient will benefit from skilled therapeutic intervention in order to improve the following deficits and impairments:  Abnormal gait, Decreased range of motion, Decreased endurance, Decreased strength, Decreased mobility, Decreased balance  Visit Diagnosis: Frequent falls  Other abnormalities of gait and mobility     Problem List Patient Active Problem List   Diagnosis Date Noted  . Iron deficiency anemia 01/20/2019  . Encounter for long-term (current) use of other medications 02/29/2012  . Hypertension   . Diabetes mellitus   . Hypokalemia   . RA (rheumatoid arthritis) (HCC)   . EDEMA 08/27/2010  .  CARPAL TUNNEL SYNDROME, BILATERAL 10/28/2009  . NUMBNESS 10/28/2009  . Diabetes mellitus without complication (HCC) 09/20/2007  . Hyperlipidemia 09/19/2007  . ALLERGIC RHINITIS 09/19/2007  . INSOMNIA 09/19/2007    Dessie Coma PT DPT  10/02/2020, 12:03 PM  [facility] [address] [place], [place], [address] Phone: [phone]   Fax:  [phone]  Name: April [name] MRN: [id number] Date of Birth: [birth year]

## 2020-10-08 ENCOUNTER — Ambulatory Visit: Payer: HMO | Admitting: Cardiovascular Disease

## 2020-10-11 ENCOUNTER — Encounter: Payer: Self-pay | Admitting: Cardiovascular Disease
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
## 2020-10-12 ENCOUNTER — Ambulatory Visit: Payer: HMO | Admitting: Cardiovascular Disease

## 2020-10-12 ENCOUNTER — Encounter: Payer: Self-pay | Admitting: Cardiovascular Disease

## 2020-10-12 ENCOUNTER — Other Ambulatory Visit: Payer: Self-pay

## 2020-10-12 VITALS — BP 136/78 | HR 70 | Ht 63.5 in | Wt 156.0 lb

## 2020-10-12 DIAGNOSIS — Z79899 Other long term (current) drug therapy: Secondary | ICD-10-CM | POA: Diagnosis not present

## 2020-10-12 DIAGNOSIS — I10 Essential (primary) hypertension: Secondary | ICD-10-CM

## 2020-10-12 DIAGNOSIS — E7800 Pure hypercholesterolemia, unspecified: Secondary | ICD-10-CM | POA: Diagnosis not present

## 2020-10-12 DIAGNOSIS — M0589 Other rheumatoid arthritis with rheumatoid factor of multiple sites: Secondary | ICD-10-CM | POA: Diagnosis not present

## 2020-10-12 DIAGNOSIS — M7551 Bursitis of right shoulder: Secondary | ICD-10-CM | POA: Diagnosis not present

## 2020-10-12 DIAGNOSIS — M8580 Other specified disorders of bone density and structure, unspecified site: Secondary | ICD-10-CM | POA: Diagnosis not present

## 2020-10-12 DIAGNOSIS — M549 Dorsalgia, unspecified: Secondary | ICD-10-CM | POA: Diagnosis not present

## 2020-10-12 DIAGNOSIS — N289 Disorder of kidney and ureter, unspecified: Secondary | ICD-10-CM | POA: Diagnosis not present

## 2020-10-12 DIAGNOSIS — E119 Type 2 diabetes mellitus without complications: Secondary | ICD-10-CM | POA: Diagnosis not present

## 2020-10-12 DIAGNOSIS — M150 Primary generalized (osteo)arthritis: Secondary | ICD-10-CM | POA: Diagnosis not present

## 2020-10-12 NOTE — Patient Instructions (Signed)
Medication Instructions:  Your physician recommends that you continue on your current medications as directed. Please refer to the Current Medication list given to you today.  *If you need a refill on your cardiac medications before your next appointment, please call your pharmacy*   Lab Work: none If you have labs (blood work) drawn today and your tests are completely normal, you will receive your results only by: MyChart Message (if you have MyChart) OR A paper copy in the mail If you have any lab test that is abnormal or we need to change your treatment, we will call you to review the results.   Testing/Procedures: none   Follow-Up: At [facility], you and your health needs are our priority.  As part of our continuing mission to provide you with exceptional heart care, we have created designated Provider Care Teams.  These Care Teams include your primary Cardiologist (physician) and Advanced Practice Providers (APPs -  Physician Assistants and Nurse Practitioners) who all work together to provide you with the care you need, when you need it.  We recommend signing up for the patient portal called "MyChart".  Sign up information is provided on this After Visit Summary.  MyChart is used to connect with patients for Virtual Visits (Telemedicine).  Patients are able to view lab/test results, encounter notes, upcoming appointments, etc.  Non-urgent messages can be sent to your provider as well.   To learn more about what you can do with MyChart, go to [email or web address].    Your next appointment:   1 year(s)  The format for your next appointment:   In Person  Provider:   Philip Nahser, MD   Other Instructions   

## 2020-10-13 ENCOUNTER — Other Ambulatory Visit: Payer: Self-pay

## 2020-10-13 ENCOUNTER — Ambulatory Visit: Payer: HMO | Attending: Family Medicine

## 2020-10-13 DIAGNOSIS — R2689 Other abnormalities of gait and mobility: Secondary | ICD-10-CM | POA: Insufficient documentation

## 2020-10-13 DIAGNOSIS — R296 Repeated falls: Secondary | ICD-10-CM | POA: Insufficient documentation

## 2020-10-13 NOTE — Patient Outreach (Signed)
  [facility]) Care Management Chronic Special Needs Program    10/13/2020  Name: AANVI VOYLES, DOB: 1941/07/27  MRN: [id number]   [facility] Care Management will continue to provide services for this member through 12/04/2020. The [employer] Care Management Team will assume care 12/05/2020.   Kathyrn Sheriff, RN, MSN, [facility] Chronic Care Management Coordinator [facility] [phone]

## 2020-10-20 ENCOUNTER — Ambulatory Visit: Payer: HMO

## 2020-10-20 ENCOUNTER — Other Ambulatory Visit: Payer: Self-pay

## 2020-10-20 DIAGNOSIS — R296 Repeated falls: Secondary | ICD-10-CM | POA: Diagnosis not present

## 2020-10-20 DIAGNOSIS — R2689 Other abnormalities of gait and mobility: Secondary | ICD-10-CM | POA: Diagnosis not present

## 2020-10-20 NOTE — Therapy (Signed)
[facility] Outpatient Rehabilitation [facility] [address] [place], [place], [address] Phone: [phone]   Fax:  [phone]  Physical Therapy Treatment  Patient Details  Name: April [name] MRN: [id number] Date of Birth: [birth year] Referring Provider (PT): Dr Sigmund Hazel    Encounter Date: 10/20/2020   PT End of Session - 10/20/20 0833    Visit Number 2    Number of Visits 12    Date for PT Re-Evaluation 11/13/20    Authorization Type [employer]    PT Start Time 0745    PT Stop Time 0830    PT Time Calculation (min) 45 min    Activity Tolerance Patient tolerated treatment well    Behavior During Therapy [facility] for tasks assessed/performed           Past Medical History:  Diagnosis Date  . Allergic rhinitis   . Diabetes mellitus    TYPE 2  . Dyslipidemia   . Hyperkalemia   . Hypertension   . Insomnia    Pt stated no trouble falling or staying asleep.  . RA (rheumatoid arthritis) (HCC)     Past Surgical History:  Procedure Laterality Date  . BREAST CYST EXCISION Right 1967  . HAND TENDON SURGERY     Right  . OVARIAN CYST SURGERY Right 1988    There were no vitals filed for this visit.   Subjective Assessment - 10/20/20 0751    Subjective I did the exercise the next day and began to have pain in RT knee and lower leg.  Also pain in RT hip. Only RT foot pain this AM.    Currently in Pain? Yes    Pain Score --   mild   Pain Location Ankle    Pain Orientation Right;Lateral    Pain Type Chronic pain    Pain Onset More than a month ago    Pain Frequency Intermittent              [facility] PT Assessment - 10/20/20 0001      Assessment   Medical Diagnosis Frequent Falls     Referring Provider (PT) Dr Sigmund Hazel       Standardized Balance Assessment   Standardized Balance Assessment Berg Balance Test      Berg Balance Test   Sit to Stand Able to stand without using hands and stabilize independently    Standing  Unsupported Able to stand safely 2 minutes    Sitting with Back Unsupported but Feet Supported on Floor or Stool Able to sit safely and securely 2 minutes    Stand to Sit Sits safely with minimal use of hands    Transfers Able to transfer safely, minor use of hands    Standing Unsupported with Eyes Closed Able to stand 10 seconds safely    Standing Unsupported with Feet Together Able to place feet together independently and stand 1 minute safely    From Standing, Reach Forward with Outstretched Arm Can reach forward >12 cm safely (5")    From Standing Position, Pick up Object from Floor Able to pick up shoe safely and easily    From Standing Position, Turn to Look Behind Over each Shoulder Turn sideways only but maintains balance    Turn 360 Degrees Able to turn 360 degrees safely but slowly    Standing Unsupported, Alternately Place Feet on Step/Stool Able to stand independently and complete 8 steps >20 seconds    Standing Unsupported, One Foot in [employer]  to plae foot ahead of the other independently and hold 30 seconds    Standing on One Leg Able to lift leg independently and hold 5-10 seconds    Total Score 48    Berg comment: disscussed score with pt. and implications of using a device for decr risk of fall             Worked in bars with high stepping , side stepping and heel raises                      PT Short Term Goals - 10/02/20 0949      PT SHORT TERM GOAL #1   Title Therapy will review FOTO    Time 2    Period Weeks    Status New    Target Date 10/16/20      PT SHORT TERM GOAL #2   Title Therapy will perfrom full BERG balance test    Time 1    Period Weeks    Status New    Target Date 10/09/20      PT SHORT TERM GOAL #3   Title Patient will improve sit to stand time by 8 seconds    Time 3    Period Weeks    Status New    Target Date 10/23/20      PT SHORT TERM GOAL #4   Title Patient will increase gross bilateral LE strength to 5/5     Time 3    Period Weeks    Status New    Target Date 10/23/20             PT Long Term Goals - 10/02/20 0951      PT LONG TERM GOAL #1   Title Patient will report no falls over a 3 week period    Time 6    Period Weeks    Status New    Target Date 11/13/20      PT LONG TERM GOAL #2   Title Patient will improve BERG score by 5 points    Baseline gosal will be adjusted when BERG perfromed ( not perfroemd 2nd to time)    Time 6    Period Weeks    Status New    Target Date 11/13/20      PT LONG TERM GOAL #3   Title Patient will walk her dog without feeling off balanced or having falss.    Time 6    Period Weeks    Status New    Target Date 11/13/20                 Plan - 10/20/20 0755    Clinical Impression Statement She returns with report of increased RT leg pain post exercise. no increased pain today. She demo some decr balance on BERG.  Will see if she has incr pain from today and if not to start some home balance activity.    Comorbidities osteoperosis, RA, back pain    Examination-Activity Limitations Locomotion Level;Carry;Lift;Stairs;Squat    Examination-Participation Restrictions Community Activity;Shop    PT Treatment/Interventions ADLs/Self Care Home Management;Gait training;Stair training;Therapeutic activities;Therapeutic exercise;Functional mobility training;Balance training;DME Instruction;Neuromuscular re-education;Patient/family education;Manual techniques;Passive range of motion;Taping    PT Next Visit Plan begin balance training; progress to air-ex; work on things like hurdles to have her lft her feet; consder Tnneti eexercises for balance    Consulted and Agree with Plan of Care Patient  Patient will benefit from skilled therapeutic intervention in order to improve the following deficits and impairments:  Abnormal gait, Decreased range of motion, Decreased endurance, Decreased strength, Decreased mobility, Decreased balance  Visit  Diagnosis: Frequent falls  Other abnormalities of gait and mobility     Problem List Patient Active Problem List   Diagnosis Date Noted  . Iron deficiency anemia 01/20/2019  . Encounter for long-term (current) use of other medications 02/29/2012  . Hypertension   . Diabetes mellitus   . Hypokalemia   . RA (rheumatoid arthritis) (HCC)   . EDEMA 08/27/2010  . CARPAL TUNNEL SYNDROME, BILATERAL 10/28/2009  . NUMBNESS 10/28/2009  . Diabetes mellitus without complication (HCC) 09/20/2007  . Hyperlipidemia 09/19/2007  . ALLERGIC RHINITIS 09/19/2007  . INSOMNIA 09/19/2007    April [name]  PT 10/20/2020, 8:34 AM  [facility] [address] [place], [place], [address] Phone: [phone]   Fax:  [phone]  Name: April [name] MRN: [id number] Date of Birth: 16-Jul-1941

## 2020-10-27 ENCOUNTER — Other Ambulatory Visit: Payer: Self-pay

## 2020-10-27 ENCOUNTER — Ambulatory Visit: Payer: HMO

## 2020-10-27 DIAGNOSIS — R296 Repeated falls: Secondary | ICD-10-CM

## 2020-10-27 DIAGNOSIS — R2689 Other abnormalities of gait and mobility: Secondary | ICD-10-CM

## 2020-10-27 NOTE — Therapy (Signed)
[facility] Outpatient Rehabilitation [facility] [address] [place], [place], [address] Phone: [phone]   Fax:  [phone]  Physical Therapy Treatment  Patient Details  Name: April [name] MRN: [id number] Date of Birth: 04-21-1941 Referring Provider (PT): Dr Sigmund Hazel    Encounter Date: 10/27/2020   PT End of Session - 10/27/20 1405    Visit Number 3    Number of Visits 12    Date for PT Re-Evaluation 11/13/20    Authorization Type [employer]    PT Start Time 0205    PT Stop Time 0245    PT Time Calculation (min) 40 min    Activity Tolerance Patient tolerated treatment well    Behavior During Therapy [facility] for tasks assessed/performed           Past Medical History:  Diagnosis Date  . Allergic rhinitis   . Diabetes mellitus    TYPE 2  . Dyslipidemia   . Hyperkalemia   . Hypertension   . Insomnia    Pt stated no trouble falling or staying asleep.  . RA (rheumatoid arthritis) (HCC)     Past Surgical History:  Procedure Laterality Date  . BREAST CYST EXCISION Right 1967  . HAND TENDON SURGERY     Right  . OVARIAN CYST SURGERY Right 1988    There were no vitals filed for this visit.   Subjective Assessment - 10/27/20 1408    Subjective No falls since last visit. Did yard work last Thursday.                             [facility] Adult PT Treatment/Exercise - 10/27/20 0001      Neuro Re-ed    Neuro Re-ed Details  Stepping on and off blue foam side way and forward  back  RT and LT.  ANd heel and toe rocking . Needed PT to assist 4-5x for LOB /no fall       Knee/Hip Exercises: Aerobic   Nustep L4 5 min LE only      Knee/Hip Exercises: Standing   Heel Raises Both;15 reps    Hip Flexion Right;Left;15 reps    Hip Flexion Limitations 1/2 holding and 1/2 no support     Hip Abduction Right;Left;15 reps    Abduction Limitations REd band     Hip Extension Right;Left;Knee straight;15 reps    Extension  Limitations red band    Functional Squat 15 reps    Functional Squat Limitations at freemotion.                  PT Education - 10/27/20 1410    Education Details FOTO reviewed    Person(s) Educated Patient    Methods Explanation    Comprehension Verbalized understanding            PT Short Term Goals - 10/27/20 1513      PT SHORT TERM GOAL #1   Title Therapy will review FOTO    Status Achieved      PT SHORT TERM GOAL #2   Title Therapy will perfrom full BERG balance test    Status Achieved             PT Long Term Goals - 10/02/20 0951      PT LONG TERM GOAL #1   Title Patient will report no falls over a 3 week period    Time 6    Period Weeks  Status New    Target Date 11/13/20      PT LONG TERM GOAL #2   Title Patient will improve BERG score by 5 points    Baseline gosal will be adjusted when BERG perfromed ( not perfroemd 2nd to time)    Time 6    Period Weeks    Status New    Target Date 11/13/20      PT LONG TERM GOAL #3   Title Patient will walk her dog without feeling off balanced or having falss.    Time 6    Period Weeks    Status New    Target Date 11/13/20                 Plan - 10/27/20 1410    Clinical Impression Statement She was unsteady on foam pad . discussed need to challege balance and strengthening of hips /legs. Also talked about  possible decline that may not be impacted by balance but need to work on this over time to see impact.    PT Treatment/Interventions ADLs/Self Care Home Management;Gait training;Stair training;Therapeutic activities;Therapeutic exercise;Functional mobility training;Balance training;DME Instruction;Neuromuscular re-education;Patient/family education;Manual techniques;Passive range of motion;Taping    PT Next Visit Plan begin balance training; progress to air-ex; work on things like hurdles to have her lft her feet; consder Tnneti eexercises for balance    PT Home Exercise Plan laq, hamstring  curl calmshell all given for genral strengthening.   Stand DF/hip ext and abduction, mini squats    Consulted and Agree with Plan of Care Patient           Patient will benefit from skilled therapeutic intervention in order to improve the following deficits and impairments:  Abnormal gait, Decreased range of motion, Decreased endurance, Decreased strength, Decreased mobility, Decreased balance  Visit Diagnosis: Frequent falls  Other abnormalities of gait and mobility     Problem List Patient Active Problem List   Diagnosis Date Noted  . Iron deficiency anemia 01/20/2019  . Encounter for long-term (current) use of other medications 02/29/2012  . Hypertension   . Diabetes mellitus   . Hypokalemia   . RA (rheumatoid arthritis) (HCC)   . EDEMA 08/27/2010  . CARPAL TUNNEL SYNDROME, BILATERAL 10/28/2009  . NUMBNESS 10/28/2009  . Diabetes mellitus without complication (HCC) 09/20/2007  . Hyperlipidemia 09/19/2007  . ALLERGIC RHINITIS 09/19/2007  . INSOMNIA 09/19/2007    Caprice Red  PT 10/27/2020, 3:15 PM  [facility] [address] [place], [place], [address] Phone: [phone]   Fax:  [phone]  Name: April [name] MRN: [id number] Date of Birth: Apr 30, 1941

## 2020-11-02 ENCOUNTER — Other Ambulatory Visit: Payer: Self-pay

## 2020-11-02 ENCOUNTER — Other Ambulatory Visit (INDEPENDENT_AMBULATORY_CARE_PROVIDER_SITE_OTHER): Payer: HMO

## 2020-11-02 DIAGNOSIS — E782 Mixed hyperlipidemia: Secondary | ICD-10-CM

## 2020-11-02 DIAGNOSIS — E119 Type 2 diabetes mellitus without complications: Secondary | ICD-10-CM | POA: Diagnosis not present

## 2020-11-02 LAB — LIPID PANEL
Cholesterol: 193 mg/dL (ref 0–200)
HDL: 80.4 mg/dL (ref >39.00)
LDL Cholesterol: 103 mg/dL — ABNORMAL HIGH (ref 0–99)
NonHDL: 112.65
Total CHOL/HDL Ratio: 2
Triglycerides: 47 mg/dL (ref 0.0–149.0)
VLDL: 9.4 mg/dL (ref 0.0–40.0)

## 2020-11-02 LAB — COMPREHENSIVE METABOLIC PANEL
ALT: 20 U/L (ref 0–35)
AST: 21 U/L (ref 0–37)
Albumin: 4.1 g/dL (ref 3.5–5.2)
Alkaline Phosphatase: 77 U/L (ref 39–117)
BUN: 19 mg/dL (ref 6–23)
CO2: 26 mEq/L (ref 19–32)
Calcium: 9.4 mg/dL (ref 8.4–10.5)
Chloride: 103 mEq/L (ref 96–112)
Creatinine, Ser: 1.2 mg/dL (ref 0.40–1.20)
GFR: 43.69 mL/min — ABNORMAL LOW (ref >60.00)
Glucose, Bld: 80 mg/dL (ref 70–99)
Potassium: 3.8 mEq/L (ref 3.5–5.1)
Sodium: 138 mEq/L (ref 135–145)
Total Bilirubin: 0.5 mg/dL (ref 0.2–1.2)
Total Protein: 7.1 g/dL (ref 6.0–8.3)

## 2020-11-02 LAB — HEMOGLOBIN A1C: Hgb A1c MFr Bld: 5.8 % (ref 4.6–6.5)

## 2020-11-03 ENCOUNTER — Ambulatory Visit: Payer: HMO

## 2020-11-03 DIAGNOSIS — R2689 Other abnormalities of gait and mobility: Secondary | ICD-10-CM

## 2020-11-03 DIAGNOSIS — R296 Repeated falls: Secondary | ICD-10-CM

## 2020-11-03 NOTE — Therapy (Signed)
[facility] [address] [place], [place], [address] Phone: [phone]   Fax:  [phone]  Physical Therapy Treatment  Patient Details  Name: April [name] MRN: [id number] Date of Birth: 08-Jul-1941 Referring Provider (PT): Dr Sigmund Hazel    Encounter Date: 11/03/2020   PT End of Session - 11/03/20 0710    Visit Number 4    Number of Visits 12    Date for PT Re-Evaluation 11/13/20    Authorization Type Health [employer]    PT Start Time 0700    PT Stop Time 0740    PT Time Calculation (min) 40 min    Activity Tolerance Patient tolerated treatment well    Behavior During Therapy [facility] for tasks assessed/performed           Past Medical History:  Diagnosis Date  . Allergic rhinitis   . Diabetes mellitus    TYPE 2  . Dyslipidemia   . Hyperkalemia   . Hypertension   . Insomnia    Pt stated no trouble falling or staying asleep.  . RA (rheumatoid arthritis) (HCC)     Past Surgical History:  Procedure Laterality Date  . BREAST CYST EXCISION Right 1967  . HAND TENDON SURGERY     Right  . OVARIAN CYST SURGERY Right 1988    There were no vitals filed for this visit.   Subjective Assessment - 11/03/20 0709    Subjective No falls. Both arms sore from flu and booster shot.                             [facility] Adult PT Treatment/Exercise - 11/03/20 0001      Neuro Re-ed    Neuro Re-ed Details  stepping tandem with light UE support and with wide step LT andRt with no UE support an don single elg with light UE support.       Knee/Hip Exercises: Aerobic   Nustep L4 5 min LE only      Knee/Hip Exercises: Standing   Forward Step Up Right;Left;Hand Hold: 2;Step Height: 6";15 reps    Other Standing Knee Exercises stepping over hurdles in bars , side steps  with yellow band , backward steps with yellow  bandx 4 trips in bars.         Knee/Hip Exercises: Seated   Sit to Sand 5 reps;2 sets   23  sec                   PT Short Term Goals - 11/03/20 0731      PT SHORT TERM GOAL #3   Title Patient will improve sit to stand time by 8 seconds    Baseline 20 sec    Status On-going             PT Long Term Goals - 10/02/20 0951      PT LONG TERM GOAL #1   Title Patient will report no falls over a 3 week period    Time 6    Period Weeks    Status New    Target Date 11/13/20      PT LONG TERM GOAL #2   Title Patient will improve BERG score by 5 points    Baseline gosal will be adjusted when BERG perfromed ( not perfroemd 2nd to time)    Time 6    Period Weeks    Status New  Target Date 11/13/20      PT LONG TERM GOAL #3   Title Patient will walk her dog without feeling off balanced or having falss.    Time 6    Period Weeks    Status New    Target Date 11/13/20                 Plan - 11/03/20 0754    Clinical Impression Statement No change in sit to stand . She is coming 1x/week so progress may be slow. She continues to have some hesitation upon standing from chair.  she is unsteady with narrow BOS. Will keep working on this.    PT Treatment/Interventions ADLs/Self Care Home Management;Gait training;Stair training;Therapeutic activities;Therapeutic exercise;Functional mobility training;Balance training;DME Instruction;Neuromuscular re-education;Patient/family education;Manual techniques;Passive range of motion;Taping    PT Next Visit Plan balance training;  air-ex; work on things like hurdles to have her lft her feet;    PT Home Exercise Plan laq, hamstring curl calmshell all given for genral strengthening.   Stand DF/hip ext and abduction, mini squats    Consulted and Agree with Plan of Care Patient           Patient will benefit from skilled therapeutic intervention in order to improve the following deficits and impairments:  Abnormal gait, Decreased range of motion, Decreased endurance, Decreased strength, Decreased mobility, Decreased  balance  Visit Diagnosis: Frequent falls  Other abnormalities of gait and mobility     Problem List Patient Active Problem List   Diagnosis Date Noted  . Iron deficiency anemia 01/20/2019  . Encounter for long-term (current) use of other medications 02/29/2012  . Hypertension   . Diabetes mellitus   . Hypokalemia   . RA (rheumatoid arthritis) (HCC)   . EDEMA 08/27/2010  . CARPAL TUNNEL SYNDROME, BILATERAL 10/28/2009  . NUMBNESS 10/28/2009  . Diabetes mellitus without complication (HCC) 09/20/2007  . Hyperlipidemia 09/19/2007  . ALLERGIC RHINITIS 09/19/2007  . INSOMNIA 09/19/2007    Caprice Red  PT 11/03/2020, 7:55 AM  [facility] [address] [place], [place], [address] Phone: [phone]   Fax:  [phone]  Name: April [name] MRN: [id number] Date of Birth: 1941-02-14

## 2020-11-05 ENCOUNTER — Other Ambulatory Visit: Payer: Self-pay

## 2020-11-05 ENCOUNTER — Ambulatory Visit (INDEPENDENT_AMBULATORY_CARE_PROVIDER_SITE_OTHER): Payer: HMO | Admitting: Endocrinology

## 2020-11-05 ENCOUNTER — Encounter: Payer: Self-pay | Admitting: Endocrinology

## 2020-11-05 VITALS — BP 122/70 | HR 75 | Ht 64.0 in | Wt 156.6 lb

## 2020-11-05 DIAGNOSIS — E7800 Pure hypercholesterolemia, unspecified: Secondary | ICD-10-CM

## 2020-11-05 DIAGNOSIS — E876 Hypokalemia: Secondary | ICD-10-CM | POA: Diagnosis not present

## 2020-11-05 DIAGNOSIS — E119 Type 2 diabetes mellitus without complications: Secondary | ICD-10-CM

## 2020-11-05 DIAGNOSIS — I10 Essential (primary) hypertension: Secondary | ICD-10-CM | POA: Diagnosis not present

## 2020-11-05 NOTE — Progress Notes (Signed)
Patient ID: April [name], female   DOB: January 27, 1941, 79 y.o.   MRN: [id number]   Reason for Appointment: follow-up of various problems  History of Present Illness    HYPERTENSION:  diagnosed around 1979 with symptoms of headaches Previously she was taking Avapro, clonidine 0.6 at bedtime and Dyazide but blood pressure was relatively high with this regimen  For evaluation of her hyperaldosteronism she was switched to labetalol 100 mg twice a day, Tenex 2 mg and doxazosin With this regimen her blood pressure has been much better She was subsequently started on Aldactone to help with her severe hypokalemia and doxazosin was stopped  She is taking Aldactone 100 mg, labetalol 100 mg twice a day with 12.5 mg hydrochlorothiazide   She has been checking her blood pressure at home, this is stable in the 120s/70s range as before   BP Readings from Last 3 Encounters:  11/05/20 122/70  10/12/20 136/78  07/02/20 128/72   RENAL function as follows:   Lab Results  Component Value Date   CREATININE 1.20 11/02/2020   CREATININE 1.04 06/29/2020   CREATININE 1.15 02/24/2020      HYPOKALEMIA:   This previously had been a significant longstanding problem and associated with hypertension  Previously had  been prescribed 6 tablets of potassium daily She was off her diuretics and Avapro when her evaluation for hyperaldosteronism was done, aldosterone level was 6.0 along with a relatively low renin level  She has a normal potassium level usually with Aldactone 100 mg daily along with 1 tablet of potassium 20 mEq daily Potassium is better with taking the full tablet of the supplements  Lab Results  Component Value Date   K 3.8 11/02/2020     DIABETES type II:   She has had diabetes since at least 2015 This has been  well controlled with a regimen of metformin 1 g twice a day   A1c has been consistently upper normal, now 5.8 compared to 5.9  She is using an  Accu-Chek meter  She usually checks blood sugars mostly in the mornings and by history her recent blood sugars are in the 90+ range Did not bring her monitor today  Previous range: 99-116, after lunch 142 and after dinner 100 Lab glucose was 80  She takes Metformin 1 g twice a day regularly with meals and has been doing this long-term She has been trying to do fairly well with her activity and going to the mall to walk  She feels that her diet is consistently good and has eliminated soft drinks with sugar Weight is stable   Weight history:   Wt Readings from Last 3 Encounters:  11/05/20 156 lb 9.6 oz (71 kg)  10/12/20 156 lb (70.8 kg)  07/02/20 160 lb 9.6 oz (72.8 kg)      Lab Results  Component Value Date   HGBA1C 5.8 11/02/2020   HGBA1C 5.9 06/29/2020   HGBA1C 5.7 02/24/2020   Lab Results  Component Value Date   MICROALBUR 1.9 02/24/2020   LDLCALC 103 (H) 11/02/2020   CREATININE 1.20 11/02/2020    OTHER active problems discussed today are in review of systems  LABS:  Lab on 11/02/2020  Component Date Value Ref Range Status  . Cholesterol 11/02/2020 193  0 - 200 mg/dL Final   ATP III Classification       Desirable:  < 200 mg/dL               Borderline  High:  200 - 239 mg/dL          High:  > = 595 mg/dL  . Triglycerides 11/02/2020 47.0  0 - 149 mg/dL Final   Normal:  <638 mg/dLBorderline High:  150 - 199 mg/dL  . HDL 11/02/2020 80.40  >39.00 mg/dL Final  . VLDL 75/64/3329 9.4  0.0 - 51.8 mg/dL Final  . LDL Cholesterol 11/02/2020 103* 0 - 99 mg/dL Final  . Total CHOL/HDL Ratio 11/02/2020 2   Final                  Men          Women1/2 Average Risk     3.4          3.3Average Risk          5.0          4.42X Average Risk          9.6          7.13X Average Risk          15.0          11.0                      . NonHDL 11/02/2020 112.65   Final   NOTE:  Non-HDL goal should be 30 mg/dL higher than patient's LDL goal (i.e. LDL goal of < 70 mg/dL, would have non-HDL  goal of < 100 mg/dL)  . Sodium 11/02/2020 138  135 - 145 mEq/L Final  . Potassium 11/02/2020 3.8  3.5 - 5.1 mEq/L Final  . Chloride 11/02/2020 103  96 - 112 mEq/L Final  . CO2 11/02/2020 26  19 - 32 mEq/L Final  . Glucose, Bld 11/02/2020 80  70 - 99 mg/dL Final  . BUN 84/16/6063 19  6 - 23 mg/dL Final  . Creatinine, Ser 11/02/2020 1.20  0.40 - 1.20 mg/dL Final  . Total Bilirubin 11/02/2020 0.5  0.2 - 1.2 mg/dL Final  . Alkaline Phosphatase 11/02/2020 77  39 - 117 U/L Final  . AST 11/02/2020 21  0 - 37 U/L Final  . ALT 11/02/2020 20  0 - 35 U/L Final  . Total Protein 11/02/2020 7.1  6.0 - 8.3 g/dL Final  . Albumin 01/60/1093 4.1  3.5 - 5.2 g/dL Final  . GFR 23/55/7322 43.69* >60.00 mL/min Final   Calculated using the CKD-EPI Creatinine Equation (2021)  . Calcium 11/02/2020 9.4  8.4 - 10.5 mg/dL Final  . Hgb G2R MFr Bld 11/02/2020 5.8  4.6 - 6.5 % Final   Glycemic Control Guidelines for People with Diabetes:Non Diabetic:  <6%Goal of Therapy: <7%Additional Action Suggested:  >8%     Allergies as of 11/05/2020      Reactions   Acetaminophen Nausea Only   Actos [pioglitazone Hydrochloride]    edema   Aspirin Nausea Only   Atorvastatin Other (See Comments)   Felt drunk/high floating   Morphine Sulfate Other (See Comments)   Burns injecting "arm is on fire"   Naproxen Sodium Nausea Only   Sitagliptin Phosphate Nausea And Vomiting      Medication List       Accurate as of November 05, 2020  8:39 AM. If you have any questions, ask your nurse or doctor.        Accu-Chek Compact Plus test strip Generic drug: glucose blood Use to check blood sugar once a day dx code E11.9   Accu-Chek Softclix Lancets lancets  Use to check blood sugar once a day dx code E11.9   amitriptyline 25 MG tablet Commonly known as: ELAVIL Take 25 mg by mouth at bedtime.   Co Q 10 100 MG Caps Take by mouth.   ezetimibe 10 MG tablet Commonly known as: ZETIA Take 10 mg by mouth daily.   folic acid 0.5  MG tablet Commonly known as: FOLVITE Take 0.5 mg by mouth daily.   gabapentin 300 MG capsule Commonly known as: NEURONTIN Take 300 mg by mouth daily.   Ginger 500 MG Caps Take 1 capsule by mouth daily.   hydrochlorothiazide 12.5 MG capsule Commonly known as: MICROZIDE Take 1 capsule by mouth once daily   labetalol 100 MG tablet Commonly known as: NORMODYNE TAKE 1 TABLET BY MOUTH TWICE A DAY   metFORMIN 1000 MG tablet Commonly known as: GLUCOPHAGE TAKE 1 TABLET BY MOUTH TWICE A DAY   methotrexate 2.5 MG tablet Commonly known as: RHEUMATREX Take 15 mg by mouth once a week. Patient takes 3 tablets twice once a week to add up to be 6 tablets once weekly, starts with 2 tablets in the evening and takes 2 more tablets in the morning   montelukast 10 MG tablet Commonly known as: SINGULAIR Take 10 mg by mouth daily.   Nucynta 50 MG tablet Generic drug: tapentadol Take 50 mg by mouth as needed for severe pain (spasms).   potassium chloride SA 20 MEQ tablet Commonly known as: Klor-Con M20 Take 1/2 tablet ( total) by mouth once daily.   pravastatin 80 MG tablet Commonly known as: PRAVACHOL TAKE 1 TABLET BY MOUTH DAILY   spironolactone 100 MG tablet Commonly known as: ALDACTONE TAKE 1 TABLET BY MOUTH EVERY DAY   Turmeric 450 MG Caps Take 1 capsule by mouth daily.   vitamin B-12 1000 MCG tablet Commonly known as: CYANOCOBALAMIN Take 1,000 mcg by mouth daily.       Allergies:  Allergies  Allergen Reactions  . Acetaminophen Nausea Only  . Actos [Pioglitazone Hydrochloride]     edema  . Aspirin Nausea Only  . Atorvastatin Other (See Comments)    Felt drunk/high floating  . Morphine Sulfate Other (See Comments)    Burns injecting "arm is on fire"  . Naproxen Sodium Nausea Only  . Sitagliptin Phosphate Nausea And Vomiting    Past Medical History:  Diagnosis Date  . Allergic rhinitis   . Diabetes mellitus    TYPE 2  . Dyslipidemia   . Hyperkalemia   .  Hypertension   . Insomnia    Pt stated no trouble falling or staying asleep.  . RA (rheumatoid arthritis) (HCC)     Past Surgical History:  Procedure Laterality Date  . BREAST CYST EXCISION Right 1967  . HAND TENDON SURGERY     Right  . OVARIAN CYST SURGERY Right 1988    Family History  Problem Relation Age of Onset  . Diabetes Mother   . Diabetes Father   . Cancer Father        Prostate  . Diabetes Sister   . Stroke Maternal Grandmother        Cause of death  . Healthy Brother     Social History:  reports that she has never smoked. She has never used smokeless tobacco. She reports that she does not drink alcohol and does not use drugs.  Review of Systems:   History of rheumatoid arthritis, on  Methotrexate, followed by rheumatologist Also takes gabapentin for pain  Vitamin  D deficiency: She is taking vitamin D3, 1000 U daily  LIPIDS: Taking Zetia from PCP and is also on pravastatin 80 mg LDL was 96 and now 103 She is quite sure that she is not missing any doses of pravastatin and Zetia and recently had Zetia picked up about a month ago  She does think that she can do a little better with diet with certain meats, fried food and dairy  Lab Results  Component Value Date   CHOL 193 11/02/2020   CHOL 180 02/24/2020   CHOL 149 06/25/2018   Lab Results  Component Value Date   HDL 80.40 11/02/2020   HDL 71.20 02/24/2020   HDL 66.60 06/25/2018   Lab Results  Component Value Date   LDLCALC 103 (H) 11/02/2020   LDLCALC 96 02/24/2020   LDLCALC 73 06/25/2018   Lab Results  Component Value Date   TRIG 47.0 11/02/2020   TRIG 65.0 02/24/2020   TRIG 45.0 06/25/2018   Lab Results  Component Value Date   CHOLHDL 2 11/02/2020   CHOLHDL 3 02/24/2020   CHOLHDL 2 06/25/2018   Lab Results  Component Value Date   LDLDIRECT 85.0 10/09/2019    Lab Results  Component Value Date   ALT 20 11/02/2020   ALT 24 01/15/2020   ALT 23 09/11/2017      Examination:    BP 122/70   Pulse 75   Ht  (1.626 m)   Wt 156 lb 9.6 oz (71 kg)   SpO2 97%   BMI 26.88 kg/m   Body mass index is 26.88 kg/m.       Assesment/PLAN:  Hypertension:  Blood pressure is consistently well controlled Also blood pressure has been good when checked at home  She is on a 3 drug regimen of  Aldactone 100 mg, labetalol and  HCTZ 12.5 mg She will continue the same doses of all 3 medications   Hypokalemia: She is still requiring some potassium supplementation despite using Aldactone 100 mg She will continue 1 tablet of 20 mEq as before  DIABETES: Well controlled with Metformin 1 g twice daily A1c is 5.8, previously fructosamine 235 She reports fairly good readings in the normal range and will continue the same regimen Again reminded her to continue regular walking for exercise  LIPIDS: Likely why her LDL is higher, she thinks she is taking her medications regularly including pravastatin and Zetia Given her list of high saturated fat foods to avoid and will recheck her labs in the next visit If LDL is still consistently high may consider switching to Crestor   There are no Patient Instructions on file for this visit.   Reather Littler 11/05/2020, 8:39 AM

## 2020-11-10 ENCOUNTER — Ambulatory Visit: Payer: HMO

## 2020-11-17 ENCOUNTER — Ambulatory Visit: Payer: HMO | Attending: Family Medicine

## 2020-11-17 ENCOUNTER — Other Ambulatory Visit: Payer: Self-pay | Admitting: Cardiovascular Disease

## 2020-11-17 ENCOUNTER — Other Ambulatory Visit: Payer: Self-pay

## 2020-11-17 DIAGNOSIS — R296 Repeated falls: Secondary | ICD-10-CM | POA: Diagnosis not present

## 2020-11-17 DIAGNOSIS — R2689 Other abnormalities of gait and mobility: Secondary | ICD-10-CM | POA: Insufficient documentation

## 2020-11-17 NOTE — Therapy (Signed)
[place], [place], [address] Phone: [phone]   Fax:  [phone]  Physical Therapy Treatment  Patient Details  Name: April [name] MRN: [id number] Date of Birth: June 07, 1941 Referring Provider (PT): Dr Kathyrn Lass    Encounter Date: 11/17/2020   PT End of Session - 11/17/20 0703    Visit Number 5    Number of Visits 12    Date for PT Re-Evaluation 12/11/20    Authorization Type [employer]    PT Start Time 0702    PT Stop Time 0742    PT Time Calculation (min) 40 min    Activity Tolerance Patient tolerated treatment well    Behavior During Therapy [facility] for tasks assessed/performed           Past Medical History:  Diagnosis Date  . Allergic rhinitis   . Diabetes mellitus    TYPE 2  . Dyslipidemia   . Hyperkalemia   . Hypertension   . Insomnia    Pt stated no trouble falling or staying asleep.  . RA (rheumatoid arthritis) ([place])     Past Surgical History:  Procedure Laterality Date  . BREAST CYST EXCISION Right 1967  . HAND TENDON SURGERY     Right  . OVARIAN CYST SURGERY Right 1988    There were no vitals filed for this visit.   Subjective Assessment - 11/17/20 0710    Subjective She reports not doing well. Her legs were in pain.  This happened last week and Saturday it happened again. She also had problems in hands this weekend.   used heat and icy hot ointment.  Some swelling in RT leg.  Wil see ortho MD  this month.  She reports RT leg freezing up with driving.    Currently in Pain? No/denies              [facility] PT Assessment - 11/17/20 0001      Assessment   Medical Diagnosis Frequent Falls     Referring Provider (PT) Dr Renie Ora Balance Test   Sit to Stand Able to stand without using hands and stabilize independently    Standing Unsupported Able to stand safely 2 minutes    Sitting with Back Unsupported but Feet Supported on Floor or  Stool Able to sit safely and securely 2 minutes    Stand to Sit Sits safely with minimal use of hands    Transfers Able to transfer safely, minor use of hands    Standing Unsupported with Eyes Closed Able to stand 10 seconds safely    Standing Unsupported with Feet Together Able to place feet together independently and stand 1 minute safely    From Standing, Reach Forward with Outstretched Arm Can reach forward >12 cm safely (5")    From Standing Position, Pick up Object from Floor Able to pick up shoe safely and easily    From Standing Position, Turn to Look Behind Over each Shoulder Looks behind from both sides and weight shifts well    Turn 360 Degrees Able to turn 360 degrees safely but slowly    Standing Unsupported, Alternately Place Feet on Step/Stool Able to stand independently and safely and complete 8 steps in 20 seconds    Standing Unsupported, One Foot in Front Able to plae foot ahead of the other independently and hold 30 seconds    Standing on One Leg Able to lift leg independently  and hold equal to or more than 3 seconds    Total Score 50    Berg comment: score discussed wit pt improved 2 points                         [facility] Adult PT Treatment/Exercise - 11/17/20 0001      Knee/Hip Exercises: Aerobic   Nustep L4 5 min LE only                    PT Short Term Goals - 11/17/20 0724      PT SHORT TERM GOAL #3   Title Patient will improve sit to stand time by 8 seconds    Baseline 23 sec    Status Not Met      PT SHORT TERM GOAL #4   Baseline [place] PF 4-/5 bilateral,  Hip extension,  4/5 bilaterally,  abduction RT 4/5 LT 4+/5  (pain in feet with PF)             PT Long Term Goals - 11/17/20 0739      PT LONG TERM GOAL #1   Title Patient will report no falls over a 3 week period    Status Achieved      PT LONG TERM GOAL #2   Title Patient will improve BERG score by 5 points    Baseline 2 pointts from 48 to 50    Status Partially Met       PT LONG TERM GOAL #3   Title Patient will walk her dog without feeling off balanced or having falls.    Baseline she reports no problem    Status Achieved                 Plan - 11/17/20 0723    Clinical Impression Statement Worse on FOTO today.  BERG score was better. She is not doing her HEP . She has moved and is moving items. She reports more incidents of pain and discomfort but this may be related to moving boxes  with the move. I don't think we will have a major impact onher pain and function but she needs to reestablish HEP so will seeher 3 more sessions to reestablish and add to current HEP.    PT Treatment/Interventions ADLs/Self Care Home Management;Gait training;Stair training;Therapeutic activities;Therapeutic exercise;Functional mobility training;Balance training;DME Instruction;Neuromuscular re-education;Patient/family education;Manual techniques;Passive range of motion;Taping    PT Next Visit Plan REview HEp and add to same.    PT Home Exercise Plan laq, hamstring curl calmshell all given for genral strengthening.   Stand DF/hip ext and abduction, mini squats    Consulted and Agree with Plan of Care Patient           Patient will benefit from skilled therapeutic intervention in order to improve the following deficits and impairments:  Abnormal gait,Decreased range of motion,Decreased endurance,Decreased strength,Decreased mobility,Decreased balance  Visit Diagnosis: Frequent falls  Other abnormalities of gait and mobility     Problem List Patient Active Problem List   Diagnosis Date Noted  . Iron deficiency anemia 01/20/2019  . Encounter for long-term (current) use of other medications 02/29/2012  . Hypertension   . Diabetes mellitus   . Hypokalemia   . RA (rheumatoid arthritis) ([place])   . EDEMA 08/27/2010  . CARPAL TUNNEL SYNDROME, BILATERAL 10/28/2009  . NUMBNESS 10/28/2009  . Diabetes mellitus without complication ([place]) 28/76/8115  .  Hyperlipidemia 09/19/2007  . ALLERGIC RHINITIS 09/19/2007  .  INSOMNIA 09/19/2007    Darrel Hoover  PT 11/17/2020, 8:23 AM  [facility] [address] [place], [place], [address] Phone: [phone]   Fax:  [phone]  Name: April [name] MRN: [id number] Date of Birth: 08-Jan-1941

## 2020-11-24 ENCOUNTER — Other Ambulatory Visit: Payer: Self-pay

## 2020-11-24 ENCOUNTER — Ambulatory Visit: Payer: HMO

## 2020-11-24 DIAGNOSIS — R296 Repeated falls: Secondary | ICD-10-CM | POA: Diagnosis not present

## 2020-11-24 DIAGNOSIS — R2689 Other abnormalities of gait and mobility: Secondary | ICD-10-CM

## 2020-11-24 NOTE — Therapy (Signed)
[name], [place], [address] Phone: [phone]   Fax:  [phone]  Physical Therapy Treatment  Patient Details  Name: April [name] MRN: [id number] Date of Birth: 11/29/1941 Referring Provider (PT): Dr Kathyrn Lass    Encounter Date: 11/24/2020   PT End of Session - 11/24/20 0719    Visit Number 6    Number of Visits 12    Date for PT Re-Evaluation 12/11/20    Authorization Type [employer]    PT Start Time 0702    PT Stop Time 0742    PT Time Calculation (min) 40 min    Activity Tolerance Patient tolerated treatment well    Behavior During Therapy [facility] for tasks assessed/performed           Past Medical History:  Diagnosis Date  . Allergic rhinitis   . Diabetes mellitus    TYPE 2  . Dyslipidemia   . Hyperkalemia   . Hypertension   . Insomnia    Pt stated no trouble falling or staying asleep.  . RA (rheumatoid arthritis) ([place])     Past Surgical History:  Procedure Laterality Date  . BREAST CYST EXCISION Right 1967  . HAND TENDON SURGERY     Right  . OVARIAN CYST SURGERY Right 1988    There were no vitals filed for this visit.   Subjective Assessment - 11/24/20 0718    Subjective Doing well today  min to no leg pain                             [facility] Adult PT Treatment/Exercise - 11/24/20 0001      Knee/Hip Exercises: Aerobic   Nustep L4 8 min LE only      Knee/Hip Exercises: Standing   Heel Raises Both;15 reps    Hip Flexion Right;Left;15 reps    Hip Flexion Limitations 4#    Hip Abduction Right;Left;15 reps    Abduction Limitations 4#    Hip Extension Right;Left;15 reps    Extension Limitations 4#      Knee/Hip Exercises: Seated   Long Arc Quad Right;Left;20 reps    Long Arc Quad Weight 4 lbs.    Clamshell with TheraBand Green   x20   Sit to Sand 5 reps;2 sets      Knee/Hip Exercises: Supine   Bridges 20 reps    Straight Leg  Raises Right;Left;15 reps      Knee/Hip Exercises: Sidelying   Hip ABduction Right;Left    Hip ABduction Limitations 12 reps green band                    PT Short Term Goals - 11/17/20 0724      PT SHORT TERM GOAL #3   Title Patient will improve sit to stand time by 8 seconds    Baseline 23 sec    Status Not Met      PT SHORT TERM GOAL #4   Baseline [place] PF 4-/5 bilateral,  Hip extension,  4/5 bilaterally,  abduction RT 4/5 LT 4+/5  (pain in feet with PF)             PT Long Term Goals - 11/17/20 0739      PT LONG TERM GOAL #1   Title Patient will report no falls over a 3 week period    Status Achieved      PT LONG  TERM GOAL #2   Title Patient will improve BERG score by 5 points    Baseline 2 pointts from 48 to 50    Status Partially Met      PT LONG TERM GOAL #3   Title Patient will walk her dog without feeling off balanced or having falls.    Baseline she reports no problem    Status Achieved                 Plan - 11/24/20 0720    Clinical Impression Statement She did well and able to do her HEP when initiated.  . Add weight to exercises.  Balance activity next visit.    PT Treatment/Interventions ADLs/Self Care Home Management;Gait training;Stair training;Therapeutic activities;Therapeutic exercise;Functional mobility training;Balance training;DME Instruction;Neuromuscular re-education;Patient/family education;Manual techniques;Passive range of motion;Taping    PT Next Visit Plan REview HEp and add to same.    PT Home Exercise Plan laq, hamstring curl calmshell all given for genral strengthening.   Stand DF/hip ext and abduction, mini squats    Consulted and Agree with Plan of Care Patient           Patient will benefit from skilled therapeutic intervention in order to improve the following deficits and impairments:  Abnormal gait,Decreased range of motion,Decreased endurance,Decreased strength,Decreased mobility,Decreased balance  Visit  Diagnosis: Other abnormalities of gait and mobility  Frequent falls     Problem List Patient Active Problem List   Diagnosis Date Noted  . Iron deficiency anemia 01/20/2019  . Encounter for long-term (current) use of other medications 02/29/2012  . Hypertension   . Diabetes mellitus   . Hypokalemia   . RA (rheumatoid arthritis) ([place])   . EDEMA 08/27/2010  . CARPAL TUNNEL SYNDROME, BILATERAL 10/28/2009  . NUMBNESS 10/28/2009  . Diabetes mellitus without complication ([place]) 99/35/7017  . Hyperlipidemia 09/19/2007  . ALLERGIC RHINITIS 09/19/2007  . INSOMNIA 09/19/2007    Darrel Hoover  PT 11/24/2020, 7:49 AM  [facility] [address] [place], [place], [address] Phone: [phone]   Fax:  [phone]  Name: April [name] MRN: [id number] Date of Birth: 1941/04/10

## 2020-11-25 DIAGNOSIS — G894 Chronic pain syndrome: Secondary | ICD-10-CM | POA: Diagnosis not present

## 2020-11-25 DIAGNOSIS — Z79899 Other long term (current) drug therapy: Secondary | ICD-10-CM | POA: Diagnosis not present

## 2020-11-30 ENCOUNTER — Ambulatory Visit: Payer: HMO

## 2020-12-01 ENCOUNTER — Other Ambulatory Visit: Payer: Self-pay

## 2020-12-01 ENCOUNTER — Encounter: Payer: Self-pay | Admitting: Physical Therapy

## 2020-12-01 ENCOUNTER — Ambulatory Visit: Payer: HMO | Admitting: Physical Therapy

## 2020-12-01 DIAGNOSIS — R2689 Other abnormalities of gait and mobility: Secondary | ICD-10-CM

## 2020-12-01 DIAGNOSIS — R296 Repeated falls: Secondary | ICD-10-CM | POA: Diagnosis not present

## 2020-12-01 IMAGING — DX DG CHEST 2V
2 series · 2 of 2 positions shown · non-contrast
Comparison: None.

CLINICAL DATA: Fell 3 days ago, chest pain

EXAM:
CHEST - 2 VIEW

[dg chest 2 view (1 of 2)]
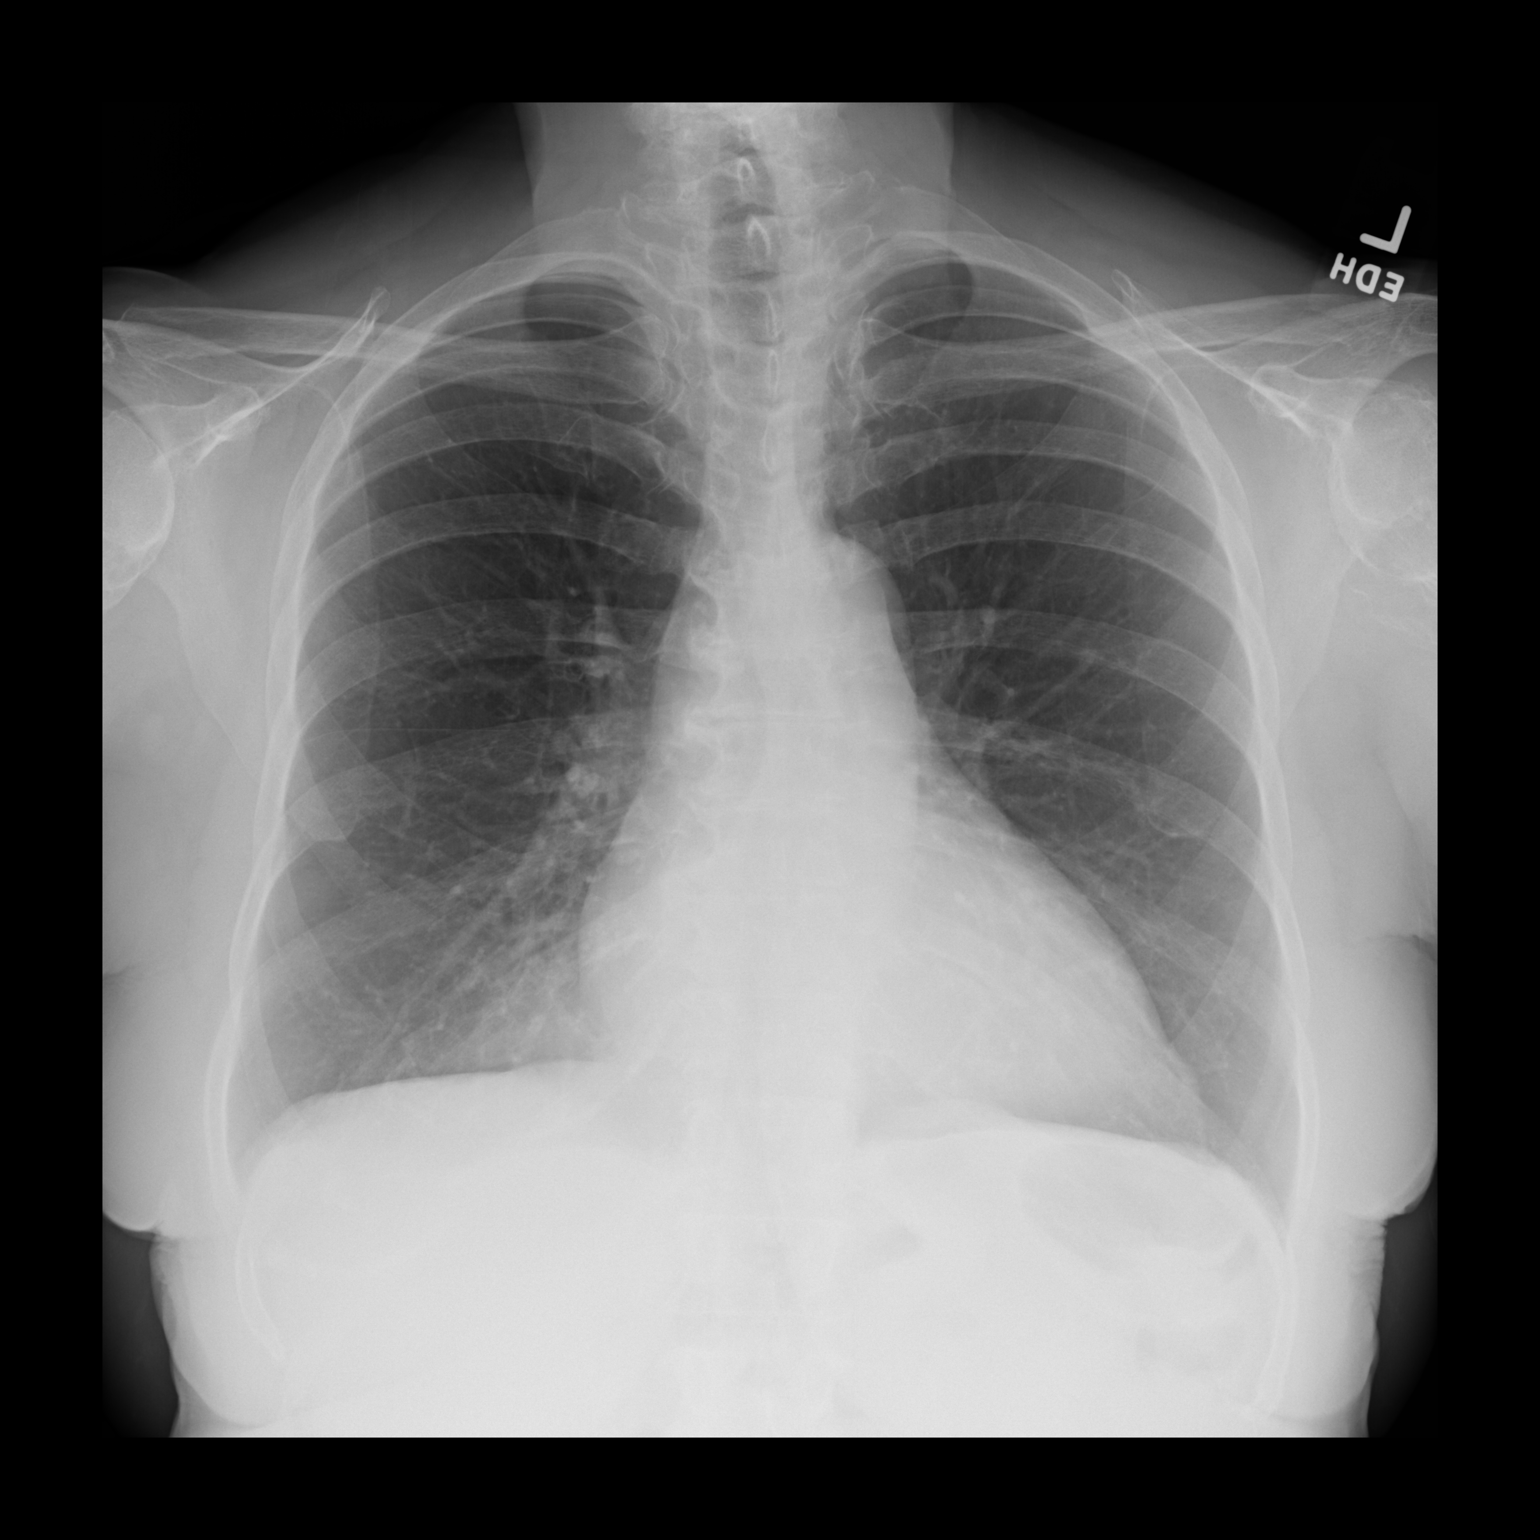

[dg chest 2 view (2 of 2)]
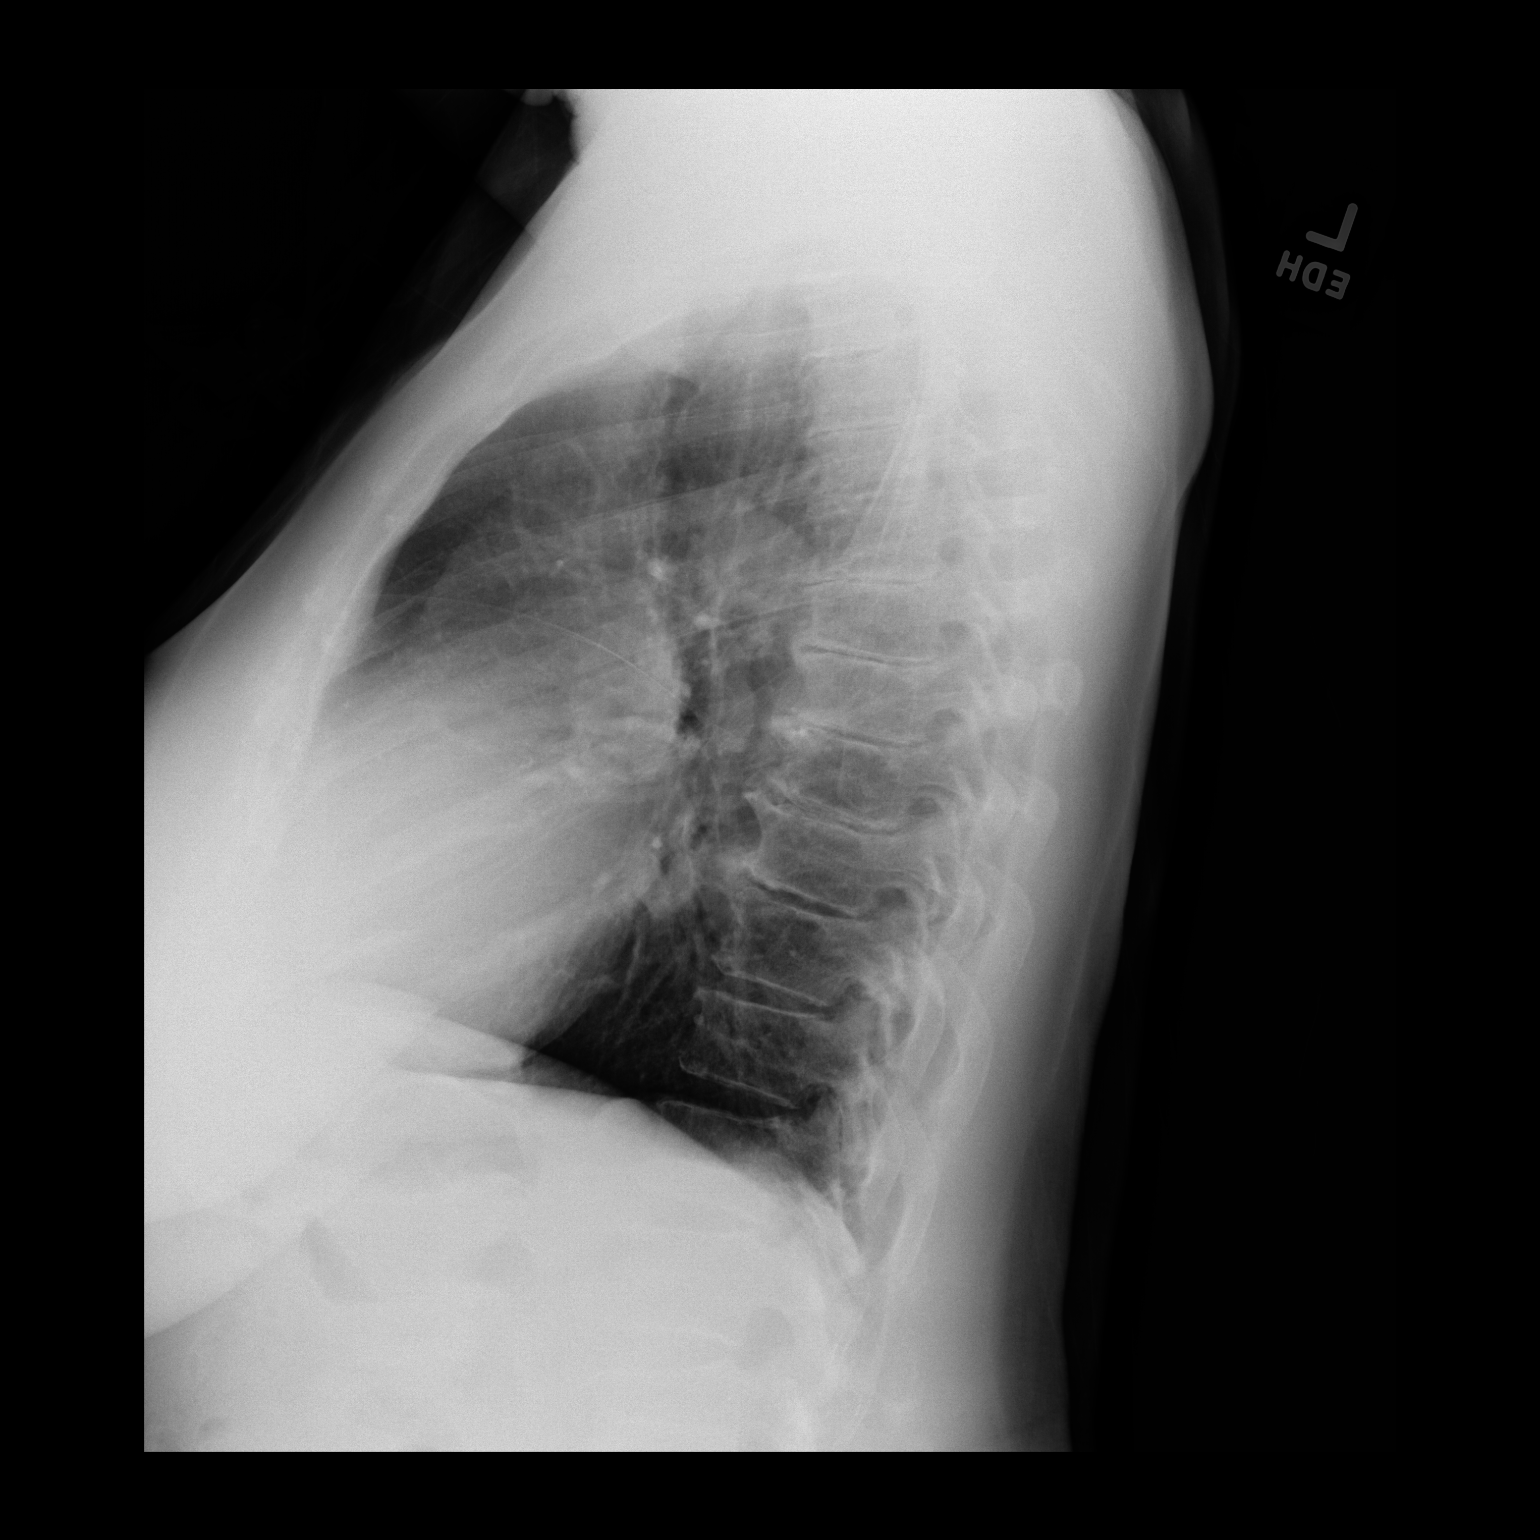

[2 of 2 positions shown; findings below may reference images not displayed]

FINDINGS: Frontal and lateral views of the chest demonstrate an unremarkable
cardiac silhouette. No airspace disease, effusion, or pneumothorax.
No acute displaced fractures.
IMPRESSION: 1. No acute intrathoracic process.

## 2020-12-01 NOTE — Therapy (Signed)
[place], [place], [address] Phone: [phone]   Fax:  [phone]  Physical Therapy Treatment  Patient Details  Name: April [name] MRN: [id number] Date of Birth: 15-Jun-1941 Referring Provider (PT): Dr Kathyrn Lass    Encounter Date: 12/01/2020   PT End of Session - 12/01/20 0718    Visit Number 7    Number of Visits 12    Date for PT Re-Evaluation 12/11/20    Authorization Type [employer]    PT Start Time 0715    PT Stop Time 0753    PT Time Calculation (min) 38 min           Past Medical History:  Diagnosis Date  . Allergic rhinitis   . Diabetes mellitus    TYPE 2  . Dyslipidemia   . Hyperkalemia   . Hypertension   . Insomnia    Pt stated no trouble falling or staying asleep.  . RA (rheumatoid arthritis) ([place])     Past Surgical History:  Procedure Laterality Date  . BREAST CYST EXCISION Right 1967  . HAND TENDON SURGERY     Right  . OVARIAN CYST SURGERY Right 1988    There were no vitals filed for this visit.   Subjective Assessment - 12/01/20 0717    Subjective Feeling pretty good. No consistent pain. Depends on the weather.    Currently in Pain? No/denies              [facility] PT Assessment - 12/01/20 0001      Transfers   Comments 5x sit to stand 15.2 sec                         [facility] Adult PT Treatment/Exercise - 12/01/20 0001      Neuro Re-ed    Neuro Re-ed Details  30 sec tandem stance Rt and Left. SLS 30 sec left, 20 sec right      Knee/Hip Exercises: Aerobic   Nustep L4 6 min LE only      Knee/Hip Exercises: Standing   Heel Raises Both;15 reps    Heel Raises Limitations toe raises x 15    Hip Flexion Right;Left;15 reps    Hip Flexion Limitations 4#    Hip Abduction Right;Left;15 reps    Abduction Limitations 4#    Hip Extension Right;Left;15 reps    Extension Limitations 4#      Knee/Hip Exercises: Seated   Sit to  Sand 5 reps;2 sets      Knee/Hip Exercises: Supine   Bridges 20 reps    Straight Leg Raises Right;Left;15 reps      Knee/Hip Exercises: Sidelying   Hip ABduction Right;Left    Hip ABduction Limitations 15 reps                    PT Short Term Goals - 12/01/20 0738      PT SHORT TERM GOAL #1   Title Therapy will review FOTO    Time 2    Period Weeks    Status Achieved      PT SHORT TERM GOAL #2   Title Therapy will perfrom full BERG balance test    Time 1    Period Weeks      PT SHORT TERM GOAL #3   Title Patient will improve sit to stand time by 8 seconds    Baseline 15.2  sec on 12/28  Time 3    Period Weeks    Status Partially Met      PT SHORT TERM GOAL #4   Title Patient will increase gross bilateral LE strength to 5/5    Time 3    Period Weeks    Status Unable to assess             PT Long Term Goals - 11/17/20 0739      PT LONG TERM GOAL #1   Title Patient will report no falls over a 3 week period    Status Achieved      PT LONG TERM GOAL #2   Title Patient will improve BERG score by 5 points    Baseline 2 pointts from 48 to 50    Status Partially Met      PT LONG TERM GOAL #3   Title Patient will walk her dog without feeling off balanced or having falls.    Baseline she reports no problem    Status Achieved                 Plan - 12/01/20 0720    Clinical Impression Statement Pt arrived on wrong day but was able to be seen. Continued with strengthening and began balance training. She was able to stand static tandem 30 sec without UE. 5 STS improved to 15.2 sec.    PT Next Visit Plan REview HEp and add to same.    PT Home Exercise Plan laq, hamstring curl calmshell all given for genral strengthening.   Stand DF/hip ext and abduction, mini squats           Patient will benefit from skilled therapeutic intervention in order to improve the following deficits and impairments:  Abnormal gait,Decreased range of motion,Decreased  endurance,Decreased strength,Decreased mobility,Decreased balance  Visit Diagnosis: Other abnormalities of gait and mobility  Frequent falls     Problem List Patient Active Problem List   Diagnosis Date Noted  . Iron deficiency anemia 01/20/2019  . Encounter for long-term (current) use of other medications 02/29/2012  . Hypertension   . Diabetes mellitus   . Hypokalemia   . RA (rheumatoid arthritis) ([place])   . EDEMA 08/27/2010  . CARPAL TUNNEL SYNDROME, BILATERAL 10/28/2009  . NUMBNESS 10/28/2009  . Diabetes mellitus without complication ([place]) 77/37/3668  . Hyperlipidemia 09/19/2007  . ALLERGIC RHINITIS 09/19/2007  . INSOMNIA 09/19/2007    Dorene Ar, PTA 12/01/2020, 7:52 AM  [facility] [address] [place], [place], [address] Phone: [phone]   Fax:  [phone]  Name: April [name] MRN: [id number] Date of Birth: 03-12-1941

## 2020-12-08 ENCOUNTER — Other Ambulatory Visit: Payer: Self-pay

## 2020-12-09 ENCOUNTER — Other Ambulatory Visit: Payer: Self-pay

## 2020-12-09 ENCOUNTER — Ambulatory Visit: Payer: HMO | Attending: Family Medicine

## 2020-12-09 DIAGNOSIS — R296 Repeated falls: Secondary | ICD-10-CM | POA: Insufficient documentation

## 2020-12-09 DIAGNOSIS — R2689 Other abnormalities of gait and mobility: Secondary | ICD-10-CM | POA: Insufficient documentation

## 2020-12-09 NOTE — Patient Instructions (Signed)
Supine bridge SLR clam and side lye clam and SLR and seated clam all with band as able (green issued)   4-5 days per week  10-15 reps

## 2020-12-09 NOTE — Therapy (Signed)
[place], [place], [address] Phone: [phone]   Fax:  [phone]  Physical Therapy Treatment/Discharge  Patient Details  Name: JAMYE BALICKI MRN: [id number] Date of Birth: August 08, 1941 Referring Provider (PT): Dr Kathyrn Lass    Encounter Date: 12/09/2020   PT End of Session - 12/09/20 0747    Visit Number 8    Number of Visits 12    Date for PT Re-Evaluation 12/11/20    Authorization Type [employer]    PT Start Time 0745    PT Stop Time 0825    PT Time Calculation (min) 40 min    Activity Tolerance Patient tolerated treatment well    Behavior During Therapy [facility] for tasks assessed/performed           Past Medical History:  Diagnosis Date  . Allergic rhinitis   . Diabetes mellitus    TYPE 2  . Dyslipidemia   . Hyperkalemia   . Hypertension   . Insomnia    Pt stated no trouble falling or staying asleep.  . RA (rheumatoid arthritis) ([place])     Past Surgical History:  Procedure Laterality Date  . BREAST CYST EXCISION Right 1967  . HAND TENDON SURGERY     Right  . OVARIAN CYST SURGERY Right 1988    There were no vitals filed for this visit.   Subjective Assessment - 12/09/20 0749    Subjective No pain.   No fall since starting PT    Currently in Pain? No/denies                             [facility] Adult PT Treatment/Exercise - 12/09/20 0001      Knee/Hip Exercises: Aerobic   Nustep L5  5 min LE only      Knee/Hip Exercises: Standing   Heel Raises Both;15 reps    Heel Raises Limitations toe raises x 15    Hip Abduction Right;Left;15 reps    Abduction Limitations 5#    Hip Extension Right;Left;15 reps    Extension Limitations 5#      Knee/Hip Exercises: Seated   Long Arc Quad Right;Left;20 reps    Long Arc Quad Weight 5 lbs.    Sit to Sand 5 reps;2 sets      Knee/Hip Exercises: Supine   Bridges 20 reps    Other Supine Knee/Hip Exercises  clams green band  x 20      Knee/Hip Exercises: Sidelying   Hip ABduction Right;Left    Hip ABduction Limitations 15 reps   green band                 PT Education - 12/09/20 0828    Education Details HEP    Person(s) Educated Patient    Methods Explanation;Tactile cues;Verbal cues;Handout    Comprehension Verbalized understanding;Returned demonstration            PT Short Term Goals - 12/01/20 0738      PT SHORT TERM GOAL #1   Title Therapy will review FOTO    Time 2    Period Weeks    Status Achieved      PT SHORT TERM GOAL #2   Title Therapy will perfrom full BERG balance test    Time 1    Period Weeks      PT SHORT TERM GOAL #3   Title Patient will improve sit to stand time by  8 seconds    Baseline 15.2  sec on 12/28    Time 3    Period Weeks    Status Partially Met      PT SHORT TERM GOAL #4   Title Patient will increase gross bilateral LE strength to 5/5    Time 3    Period Weeks    Status Unable to assess             PT Long Term Goals - 11/17/20 0739      PT LONG TERM GOAL #1   Title Patient will report no falls over a 3 week period    Status Achieved      PT LONG TERM GOAL #2   Title Patient will improve BERG score by 5 points    Baseline 2 pointts from 48 to 50    Status Partially Met      PT LONG TERM GOAL #3   Title Patient will walk her dog without feeling off balanced or having falls.    Baseline she reports no problem    Status Achieved                 Plan - 12/09/20 0749    Clinical Impression Statement Pt ready for discharge with HEP and no fall since start of PT . No pain recently  but depends on weather and activity.    PT Treatment/Interventions ADLs/Self Care Home Management;Gait training;Stair training;Therapeutic activities;Therapeutic exercise;Functional mobility training;Balance training;DME Instruction;Neuromuscular re-education;Patient/family education;Manual techniques;Passive range of motion;Taping     PT Next Visit Plan Discharge with HEP    PT Home Exercise Plan laq, hamstring curl calmshell all given for genral strengthening.   Stand DF/hip ext and abduction, mini squats   supine bridge,clam, SLR  side lye clam and abduction    Consulted and Agree with Plan of Care Patient           Patient will benefit from skilled therapeutic intervention in order to improve the following deficits and impairments:  Abnormal gait,Decreased range of motion,Decreased endurance,Decreased strength,Decreased mobility,Decreased balance  Visit Diagnosis: Other abnormalities of gait and mobility  Frequent falls     Problem List Patient Active Problem List   Diagnosis Date Noted  . Iron deficiency anemia 01/20/2019  . Encounter for long-term (current) use of other medications 02/29/2012  . Hypertension   . Diabetes mellitus   . Hypokalemia   . RA (rheumatoid arthritis) ([place])   . EDEMA 08/27/2010  . CARPAL TUNNEL SYNDROME, BILATERAL 10/28/2009  . NUMBNESS 10/28/2009  . Diabetes mellitus without complication ([place]) 42/68/3419  . Hyperlipidemia 09/19/2007  . ALLERGIC RHINITIS 09/19/2007  . INSOMNIA 09/19/2007    Darrel Hoover  PT 12/09/2020, 8:30 AM  [facility] [address] [place], [place], [address] Phone: [phone]   Fax:  [phone]  Name: TAIYA NUTTING MRN: [id number] Date of Birth: 03/09/1941  PHYSICAL THERAPY DISCHARGE SUMMARY  Visits from Start of Care: 8  Current functional level related to goals / functional outcomes: See above She has made gains but still appears to be some what unsteady and she was advised to be very careful with all activity She reports she is better at being aware.   Remaining deficits: Hip weakness and mildly unsteady   Education / Equipment: HEP  Plan: Patient agrees to discharge.  Patient goals were partially met. Patient is being discharged due to                                                      ?????  MAx benefit at this time with PT

## 2020-12-24 ENCOUNTER — Other Ambulatory Visit: Payer: Self-pay | Admitting: Endocrinology

## 2021-01-04 ENCOUNTER — Other Ambulatory Visit: Payer: Self-pay | Admitting: Endocrinology

## 2021-02-03 DIAGNOSIS — Z79899 Other long term (current) drug therapy: Secondary | ICD-10-CM | POA: Diagnosis not present

## 2021-02-03 DIAGNOSIS — E119 Type 2 diabetes mellitus without complications: Secondary | ICD-10-CM | POA: Diagnosis not present

## 2021-02-03 DIAGNOSIS — N289 Disorder of kidney and ureter, unspecified: Secondary | ICD-10-CM | POA: Diagnosis not present

## 2021-02-03 DIAGNOSIS — M150 Primary generalized (osteo)arthritis: Secondary | ICD-10-CM | POA: Diagnosis not present

## 2021-02-03 DIAGNOSIS — M79642 Pain in left hand: Secondary | ICD-10-CM | POA: Diagnosis not present

## 2021-02-03 DIAGNOSIS — M549 Dorsalgia, unspecified: Secondary | ICD-10-CM | POA: Diagnosis not present

## 2021-02-03 DIAGNOSIS — M0589 Other rheumatoid arthritis with rheumatoid factor of multiple sites: Secondary | ICD-10-CM | POA: Diagnosis not present

## 2021-02-03 DIAGNOSIS — M79641 Pain in right hand: Secondary | ICD-10-CM | POA: Diagnosis not present

## 2021-02-03 DIAGNOSIS — M8580 Other specified disorders of bone density and structure, unspecified site: Secondary | ICD-10-CM | POA: Diagnosis not present

## 2021-02-03 DIAGNOSIS — E7800 Pure hypercholesterolemia, unspecified: Secondary | ICD-10-CM | POA: Diagnosis not present

## 2021-02-05 DIAGNOSIS — M542 Cervicalgia: Secondary | ICD-10-CM | POA: Diagnosis not present

## 2021-02-22 ENCOUNTER — Ambulatory Visit: Payer: HMO

## 2021-02-25 ENCOUNTER — Other Ambulatory Visit: Payer: Self-pay | Admitting: Endocrinology

## 2021-03-10 DIAGNOSIS — M0589 Other rheumatoid arthritis with rheumatoid factor of multiple sites: Secondary | ICD-10-CM | POA: Diagnosis not present

## 2021-03-15 ENCOUNTER — Other Ambulatory Visit: Payer: Self-pay

## 2021-03-15 ENCOUNTER — Other Ambulatory Visit (INDEPENDENT_AMBULATORY_CARE_PROVIDER_SITE_OTHER): Payer: HMO

## 2021-03-15 DIAGNOSIS — E7800 Pure hypercholesterolemia, unspecified: Secondary | ICD-10-CM | POA: Diagnosis not present

## 2021-03-15 DIAGNOSIS — E119 Type 2 diabetes mellitus without complications: Secondary | ICD-10-CM | POA: Diagnosis not present

## 2021-03-15 LAB — LIPID PANEL
Cholesterol: 152 mg/dL (ref 0–200)
HDL: 72.4 mg/dL (ref >39.00)
LDL Cholesterol: 67 mg/dL (ref 0–99)
NonHDL: 79.17
Total CHOL/HDL Ratio: 2
Triglycerides: 63 mg/dL (ref 0.0–149.0)
VLDL: 12.6 mg/dL (ref 0.0–40.0)

## 2021-03-15 LAB — COMPREHENSIVE METABOLIC PANEL
ALT: 16 U/L (ref 0–35)
AST: 21 U/L (ref 0–37)
Albumin: 4.2 g/dL (ref 3.5–5.2)
Alkaline Phosphatase: 86 U/L (ref 39–117)
BUN: 25 mg/dL — ABNORMAL HIGH (ref 6–23)
CO2: 25 mEq/L (ref 19–32)
Calcium: 9.6 mg/dL (ref 8.4–10.5)
Chloride: 102 mEq/L (ref 96–112)
Creatinine, Ser: 1.38 mg/dL — ABNORMAL HIGH (ref 0.40–1.20)
GFR: 36.85 mL/min — ABNORMAL LOW (ref >60.00)
Glucose, Bld: 76 mg/dL (ref 70–99)
Potassium: 4.4 mEq/L (ref 3.5–5.1)
Sodium: 135 mEq/L (ref 135–145)
Total Bilirubin: 0.6 mg/dL (ref 0.2–1.2)
Total Protein: 7.2 g/dL (ref 6.0–8.3)

## 2021-03-15 LAB — MICROALBUMIN / CREATININE URINE RATIO
Creatinine,U: 174.6 mg/dL
Microalb Creat Ratio: 1.1 mg/g (ref 0.0–30.0)
Microalb, Ur: 2 mg/dL — ABNORMAL HIGH (ref 0.0–1.9)

## 2021-03-15 LAB — HEMOGLOBIN A1C: Hgb A1c MFr Bld: 5.9 % (ref 4.6–6.5)

## 2021-03-16 DIAGNOSIS — G894 Chronic pain syndrome: Secondary | ICD-10-CM | POA: Diagnosis not present

## 2021-03-16 DIAGNOSIS — M47817 Spondylosis without myelopathy or radiculopathy, lumbosacral region: Secondary | ICD-10-CM | POA: Diagnosis not present

## 2021-03-17 NOTE — Progress Notes (Signed)
Patient ID: April [name], female   DOB: 1941/08/11, 80 y.o.   MRN: [id number]   Reason for Appointment: follow-up of various problems  History of Present Illness    HYPERTENSION:  diagnosed around 1979 with symptoms of headaches Previously she was taking Avapro, clonidine 0.6 at bedtime and Dyazide but blood pressure was relatively high with this regimen  For evaluation of her hyperaldosteronism she was switched to labetalol 100 mg twice a day, Tenex 2 mg and doxazosin With this regimen her blood pressure has been much better She was subsequently started on Aldactone to help with her severe hypokalemia and doxazosin was stopped  She has been on a consistent daily regimen of Aldactone 100 mg, labetalol 100 mg twice a day with 12.5 mg hydrochlorothiazide   As before she is checking her blood pressure at home regularly, this is unchanged in the 120s/67-75 range No complaints of lightheadedness or weakness   BP Readings from Last 3 Encounters:  03/18/21 120/76  11/05/20 122/70  10/12/20 136/78   RENAL function as follows:   Lab Results  Component Value Date   CREATININE 1.38 (H) 03/15/2021   CREATININE 1.20 11/02/2020   CREATININE 1.04 06/29/2020     HYPOKALEMIA:   This previously had been a significant longstanding problem and associated with hypertension  Previously had  been prescribed 6 tablets of potassium daily She was off her diuretics and Avapro when her evaluation for hyperaldosteronism was done, aldosterone level was 6.0 along with a relatively low renin level  She has a normal potassium level consistently with Aldactone 100 mg daily along with 1 tablet of potassium 20 mEq daily  Lab Results  Component Value Date   K 4.4 03/15/2021     DIABETES type II:   She has had diabetes since at least 2015 This has been  well controlled with a regimen of metformin 1 g twice a day   A1c has been consistently upper normal, now  5.9  She was using  an Accu-Chek meter but has not had a meter for the last 2 months  Previous range: 99-116, after lunch 142 and after dinner 100 Lab glucose was 76  She takes Metformin 1 g twice a day regularly with meals and has been doing this long-term  She feels that her diet is consistently good including avoiding soft drinks with sugar Weight is slightly higher   Weight history:   Wt Readings from Last 3 Encounters:  03/18/21 160 lb (72.6 kg)  11/05/20 156 lb 9.6 oz (71 kg)  10/12/20 156 lb (70.8 kg)      Lab Results  Component Value Date   HGBA1C 5.9 03/15/2021   HGBA1C 5.8 11/02/2020   HGBA1C 5.9 06/29/2020   Lab Results  Component Value Date   MICROALBUR 2.0 (H) 03/15/2021   LDLCALC 67 03/15/2021   CREATININE 1.38 (H) 03/15/2021    OTHER active problems discussed today are in review of systems  LABS:  Lab on 03/15/2021  Component Date Value Ref Range Status  . Cholesterol 03/15/2021 152  0 - 200 mg/dL Final   ATP III Classification       Desirable:  < 200 mg/dL               Borderline High:  200 - 239 mg/dL          High:  > = 409 mg/dL  . Triglycerides 03/15/2021 63.0  0.0 - 149.0 mg/dL Final   Normal:  <811  mg/dLBorderline High:  150 - 199 mg/dL  . HDL 03/15/2021 72.40  >39.00 mg/dL Final  . VLDL 20/25/4270 12.6  0.0 - 40.0 mg/dL Final  . LDL Cholesterol 03/15/2021 67  0 - 99 mg/dL Final  . Total CHOL/HDL Ratio 03/15/2021 2   Final                  Men          Women1/2 Average Risk     3.4          3.3Average Risk          5.0          4.42X Average Risk          9.6          7.13X Average Risk          15.0          11.0                      . NonHDL 03/15/2021 79.17   Final   NOTE:  Non-HDL goal should be 30 mg/dL higher than patient's LDL goal (i.e. LDL goal of < 70 mg/dL, would have non-HDL goal of < 100 mg/dL)  . Microalb, Ur 03/15/2021 2.0* 0.0 - 1.9 mg/dL Final  . Creatinine,U 62/37/6283 174.6  mg/dL Final  . Microalb Creat Ratio 03/15/2021 1.1  0.0 - 30.0 mg/g  Final  . Sodium 03/15/2021 135  135 - 145 mEq/L Final  . Potassium 03/15/2021 4.4  3.5 - 5.1 mEq/L Final  . Chloride 03/15/2021 102  96 - 112 mEq/L Final  . CO2 03/15/2021 25  19 - 32 mEq/L Final  . Glucose, Bld 03/15/2021 76  70 - 99 mg/dL Final  . BUN 15/17/6160 25* 6 - 23 mg/dL Final  . Creatinine, Ser 03/15/2021 1.38* 0.40 - 1.20 mg/dL Final  . Total Bilirubin 03/15/2021 0.6  0.2 - 1.2 mg/dL Final  . Alkaline Phosphatase 03/15/2021 86  39 - 117 U/L Final  . AST 03/15/2021 21  0 - 37 U/L Final  . ALT 03/15/2021 16  0 - 35 U/L Final  . Total Protein 03/15/2021 7.2  6.0 - 8.3 g/dL Final  . Albumin 73/71/0626 4.2  3.5 - 5.2 g/dL Final  . GFR 94/85/4627 36.85* >60.00 mL/min Final   Calculated using the CKD-EPI Creatinine Equation (2021)  . Calcium 03/15/2021 9.6  8.4 - 10.5 mg/dL Final  . Hgb O3J MFr Bld 03/15/2021 5.9  4.6 - 6.5 % Final   Glycemic Control Guidelines for People with Diabetes:Non Diabetic:  <6%Goal of Therapy: <7%Additional Action Suggested:  >8%     Allergies as of 03/18/2021      Reactions   Acetaminophen Nausea Only   Actos [pioglitazone Hydrochloride]    edema   Aspirin Nausea Only   Atorvastatin Other (See Comments)   Felt drunk/high floating   Morphine Sulfate Other (See Comments)   Burns injecting "arm is on fire"   Naproxen Sodium Nausea Only   Sitagliptin Phosphate Nausea And Vomiting      Medication List       Accurate as of March 18, 2021  8:36 AM. If you have any questions, ask your nurse or doctor.        Accu-Chek Compact Plus test strip Generic drug: glucose blood Use to check blood sugar once a day dx code E11.9   Accu-Chek Softclix Lancets lancets Use to check blood sugar once  a day dx code E11.9   amitriptyline 25 MG tablet Commonly known as: ELAVIL Take 25 mg by mouth at bedtime.   Co Q 10 100 MG Caps Take by mouth.   ezetimibe 10 MG tablet Commonly known as: ZETIA Take 10 mg by mouth daily.   folic acid 0.5 MG  tablet Commonly known as: FOLVITE Take 0.5 mg by mouth daily.   gabapentin 300 MG capsule Commonly known as: NEURONTIN Take 300 mg by mouth daily.   Ginger 500 MG Caps Take 1 capsule by mouth daily.   hydrochlorothiazide 12.5 MG capsule Commonly known as: MICROZIDE Take 1 capsule by mouth once daily   labetalol 100 MG tablet Commonly known as: NORMODYNE TAKE 1 TABLET BY MOUTH TWICE A DAY   metFORMIN 1000 MG tablet Commonly known as: GLUCOPHAGE TAKE 1 TABLET BY MOUTH TWICE A DAY   methotrexate 2.5 MG tablet Commonly known as: RHEUMATREX Take 15 mg by mouth once a week. Patient takes 3 tablets twice once a week to add up to be 6 tablets once weekly, starts with 2 tablets in the evening and takes 2 more tablets in the morning   montelukast 10 MG tablet Commonly known as: SINGULAIR Take 10 mg by mouth daily.   potassium chloride SA 20 MEQ tablet Commonly known as: Klor-Con M20 TAKE 1/2 TABLET ( TOTAL) BY MOUTH ONCE DAILY.   pravastatin 80 MG tablet Commonly known as: PRAVACHOL TAKE 1 TABLET BY MOUTH DAILY   spironolactone 100 MG tablet Commonly known as: ALDACTONE TAKE 1 TABLET BY MOUTH EVERY DAY   tapentadol 50 MG tablet Commonly known as: NUCYNTA Take 50 mg by mouth as needed for severe pain (spasms).   Turmeric 450 MG Caps Take 1 capsule by mouth daily.   vitamin B-12 1000 MCG tablet Commonly known as: CYANOCOBALAMIN Take 1,000 mcg by mouth daily.       Allergies:  Allergies  Allergen Reactions  . Acetaminophen Nausea Only  . Actos [Pioglitazone Hydrochloride]     edema  . Aspirin Nausea Only  . Atorvastatin Other (See Comments)    Felt drunk/high floating  . Morphine Sulfate Other (See Comments)    Burns injecting "arm is on fire"  . Naproxen Sodium Nausea Only  . Sitagliptin Phosphate Nausea And Vomiting    Past Medical History:  Diagnosis Date  . Allergic rhinitis   . Diabetes mellitus    TYPE 2  . Dyslipidemia   . Hyperkalemia   .  Hypertension   . Insomnia    Pt stated no trouble falling or staying asleep.  . RA (rheumatoid arthritis) ([facility])     Past Surgical History:  Procedure Laterality Date  . BREAST CYST EXCISION Right 1967  . HAND TENDON SURGERY     Right  . OVARIAN CYST SURGERY Right 1988    Family History  Problem Relation Age of Onset  . Diabetes Mother   . Diabetes Father   . Cancer Father        Prostate  . Diabetes Sister   . Stroke Maternal Grandmother        Cause of death  . Healthy Brother     Social History:  reports that she has never smoked. She has never used smokeless tobacco. She reports that she does not drink alcohol and does not use drugs.  Review of Systems:   History of rheumatoid arthritis, on  Methotrexate, followed by rheumatologist Also takes gabapentin for pain  Vitamin D deficiency: She is taking  vitamin D3, 1000 U daily  LIPIDS: Taking Zetia from PCP and is also on pravastatin 80 mg LDL was 103 and now 67 Possibly had not been on Zetia on her last visit when LDL was higher  She does think that she has cut back on saturated fat foods since her last visit  Lab Results  Component Value Date   CHOL 152 03/15/2021   CHOL 193 11/02/2020   CHOL 180 02/24/2020   Lab Results  Component Value Date   HDL 72.40 03/15/2021   HDL 80.40 11/02/2020   HDL 71.20 02/24/2020   Lab Results  Component Value Date   LDLCALC 67 03/15/2021   LDLCALC 103 (H) 11/02/2020   LDLCALC 96 02/24/2020   Lab Results  Component Value Date   TRIG 63.0 03/15/2021   TRIG 47.0 11/02/2020   TRIG 65.0 02/24/2020   Lab Results  Component Value Date   CHOLHDL 2 03/15/2021   CHOLHDL 2 11/02/2020   CHOLHDL 3 02/24/2020   Lab Results  Component Value Date   LDLDIRECT 85.0 10/09/2019    Lab Results  Component Value Date   ALT 16 03/15/2021   ALT 24 01/15/2020   ALT 23 09/11/2017      Examination:   BP 120/76   Pulse 82   Ht 5\' 4"  (1.626 m)   Wt 160 lb (72.6 kg)   SpO2  98%   BMI 27.46 kg/m   Body mass index is 27.46 kg/m.    Standing blood pressure 110/70   Diabetic Foot Exam - Simple   Simple Foot Form Diabetic Foot exam was performed with the following findings: Yes   Visual Inspection See comments: Yes Sensation Testing Intact to touch and monofilament testing bilaterally: Yes Pulse Check See comments: Yes Comments Hyperpigmentation of feet present. Callus in the distal midfoot bilaterally, small areas of corn formation present Absent pedal pulses      Assesment/PLAN:  Hypertension:  Blood pressure is well controlled, it is today low normal especially standing  She is on a 3 drug regimen of  Aldactone 100 mg, labetalol and  HCTZ 12.5 mg  Her creatinine is relatively higher without any intake of nonsteroidal anti-inflammatory drugs For now we will reduce her Aldactone to half a tablet which should normalize her blood pressure and renal function; may have tendency to high blood pressure if HCTZ is stopped   Hypokalemia: She is requiring some potassium supplementation despite using Aldactone 100 mg Potassium 4.4 She will continue 1 tablet of 20 mEq as before  DIABETES: Well controlled with Metformin 1 g twice daily A1c is 5.9 She is again having stable control although has not checked readings at home New Accu-Chek meter given She will try to do as much walking and exercise as possible  LIPIDS: Likely had not been on Zetia on her last visit when LDL was higher, she will continue the same regimen since LDL is now 67  Pain on the plantar surfaces of her feet from walking likely from corn formation and she will go to her podiatrist Needs regular inspection of her feet   There are no Patient Instructions on file for this visit.   Reather Littler 03/18/2021, 8:36 AM

## 2021-03-18 ENCOUNTER — Encounter: Payer: Self-pay | Admitting: Endocrinology

## 2021-03-18 ENCOUNTER — Telehealth: Payer: Self-pay | Admitting: Endocrinology

## 2021-03-18 ENCOUNTER — Other Ambulatory Visit: Payer: Self-pay | Admitting: Endocrinology

## 2021-03-18 ENCOUNTER — Other Ambulatory Visit: Payer: Self-pay

## 2021-03-18 ENCOUNTER — Ambulatory Visit (INDEPENDENT_AMBULATORY_CARE_PROVIDER_SITE_OTHER): Payer: HMO | Admitting: Endocrinology

## 2021-03-18 VITALS — BP 120/76 | HR 82 | Ht 64.0 in | Wt 160.0 lb

## 2021-03-18 DIAGNOSIS — B070 Plantar wart: Secondary | ICD-10-CM

## 2021-03-18 DIAGNOSIS — N289 Disorder of kidney and ureter, unspecified: Secondary | ICD-10-CM | POA: Diagnosis not present

## 2021-03-18 DIAGNOSIS — E119 Type 2 diabetes mellitus without complications: Secondary | ICD-10-CM | POA: Diagnosis not present

## 2021-03-18 DIAGNOSIS — I10 Essential (primary) hypertension: Secondary | ICD-10-CM | POA: Diagnosis not present

## 2021-03-18 DIAGNOSIS — E7800 Pure hypercholesterolemia, unspecified: Secondary | ICD-10-CM | POA: Diagnosis not present

## 2021-03-18 MED ORDER — ACCU-CHEK GUIDE VI STRP: ORAL_STRIP | 12 refills | Status: DC

## 2021-03-18 NOTE — Telephone Encounter (Signed)
Patient called to advise that she does not need lancets - [phone]

## 2021-03-18 NOTE — Patient Instructions (Addendum)
Cut Spironolactone in 1/2  Check BP

## 2021-03-22 ENCOUNTER — Other Ambulatory Visit: Payer: Self-pay | Admitting: *Deleted

## 2021-03-22 MED ORDER — ONETOUCH ULTRA 2 W/DEVICE KIT: PACK | 1 refills | Status: DC

## 2021-03-22 MED ORDER — ONETOUCH ULTRA VI STRP: 1.0000 | ORAL_STRIP | 1 refills | Status: DC | PRN

## 2021-04-05 ENCOUNTER — Other Ambulatory Visit: Payer: Self-pay | Admitting: Endocrinology

## 2021-04-06 ENCOUNTER — Other Ambulatory Visit: Payer: Self-pay | Admitting: *Deleted

## 2021-04-06 ENCOUNTER — Telehealth: Payer: Self-pay | Admitting: Endocrinology

## 2021-04-06 MED ORDER — ACCU-CHEK GUIDE VI STRP: ORAL_STRIP | 12 refills | Status: DC

## 2021-04-06 NOTE — Telephone Encounter (Signed)
I contact her to check what strips she needs and she said the accu-chek guide strips, those were sent to her pharmacy, but I'm not clear if that is the preferred brand or if she's been switched to One Touch?

## 2021-04-06 NOTE — Telephone Encounter (Signed)
MEDICATION: test strips and pen needles  PHARMACY:   [id number] - [place], [place] - [address] Phone:  [phone]  Fax:  [phone]      HAS THE PATIENT CONTACTED THEIR PHARMACY?  no  IS THIS A 90 DAY SUPPLY : yes  IS PATIENT OUT OF MEDICATION: yes  IF NOT; HOW MUCH IS LEFT:   LAST APPOINTMENT DATE: @5 /01/2021  NEXT APPOINTMENT DATE:@5 /26/2022  DO WE HAVE YOUR PERMISSION TO LEAVE A DETAILED MESSAGE?:  OTHER COMMENTS:    **Let patient know to contact pharmacy at the end of the day to make sure medication is ready. **  ** Please notify patient to allow 48-72 hours to process**  **Encourage patient to contact the pharmacy for refills or they can request refills through [facility]**

## 2021-04-12 ENCOUNTER — Other Ambulatory Visit: Payer: Self-pay | Admitting: *Deleted

## 2021-04-12 MED ORDER — ONETOUCH DELICA PLUS LANCET33G MISC: 3 refills | Status: DC

## 2021-04-12 NOTE — Telephone Encounter (Signed)
Patient called re: PHARM told Patient they did not have a RX for the following:  MEDICATION: One Touch Delica Plus Lancets  PHARMACY:   [id number] [place], [place] - [address] Phone:  [phone]  Fax:  [phone]      HAS THE PATIENT CONTACTED THEIR PHARMACY?  Yes  IS THIS A 90 DAY SUPPLY : Yes  IS PATIENT OUT OF MEDICATION: No  IF NOT; HOW MUCH IS LEFT: 7 lancets  LAST APPOINTMENT DATE: @4 /14/2022  NEXT APPOINTMENT DATE:@5 /31/2022  DO WE HAVE YOUR PERMISSION TO LEAVE A DETAILED MESSAGE?: Yes  OTHER COMMENTS:    **Let patient know to contact pharmacy at the end of the day to make sure medication is ready. **  ** Please notify patient to allow 48-72 hours to process**  **Encourage patient to contact the pharmacy for refills or they can request refills through [facility]**

## 2021-04-12 NOTE — Telephone Encounter (Signed)
I spoke with her last week and she said it was the Accu-chek lancets she needed. Rx has been updated and one touch lancets have been sent to her pharmacy of choice

## 2021-04-22 DIAGNOSIS — T1490XA Injury, unspecified, initial encounter: Secondary | ICD-10-CM | POA: Diagnosis not present

## 2021-04-28 ENCOUNTER — Other Ambulatory Visit: Payer: Self-pay | Admitting: Endocrinology

## 2021-04-29 ENCOUNTER — Other Ambulatory Visit (INDEPENDENT_AMBULATORY_CARE_PROVIDER_SITE_OTHER): Payer: HMO

## 2021-04-29 ENCOUNTER — Other Ambulatory Visit: Payer: Self-pay

## 2021-04-29 DIAGNOSIS — N289 Disorder of kidney and ureter, unspecified: Secondary | ICD-10-CM

## 2021-04-29 LAB — BASIC METABOLIC PANEL
BUN: 22 mg/dL (ref 6–23)
CO2: 24 mEq/L (ref 19–32)
Calcium: 9.3 mg/dL (ref 8.4–10.5)
Chloride: 106 mEq/L (ref 96–112)
Creatinine, Ser: 1.25 mg/dL — ABNORMAL HIGH (ref 0.40–1.20)
GFR: 41.46 mL/min — ABNORMAL LOW (ref >60.00)
Glucose, Bld: 84 mg/dL (ref 70–99)
Potassium: 3.9 mEq/L (ref 3.5–5.1)
Sodium: 139 mEq/L (ref 135–145)

## 2021-05-04 ENCOUNTER — Other Ambulatory Visit: Payer: Self-pay

## 2021-05-04 ENCOUNTER — Ambulatory Visit (INDEPENDENT_AMBULATORY_CARE_PROVIDER_SITE_OTHER): Payer: HMO | Admitting: Endocrinology

## 2021-05-04 ENCOUNTER — Encounter: Payer: Self-pay | Admitting: Endocrinology

## 2021-05-04 VITALS — BP 120/80 | HR 83 | Ht 64.0 in | Wt 159.4 lb

## 2021-05-04 DIAGNOSIS — E876 Hypokalemia: Secondary | ICD-10-CM | POA: Diagnosis not present

## 2021-05-04 DIAGNOSIS — E119 Type 2 diabetes mellitus without complications: Secondary | ICD-10-CM

## 2021-05-04 DIAGNOSIS — I10 Essential (primary) hypertension: Secondary | ICD-10-CM | POA: Diagnosis not present

## 2021-05-04 NOTE — Progress Notes (Signed)
Patient ID: April [name], female   DOB: Sep 28, 1941, 80 y.o.   MRN: [id number]   Reason for Appointment: follow-up of various problems  History of Present Illness    HYPERTENSION:  diagnosed around 1979 with symptoms of headaches Previously she was taking Avapro, clonidine 0.6 at bedtime and Dyazide but blood pressure was relatively high with this regimen  For evaluation of her hyperaldosteronism she was switched to labetalol 100 mg twice a day, Tenex 2 mg and doxazosin With this regimen her blood pressure was much better She was subsequently started on Aldactone to help with her severe hypokalemia and doxazosin and guanfacine were stopped  She has been on a consistent regimen of Aldactone, labetalol 100 mg twice a day with 12.5 mg hydrochlorothiazide   Since her renal function was impaired in April 2022 she was told to break her 100 mg Aldactone in half However by mistake she stopped HCTZ also on her own  Currently not checking blood pressure at home as she cannot find her meter but at the pharmacy recently it was 120/77  Renal function improved and potassium are stable   BP Readings from Last 3 Encounters:  05/04/21 120/80  03/18/21 120/76  11/05/20 122/70   RENAL function as follows:   Lab Results  Component Value Date   CREATININE 1.25 (H) 04/29/2021   CREATININE 1.38 (H) 03/15/2021   CREATININE 1.20 11/02/2020     HYPOKALEMIA:   This previously had been a significant longstanding problem and associated with hypertension  Previously had  been prescribed 6 tablets of potassium daily She was off her diuretics and Avapro when her evaluation for hyperaldosteronism was done, aldosterone level was 6.0 along with a relatively low renin level  She has a normal potassium level consistently with Aldactone, now 50 mg daily along with 1 tablet of potassium 20 mEq daily  Lab Results  Component Value Date   K 3.9 04/29/2021     DIABETES type II:   She  has had diabetes since at least 2015 This has been  well controlled with a regimen of metformin 1 g twice a day   A1c has been consistently upper normal, now  5.9  She was using a One Touch ultra meter now because of her insurance not covering the Accu-Chek  She takes Metformin 1 g twice a day regularly with meals and has not needed any additional medications  Weight has leveled off  Recently has difficulty getting a blood sample with her new lancet device but her blood sugars are frequently good  Recent blood sugars range 81-128 with most readings done fasting AVERAGE 96  Weight history:   Wt Readings from Last 3 Encounters:  05/04/21 159 lb 6.4 oz (72.3 kg)  03/18/21 160 lb (72.6 kg)  11/05/20 156 lb 9.6 oz (71 kg)      Lab Results  Component Value Date   HGBA1C 5.9 03/15/2021   HGBA1C 5.8 11/02/2020   HGBA1C 5.9 06/29/2020   Lab Results  Component Value Date   MICROALBUR 2.0 (H) 03/15/2021   LDLCALC 67 03/15/2021   CREATININE 1.25 (H) 04/29/2021    OTHER active problems discussed today are in review of systems  LABS:  Lab on 04/29/2021  Component Date Value Ref Range Status  . Sodium 04/29/2021 139  135 - 145 mEq/L Final  . Potassium 04/29/2021 3.9  3.5 - 5.1 mEq/L Final  . Chloride 04/29/2021 106  96 - 112 mEq/L Final  . CO2 04/29/2021  24  19 - 32 mEq/L Final  . Glucose, Bld 04/29/2021 84  70 - 99 mg/dL Final  . BUN 04/29/2021 22  6 - 23 mg/dL Final  . Creatinine, Ser 04/29/2021 1.25* 0.40 - 1.20 mg/dL Final  . GFR 04/29/2021 41.46* >60.00 mL/min Final   Calculated using the CKD-EPI Creatinine Equation (2021)  . Calcium 04/29/2021 9.3  8.4 - 10.5 mg/dL Final    Allergies as of 05/04/2021      Reactions   Acetaminophen Nausea Only   Actos [pioglitazone Hydrochloride]    edema   Aspirin Nausea Only   Atorvastatin Other (See Comments)   Felt drunk/high floating   Morphine Sulfate Other (See Comments)   Burns injecting "arm is on fire"   Naproxen  Sodium Nausea Only   Sitagliptin Phosphate Nausea And Vomiting      Medication List       Accurate as of May 04, 2021  8:48 AM. If you have any questions, ask your nurse or doctor.        amitriptyline 25 MG tablet Commonly known as: ELAVIL Take 25 mg by mouth at bedtime.   Co Q 10 100 MG Caps Take by mouth.   ezetimibe 10 MG tablet Commonly known as: ZETIA Take 10 mg by mouth daily.   folic acid 0.5 MG tablet Commonly known as: FOLVITE Take 0.5 mg by mouth daily.   gabapentin 300 MG capsule Commonly known as: NEURONTIN Take 300 mg by mouth daily.   Ginger 500 MG Caps Take 1 capsule by mouth daily.   hydrochlorothiazide 12.5 MG capsule Commonly known as: MICROZIDE Take 1 capsule by mouth once daily   labetalol 100 MG tablet Commonly known as: NORMODYNE TAKE 1 TABLET BY MOUTH TWICE A DAY   leflunomide 10 MG tablet Commonly known as: ARAVA Take 10 mg by mouth daily.   metFORMIN 1000 MG tablet Commonly known as: GLUCOPHAGE TAKE 1 TABLET BY MOUTH TWICE A DAY   methotrexate 2.5 MG tablet Commonly known as: RHEUMATREX Take 15 mg by mouth once a week. Patient takes 3 tablets twice once a week to add up to be 6 tablets once weekly, starts with 2 tablets in the evening and takes 2 more tablets in the morning   montelukast 10 MG tablet Commonly known as: SINGULAIR Take 10 mg by mouth daily.   ONE TOUCH ULTRA 2 w/Device Kit Use to check blood sugar once a day DX code H37.1   OneTouch Delica Plus [id number] Misc Use to check blood sugar once a day. Dx code E11.9   OneTouch Ultra test strip Generic drug: glucose blood 1 each by Other route as needed for other. Use as instructed to check blood sugar once a day Dx Code E11.9   Accu-Chek Guide test strip Generic drug: glucose blood Use as instructed Use to check blood sugar once a day dx code E11.9   potassium chloride SA 20 MEQ tablet Commonly known as: Klor-Con M20 TAKE 1/2 TABLET (10MEQ TOTAL) BY MOUTH ONCE  DAILY.   pravastatin 80 MG tablet Commonly known as: PRAVACHOL TAKE 1 TABLET BY MOUTH EVERY DAY   spironolactone 100 MG tablet Commonly known as: ALDACTONE TAKE 1 TABLET BY MOUTH EVERY DAY What changed:   how much to take  when to take this  additional instructions   tapentadol 50 MG tablet Commonly known as: NUCYNTA Take 50 mg by mouth as needed for severe pain (spasms).   Turmeric 450 MG Caps Take 1 capsule by mouth  daily.   vitamin B-12 1000 MCG tablet Commonly known as: CYANOCOBALAMIN Take 1,000 mcg by mouth daily.       Allergies:  Allergies  Allergen Reactions  . Acetaminophen Nausea Only  . Actos [Pioglitazone Hydrochloride]     edema  . Aspirin Nausea Only  . Atorvastatin Other (See Comments)    Felt drunk/high floating  . Morphine Sulfate Other (See Comments)    Burns injecting "arm is on fire"  . Naproxen Sodium Nausea Only  . Sitagliptin Phosphate Nausea And Vomiting    Past Medical History:  Diagnosis Date  . Allergic rhinitis   . Diabetes mellitus    TYPE 2  . Dyslipidemia   . Hyperkalemia   . Hypertension   . Insomnia    Pt stated no trouble falling or staying asleep.  . RA (rheumatoid arthritis) ([place])     Past Surgical History:  Procedure Laterality Date  . BREAST CYST EXCISION Right 1967  . HAND TENDON SURGERY     Right  . OVARIAN CYST SURGERY Right 1988    Family History  Problem Relation Age of Onset  . Diabetes Mother   . Diabetes Father   . Cancer Father        Prostate  . Diabetes Sister   . Stroke Maternal Grandmother        Cause of death  . Healthy Brother     Social History:  reports that she has never smoked. She has never used smokeless tobacco. She reports that she does not drink alcohol and does not use drugs.  Review of Systems:   History of rheumatoid arthritis followed by rheumatologist Also takes gabapentin for pain  Vitamin D deficiency: She is taking vitamin D3, 1000 U daily  LIPIDS: Taking  Zetia from PCP and is also on pravastatin 80 mg LDL was 103 when she ran out of Zetia and now 67   Lab Results  Component Value Date   CHOL 152 03/15/2021   CHOL 193 11/02/2020   CHOL 180 02/24/2020   Lab Results  Component Value Date   HDL 72.40 03/15/2021   HDL 80.40 11/02/2020   HDL 71.20 02/24/2020   Lab Results  Component Value Date   LDLCALC 67 03/15/2021   LDLCALC 103 (H) 11/02/2020   LDLCALC 96 02/24/2020   Lab Results  Component Value Date   TRIG 63.0 03/15/2021   TRIG 47.0 11/02/2020   TRIG 65.0 02/24/2020   Lab Results  Component Value Date   CHOLHDL 2 03/15/2021   CHOLHDL 2 11/02/2020   CHOLHDL 3 02/24/2020   Lab Results  Component Value Date   LDLDIRECT 85.0 10/09/2019    Lab Results  Component Value Date   ALT 16 03/15/2021   ALT 24 01/15/2020   ALT 23 09/11/2017      Examination:   BP 120/80 (BP Location: Left Arm, Patient Position: Sitting, Cuff Size: Normal)   Pulse 83   Ht 5' 4"  (1.626 m)   Wt 159 lb 6.4 oz (72.3 kg)   SpO2 93%   BMI 27.36 kg/m   Body mass index is 27.36 kg/m.    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   Elayne Snare 05/04/2021, 8:48 AM

## 2021-05-11 DIAGNOSIS — E7800 Pure hypercholesterolemia, unspecified: Secondary | ICD-10-CM | POA: Diagnosis not present

## 2021-05-11 DIAGNOSIS — M549 Dorsalgia, unspecified: Secondary | ICD-10-CM | POA: Diagnosis not present

## 2021-05-11 DIAGNOSIS — M8580 Other specified disorders of bone density and structure, unspecified site: Secondary | ICD-10-CM | POA: Diagnosis not present

## 2021-05-11 DIAGNOSIS — Z79899 Other long term (current) drug therapy: Secondary | ICD-10-CM | POA: Diagnosis not present

## 2021-05-11 DIAGNOSIS — N289 Disorder of kidney and ureter, unspecified: Secondary | ICD-10-CM | POA: Diagnosis not present

## 2021-05-11 DIAGNOSIS — M0589 Other rheumatoid arthritis with rheumatoid factor of multiple sites: Secondary | ICD-10-CM | POA: Diagnosis not present

## 2021-05-11 DIAGNOSIS — E119 Type 2 diabetes mellitus without complications: Secondary | ICD-10-CM | POA: Diagnosis not present

## 2021-05-11 DIAGNOSIS — M150 Primary generalized (osteo)arthritis: Secondary | ICD-10-CM | POA: Diagnosis not present

## 2021-05-11 DIAGNOSIS — R197 Diarrhea, unspecified: Secondary | ICD-10-CM | POA: Diagnosis not present

## 2021-06-04 ENCOUNTER — Telehealth: Payer: Self-pay | Admitting: Endocrinology

## 2021-06-04 DIAGNOSIS — E119 Type 2 diabetes mellitus without complications: Secondary | ICD-10-CM

## 2021-06-04 NOTE — Telephone Encounter (Signed)
REFILL REQUEST -   Metformin  PHARMACY -   [id number] [place], [place] - [address] Phone:  [phone]  Fax:  [phone]

## 2021-06-08 MED ORDER — METFORMIN HCL 1000 MG PO TABS: 1000.0000 mg | ORAL_TABLET | Freq: Two times a day (BID) | ORAL | 1 refills | Status: DC

## 2021-06-08 NOTE — Telephone Encounter (Signed)
Rx sent to preferred pharmacy.

## 2021-07-09 ENCOUNTER — Other Ambulatory Visit: Payer: Self-pay | Admitting: Endocrinology

## 2021-07-20 DIAGNOSIS — G894 Chronic pain syndrome: Secondary | ICD-10-CM | POA: Diagnosis not present

## 2021-07-26 DIAGNOSIS — N289 Disorder of kidney and ureter, unspecified: Secondary | ICD-10-CM | POA: Diagnosis not present

## 2021-07-26 DIAGNOSIS — M0589 Other rheumatoid arthritis with rheumatoid factor of multiple sites: Secondary | ICD-10-CM | POA: Diagnosis not present

## 2021-07-26 DIAGNOSIS — Z79899 Other long term (current) drug therapy: Secondary | ICD-10-CM | POA: Diagnosis not present

## 2021-07-26 DIAGNOSIS — E119 Type 2 diabetes mellitus without complications: Secondary | ICD-10-CM | POA: Diagnosis not present

## 2021-07-26 DIAGNOSIS — M549 Dorsalgia, unspecified: Secondary | ICD-10-CM | POA: Diagnosis not present

## 2021-07-26 DIAGNOSIS — M150 Primary generalized (osteo)arthritis: Secondary | ICD-10-CM | POA: Diagnosis not present

## 2021-07-26 DIAGNOSIS — E7800 Pure hypercholesterolemia, unspecified: Secondary | ICD-10-CM | POA: Diagnosis not present

## 2021-07-26 DIAGNOSIS — M8580 Other specified disorders of bone density and structure, unspecified site: Secondary | ICD-10-CM | POA: Diagnosis not present

## 2021-08-03 ENCOUNTER — Other Ambulatory Visit (INDEPENDENT_AMBULATORY_CARE_PROVIDER_SITE_OTHER): Payer: HMO

## 2021-08-03 ENCOUNTER — Other Ambulatory Visit: Payer: Self-pay

## 2021-08-03 DIAGNOSIS — E119 Type 2 diabetes mellitus without complications: Secondary | ICD-10-CM | POA: Diagnosis not present

## 2021-08-03 LAB — BASIC METABOLIC PANEL
BUN: 29 mg/dL — ABNORMAL HIGH (ref 6–23)
CO2: 24 mEq/L (ref 19–32)
Calcium: 9.9 mg/dL (ref 8.4–10.5)
Chloride: 105 mEq/L (ref 96–112)
Creatinine, Ser: 1.16 mg/dL (ref 0.40–1.20)
GFR: 45.26 mL/min — ABNORMAL LOW (ref >60.00)
Glucose, Bld: 72 mg/dL (ref 70–99)
Potassium: 3.8 mEq/L (ref 3.5–5.1)
Sodium: 138 mEq/L (ref 135–145)

## 2021-08-03 LAB — HEMOGLOBIN A1C: Hgb A1c MFr Bld: 5.8 % (ref 4.6–6.5)

## 2021-08-04 NOTE — Progress Notes (Deleted)
Patient ID: April [name], female   DOB: [birth year], 80 y.o.   MRN: [id number]   Reason for Appointment: follow-up of various problems  History of Present Illness    HYPERTENSION:  diagnosed around 1979 with symptoms of headaches Previously she was taking Avapro, clonidine 0.6 at bedtime and Dyazide but blood pressure was relatively high with this regimen  For evaluation of her hyperaldosteronism she was switched to labetalol 100 mg twice a day, Tenex 2 mg and doxazosin With this regimen her blood pressure was much better She was subsequently started on Aldactone to help with her severe hypokalemia and doxazosin and guanfacine were stopped  She has been on a consistent regimen of Aldactone, labetalol 100 mg twice a day with 12.5 mg hydrochlorothiazide   Since her renal function was impaired in April 2022 she was told to break her 100 mg Aldactone in half However by mistake she stopped HCTZ also on her own  Currently not checking blood pressure at home as she cannot find her meter but at the pharmacy recently it was 120/77  Renal function improved and potassium are stable   BP Readings from Last 3 Encounters:  05/04/21 120/80  03/18/21 120/76  11/05/20 122/70   RENAL function as follows:   Lab Results  Component Value Date   CREATININE 1.16 08/03/2021   CREATININE 1.25 (H) 04/29/2021   CREATININE 1.38 (H) 03/15/2021     HYPOKALEMIA:   This previously had been a significant longstanding problem and associated with hypertension  Previously had  been prescribed 6 tablets of potassium daily She was off her diuretics and Avapro when her evaluation for hyperaldosteronism was done, aldosterone level was 6.0 along with a relatively low renin level  She has a normal potassium level consistently with Aldactone, now 50 mg daily along with 1 tablet of potassium 20 mEq daily  Lab Results  Component Value Date   K 3.8 08/03/2021     DIABETES type II:   She  has had diabetes since at least 2015 This has been  well controlled with a regimen of metformin 1 g twice a day   A1c has been consistently upper normal, now  5.9  She was using a One Touch ultra meter now because of her insurance not covering the Accu-Chek  She takes Metformin 1 g twice a day regularly with meals and has not needed any additional medications  Weight has leveled off  Recently has difficulty getting a blood sample with her new lancet device but her blood sugars are frequently good  Recent blood sugars range 81-128 with most readings done fasting AVERAGE 96  Weight history:   Wt Readings from Last 3 Encounters:  05/04/21 159 lb 6.4 oz (72.3 kg)  03/18/21 160 lb (72.6 kg)  11/05/20 156 lb 9.6 oz (71 kg)      Lab Results  Component Value Date   HGBA1C 5.8 08/03/2021   HGBA1C 5.9 03/15/2021   HGBA1C 5.8 11/02/2020   Lab Results  Component Value Date   MICROALBUR 2.0 (H) 03/15/2021   LDLCALC 67 03/15/2021   CREATININE 1.16 08/03/2021    OTHER active problems discussed today are in review of systems  LABS:  Lab on 08/03/2021  Component Date Value Ref Range Status   Sodium 08/03/2021 138  135 - 145 mEq/L Final   Potassium 08/03/2021 3.8  3.5 - 5.1 mEq/L Final   Chloride 08/03/2021 105  96 - 112 mEq/L Final   CO2 08/03/2021 24  19 - 32 mEq/L Final   Glucose, Bld 08/03/2021 72  70 - 99 mg/dL Final   BUN 08/03/2021 29 (A) 6 - 23 mg/dL Final   Creatinine, Ser 08/03/2021 1.16  0.40 - 1.20 mg/dL Final   GFR 08/03/2021 45.26 (A) >60.00 mL/min Final   Calculated using the CKD-EPI Creatinine Equation (2021)   Calcium 08/03/2021 9.9  8.4 - 10.5 mg/dL Final   Hgb A1c MFr Bld 08/03/2021 5.8  4.6 - 6.5 % Final   Glycemic Control Guidelines for People with Diabetes:Non Diabetic:  <6%Goal of Therapy: <7%Additional Action Suggested:  >8%     Allergies as of 08/05/2021       Reactions   Acetaminophen Nausea Only   Actos [pioglitazone Hydrochloride]    edema    Aspirin Nausea Only   Atorvastatin Other (See Comments)   Felt drunk/high floating   Morphine Sulfate Other (See Comments)   Burns injecting "arm is on fire"   Naproxen Sodium Nausea Only   Sitagliptin Phosphate Nausea And Vomiting        Medication List        Accurate as of August 04, 2021  9:10 PM. If you have any questions, ask your nurse or doctor.          amitriptyline 25 MG tablet Commonly known as: ELAVIL Take 25 mg by mouth at bedtime.   Co Q 10 100 MG Caps Take by mouth.   ezetimibe 10 MG tablet Commonly known as: ZETIA Take 10 mg by mouth daily.   folic acid 0.5 MG tablet Commonly known as: FOLVITE Take 0.5 mg by mouth daily.   gabapentin 300 MG capsule Commonly known as: NEURONTIN Take 300 mg by mouth daily.   Ginger 500 MG Caps Take 1 capsule by mouth daily.   labetalol 100 MG tablet Commonly known as: NORMODYNE TAKE 1 TABLET BY MOUTH TWICE A DAY   leflunomide 10 MG tablet Commonly known as: ARAVA Take 10 mg by mouth daily.   metFORMIN 1000 MG tablet Commonly known as: GLUCOPHAGE Take 1 tablet (1,000 mg total) by mouth 2 (two) times daily.   methotrexate 2.5 MG tablet Commonly known as: RHEUMATREX Take 15 mg by mouth once a week. Patient takes 3 tablets twice once a week to add up to be 6 tablets once weekly, starts with 2 tablets in the evening and takes 2 more tablets in the morning   montelukast 10 MG tablet Commonly known as: SINGULAIR Take 10 mg by mouth daily.   ONE TOUCH ULTRA 2 w/Device Kit Use to check blood sugar once a day DX code I77.8   OneTouch Delica Plus [id number] Misc Use to check blood sugar once a day. Dx code E11.9   OneTouch Ultra test strip Generic drug: glucose blood 1 each by Other route as needed for other. Use as instructed to check blood sugar once a day Dx Code E11.9   potassium chloride SA 20 MEQ tablet Commonly known as: Klor-Con M20 TAKE 1/2 TABLET (10MEQ TOTAL) BY MOUTH ONCE DAILY.   pravastatin  80 MG tablet Commonly known as: PRAVACHOL TAKE 1 TABLET BY MOUTH EVERY DAY   spironolactone 100 MG tablet Commonly known as: ALDACTONE TAKE 1 TABLET BY MOUTH EVERY DAY What changed:  how much to take when to take this additional instructions   tapentadol 50 MG tablet Commonly known as: NUCYNTA Take 50 mg by mouth as needed for severe pain (spasms).   Turmeric 450 MG Caps Take 1 capsule by mouth  daily.   vitamin B-12 1000 MCG tablet Commonly known as: CYANOCOBALAMIN Take 1,000 mcg by mouth daily.        Allergies:  Allergies  Allergen Reactions   Acetaminophen Nausea Only   Actos [Pioglitazone Hydrochloride]     edema   Aspirin Nausea Only   Atorvastatin Other (See Comments)    Felt drunk/high floating   Morphine Sulfate Other (See Comments)    Burns injecting "arm is on fire"   Naproxen Sodium Nausea Only   Sitagliptin Phosphate Nausea And Vomiting    Past Medical History:  Diagnosis Date   Allergic rhinitis    Diabetes mellitus    TYPE 2   Dyslipidemia    Hyperkalemia    Hypertension    Insomnia    Pt stated no trouble falling or staying asleep.   RA (rheumatoid arthritis) (HCC)     Past Surgical History:  Procedure Laterality Date   BREAST CYST EXCISION Right 1967   HAND TENDON SURGERY     Right   OVARIAN CYST SURGERY Right 1988    Family History  Problem Relation Age of Onset   Diabetes Mother    Diabetes Father    Cancer Father        Prostate   Diabetes Sister    Stroke Maternal Grandmother        Cause of death   Healthy Brother     Social History:  reports that she has never smoked. She has never used smokeless tobacco. She reports that she does not drink alcohol and does not use drugs.  Review of Systems:   History of rheumatoid arthritis followed by rheumatologist Also takes gabapentin for pain  Vitamin D deficiency: She is taking vitamin D3, 1000 U daily  LIPIDS: Taking Zetia from PCP and is also on pravastatin 80 mg LDL  was 103 when she ran out of Zetia and now 67   Lab Results  Component Value Date   CHOL 152 03/15/2021   CHOL 193 11/02/2020   CHOL 180 02/24/2020   Lab Results  Component Value Date   HDL 72.40 03/15/2021   HDL 80.40 11/02/2020   HDL 71.20 02/24/2020   Lab Results  Component Value Date   LDLCALC 67 03/15/2021   LDLCALC 103 (H) 11/02/2020   LDLCALC 96 02/24/2020   Lab Results  Component Value Date   TRIG 63.0 03/15/2021   TRIG 47.0 11/02/2020   TRIG 65.0 02/24/2020   Lab Results  Component Value Date   CHOLHDL 2 03/15/2021   CHOLHDL 2 11/02/2020   CHOLHDL 3 02/24/2020   Lab Results  Component Value Date   LDLDIRECT 85.0 10/09/2019    Lab Results  Component Value Date   ALT 16 03/15/2021   ALT 24 01/15/2020   ALT 23 09/11/2017      Examination:   There were no vitals taken for this visit.  There is no height or weight on file to calculate BMI.    No ankle edema present  Assesment/PLAN:  Hypertension:  Blood pressure is well controlled and not as low with reducing Aldactone and stopping HCTZ  Currently only on labetalol 100 mg twice daily and Aldactone 50 mg  Her creatinine is improved with reducing her diuretics We will continue same medication Encouraged her to start taking blood pressure at home and she will try to find her own meter   Hypokalemia: She is requiring potassium supplementation even with using Aldactone and she can continue Likely has renal potassium  wasting of unclear etiology  DIABETES: Well controlled with Metformin 1 g twice daily Recently blood glucose readings at home are excellent However she is having some issues with getting adequate blood samples with new current lancet device   Will try to get her the Contour next lancet device to see if this is better for fingersticks compared to the One Touch device If she still getting insufficient blood sample may try her on the Verio meter  Also reminded her to check more readings  after meals Her meter was set to the right time of day  Follow-up in 3 months   There are no Patient Instructions on file for this visit.   Elayne Snare 08/04/2021, 9:10 PM

## 2021-08-05 ENCOUNTER — Ambulatory Visit: Payer: HMO | Admitting: Endocrinology

## 2021-08-11 ENCOUNTER — Ambulatory Visit (INDEPENDENT_AMBULATORY_CARE_PROVIDER_SITE_OTHER): Payer: HMO | Admitting: Endocrinology

## 2021-08-11 ENCOUNTER — Other Ambulatory Visit: Payer: Self-pay

## 2021-08-11 ENCOUNTER — Encounter: Payer: Self-pay | Admitting: Endocrinology

## 2021-08-11 VITALS — BP 112/70 | HR 76 | Ht 63.25 in | Wt 156.6 lb

## 2021-08-11 DIAGNOSIS — E7800 Pure hypercholesterolemia, unspecified: Secondary | ICD-10-CM

## 2021-08-11 DIAGNOSIS — I10 Essential (primary) hypertension: Secondary | ICD-10-CM

## 2021-08-11 DIAGNOSIS — E876 Hypokalemia: Secondary | ICD-10-CM | POA: Diagnosis not present

## 2021-08-11 DIAGNOSIS — E119 Type 2 diabetes mellitus without complications: Secondary | ICD-10-CM | POA: Diagnosis not present

## 2021-08-11 NOTE — Patient Instructions (Addendum)
Check blood sugars on waking up 3 days a week  Also check blood sugars about 2 hours after meals and do this after different meals by rotation  Recommended blood sugar levels on waking up are 90-120 and about 2 hours after meal is 130-160  Please bring your blood sugar monitor to each visit, thank you  

## 2021-08-11 NOTE — Progress Notes (Signed)
Patient ID: April [name], female   DOB: 05-27-1941, 80 y.o.   MRN: [id number]   Reason for Appointment: follow-up of various problems  History of Present Illness    HYPERTENSION:  diagnosed around 1979 with symptoms of headaches Previously she was taking Avapro, clonidine 0.6 at bedtime and Dyazide but blood pressure was relatively high with this regimen  For evaluation of her hyperaldosteronism she was switched to labetalol 100 mg twice a day, Tenex 2 mg and doxazosin With this regimen her blood pressure was much better She was subsequently started on Aldactone to help with her severe hypokalemia and doxazosin and guanfacine were stopped  CURRENT regimen of Aldactone 50 mg daily, labetalol 100 mg twice a day   Since her renal function was impaired in April 2022 she was told to break her 100 mg Aldactone in half Also she had stopped HCTZ  Is checking blood pressure at home periodically and she thinks her readings are systolic in the 638V  Renal function improved compared to the last 2 times   BP Readings from Last 3 Encounters:  08/11/21 112/70  05/04/21 120/80  03/18/21 120/76   RENAL function as follows:   Lab Results  Component Value Date   CREATININE 1.16 08/03/2021   CREATININE 1.25 (H) 04/29/2021   CREATININE 1.38 (H) 03/15/2021     HYPOKALEMIA:   This previously had been a significant longstanding problem and associated with hypertension  Previously had  been prescribed 6 tablets of potassium daily She was off her diuretics and Avapro when her evaluation for hyperaldosteronism was done, aldosterone level was 6.0 along with a relatively low renin level  She has a normal potassium level consistently with Aldactone, now 50 mg daily along with 1 tablet of potassium 20 mEq daily  Lab Results  Component Value Date   K 3.8 08/03/2021     DIABETES type II:   She has had diabetes since at least 2015 This has been  well controlled with a  regimen of metformin 1 g twice a day   A1c has been consistently upper normal, now  5.9  She was using a One Touch ultra meter  She takes Metformin 1 g twice a day regularly with meals She has generally good control with monotherapy  Weight has come down 3 pounds  Recent blood sugars range 87-100 with her readings done fasting as she forgets to check them later in the day Because of her joint pain she cannot exercise Usually she thinks she is eating healthy meals  Weight history:   Wt Readings from Last 3 Encounters:  08/11/21 156 lb 9.6 oz (71 kg)  05/04/21 159 lb 6.4 oz (72.3 kg)  03/18/21 160 lb (72.6 kg)      Lab Results  Component Value Date   HGBA1C 5.8 08/03/2021   HGBA1C 5.9 03/15/2021   HGBA1C 5.8 11/02/2020   Lab Results  Component Value Date   MICROALBUR 2.0 (H) 03/15/2021   LDLCALC 67 03/15/2021   CREATININE 1.16 08/03/2021    OTHER active problems discussed today are in review of systems  LABS:  No visits with results within 1 Week(s) from this visit.  Latest known visit with results is:  Lab on 08/03/2021  Component Date Value Ref Range Status   Sodium 08/03/2021 138  135 - 145 mEq/L Final   Potassium 08/03/2021 3.8  3.5 - 5.1 mEq/L Final   Chloride 08/03/2021 105  96 - 112 mEq/L Final   CO2  08/03/2021 24  19 - 32 mEq/L Final   Glucose, Bld 08/03/2021 72  70 - 99 mg/dL Final   BUN 08/03/2021 29 (A) 6 - 23 mg/dL Final   Creatinine, Ser 08/03/2021 1.16  0.40 - 1.20 mg/dL Final   GFR 08/03/2021 45.26 (A) >60.00 mL/min Final   Calculated using the CKD-EPI Creatinine Equation (2021)   Calcium 08/03/2021 9.9  8.4 - 10.5 mg/dL Final   Hgb A1c MFr Bld 08/03/2021 5.8  4.6 - 6.5 % Final   Glycemic Control Guidelines for People with Diabetes:Non Diabetic:  <6%Goal of Therapy: <7%Additional Action Suggested:  >8%     Allergies as of 08/11/2021       Reactions   Acetaminophen Nausea Only   Actos [pioglitazone Hydrochloride]    edema   Aspirin Nausea  Only   Atorvastatin Other (See Comments)   Felt drunk/high floating   Morphine Sulfate Other (See Comments)   Burns injecting "arm is on fire"   Naproxen Sodium Nausea Only   Sitagliptin Phosphate Nausea And Vomiting        Medication List        Accurate as of August 11, 2021  2:00 PM. If you have any questions, ask your nurse or doctor.          STOP taking these medications    amitriptyline 25 MG tablet Commonly known as: ELAVIL Stopped by: Elayne Snare, MD       TAKE these medications    Co Q 10 100 MG Caps Take by mouth.   ezetimibe 10 MG tablet Commonly known as: ZETIA Take 10 mg by mouth daily.   folic acid 0.5 MG tablet Commonly known as: FOLVITE Take 0.5 mg by mouth daily.   gabapentin 300 MG capsule Commonly known as: NEURONTIN Take 300 mg by mouth daily.   Ginger 500 MG Caps Take 1 capsule by mouth daily.   labetalol 100 MG tablet Commonly known as: NORMODYNE TAKE 1 TABLET BY MOUTH TWICE A DAY   leflunomide 10 MG tablet Commonly known as: ARAVA Take 10 mg by mouth daily.   metFORMIN 1000 MG tablet Commonly known as: GLUCOPHAGE Take 1 tablet (1,000 mg total) by mouth 2 (two) times daily.   methotrexate 2.5 MG tablet Commonly known as: RHEUMATREX Take 15 mg by mouth once a week. Patient takes 3 tablets twice once a week to add up to be 6 tablets once weekly, starts with 2 tablets in the evening and takes 2 more tablets in the morning   montelukast 10 MG tablet Commonly known as: SINGULAIR Take 10 mg by mouth daily.   ONE TOUCH ULTRA 2 w/Device Kit Use to check blood sugar once a day DX code Q94.5   OneTouch Delica Plus [id number] Misc Use to check blood sugar once a day. Dx code E11.9   OneTouch Ultra test strip Generic drug: glucose blood 1 each by Other route as needed for other. Use as instructed to check blood sugar once a day Dx Code E11.9   potassium chloride SA 20 MEQ tablet Commonly known as: Klor-Con M20 TAKE 1/2  TABLET (10MEQ TOTAL) BY MOUTH ONCE DAILY.   pravastatin 80 MG tablet Commonly known as: PRAVACHOL TAKE 1 TABLET BY MOUTH EVERY DAY   predniSONE 10 MG tablet Commonly known as: DELTASONE Take by mouth.   spironolactone 100 MG tablet Commonly known as: ALDACTONE TAKE 1 TABLET BY MOUTH EVERY DAY What changed:  how much to take when to take this additional instructions  tapentadol 50 MG tablet Commonly known as: NUCYNTA Take 50 mg by mouth as needed for severe pain (spasms).   Turmeric 450 MG Caps Take 1 capsule by mouth daily.   vitamin B-12 1000 MCG tablet Commonly known as: CYANOCOBALAMIN Take 1,000 mcg by mouth daily.        Allergies:  Allergies  Allergen Reactions   Acetaminophen Nausea Only   Actos [Pioglitazone Hydrochloride]     edema   Aspirin Nausea Only   Atorvastatin Other (See Comments)    Felt drunk/high floating   Morphine Sulfate Other (See Comments)    Burns injecting "arm is on fire"   Naproxen Sodium Nausea Only   Sitagliptin Phosphate Nausea And Vomiting    Past Medical History:  Diagnosis Date   Allergic rhinitis    Diabetes mellitus    TYPE 2   Dyslipidemia    Hyperkalemia    Hypertension    Insomnia    Pt stated no trouble falling or staying asleep.   RA (rheumatoid arthritis) (HCC)     Past Surgical History:  Procedure Laterality Date   BREAST CYST EXCISION Right 1967   HAND TENDON SURGERY     Right   OVARIAN CYST SURGERY Right 1988    Family History  Problem Relation Age of Onset   Diabetes Mother    Diabetes Father    Cancer Father        Prostate   Diabetes Sister    Stroke Maternal Grandmother        Cause of death   Healthy Brother     Social History:  reports that she has never smoked. She has never used smokeless tobacco. She reports that she does not drink alcohol and does not use drugs.  Review of Systems:   History of rheumatoid arthritis followed by rheumatologist Recently started taking 10 mg  prednisone Also takes gabapentin for pain  Vitamin D deficiency: She is taking vitamin D3, 1000 U daily  LIPIDS: Taking Zetia from PCP and is also on pravastatin 80 mg LDL improved with adding Zetia, last 67   Lab Results  Component Value Date   CHOL 152 03/15/2021   CHOL 193 11/02/2020   CHOL 180 02/24/2020   Lab Results  Component Value Date   HDL 72.40 03/15/2021   HDL 80.40 11/02/2020   HDL 71.20 02/24/2020   Lab Results  Component Value Date   LDLCALC 67 03/15/2021   LDLCALC 103 (H) 11/02/2020   LDLCALC 96 02/24/2020   Lab Results  Component Value Date   TRIG 63.0 03/15/2021   TRIG 47.0 11/02/2020   TRIG 65.0 02/24/2020   Lab Results  Component Value Date   CHOLHDL 2 03/15/2021   CHOLHDL 2 11/02/2020   CHOLHDL 3 02/24/2020   Lab Results  Component Value Date   LDLDIRECT 85.0 10/09/2019    Lab Results  Component Value Date   ALT 16 03/15/2021   ALT 24 01/15/2020   ALT 23 09/11/2017      Examination:   BP 112/70   Pulse 76   Ht 5' 3.25" (1.607 m)   Wt 156 lb 9.6 oz (71 kg)   SpO2 96%   BMI 27.52 kg/m   Body mass index is 27.52 kg/m.      Assesment/PLAN:  Hypertension:  Blood pressure is well controlled although may be low normal Blood pressure was about the same with rheumatologist last month and reportedly around 540 systolic at home No orthostatic lightheadedness or increase in  creatinine recently  Continues to be only on labetalol 100 mg twice daily and Aldactone 50 mg She will continue the same regimen for now May consider stopping labetalol if blood pressure is consistently low normal   Hypokalemia: Controlled with potassium supplementation along with using Aldactone 50 mg daily  Likely has renal potassium wasting of unclear etiology  DIABETES: Well controlled with Metformin 1 g twice daily A1c is excellent at 5.8 Fasting blood glucose readings at home are mostly near normal and 72 in the lab  However since she is just  starting prednisone discussed importance of starting to check readings in the afternoon or evening To call if blood sugars are consistently high  Follow-up in 3 months   Patient Instructions  Check blood sugars on waking up 3 days a week  Also check blood sugars about 2 hours after meals and do this after different meals by rotation  Recommended blood sugar levels on waking up are 90-120 and about 2 hours after meal is 130-160  Please bring your blood sugar monitor to each visit, thank you     Elayne Snare 08/11/2021, 2:00 PM

## 2021-08-25 DIAGNOSIS — M6530 Trigger finger, unspecified finger: Secondary | ICD-10-CM | POA: Diagnosis not present

## 2021-08-25 DIAGNOSIS — E119 Type 2 diabetes mellitus without complications: Secondary | ICD-10-CM | POA: Diagnosis not present

## 2021-08-25 DIAGNOSIS — M549 Dorsalgia, unspecified: Secondary | ICD-10-CM | POA: Diagnosis not present

## 2021-08-25 DIAGNOSIS — M8580 Other specified disorders of bone density and structure, unspecified site: Secondary | ICD-10-CM | POA: Diagnosis not present

## 2021-08-25 DIAGNOSIS — E7800 Pure hypercholesterolemia, unspecified: Secondary | ICD-10-CM | POA: Diagnosis not present

## 2021-08-25 DIAGNOSIS — M0589 Other rheumatoid arthritis with rheumatoid factor of multiple sites: Secondary | ICD-10-CM | POA: Diagnosis not present

## 2021-08-25 DIAGNOSIS — M150 Primary generalized (osteo)arthritis: Secondary | ICD-10-CM | POA: Diagnosis not present

## 2021-08-25 DIAGNOSIS — N289 Disorder of kidney and ureter, unspecified: Secondary | ICD-10-CM | POA: Diagnosis not present

## 2021-08-25 DIAGNOSIS — Z79899 Other long term (current) drug therapy: Secondary | ICD-10-CM | POA: Diagnosis not present

## 2021-09-04 ENCOUNTER — Other Ambulatory Visit: Payer: Self-pay | Admitting: Endocrinology

## 2021-09-04 ENCOUNTER — Other Ambulatory Visit: Payer: Self-pay | Admitting: Cardiovascular Disease

## 2021-09-06 DIAGNOSIS — Z1231 Encounter for screening mammogram for malignant neoplasm of breast: Secondary | ICD-10-CM | POA: Diagnosis not present

## 2021-09-14 ENCOUNTER — Other Ambulatory Visit: Payer: Self-pay | Admitting: Endocrinology

## 2021-09-14 DIAGNOSIS — E119 Type 2 diabetes mellitus without complications: Secondary | ICD-10-CM

## 2021-10-07 DIAGNOSIS — G894 Chronic pain syndrome: Secondary | ICD-10-CM | POA: Diagnosis not present

## 2021-10-07 DIAGNOSIS — Z5181 Encounter for therapeutic drug level monitoring: Secondary | ICD-10-CM | POA: Diagnosis not present

## 2021-10-07 DIAGNOSIS — Z79899 Other long term (current) drug therapy: Secondary | ICD-10-CM | POA: Diagnosis not present

## 2021-10-14 ENCOUNTER — Encounter: Payer: Self-pay | Admitting: Cardiovascular Disease
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
## 2021-10-15 ENCOUNTER — Ambulatory Visit (INDEPENDENT_AMBULATORY_CARE_PROVIDER_SITE_OTHER): Payer: HMO | Admitting: Cardiovascular Disease

## 2021-10-15 ENCOUNTER — Other Ambulatory Visit: Payer: Self-pay

## 2021-10-15 ENCOUNTER — Encounter: Payer: Self-pay | Admitting: Cardiovascular Disease

## 2021-10-15 VITALS — BP 118/58 | HR 62 | Ht 64.0 in | Wt 156.0 lb

## 2021-10-15 DIAGNOSIS — E785 Hyperlipidemia, unspecified: Secondary | ICD-10-CM

## 2021-10-15 DIAGNOSIS — I10 Essential (primary) hypertension: Secondary | ICD-10-CM | POA: Diagnosis not present

## 2021-10-15 LAB — BASIC METABOLIC PANEL
BUN/Creatinine Ratio: 13 (ref 12–28)
BUN: 13 mg/dL (ref 8–27)
CO2: 25 mmol/L (ref 20–29)
Calcium: 9.8 mg/dL (ref 8.7–10.3)
Chloride: 106 mmol/L (ref 96–106)
Creatinine, Ser: 1.03 mg/dL — ABNORMAL HIGH (ref 0.57–1.00)
Glucose: 87 mg/dL (ref 70–99)
Potassium: 4.3 mmol/L (ref 3.5–5.2)
Sodium: 144 mmol/L (ref 134–144)
eGFR: 56 mL/min/{1.73_m2} — ABNORMAL LOW (ref >59)

## 2021-10-15 LAB — LIPID PANEL
Chol/HDL Ratio: 2 ratio (ref 0.0–4.4)
Cholesterol, Total: 162 mg/dL (ref 100–199)
HDL: 82 mg/dL (ref >39)
LDL Chol Calc (NIH): 70 mg/dL (ref 0–99)
Triglycerides: 48 mg/dL (ref 0–149)
VLDL Cholesterol Cal: 10 mg/dL (ref 5–40)

## 2021-10-15 LAB — ALT: ALT: 14 IU/L (ref 0–32)

## 2021-10-15 NOTE — Patient Instructions (Signed)
Medication Instructions:   Your physician has recommended you make the following change in your medication:   ** Spironolactone 50mg  by mouth daily  *If you need a refill on your cardiac medications before your next appointment, please call your pharmacy*   Lab Work:  BMET,ALT and Lipid Panel today  If you have labs (blood work) drawn today and your tests are completely normal, you will receive your results only by: MyChart Message (if you have MyChart) OR A paper copy in the mail If you have any lab test that is abnormal or we need to change your treatment, we will call you to review the results.   Testing/Procedures: None ordered.    Follow-Up: At [facility], you and your health needs are our priority.  As part of our continuing mission to provide you with exceptional heart care, we have created designated Provider Care Teams.  These Care Teams include your primary Cardiologist (physician) and Advanced Practice Providers (APPs -  Physician Assistants and Nurse Practitioners) who all work together to provide you with the care you need, when you need it.  We recommend signing up for the patient portal called "MyChart".  Sign up information is provided on this After Visit Summary.  MyChart is used to connect with patients for Virtual Visits (Telemedicine).  Patients are able to view lab/test results, encounter notes, upcoming appointments, etc.  Non-urgent messages can be sent to your provider as well.   To learn more about what you can do with MyChart, go to [facility].    Your next appointment:   12 month(s)  The format for your next appointment:   In Person  Provider:   Dr [email or web address]  1}

## 2021-10-18 ENCOUNTER — Telehealth: Payer: Self-pay

## 2021-10-18 NOTE — Telephone Encounter (Signed)
Attempted phone call to pt.  Per Epic ok to leave VM.  Pt advised per Dr Elease Hashimoto labs are stable.  Please call [phone] for any questions or concerns.

## 2021-10-18 NOTE — Telephone Encounter (Signed)
-----   Message from Vesta Mixer, MD sent at 10/15/2021  5:52 PM EST ----- Labs are stable

## 2021-11-02 DIAGNOSIS — R059 Cough, unspecified: Secondary | ICD-10-CM | POA: Diagnosis not present

## 2021-11-04 ENCOUNTER — Other Ambulatory Visit: Payer: Self-pay | Admitting: Family Medicine

## 2021-11-04 ENCOUNTER — Ambulatory Visit
Admission: RE | Admit: 2021-11-04 | Discharge: 2021-11-04 | Disposition: A | Discharge status: Home / Self Care | Payer: HMO | Source: Ambulatory Visit | Attending: Family Medicine | Admitting: Family Medicine

## 2021-11-04 DIAGNOSIS — R059 Cough, unspecified: Secondary | ICD-10-CM | POA: Diagnosis not present

## 2021-11-10 DIAGNOSIS — Z79899 Other long term (current) drug therapy: Secondary | ICD-10-CM | POA: Diagnosis not present

## 2021-11-10 DIAGNOSIS — N289 Disorder of kidney and ureter, unspecified: Secondary | ICD-10-CM | POA: Diagnosis not present

## 2021-11-10 DIAGNOSIS — M549 Dorsalgia, unspecified: Secondary | ICD-10-CM | POA: Diagnosis not present

## 2021-11-10 DIAGNOSIS — M779 Enthesopathy, unspecified: Secondary | ICD-10-CM | POA: Diagnosis not present

## 2021-11-10 DIAGNOSIS — M8580 Other specified disorders of bone density and structure, unspecified site: Secondary | ICD-10-CM | POA: Diagnosis not present

## 2021-11-10 DIAGNOSIS — M150 Primary generalized (osteo)arthritis: Secondary | ICD-10-CM | POA: Diagnosis not present

## 2021-11-10 DIAGNOSIS — E119 Type 2 diabetes mellitus without complications: Secondary | ICD-10-CM | POA: Diagnosis not present

## 2021-11-10 DIAGNOSIS — M0589 Other rheumatoid arthritis with rheumatoid factor of multiple sites: Secondary | ICD-10-CM | POA: Diagnosis not present

## 2021-11-10 DIAGNOSIS — E7800 Pure hypercholesterolemia, unspecified: Secondary | ICD-10-CM | POA: Diagnosis not present

## 2021-12-06 ENCOUNTER — Encounter: Payer: Self-pay | Admitting: Hematology

## 2021-12-07 DIAGNOSIS — R059 Cough, unspecified: Secondary | ICD-10-CM | POA: Diagnosis not present

## 2021-12-09 DIAGNOSIS — Z23 Encounter for immunization: Secondary | ICD-10-CM | POA: Diagnosis not present

## 2021-12-09 DIAGNOSIS — Z0000 Encounter for general adult medical examination without abnormal findings: Secondary | ICD-10-CM | POA: Diagnosis not present

## 2021-12-09 DIAGNOSIS — Z1389 Encounter for screening for other disorder: Secondary | ICD-10-CM | POA: Diagnosis not present

## 2021-12-13 ENCOUNTER — Encounter: Payer: Self-pay | Admitting: Hematology

## 2021-12-13 ENCOUNTER — Other Ambulatory Visit: Payer: Self-pay

## 2021-12-13 ENCOUNTER — Other Ambulatory Visit (INDEPENDENT_AMBULATORY_CARE_PROVIDER_SITE_OTHER): Payer: Medicare HMO

## 2021-12-13 DIAGNOSIS — E7800 Pure hypercholesterolemia, unspecified: Secondary | ICD-10-CM

## 2021-12-13 DIAGNOSIS — E119 Type 2 diabetes mellitus without complications: Secondary | ICD-10-CM | POA: Diagnosis not present

## 2021-12-13 LAB — LIPID PANEL
Cholesterol: 155 mg/dL (ref 0–200)
HDL: 77.7 mg/dL (ref >39.00)
LDL Cholesterol: 70 mg/dL (ref 0–99)
NonHDL: 77.61
Total CHOL/HDL Ratio: 2
Triglycerides: 40 mg/dL (ref 0.0–149.0)
VLDL: 8 mg/dL (ref 0.0–40.0)

## 2021-12-13 LAB — COMPREHENSIVE METABOLIC PANEL
ALT: 17 U/L (ref 0–35)
AST: 21 U/L (ref 0–37)
Albumin: 4.4 g/dL (ref 3.5–5.2)
Alkaline Phosphatase: 99 U/L (ref 39–117)
BUN: 25 mg/dL — ABNORMAL HIGH (ref 6–23)
CO2: 24 mEq/L (ref 19–32)
Calcium: 9.4 mg/dL (ref 8.4–10.5)
Chloride: 104 mEq/L (ref 96–112)
Creatinine, Ser: 1.01 mg/dL (ref 0.40–1.20)
GFR: 53.31 mL/min — ABNORMAL LOW (ref >60.00)
Glucose, Bld: 82 mg/dL (ref 70–99)
Potassium: 4.3 mEq/L (ref 3.5–5.1)
Sodium: 138 mEq/L (ref 135–145)
Total Bilirubin: 0.6 mg/dL (ref 0.2–1.2)
Total Protein: 7 g/dL (ref 6.0–8.3)

## 2021-12-13 LAB — HEMOGLOBIN A1C: Hgb A1c MFr Bld: 6 % (ref 4.6–6.5)

## 2021-12-15 ENCOUNTER — Encounter: Payer: Self-pay | Admitting: Endocrinology

## 2021-12-15 ENCOUNTER — Ambulatory Visit (INDEPENDENT_AMBULATORY_CARE_PROVIDER_SITE_OTHER): Payer: Medicare HMO | Admitting: Endocrinology

## 2021-12-15 ENCOUNTER — Other Ambulatory Visit: Payer: Self-pay

## 2021-12-15 VITALS — BP 118/58 | HR 71 | Ht 63.5 in | Wt 156.6 lb

## 2021-12-15 DIAGNOSIS — E119 Type 2 diabetes mellitus without complications: Secondary | ICD-10-CM

## 2021-12-15 DIAGNOSIS — E7800 Pure hypercholesterolemia, unspecified: Secondary | ICD-10-CM

## 2021-12-15 DIAGNOSIS — I10 Essential (primary) hypertension: Secondary | ICD-10-CM | POA: Diagnosis not present

## 2021-12-15 DIAGNOSIS — E876 Hypokalemia: Secondary | ICD-10-CM

## 2021-12-15 NOTE — Patient Instructions (Signed)
Take 1/2 twice daily of Labetalol

## 2021-12-15 NOTE — Progress Notes (Signed)
Patient ID: April [name], female   DOB: 13-Nov-1941, 81 y.o.   MRN: [id number]   Reason for Appointment: follow-up of various problems  History of Present Illness    HYPERTENSION:  diagnosed around 1979 with symptoms of headaches Previously she was taking Avapro, clonidine 0.6 at bedtime and Dyazide but blood pressure was relatively high with this regimen  For evaluation of her hyperaldosteronism she was switched to labetalol 100 mg twice a day, Tenex 2 mg and doxazosin With this regimen her blood pressure was much better She was subsequently started on Aldactone to help with her severe hypokalemia and doxazosin and guanfacine were stopped  CURRENT regimen of Aldactone 50 mg daily, labetalol 100 mg twice a day   Since her renal function was impaired in April 2022 she was told to reduce her Aldactone to half of the 100 mg tablet Previously also on HCTZ  She is checking blood pressure at home with a [employer] meter This appears to be accurate in the office today At home usually blood pressure is in the 120s, may occasionally be around 130    BP Readings from Last 3 Encounters:  12/15/21 (!) 118/58  10/15/21 (!) 118/58  08/11/21 112/70   RENAL function as follows:   Lab Results  Component Value Date   CREATININE 1.01 12/13/2021   CREATININE 1.03 (H) 10/15/2021   CREATININE 1.16 08/03/2021     HYPOKALEMIA:   This previously had been a significant longstanding problem and associated with hypertension  Previously had  been prescribed 6 tablets of potassium daily She was off her diuretics and Avapro when her evaluation for hyperaldosteronism was done, aldosterone level was 6.0 along with a relatively low renin level  She has a normal potassium level consistently with Aldactone, taking 50 mg daily along with 1 tablet of potassium 20 mEq daily  Lab Results  Component Value Date   K 4.3 12/13/2021     DIABETES type II:   She has had diabetes since at  least 2015 This has been  well controlled with a regimen of metformin 1 g twice a day   A1c has been consistently upper normal, now 6 compared to 5.8 previously  She is using a One Touch ultra meter  She takes Metformin 1 g twice a day regularly with meals She has consistently good control with monotherapy  Recent blood sugars range 80s-135, higher in the afternoon  Previously: 87-100   Because of her joint pain she cannot exercise  Generally she is eating healthy meals Weight is stable  Weight history:   Wt Readings from Last 3 Encounters:  12/15/21 156 lb 9.6 oz (71 kg)  10/15/21 156 lb (70.8 kg)  08/11/21 156 lb 9.6 oz (71 kg)      Lab Results  Component Value Date   HGBA1C 6.0 12/13/2021   HGBA1C 5.8 08/03/2021   HGBA1C 5.9 03/15/2021   Lab Results  Component Value Date   MICROALBUR 2.0 (H) 03/15/2021   LDLCALC 70 12/13/2021   CREATININE 1.01 12/13/2021    OTHER active problems discussed today are in review of systems  LABS:  Lab on 12/13/2021  Component Date Value Ref Range Status   Cholesterol 12/13/2021 155  0 - 200 mg/dL Final   ATP III Classification       Desirable:  < 200 mg/dL               Borderline High:  200 - 239 mg/dL  High:  > = 240 mg/dL   Triglycerides 12/13/2021 40.0  0.0 - 149.0 mg/dL Final   Normal:  <150 mg/dLBorderline High:  150 - 199 mg/dL   HDL 12/13/2021 77.70  >39.00 mg/dL Final   VLDL 12/13/2021 8.0  0.0 - 40.0 mg/dL Final   LDL Cholesterol 12/13/2021 70  0 - 99 mg/dL Final   Total CHOL/HDL Ratio 12/13/2021 2   Final                  Men          Women1/2 Average Risk     3.4          3.3Average Risk          5.0          4.42X Average Risk          9.6          7.13X Average Risk          15.0          11.0                       NonHDL 12/13/2021 77.61   Final   NOTE:  Non-HDL goal should be 30 mg/dL higher than patient's LDL goal (i.e. LDL goal of < 70 mg/dL, would have non-HDL goal of < 100 mg/dL)   Sodium  12/13/2021 138  135 - 145 mEq/L Final   Potassium 12/13/2021 4.3  3.5 - 5.1 mEq/L Final   Chloride 12/13/2021 104  96 - 112 mEq/L Final   CO2 12/13/2021 24  19 - 32 mEq/L Final   Glucose, Bld 12/13/2021 82  70 - 99 mg/dL Final   BUN 12/13/2021 25 (H)  6 - 23 mg/dL Final   Creatinine, Ser 12/13/2021 1.01  0.40 - 1.20 mg/dL Final   Total Bilirubin 12/13/2021 0.6  0.2 - 1.2 mg/dL Final   Alkaline Phosphatase 12/13/2021 99  39 - 117 U/L Final   AST 12/13/2021 21  0 - 37 U/L Final   ALT 12/13/2021 17  0 - 35 U/L Final   Total Protein 12/13/2021 7.0  6.0 - 8.3 g/dL Final   Albumin 12/13/2021 4.4  3.5 - 5.2 g/dL Final   GFR 12/13/2021 53.31 (L)  >60.00 mL/min Final   Calculated using the CKD-EPI Creatinine Equation (2021)   Calcium 12/13/2021 9.4  8.4 - 10.5 mg/dL Final   Hgb A1c MFr Bld 12/13/2021 6.0  4.6 - 6.5 % Final   Glycemic Control Guidelines for People with Diabetes:Non Diabetic:  <6%Goal of Therapy: <7%Additional Action Suggested:  >8%     Allergies as of 12/15/2021       Reactions   Acetaminophen Nausea Only   Actos [pioglitazone Hydrochloride]    edema   Aspirin Nausea Only   Atorvastatin Other (See Comments)   Felt drunk/high floating   Morphine Sulfate Other (See Comments)   Burns injecting "arm is on fire"   Naproxen Sodium Nausea Only   Sitagliptin Phosphate Nausea And Vomiting        Medication List        Accurate as of December 15, 2021  8:50 AM. If you have any questions, ask your nurse or doctor.          Co Q 10 100 MG Caps Take by mouth daily.   ezetimibe 10 MG tablet Commonly known as: ZETIA Take 10 mg by mouth daily.   fluticasone 50 MCG/ACT  nasal spray Commonly known as: FLONASE 1 spray in each nostril   folic acid 0.5 MG tablet Commonly known as: FOLVITE Take 0.5 mg by mouth daily.   gabapentin 300 MG capsule Commonly known as: NEURONTIN Take 300 mg by mouth daily.   Ginger 500 MG Caps Take 1 capsule by mouth daily.   labetalol  100 MG tablet Commonly known as: NORMODYNE TAKE 1 TABLET BY MOUTH TWICE A DAY   leflunomide 10 MG tablet Commonly known as: ARAVA Take 10 mg by mouth daily.   metFORMIN 1000 MG tablet Commonly known as: GLUCOPHAGE TAKE 1 TABLET BY MOUTH TWICE A DAY   methotrexate 2.5 MG tablet Commonly known as: RHEUMATREX Take 15 mg by mouth once a week. Patient takes 3 tablets twice once a week to add up to be 6 tablets once weekly, starts with 2 tablets in the evening and takes 2 more tablets in the morning   montelukast 10 MG tablet Commonly known as: SINGULAIR Take 10 mg by mouth daily.   ONE TOUCH ULTRA 2 w/Device Kit Use to check blood sugar once a day DX code I95.1   OneTouch Delica Plus [id number] Misc Use to check blood sugar once a day. Dx code E11.9   OneTouch Ultra test strip Generic drug: glucose blood 1 each by Other route as needed for other. Use as instructed to check blood sugar once a day Dx Code E11.9   potassium chloride SA 20 MEQ tablet Commonly known as: Klor-Con M20 TAKE 1/2 TABLET (10MEQ TOTAL) BY MOUTH ONCE DAILY.   pravastatin 80 MG tablet Commonly known as: PRAVACHOL TAKE 1 TABLET BY MOUTH EVERY DAY   predniSONE 10 MG tablet Commonly known as: DELTASONE Take by mouth.   spironolactone 100 MG tablet Commonly known as: ALDACTONE TAKE 1 TABLET BY MOUTH EVERY DAY What changed: how much to take   tapentadol 50 MG tablet Commonly known as: NUCYNTA Take 50 mg by mouth as needed for severe pain (spasms).   Turmeric 450 MG Caps Take 1 capsule by mouth daily.   vitamin B-12 1000 MCG tablet Commonly known as: CYANOCOBALAMIN Take 1,000 mcg by mouth daily.   ZYRTEC PO 1 tab        Allergies:  Allergies  Allergen Reactions   Acetaminophen Nausea Only   Actos [Pioglitazone Hydrochloride]     edema   Aspirin Nausea Only   Atorvastatin Other (See Comments)    Felt drunk/high floating   Morphine Sulfate Other (See Comments)    Burns injecting "arm is  on fire"   Naproxen Sodium Nausea Only   Sitagliptin Phosphate Nausea And Vomiting    Past Medical History:  Diagnosis Date   Allergic rhinitis    Diabetes mellitus    TYPE 2   Dyslipidemia    Hyperkalemia    Hypertension    Insomnia    Pt stated no trouble falling or staying asleep.   RA (rheumatoid arthritis) (HCC)     Past Surgical History:  Procedure Laterality Date   BREAST CYST EXCISION Right 1967   HAND TENDON SURGERY     Right   OVARIAN CYST SURGERY Right 1988    Family History  Problem Relation Age of Onset   Diabetes Mother    Diabetes Father    Cancer Father        Prostate   Diabetes Sister    Stroke Maternal Grandmother        Cause of death   Healthy Brother  Social History:  reports that she has never smoked. She has never used smokeless tobacco. She reports that she does not drink alcohol and does not use drugs.  Review of Systems:   History of rheumatoid arthritis followed by rheumatologist Recently started taking 10 mg prednisone Also takes gabapentin for pain  Vitamin D deficiency: She is taking vitamin D3, 1000 U daily  LIPIDS: Taking Zetia from PCP and is also on pravastatin 80 mg LDL improved with adding Zetia, last 67   Lab Results  Component Value Date   CHOL 155 12/13/2021   CHOL 162 10/15/2021   CHOL 152 03/15/2021   Lab Results  Component Value Date   HDL 77.70 12/13/2021   HDL 82 10/15/2021   HDL 72.40 03/15/2021   Lab Results  Component Value Date   LDLCALC 70 12/13/2021   LDLCALC 70 10/15/2021   LDLCALC 67 03/15/2021   Lab Results  Component Value Date   TRIG 40.0 12/13/2021   TRIG 48 10/15/2021   TRIG 63.0 03/15/2021   Lab Results  Component Value Date   CHOLHDL 2 12/13/2021   CHOLHDL 2.0 10/15/2021   CHOLHDL 2 03/15/2021   Lab Results  Component Value Date   LDLDIRECT 85.0 10/09/2019    Lab Results  Component Value Date   ALT 17 12/13/2021   ALT 24 01/15/2020   ALT 23 09/11/2017       Examination:   BP (!) 118/58    Pulse 71    Ht 5' 3.5" (1.613 m)    Wt 156 lb 9.6 oz (71 kg)    SpO2 96%    BMI 27.31 kg/m   Body mass index is 27.31 kg/m.      Assesment/PLAN:  Hypertension:  Blood pressure is well controlled although may be low normal again  She is on labetalol 100 mg twice daily and Aldactone 50 mg Again blood pressure is low normal and considering her age we will need to reduce her LABETALOL to half tablet twice a day Home blood pressure cuff is accurate and she can continue to monitor with this periodically  Hypokalemia: Controlled with potassium supplementation along with Aldactone 50 mg daily Had renal insufficiency with higher doses of Aldactone 5  Likely has renal potassium wasting of unclear etiology  DIABETES: Well controlled with Metformin 1 g twice daily A1c is excellent at 6% and consistent  Fasting blood glucose readings at home are mostly near normal and occasional readings after meals are also fairly good She is very consistent with her diet and is able to keep her weight about the same  LIPIDS: Excellent control with pravastatin and Zetia  Follow-up in 3-4 months   There are no Patient Instructions on file for this visit.   Elayne Snare 12/15/2021, 8:50 AM

## 2022-01-05 ENCOUNTER — Other Ambulatory Visit: Payer: Self-pay | Admitting: Endocrinology

## 2022-01-06 ENCOUNTER — Other Ambulatory Visit: Payer: Self-pay

## 2022-01-06 DIAGNOSIS — E119 Type 2 diabetes mellitus without complications: Secondary | ICD-10-CM

## 2022-01-06 MED ORDER — ACCU-CHEK GUIDE W/DEVICE KIT: PACK | 0 refills | Status: AC

## 2022-01-06 MED ORDER — ACCU-CHEK SOFTCLIX LANCETS MISC: 2 refills | Status: DC

## 2022-01-06 MED ORDER — ACCU-CHEK GUIDE VI STRP: ORAL_STRIP | 2 refills | Status: DC

## 2022-01-21 DIAGNOSIS — E119 Type 2 diabetes mellitus without complications: Secondary | ICD-10-CM | POA: Diagnosis not present

## 2022-01-21 DIAGNOSIS — Z0100 Encounter for examination of eyes and vision without abnormal findings: Secondary | ICD-10-CM | POA: Diagnosis not present

## 2022-02-14 ENCOUNTER — Ambulatory Visit: Payer: Self-pay | Admitting: Allergy

## 2022-02-14 IMAGING — DX DG CHEST 2V
2 series · 2 of 2 positions shown · non-contrast
Comparison: August 21, 2020.

CLINICAL DATA: Cough.

EXAM:
CHEST - 2 VIEW

[dg chest 2 view (1 of 2)]
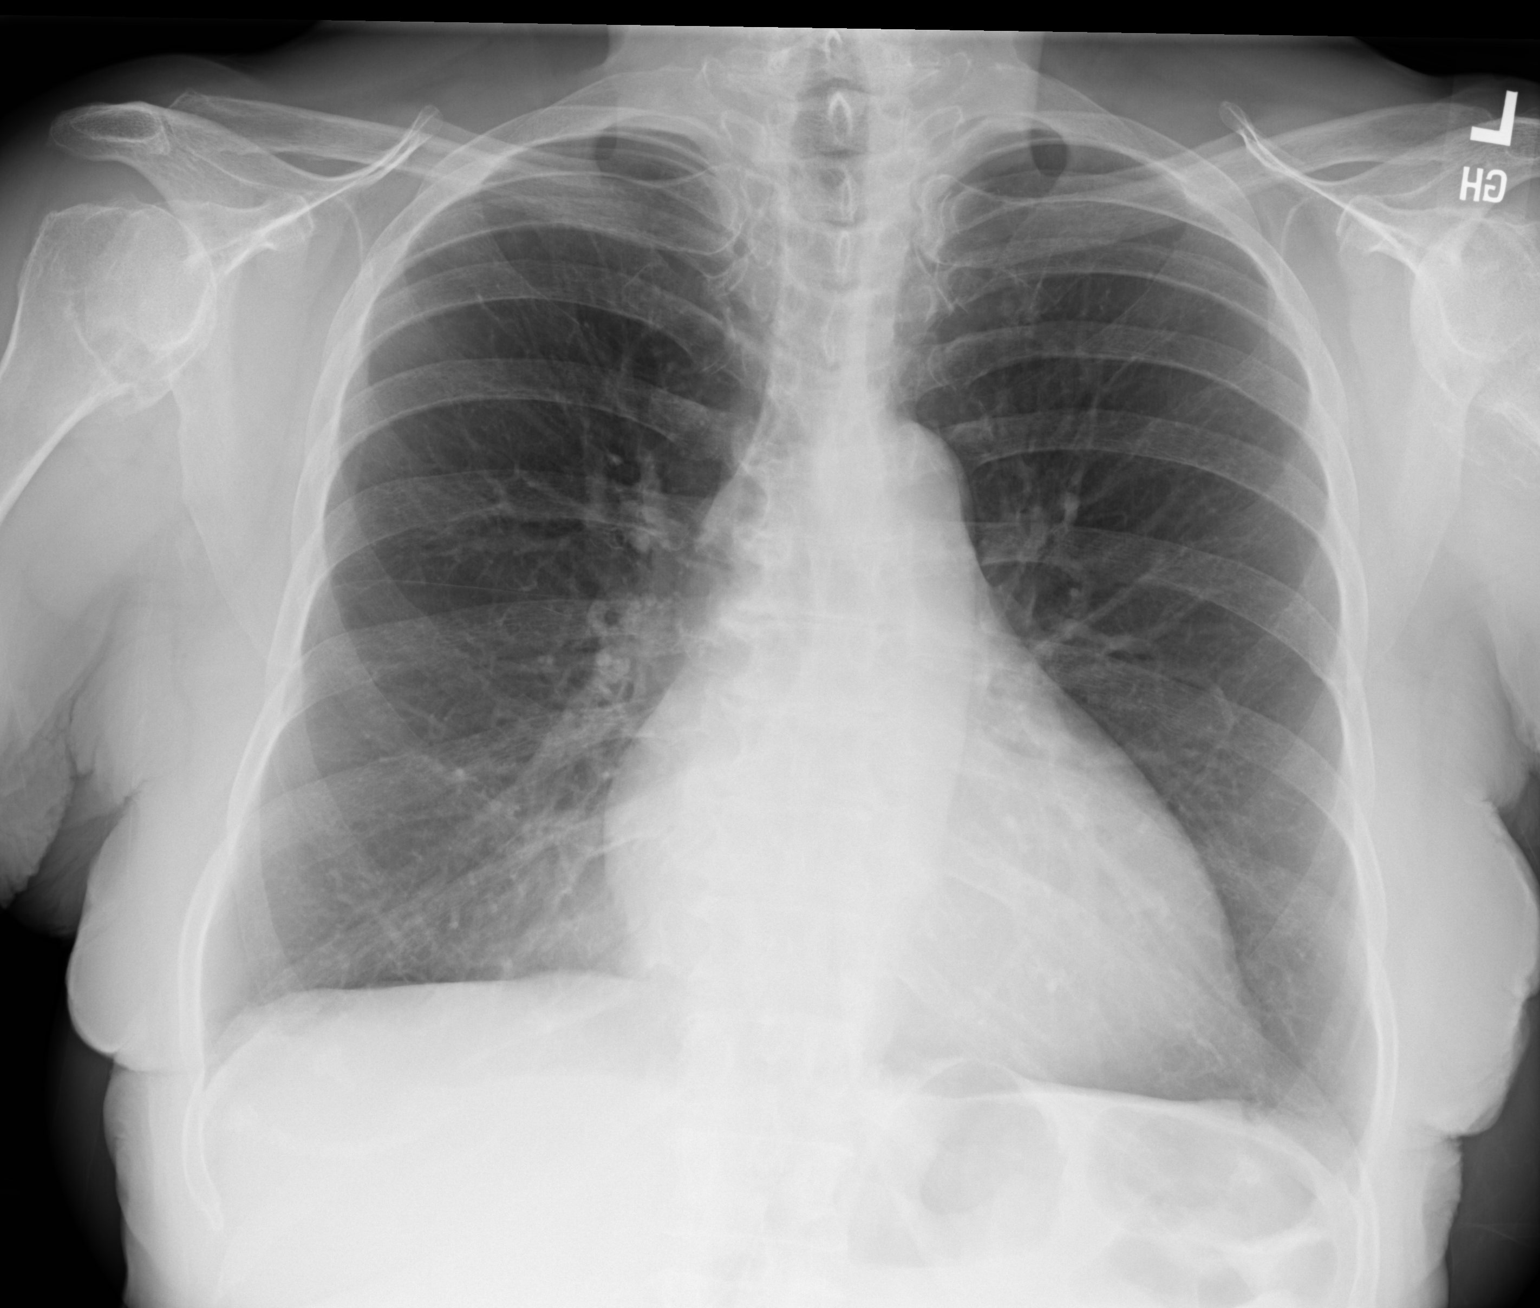

[dg chest 2 view (2 of 2)]
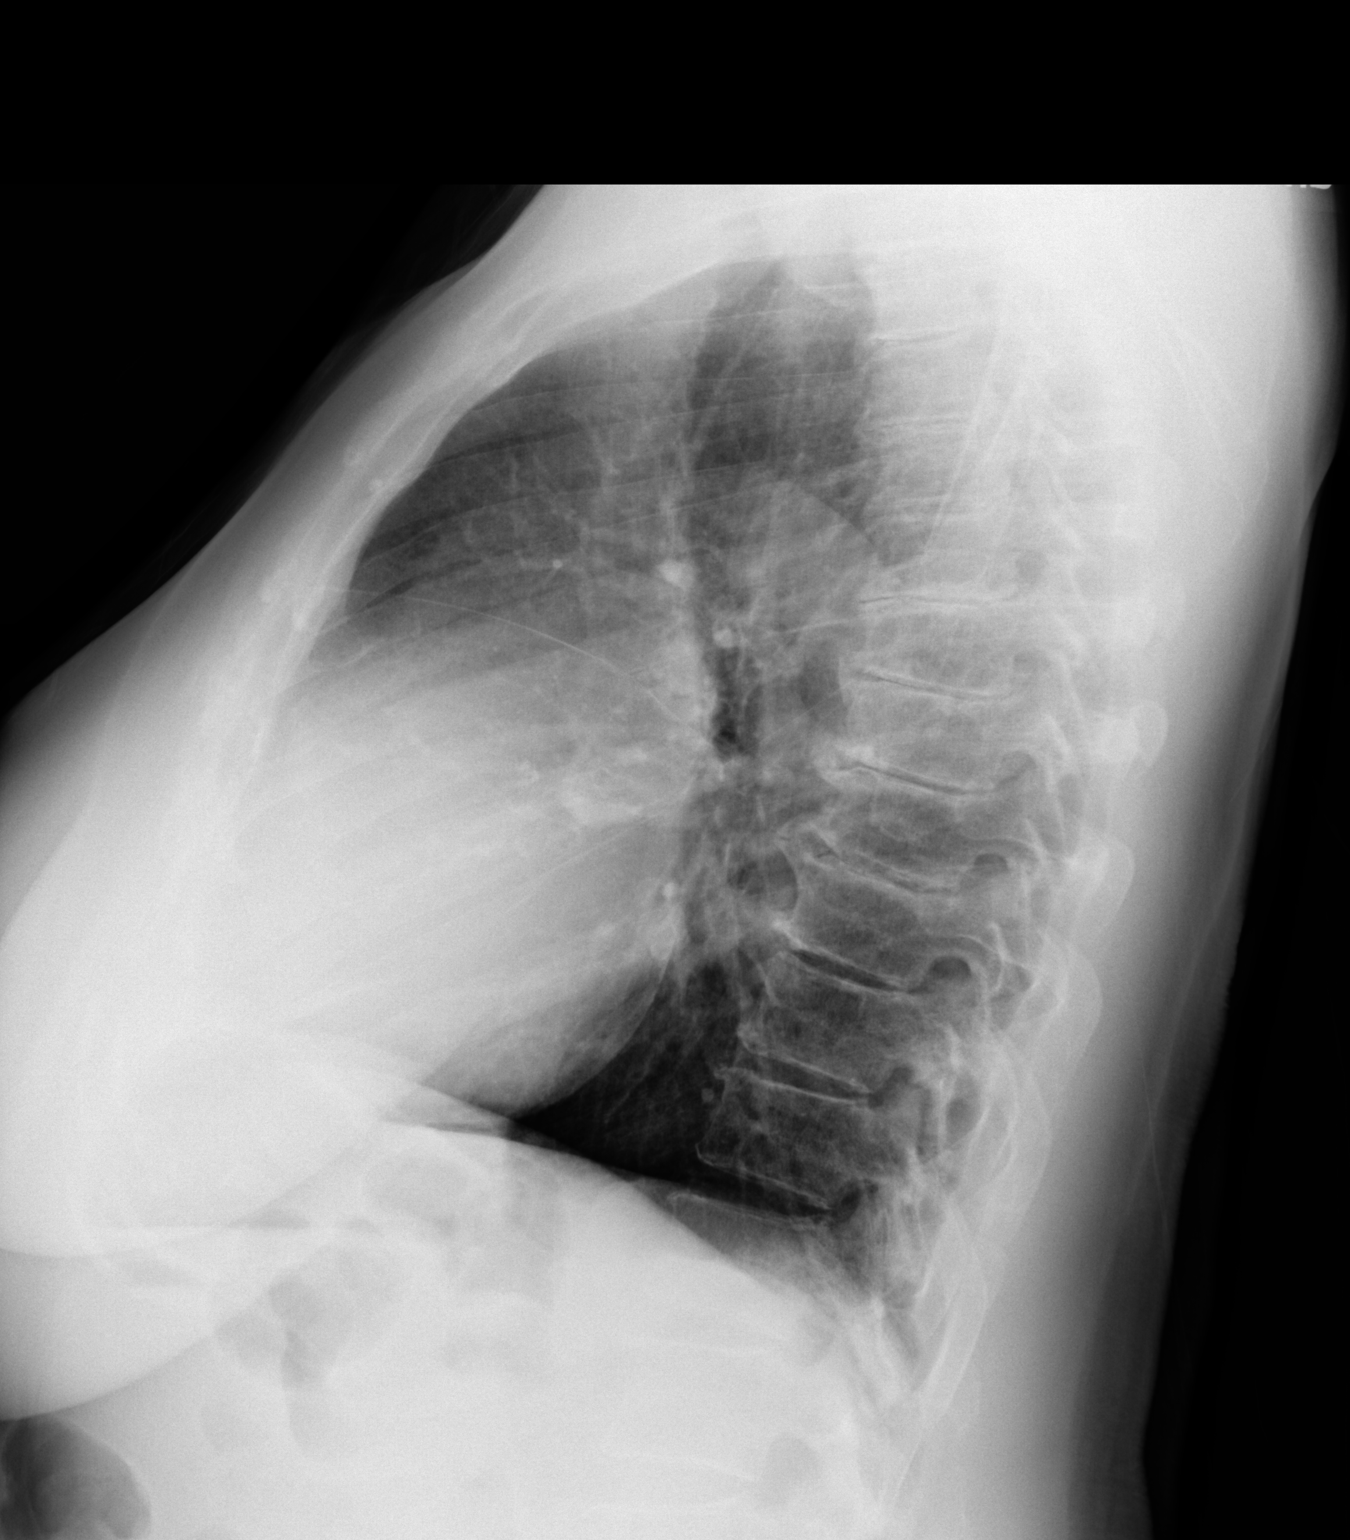

[2 of 2 positions shown; findings below may reference images not displayed]

FINDINGS: The heart size and mediastinal contours are within normal limits.
Both lungs are clear. The visualized skeletal structures are
unremarkable.
IMPRESSION: No active cardiopulmonary disease.

## 2022-02-16 DIAGNOSIS — M47817 Spondylosis without myelopathy or radiculopathy, lumbosacral region: Secondary | ICD-10-CM | POA: Diagnosis not present

## 2022-02-16 DIAGNOSIS — G894 Chronic pain syndrome: Secondary | ICD-10-CM | POA: Diagnosis not present

## 2022-03-09 DIAGNOSIS — M542 Cervicalgia: Secondary | ICD-10-CM | POA: Diagnosis not present

## 2022-03-10 DIAGNOSIS — Z79899 Other long term (current) drug therapy: Secondary | ICD-10-CM | POA: Diagnosis not present

## 2022-03-10 DIAGNOSIS — M0589 Other rheumatoid arthritis with rheumatoid factor of multiple sites: Secondary | ICD-10-CM | POA: Diagnosis not present

## 2022-03-10 DIAGNOSIS — M549 Dorsalgia, unspecified: Secondary | ICD-10-CM | POA: Diagnosis not present

## 2022-03-10 DIAGNOSIS — M25551 Pain in right hip: Secondary | ICD-10-CM | POA: Diagnosis not present

## 2022-03-10 DIAGNOSIS — N289 Disorder of kidney and ureter, unspecified: Secondary | ICD-10-CM | POA: Diagnosis not present

## 2022-03-10 DIAGNOSIS — E1129 Type 2 diabetes mellitus with other diabetic kidney complication: Secondary | ICD-10-CM | POA: Diagnosis not present

## 2022-03-10 DIAGNOSIS — M25511 Pain in right shoulder: Secondary | ICD-10-CM | POA: Diagnosis not present

## 2022-03-10 DIAGNOSIS — M25512 Pain in left shoulder: Secondary | ICD-10-CM | POA: Diagnosis not present

## 2022-03-10 DIAGNOSIS — M150 Primary generalized (osteo)arthritis: Secondary | ICD-10-CM | POA: Diagnosis not present
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
## 2022-03-15 ENCOUNTER — Encounter: Payer: Self-pay | Admitting: Physical Therapy

## 2022-03-15 ENCOUNTER — Ambulatory Visit: Payer: Medicare HMO | Attending: Family Medicine | Admitting: Physical Therapy

## 2022-03-15 ENCOUNTER — Other Ambulatory Visit: Payer: Self-pay

## 2022-03-15 DIAGNOSIS — M62838 Other muscle spasm: Secondary | ICD-10-CM | POA: Insufficient documentation

## 2022-03-15 DIAGNOSIS — M6281 Muscle weakness (generalized): Secondary | ICD-10-CM | POA: Diagnosis not present

## 2022-03-15 DIAGNOSIS — M542 Cervicalgia: Secondary | ICD-10-CM | POA: Diagnosis not present

## 2022-03-22 ENCOUNTER — Ambulatory Visit: Payer: Medicare HMO | Admitting: Physical Therapy

## 2022-03-24 ENCOUNTER — Encounter: Payer: Self-pay | Admitting: Physical Therapy

## 2022-03-24 ENCOUNTER — Ambulatory Visit: Payer: Medicare HMO | Admitting: Physical Therapy

## 2022-03-24 DIAGNOSIS — M62838 Other muscle spasm: Secondary | ICD-10-CM | POA: Diagnosis not present

## 2022-03-24 DIAGNOSIS — M6281 Muscle weakness (generalized): Secondary | ICD-10-CM

## 2022-03-24 DIAGNOSIS — M542 Cervicalgia: Secondary | ICD-10-CM

## 2022-03-24 NOTE — Therapy (Signed)
?OUTPATIENT PHYSICAL THERAPY TREATMENT NOTE ? ? ?Patient Name: April [name] ?MRN: [id number] ?DOB:1941-08-24, 81 y.o., female ?Today's Date: 03/24/2022 ? ?PCP: Sigmund Hazel, MD ?REFERRING PROVIDER: Sigmund Hazel, MD ? ?END OF SESSION:  ? PT End of Session - 03/24/22 0935   ? ? Visit Number 2   ? Number of Visits 13   ? Date for PT Re-Evaluation 05/03/22   ? Authorization Type [employer] MCR   ? PT Start Time [phone]   ? PT Stop Time 1019   ? PT Time Calculation (min) 46 min   ? ?  ?  ? ?  ? ? ?Past Medical History:  ?Diagnosis Date  ? Allergic rhinitis   ? Diabetes mellitus   ? TYPE 2  ? Dyslipidemia   ? Hyperkalemia   ? Hypertension   ? Insomnia   ? Pt stated no trouble falling or staying asleep.  ? RA (rheumatoid arthritis) ([facility])   ? ?Past Surgical History:  ?Procedure Laterality Date  ? BREAST CYST EXCISION Right 1967  ? HAND TENDON SURGERY    ? Right  ? OVARIAN CYST SURGERY Right 1988  ? ?Patient Active Problem List  ? Diagnosis Date Noted  ? Iron deficiency anemia 01/20/2019  ? Encounter for long-term (current) use of other medications 02/29/2012  ? Hypertension   ? Diabetes mellitus   ? Hypokalemia   ? RA (rheumatoid arthritis) ([facility])   ? EDEMA 08/27/2010  ? CARPAL TUNNEL SYNDROME, BILATERAL 10/28/2009  ? NUMBNESS 10/28/2009  ? Diabetes mellitus without complication ([facility]) 09/20/2007  ? Hyperlipidemia 09/19/2007  ? ALLERGIC RHINITIS 09/19/2007  ? INSOMNIA 09/19/2007  ? ? ?PCP: Sigmund Hazel, MD ?  ?REFERRING PROVIDER: Sigmund Hazel, MD ?  ?REFERRING DIAG: Cervicalgia [M54.2] ? ?THERAPY DIAG:  ?Cervicalgia ? ?Muscle weakness (generalized) ? ?PERTINENT HISTORY: DM, HTN  ? ?PRECAUTIONS: None ? ? ?SUBJECTIVE: It is my whole body. Not just my neck. Right now pain is 9/10 because I could not take any medicine.  ? ?PAIN:  ?Are you having pain? Yes: NPRS scale: 9/10 ?Pain location: R shoulder / posterior neck ?Pain description: sore ?Aggravating factors: quick / sudden motions, looking to the R ?Relieving factors: neck ?   ? ? ? ? ? ?OBJECTIVE:  ?*Unless otherwise noted by date, all objective measures were captured on initial evaluation.  ?  ?DIAGNOSTIC FINDINGS:  ?Nothing recent ?  ?PATIENT SURVEYS:  ?FOTO 34%, predicted 54% ?  ?  ?COGNITION: ?Overall cognitive status: Within functional limits for tasks assessed ?  ?  ?SENSATION: ?WFL ?  ?POSTURE:  ?Forward head positioning ?  ?PALPATION: ?TTP along the R SCM, upper trap, levator scapuale, cervical parapsinals with multiple trigger points noted.    ?  ?CERVICAL ROM:  ?  ?Active ROM A/PROM (deg) ?03/15/2022   ?Flexion 30 ERP   ?Extension 28 ERP   ?Right lateral flexion 5   ?Left lateral flexion 5   ?Right rotation 27   ?Left rotation 31   ? (Blank rows = not tested) ?  ?UE ROM: ?  ?Active ROM Right ?03/15/2022 Left ?03/15/2022  ?Shoulder flexion      ?Shoulder extension      ?Shoulder abduction      ?Shoulder adduction      ?Shoulder extension      ?Shoulder internal rotation      ?Shoulder external rotation      ?Elbow flexion      ?Elbow extension      ?Wrist flexion      ?  Wrist extension      ?Wrist ulnar deviation      ?Wrist radial deviation      ?Wrist pronation      ?Wrist supination      ? (Blank rows = not tested) ?  ?UE MMT: ?  ?MMT Right ?03/15/2022 Left ?03/15/2022  ?Shoulder flexion      ?Shoulder extension      ?Shoulder abduction      ?Shoulder adduction      ?Shoulder extension      ?Shoulder internal rotation      ?Shoulder external rotation      ?Middle trapezius      ?Lower trapezius      ?Elbow flexion      ?Elbow extension      ?Wrist flexion      ?Wrist extension      ?Wrist ulnar deviation      ?Wrist radial deviation      ?Wrist pronation      ?Wrist supination      ?Grip strength      ? (Blank rows = not tested) ?  ?CERVICAL SPECIAL TESTS:  ?N/A ?  ?  ?FUNCTIONAL TESTS:  ?N/A ?  ?  ?TODAY'S TREATMENT:  ?  ?[facility] Adult PT Treatment:                                                DATE: 03/24/22 ?Therapeutic Exercise: ?Nustep L1 UE/LE x 5 minutes ?Pulleys 2 minutes ,  flexion ?Seated scap retract ?Seated shoulder rolls ?Upper trap stretch with progressive improvement in ROM  ?Seated chin tuck 5 sec x 10  ?Modalities: HMP trial for 15 minutes ? ? ?[facility] Adult PT Treatment:                                                DATE: 03/15/2022 ?Therapeutic Exercise: ?Seated chin tuck 1 x 10 holding 3 seconds ?R upper trap stretch 2 x 30 sec ?R levator scapulae stretch 1 x 30 sec ?Scapular retraction 1 x 10 holding 3 seconds - demonstration and cues for proper form ?Manual Therapy: ?MTPR along the R upper trap x 2 ?  ?  ?  ?  ?PATIENT EDUCATION:  ?Education details: evaluation findings, POC, goals, HEP with proper form ?Person educated: Patient ?Education method: Explanation, Verbal cues, and Handouts ?Education comprehension: verbalized understanding ?  ?  ?HOME EXERCISE PROGRAM: ?Access Code: [email or web address] ?URL: [email or web address] ?Date: 03/15/2022 ?Prepared by: Lulu Riding ?  ?Exercises ?- Standing Cervical Retraction  - 1 x daily - 7 x weekly - 2 sets - 10 reps ?- Supine Chin Tuck with Towel  - 1 x daily - 7 x weekly - 2 sets - 10 reps - 5 hold ?- Seated Upper Trapezius Stretch  - 1 x daily - 7 x weekly - 2 sets - 2 reps - 30 seconds hold ?- Gentle Levator Scapulae Stretch  - 1 x daily - 7 x weekly - 2 sets - 2 reps - 30 seconds hold ?- Seated Scapular Retraction  - 1 x daily - 7 x weekly - 2 sets - 10 reps - 5 seconds hold ?  ?ASSESSMENT: ?  ?CLINICAL IMPRESSION: ?Pt reports a high level  of pain on arrival. Educated on pain scale with patient reporting 9/10 pain. Reviewed HEP and began general mobility including Nustep and shoulder pulleys. Trial of HMP today for patient education on pain management. Afterward patient reported decreased neck pain.  ? ? ?Evaluation 03/15/22: Patient is a 81 y.o. F who was seen today for physical therapy evaluation and treatment for R sided neck pain that started on 01/17/2022 as a result of getting hit by a cart at [employer] which caused a  whiplash type injury. She demonstrates significant limitation in cervical mobility with pt reported apprehension of pain which further limits mobility. She has functional shoulder ROM but continues to exhibit apprehension of pain, held MMT due to pt report of pain and irritabilty/ severity. TTP noted along the R upper trap, levator scapulae, cervical paraspinals and sub-occipitals. She would benefit from physical therapy to decrease neck pain/ muscle spasm, improve cervical mobility, increase shoulder strength by addressing the deficits listed.  ?  ?  ?OBJECTIVE IMPAIRMENTS decreased activity tolerance, decreased endurance, decreased ROM, decreased strength, increased fascial restrictions, increased muscle spasms, postural dysfunction, and pain.  ?  ?ACTIVITY LIMITATIONS cleaning and community activity.  ?  ?PERSONAL FACTORS Age and 1 comorbidity: DM  are also affecting patient's functional outcome.  ?  ?  ?REHAB POTENTIAL: Good ?  ?CLINICAL DECISION MAKING: Evolving/moderate complexity ?  ?EVALUATION COMPLEXITY: Moderate ?  ?  ?GOALS: ?Goals reviewed with patient? Yes ?  ?SHORT TERM GOALS: Target date: 04/05/2022 ?  ?Pt to be IND with initial HEP for therapeutic progression ?Baseline: no previous HEP ?Goal status: INITIAL ?  ?2.  Reduce muscle spasm in the upper trap and surrounding musculature to promote cervical mobility and reduce max pain to </= 5/10 ?Baseline: current pain 8/10 with increased spasm in the R upper trap ?Goal status: INITIAL ?  ?3.  Increase cervical AROM by >/= 5 degrees in all planes to promote cervical mobility ?Baseline: see table ?Goal status: INITIAL ?  ?LONG TERM GOALS: Target date: 04/26/2022 ?  ?Increase Cervical flexion to >/= 40 degrees, extension to >/= 35 degrees, and increase rotation/ sidebenidng bil to >/= 60 degrees for safety with ADLs and driving with </= 1/612/10 max pain ?Baseline: see table ?Goal status: INITIAL ?  ?2.  Increase bil shoulder strength to >/= 4/5 to promote  stability and lifting required for ADLS ?Baseline: unable to test strength ?Goal status: INITIAL ?  ?3.  Increase FOTO score to >/=54% to demo improvement in function ?Baseline: initial score 34% ?Goal status: I

## 2022-03-25 DIAGNOSIS — J309 Allergic rhinitis, unspecified: Secondary | ICD-10-CM | POA: Diagnosis not present

## 2022-03-25 DIAGNOSIS — E7800 Pure hypercholesterolemia, unspecified: Secondary | ICD-10-CM | POA: Diagnosis not present

## 2022-03-25 DIAGNOSIS — E663 Overweight: Secondary | ICD-10-CM | POA: Diagnosis not present

## 2022-03-25 DIAGNOSIS — Z23 Encounter for immunization: Secondary | ICD-10-CM | POA: Diagnosis not present

## 2022-03-25 DIAGNOSIS — M069 Rheumatoid arthritis, unspecified: Secondary | ICD-10-CM | POA: Diagnosis not present

## 2022-03-29 ENCOUNTER — Ambulatory Visit: Payer: Medicare HMO | Admitting: Physical Therapy

## 2022-03-29 ENCOUNTER — Encounter: Payer: Self-pay | Admitting: Physical Therapy

## 2022-03-29 DIAGNOSIS — M542 Cervicalgia: Secondary | ICD-10-CM

## 2022-03-29 DIAGNOSIS — M6281 Muscle weakness (generalized): Secondary | ICD-10-CM | POA: Diagnosis not present

## 2022-03-29 DIAGNOSIS — M62838 Other muscle spasm: Secondary | ICD-10-CM

## 2022-03-29 NOTE — Therapy (Signed)
?OUTPATIENT PHYSICAL THERAPY TREATMENT NOTE ? ? ?Patient Name: April [name] ?MRN: [id number] ?DOB:1941/01/15, 81 y.o., female ?Today's Date: 03/29/2022 ? ?PCP: Sigmund Hazel, MD ?REFERRING PROVIDER: Sigmund Hazel, MD ? ?END OF SESSION:  ? PT End of Session - 03/29/22 0935   ? ? Visit Number 3   ? Number of Visits 13   ? Date for PT Re-Evaluation 05/03/22   ? Authorization Type [employer] MCR   ? PT Start Time [phone]   ? PT Stop Time 1011   ? PT Time Calculation (min) 38 min   ? ?  ?  ? ?  ? ? ?Past Medical History:  ?Diagnosis Date  ? Allergic rhinitis   ? Diabetes mellitus   ? TYPE 2  ? Dyslipidemia   ? Hyperkalemia   ? Hypertension   ? Insomnia   ? Pt stated no trouble falling or staying asleep.  ? RA (rheumatoid arthritis) (HCC)   ? ?Past Surgical History:  ?Procedure Laterality Date  ? BREAST CYST EXCISION Right 1967  ? HAND TENDON SURGERY    ? Right  ? OVARIAN CYST SURGERY Right 1988  ? ?Patient Active Problem List  ? Diagnosis Date Noted  ? Iron deficiency anemia 01/20/2019  ? Encounter for long-term (current) use of other medications 02/29/2012  ? Hypertension   ? Diabetes mellitus   ? Hypokalemia   ? RA (rheumatoid arthritis) (HCC)   ? EDEMA 08/27/2010  ? CARPAL TUNNEL SYNDROME, BILATERAL 10/28/2009  ? NUMBNESS 10/28/2009  ? Diabetes mellitus without complication (HCC) 09/20/2007  ? Hyperlipidemia 09/19/2007  ? ALLERGIC RHINITIS 09/19/2007  ? INSOMNIA 09/19/2007  ? ? ?PCP: Sigmund Hazel, MD ?  ?REFERRING PROVIDER: Sigmund Hazel, MD ?  ?REFERRING DIAG: Cervicalgia [M54.2] ? ?THERAPY DIAG:  ?Cervicalgia ? ?Muscle weakness (generalized) ? ?Other muscle spasm ? ?PERTINENT HISTORY: DM, HTN  ? ?PRECAUTIONS: None ? ? ?SUBJECTIVE: I was miserable after I left last time. The heat was okay. I put heat on my back l, neck and shoulder again and I did it yesterday. The cold weather makes it hurt. ? ?PAIN:  ?Are you having pain? Yes: NPRS scale: 10/10 ?Pain location: R shoulder / posterior neck ?Pain description:  sore ?Aggravating factors: quick / sudden motions, looking to the R ?Relieving factors: neck ?  ? ? ? ? ? ?OBJECTIVE:  ?*Unless otherwise noted by date, all objective measures were captured on initial evaluation.  ?  ?DIAGNOSTIC FINDINGS:  ?Nothing recent ?  ?PATIENT SURVEYS:  ?FOTO 34%, predicted 54% ?  ?  ?COGNITION: ?Overall cognitive status: Within functional limits for tasks assessed ?  ?  ?SENSATION: ?WFL ?  ?POSTURE:  ?Forward head positioning ?  ?PALPATION: ?TTP along the R SCM, upper trap, levator scapuale, cervical parapsinals with multiple trigger points noted.    ?  ?CERVICAL ROM:  ?  ?Active ROM A/PROM (deg) ?03/15/2022 03/29/22  ?Flexion 30 ERP   ?Extension 28 ERP   ?Right lateral flexion 5   ?Left lateral flexion 5   ?Right rotation 27 40  ?Left rotation 31 40  ? (Blank rows = not tested) ?  ?UE ROM: ?  ?Active ROM Right ?03/15/2022 Left ?03/15/2022  ?Shoulder flexion      ?Shoulder extension      ?Shoulder abduction      ?Shoulder adduction      ?Shoulder extension      ?Shoulder internal rotation      ?Shoulder external rotation      ?Elbow flexion      ?  Elbow extension      ?Wrist flexion      ?Wrist extension      ?Wrist ulnar deviation      ?Wrist radial deviation      ?Wrist pronation      ?Wrist supination      ? (Blank rows = not tested) ?  ?UE MMT: ?  ?MMT Right ?03/15/2022 Left ?03/15/2022  ?Shoulder flexion      ?Shoulder extension      ?Shoulder abduction      ?Shoulder adduction      ?Shoulder extension      ?Shoulder internal rotation      ?Shoulder external rotation      ?Middle trapezius      ?Lower trapezius      ?Elbow flexion      ?Elbow extension      ?Wrist flexion      ?Wrist extension      ?Wrist ulnar deviation      ?Wrist radial deviation      ?Wrist pronation      ?Wrist supination      ?Grip strength      ? (Blank rows = not tested) ?  ?CERVICAL SPECIAL TESTS:  ?N/A ?  ?  ?FUNCTIONAL TESTS:  ?N/A ?  ?  ?TODAY'S TREATMENT:  ?  ?[facility] Adult PT Treatment:                                                 DATE: 03/29/22 ?Therapeutic Exercise: ?Nustep L2 UE/LE x 10 minutes ?Pulleys 2 minutes , flexion ?Cervical rotation AROM 40 bilat  ?Supine with ball under head- cervical rotations, nods x 10 each  ?Supine with pillows -scap retract , chest press, shoulder pullovers ?Declined HMP -will do at home ? ? ?[facility] Adult PT Treatment:                                                DATE: 03/24/22 ?Therapeutic Exercise: ?Nustep L1 UE/LE x 5 minutes ?Pulleys 2 minutes , flexion ?Seated scap retract ?Seated shoulder rolls ?Upper trap stretch with progressive improvement in ROM  ?Seated chin tuck 5 sec x 10  ?Modalities: HMP trial for 15 minutes ? ? ?[facility] Adult PT Treatment:                                                DATE: 03/15/2022 ?Therapeutic Exercise: ?Seated chin tuck 1 x 10 holding 3 seconds ?R upper trap stretch 2 x 30 sec ?R levator scapulae stretch 1 x 30 sec ?Scapular retraction 1 x 10 holding 3 seconds - demonstration and cues for proper form ?Manual Therapy: ?MTPR along the R upper trap x 2 ?  ?  ?  ?  ?PATIENT EDUCATION:  ?Education details: evaluation findings, POC, goals, HEP with proper form ?Person educated: Patient ?Education method: Explanation, Verbal cues, and Handouts ?Education comprehension: verbalized understanding ?  ?  ?HOME EXERCISE PROGRAM: ?Access Code: [email or web address] ?URL: [email or web address] ?Date: 03/15/2022 ?Prepared by: Lulu Riding ?  ?Exercises ?- Standing Cervical Retraction  - 1 x daily -  7 x weekly - 2 sets - 10 reps ?- Supine Chin Tuck with Towel  - 1 x daily - 7 x weekly - 2 sets - 10 reps - 5 hold ?- Seated Upper Trapezius Stretch  - 1 x daily - 7 x weekly - 2 sets - 2 reps - 30 seconds hold ?- Gentle Levator Scapulae Stretch  - 1 x daily - 7 x weekly - 2 sets - 2 reps - 30 seconds hold ?- Seated Scapular Retraction  - 1 x daily - 7 x weekly - 2 sets - 10 reps - 5 seconds hold ?  ?ASSESSMENT: ?  ?CLINICAL IMPRESSION: ?Pt reports increased pain after last  session. Used the opportunity to educate on pain scale as she rates her pain at 10/10 on arrival. Pain rating remained 10/10. Continued with HEP and general mobility including Nustep and shoulder pulleys, supine shoulder ROM. She tolerated session well today without complaints.  ? ? ?Evaluation 03/15/22: Patient is a 81 y.o. F who was seen today for physical therapy evaluation and treatment for R sided neck pain that started on 01/17/2022 as a result of getting hit by a cart at [employer] which caused a whiplash type injury. She demonstrates significant limitation in cervical mobility with pt reported apprehension of pain which further limits mobility. She has functional shoulder ROM but continues to exhibit apprehension of pain, held MMT due to pt report of pain and irritabilty/ severity. TTP noted along the R upper trap, levator scapulae, cervical paraspinals and sub-occipitals. She would benefit from physical therapy to decrease neck pain/ muscle spasm, improve cervical mobility, increase shoulder strength by addressing the deficits listed.  ?  ?  ?OBJECTIVE IMPAIRMENTS decreased activity tolerance, decreased endurance, decreased ROM, decreased strength, increased fascial restrictions, increased muscle spasms, postural dysfunction, and pain.  ?  ?ACTIVITY LIMITATIONS cleaning and community activity.  ?  ?PERSONAL FACTORS Age and 1 comorbidity: DM  are also affecting patient's functional outcome.  ?  ?  ?REHAB POTENTIAL: Good ?  ?CLINICAL DECISION MAKING: Evolving/moderate complexity ?  ?EVALUATION COMPLEXITY: Moderate ?  ?  ?GOALS: ?Goals reviewed with patient? Yes ?  ?SHORT TERM GOALS: Target date: 04/05/2022 ?  ?Pt to be IND with initial HEP for therapeutic progression ?Baseline: no previous HEP ?Goal status: INITIAL ?  ?2.  Reduce muscle spasm in the upper trap and surrounding musculature to promote cervical mobility and reduce max pain to </= 5/10 ?Baseline: current pain 8/10 with increased spasm in the R upper  trap ?Goal status: INITIAL ?  ?3.  Increase cervical AROM by >/= 5 degrees in all planes to promote cervical mobility ?Baseline: see table ?Goal status: INITIAL ?  ?LONG TERM GOALS: Target date: 04/26/2022 ?  ?Phil Dopp

## 2022-03-31 ENCOUNTER — Ambulatory Visit: Payer: Medicare HMO | Admitting: Physical Therapy

## 2022-03-31 ENCOUNTER — Encounter: Payer: Self-pay | Admitting: Physical Therapy

## 2022-03-31 DIAGNOSIS — M62838 Other muscle spasm: Secondary | ICD-10-CM

## 2022-03-31 DIAGNOSIS — M6281 Muscle weakness (generalized): Secondary | ICD-10-CM | POA: Diagnosis not present

## 2022-03-31 DIAGNOSIS — M542 Cervicalgia: Secondary | ICD-10-CM

## 2022-03-31 NOTE — Therapy (Signed)
?OUTPATIENT PHYSICAL THERAPY TREATMENT NOTE ? ? ?Patient Name: April [name] ?MRN: [id number] ?DOB:11/13/1941, 81 y.o., female ?Today's Date: 03/31/2022 ? ?PCP: Sigmund Hazel, MD ?REFERRING PROVIDER: Sigmund Hazel, MD ? ?END OF SESSION:  ? PT End of Session - 03/31/22 5784   ? ? Visit Number 4   ? Number of Visits 13   ? Date for PT Re-Evaluation 05/03/22   ? Authorization Type [employer] MCR   ? PT Start Time 0920   ? PT Stop Time 1005   ? PT Time Calculation (min) 45 min   ? Activity Tolerance Patient tolerated treatment well   ? Behavior During Therapy [facility] for tasks assessed/performed   ? ?  ?  ? ?  ? ? ? ?Past Medical History:  ?Diagnosis Date  ? Allergic rhinitis   ? Diabetes mellitus   ? TYPE 2  ? Dyslipidemia   ? Hyperkalemia   ? Hypertension   ? Insomnia   ? Pt stated no trouble falling or staying asleep.  ? RA (rheumatoid arthritis) (HCC)   ? ?Past Surgical History:  ?Procedure Laterality Date  ? BREAST CYST EXCISION Right 1967  ? HAND TENDON SURGERY    ? Right  ? OVARIAN CYST SURGERY Right 1988  ? ?Patient Active Problem List  ? Diagnosis Date Noted  ? Iron deficiency anemia 01/20/2019  ? Encounter for long-term (current) use of other medications 02/29/2012  ? Hypertension   ? Diabetes mellitus   ? Hypokalemia   ? RA (rheumatoid arthritis) (HCC)   ? EDEMA 08/27/2010  ? CARPAL TUNNEL SYNDROME, BILATERAL 10/28/2009  ? NUMBNESS 10/28/2009  ? Diabetes mellitus without complication (HCC) 09/20/2007  ? Hyperlipidemia 09/19/2007  ? ALLERGIC RHINITIS 09/19/2007  ? INSOMNIA 09/19/2007  ? ? ?PCP: Sigmund Hazel, MD ?  ?REFERRING PROVIDER: Sigmund Hazel, MD ?  ?REFERRING DIAG: Cervicalgia [M54.2] ? ?THERAPY DIAG:  ?Cervicalgia ? ?Muscle weakness (generalized) ? ?Other muscle spasm ? ?PERTINENT HISTORY: DM, HTN  ? ?PRECAUTIONS: None ? ? ?SUBJECTIVE: "I've been doing so-so. Shanda Bumps worked on my neck that first day I was pretty sore but she did inform me about the heat and it calmed it down. I need to be careful with moving  the wrong way." ? ?PAIN:  ?Are you having pain? Yes: NPRS scale: 5/10 ?Pain location: R shoulder / posterior neck ?Pain description: sore ?Aggravating factors: quick / sudden motions, looking to the R ?Relieving factors: neck ?  ? ? ? ? ? ?OBJECTIVE:  ?*Unless otherwise noted by date, all objective measures were captured on initial evaluation.  ?  ?DIAGNOSTIC FINDINGS:  ?Nothing recent ?  ?PATIENT SURVEYS:  ?FOTO 34%, predicted 54% ?  ?  ?COGNITION: ?Overall cognitive status: Within functional limits for tasks assessed ?  ?  ?SENSATION: ?WFL ?  ?POSTURE:  ?Forward head positioning ?  ?PALPATION: ?TTP along the R SCM, upper trap, levator scapuale, cervical parapsinals with multiple trigger points noted.    ?  ?CERVICAL ROM:  ?  ?Active ROM A/PROM (deg) ?03/15/2022 03/29/22  ?Flexion 30 ERP   ?Extension 28 ERP   ?Right lateral flexion 5   ?Left lateral flexion 5   ?Right rotation 27 40  ?Left rotation 31 40  ? (Blank rows = not tested) ?  ?UE ROM: ?  ?Active ROM Right ?03/15/2022 Left ?03/15/2022  ?Shoulder flexion      ?Shoulder extension      ?Shoulder abduction      ?Shoulder adduction      ?Shoulder extension      ?  Shoulder internal rotation      ?Shoulder external rotation      ?Elbow flexion      ?Elbow extension      ?Wrist flexion      ?Wrist extension      ?Wrist ulnar deviation      ?Wrist radial deviation      ?Wrist pronation      ?Wrist supination      ? (Blank rows = not tested) ?  ?UE MMT: ?  ?MMT Right ?03/15/2022 Left ?03/15/2022  ?Shoulder flexion      ?Shoulder extension      ?Shoulder abduction      ?Shoulder adduction      ?Shoulder extension      ?Shoulder internal rotation      ?Shoulder external rotation      ?Middle trapezius      ?Lower trapezius      ?Elbow flexion      ?Elbow extension      ?Wrist flexion      ?Wrist extension      ?Wrist ulnar deviation      ?Wrist radial deviation      ?Wrist pronation      ?Wrist supination      ?Grip strength      ? (Blank rows = not tested) ?  ?CERVICAL  SPECIAL TESTS:  ?N/A ?  ?  ?FUNCTIONAL TESTS:  ?N/A ?  ?  ?TODAY'S TREATMENT:  ?  ?[facility] Adult PT Treatment:                                                DATE:03/31/2022 ?Therapeutic Exercise: ?Nu-step L5 x 5 min UE/LE ?Supine chin tuck 1 x 10 holding 5 seconds (while laying on tennis balls to promote tack and stretch) ?Seated thoracic rotation 2 x 10 with arms crossed - verbal cues to keep head in the middle ?Upper trap stretch 2 x 30 sec  ?Double ER with scapular retraction 2 x 10 with RTB ?Manual Therapy: ?R upper trap / levator scapulae MTPR with gradual graded increase in pressure ?IASTM along the R upper trap/ levator  ?Sub-occipital release using tennis balls ?Self Care: ?Reviewed sleeping posture and position to reduce abnormal tension on the neck/ shoulders. Proper use of pillows to keep her head in neural position.  ? ?[facility] Adult PT Treatment:                                                DATE: 03/29/22 ?Therapeutic Exercise: ?Nustep L2 UE/LE x 10 minutes ?Pulleys 2 minutes , flexion ?Cervical rotation AROM 40 bilat  ?Supine with ball under head- cervical rotations, nods x 10 each  ?Supine with pillows -scap retract , chest press, shoulder pullovers ?Declined HMP -will do at home ? ? ?[facility] Adult PT Treatment:                                                DATE: 03/24/22 ?Therapeutic Exercise: ?Nustep L1 UE/LE x 5 minutes ?Pulleys 2 minutes , flexion ?Seated scap retract ?Seated shoulder rolls ?Upper trap  stretch with progressive improvement in ROM  ?Seated chin tuck 5 sec x 10  ?Modalities: HMP trial for 15 minutes ? ?  ?PATIENT EDUCATION:  ?Education details: evaluation findings, POC, goals, HEP with proper form ?Person educated: Patient ?Education method: Explanation, Verbal cues, and Handouts ?Education comprehension: verbalized understanding ?  ?  ?HOME EXERCISE PROGRAM: ?Access Code: [email or web address] ?URL: [email or web address] ?Date: 03/31/2022 ?Prepared by: Lulu RidingKristoffer Nicolena Schurman ? ?Exercises ?-  Standing Cervical Retraction  - 1 x daily - 7 x weekly - 2 sets - 10 reps ?- Supine Chin Tuck with Towel  - 1 x daily - 7 x weekly - 2 sets - 10 reps - 5 hold ?- Seated Upper Trapezius Stretch  - 1 x daily - 7 x weekly - 2 sets - 2 reps - 30 seconds hold ?- Gentle Levator Scapulae Stretch  - 1 x daily - 7 x weekly - 2 sets - 2 reps - 30 seconds hold ?- Seated Scapular Retraction  - 1 x daily - 7 x weekly - 2 sets - 10 reps - 5 seconds hold ?- Seated Thoracic Rotation  - 1 x daily - 7 x weekly - 2 sets - 10 reps - 2 hold ?ASSESSMENT: ?  ?CLINICAL IMPRESSION: ?Pt arrives to PT noting pain was at a 5/10. Continued working on STW focusing on the R upper trap / levator scapulae utilized a gradual increase in pressure due to pt's apprehension of pain. Continued working on posture education to help with cervical / head positioning when sleeping. She did well with strengthening and reported the pain didn't worsen compared to when she arrived.  ?  ?  ?OBJECTIVE IMPAIRMENTS decreased activity tolerance, decreased endurance, decreased ROM, decreased strength, increased fascial restrictions, increased muscle spasms, postural dysfunction, and pain.  ?  ?ACTIVITY LIMITATIONS cleaning and community activity.  ?  ?PERSONAL FACTORS Age and 1 comorbidity: DM  are also affecting patient's functional outcome.  ?  ?  ?REHAB POTENTIAL: Good ?  ?CLINICAL DECISION MAKING: Evolving/moderate complexity ?  ?EVALUATION COMPLEXITY: Moderate ?  ?  ?GOALS: ?Goals reviewed with patient? Yes ?  ?SHORT TERM GOALS: Target date: 04/05/2022 ?  ?Pt to be IND with initial HEP for therapeutic progression ?Baseline: no previous HEP ?Goal status: INITIAL ?  ?2.  Reduce muscle spasm in the upper trap and surrounding musculature to promote cervical mobility and reduce max pain to </= 5/10 ?Baseline: current pain 8/10 with increased spasm in the R upper trap ?Goal status: INITIAL ?  ?3.  Increase cervical AROM by >/= 5 degrees in all planes to promote cervical  mobility ?Baseline: see table ?Goal status: INITIAL ?  ?LONG TERM GOALS: Target date: 04/26/2022 ?  ?Increase Cervical flexion to >/= 40 degrees, extension to >/= 35 degrees, and increase rotation/ sidebenidn

## 2022-04-03 ENCOUNTER — Other Ambulatory Visit: Payer: Self-pay | Admitting: Internal Medicine

## 2022-04-05 ENCOUNTER — Encounter: Payer: Self-pay | Admitting: Physical Therapy

## 2022-04-05 ENCOUNTER — Ambulatory Visit: Payer: Medicare HMO | Attending: Family Medicine | Admitting: Physical Therapy

## 2022-04-05 DIAGNOSIS — M62838 Other muscle spasm: Secondary | ICD-10-CM | POA: Diagnosis not present

## 2022-04-05 DIAGNOSIS — R2689 Other abnormalities of gait and mobility: Secondary | ICD-10-CM | POA: Diagnosis not present

## 2022-04-05 DIAGNOSIS — M542 Cervicalgia: Secondary | ICD-10-CM | POA: Insufficient documentation

## 2022-04-05 DIAGNOSIS — M6281 Muscle weakness (generalized): Secondary | ICD-10-CM | POA: Insufficient documentation

## 2022-04-05 NOTE — Therapy (Signed)
?OUTPATIENT PHYSICAL THERAPY TREATMENT NOTE ? ? ?Patient Name: April [name] ?MRN: [id number] ?DOB:1941/11/21, 81 y.o., female ?Today's Date: 04/05/2022 ? ?PCP: Kathyrn Lass, MD ?REFERRING PROVIDER: Kathyrn Lass, MD ? ?END OF SESSION:  ? PT End of Session - 04/05/22 9563   ? ? Visit Number 5   ? Number of Visits 13   ? Date for PT Re-Evaluation 05/03/22   ? Authorization Type [employer] MCR   ? PT Start Time [phone]   ? PT Stop Time 1015   ? PT Time Calculation (min) 44 min   ? ?  ?  ? ?  ? ? ? ?Past Medical History:  ?Diagnosis Date  ? Allergic rhinitis   ? Diabetes mellitus   ? TYPE 2  ? Dyslipidemia   ? Hyperkalemia   ? Hypertension   ? Insomnia   ? Pt stated no trouble falling or staying asleep.  ? RA (rheumatoid arthritis) ([place])   ? ?Past Surgical History:  ?Procedure Laterality Date  ? BREAST CYST EXCISION Right 1967  ? HAND TENDON SURGERY    ? Right  ? OVARIAN CYST SURGERY Right 1988  ? ?Patient Active Problem List  ? Diagnosis Date Noted  ? Iron deficiency anemia 01/20/2019  ? Encounter for long-term (current) use of other medications 02/29/2012  ? Hypertension   ? Diabetes mellitus   ? Hypokalemia   ? RA (rheumatoid arthritis) ([place])   ? EDEMA 08/27/2010  ? CARPAL TUNNEL SYNDROME, BILATERAL 10/28/2009  ? NUMBNESS 10/28/2009  ? Diabetes mellitus without complication ([place]) 43/32/9518  ? Hyperlipidemia 09/19/2007  ? ALLERGIC RHINITIS 09/19/2007  ? INSOMNIA 09/19/2007  ? ? ?PCP: Kathyrn Lass, MD ?  ?REFERRING PROVIDER: Kathyrn Lass, MD ?  ?REFERRING DIAG: Cervicalgia [M54.2] ? ?THERAPY DIAG:  ?Cervicalgia ? ?Muscle weakness (generalized) ? ?Other muscle spasm ? ?PERTINENT HISTORY: DM, HTN  ? ?PRECAUTIONS: None ? ? ?SUBJECTIVE: "I've been doing so-so. Janett Billow worked on my neck that first day I was pretty sore but she did inform me about the heat and it calmed it down. I need to be careful with moving the wrong way." ? ?PAIN:  ?Are you having pain? Yes: NPRS scale: 5/10 ?Pain location: R shoulder / posterior  neck ?Pain description: sore ?Aggravating factors: quick / sudden motions, looking to the R ?Relieving factors: neck ?  ? ? ? ? ? ?OBJECTIVE:  ?*Unless otherwise noted by date, all objective measures were captured on initial evaluation.  ?  ?DIAGNOSTIC FINDINGS:  ?Nothing recent ?  ?PATIENT SURVEYS:  ?FOTO 34%, predicted 54% ?  ?  ?COGNITION: ?Overall cognitive status: Within functional limits for tasks assessed ?  ?  ?SENSATION: ?WFL ?  ?POSTURE:  ?Forward head positioning ?  ?PALPATION: ?TTP along the R SCM, upper trap, levator scapuale, cervical parapsinals with multiple trigger points noted.    ?  ?CERVICAL ROM:  ?  ?Active ROM A/PROM (deg) ?03/15/2022 03/29/22  ?Flexion 30 ERP   ?Extension 28 ERP   ?Right lateral flexion 5   ?Left lateral flexion 5   ?Right rotation 27 40  ?Left rotation 31 40  ? (Blank rows = not tested) ?  ?UE ROM: ?  ?Active ROM Right ?03/15/2022 Left ?03/15/2022  ?Shoulder flexion      ?Shoulder extension      ?Shoulder abduction      ?Shoulder adduction      ?Shoulder extension      ?Shoulder internal rotation      ?Shoulder external rotation      ?  Elbow flexion      ?Elbow extension      ?Wrist flexion      ?Wrist extension      ?Wrist ulnar deviation      ?Wrist radial deviation      ?Wrist pronation      ?Wrist supination      ? (Blank rows = not tested) ?  ?UE MMT: ?  ?MMT Right ?03/15/2022 Left ?03/15/2022  ?Shoulder flexion      ?Shoulder extension      ?Shoulder abduction      ?Shoulder adduction      ?Shoulder extension      ?Shoulder internal rotation      ?Shoulder external rotation      ?Middle trapezius      ?Lower trapezius      ?Elbow flexion      ?Elbow extension      ?Wrist flexion      ?Wrist extension      ?Wrist ulnar deviation      ?Wrist radial deviation      ?Wrist pronation      ?Wrist supination      ?Grip strength      ? (Blank rows = not tested) ?  ?CERVICAL SPECIAL TESTS:  ?N/A ?  ?  ?FUNCTIONAL TESTS:  ?N/A ?  ?  ?TODAY'S TREATMENT:  ?  ?[facility] Adult PT Treatment:                                                 DATE:04/05/2022 ?Therapeutic Exercise: ?Nu-step L5 x 10 min UE/LE ?Double ER with scapular retraction 2 x 10 with RTB ?Shoulder rolls ?UT stretches 10 x 2 ?Seated thoracic rotation 2 x 10 with arms crossed - verbal cues to keep head in the middle ?Supine chin tuck 1 x 10 holding 5 seconds (while PTA providing sub occipital pressure to promote tack and stretch) ?Supine chin tuck with cervical rotaiton ?Supine chin tuck x 10, with AROM horizontal abduction x 10, with AROM alternating UE Flexion/extension x 10 ? ?Manual Therapy: ? ?Sub-occipital release  ?Modalities:  ?HMP x 15 min cervical ? ?[facility] Adult PT Treatment:                                                DATE:03/31/2022 ?Therapeutic Exercise: ?Nu-step L5 x 5 min UE/LE ?Supine chin tuck 1 x 10 holding 5 seconds (while laying on tennis balls to promote tack and stretch) ?Seated thoracic rotation 2 x 10 with arms crossed - verbal cues to keep head in the middle ?Upper trap stretch 2 x 30 sec  ?Double ER with scapular retraction 2 x 10 with RTB ?Manual Therapy: ?R upper trap / levator scapulae MTPR with gradual graded increase in pressure ?IASTM along the R upper trap/ levator  ?Sub-occipital release using tennis balls ?Self Care: ?Reviewed sleeping posture and position to reduce abnormal tension on the neck/ shoulders. Proper use of pillows to keep her head in neural position.  ? ?[facility] Adult PT Treatment:  DATE: 03/29/22 ?Therapeutic Exercise: ?Nustep L2 UE/LE x 10 minutes ?Pulleys 2 minutes , flexion ?Cervical rotation AROM 40 bilat  ?Supine with ball under head- cervical rotations, nods x 10 each  ?Supine with pillows -scap retract , chest press, shoulder pullovers ?Declined HMP -will do at home ? ? ?[facility] Adult PT Treatment:                                                DATE: 03/24/22 ?Therapeutic Exercise: ?Nustep L1 UE/LE x 5 minutes ?Pulleys 2 minutes , flexion ?Seated scap  retract ?Seated shoulder rolls ?Upper trap stretch with progressive improvement in ROM  ?Seated chin tuck 5 sec x 10  ?Modalities: HMP trial for 15 minutes ? ?  ?PATIENT EDUCATION:  ?Education details: evaluation findings, POC, goals, HEP with proper form ?Person educated: Patient ?Education method: Explanation, Verbal cues, and Handouts ?Education comprehension: verbalized understanding ?  ?  ?HOME EXERCISE PROGRAM: ?Access Code: [email or web address] ?URL: [email or web address] ?Date: 03/31/2022 ?Prepared by: Starr Lake ? ?Exercises ?- Standing Cervical Retraction  - 1 x daily - 7 x weekly - 2 sets - 10 reps ?- Supine Chin Tuck with Towel  - 1 x daily - 7 x weekly - 2 sets - 10 reps - 5 hold ?- Seated Upper Trapezius Stretch  - 1 x daily - 7 x weekly - 2 sets - 2 reps - 30 seconds hold ?- Gentle Levator Scapulae Stretch  - 1 x daily - 7 x weekly - 2 sets - 2 reps - 30 seconds hold ?- Seated Scapular Retraction  - 1 x daily - 7 x weekly - 2 sets - 10 reps - 5 seconds hold ?- Seated Thoracic Rotation  - 1 x daily - 7 x weekly - 2 sets - 10 reps - 2 hold ?ASSESSMENT: ?  ?CLINICAL IMPRESSION: ?Pt arrives to PT noting pain was at a 5/10. She reports colder weather can make pain increase. Continued with mobility and stretching with sub occipital release. She tolerated the session with improved comfort. She did well with strengthening and reported the pain didn't worsen compared to when she arrived. HMP applied post session.  ?  ?  ?OBJECTIVE IMPAIRMENTS decreased activity tolerance, decreased endurance, decreased ROM, decreased strength, increased fascial restrictions, increased muscle spasms, postural dysfunction, and pain.  ?  ?ACTIVITY LIMITATIONS cleaning and community activity.  ?  ?PERSONAL FACTORS Age and 1 comorbidity: DM  are also affecting patient's functional outcome.  ?  ?  ?REHAB POTENTIAL: Good ?  ?CLINICAL DECISION MAKING: Evolving/moderate complexity ?  ?EVALUATION COMPLEXITY: Moderate ?  ?   ?GOALS: ?Goals reviewed with patient? Yes ?  ?SHORT TERM GOALS: Target date: 04/05/2022 ?  ?Pt to be IND with initial HEP for therapeutic progression ?Baseline: no previous HEP ?Goal status: MET 04/05/22 ?  ?2.  Red

## 2022-04-07 ENCOUNTER — Encounter: Payer: Self-pay | Admitting: Physical Therapy

## 2022-04-07 ENCOUNTER — Ambulatory Visit: Payer: Medicare HMO | Admitting: Physical Therapy

## 2022-04-07 DIAGNOSIS — M6281 Muscle weakness (generalized): Secondary | ICD-10-CM | POA: Diagnosis not present

## 2022-04-07 DIAGNOSIS — R2689 Other abnormalities of gait and mobility: Secondary | ICD-10-CM | POA: Diagnosis not present

## 2022-04-07 DIAGNOSIS — M542 Cervicalgia: Secondary | ICD-10-CM | POA: Diagnosis not present

## 2022-04-07 DIAGNOSIS — M62838 Other muscle spasm: Secondary | ICD-10-CM

## 2022-04-07 NOTE — Therapy (Signed)
?OUTPATIENT PHYSICAL THERAPY TREATMENT NOTE ? ? ?Patient Name: April [name] ?MRN: [id number] ?DOB:1941/10/12, 81 y.o., female ?Today's Date: 04/07/2022 ? ?PCP: Sigmund HazelMiller, Lisa, MD ?REFERRING PROVIDER: Sigmund HazelMiller, Lisa, MD ? ?END OF SESSION:  ? PT End of Session - 04/07/22 0936   ? ? Visit Number 6   ? Number of Visits 13   ? Date for PT Re-Evaluation 05/03/22   ? Authorization Type [employer] MCR   ? Authorization Time Period 03/15/22-04/29/22; 12 visits   ? Authorization - Visit Number 6   ? Authorization - Number of Visits 12   ? PT Start Time [phone]   ? PT Stop Time 1028   ? PT Time Calculation (min) 54 min   ? ?  ?  ? ?  ? ? ? ?Past Medical History:  ?Diagnosis Date  ? Allergic rhinitis   ? Diabetes mellitus   ? TYPE 2  ? Dyslipidemia   ? Hyperkalemia   ? Hypertension   ? Insomnia   ? Pt stated no trouble falling or staying asleep.  ? RA (rheumatoid arthritis) ([facility])   ? ?Past Surgical History:  ?Procedure Laterality Date  ? BREAST CYST EXCISION Right 1967  ? HAND TENDON SURGERY    ? Right  ? OVARIAN CYST SURGERY Right 1988  ? ?Patient Active Problem List  ? Diagnosis Date Noted  ? Iron deficiency anemia 01/20/2019  ? Encounter for long-term (current) use of other medications 02/29/2012  ? Hypertension   ? Diabetes mellitus   ? Hypokalemia   ? RA (rheumatoid arthritis) ([facility])   ? EDEMA 08/27/2010  ? CARPAL TUNNEL SYNDROME, BILATERAL 10/28/2009  ? NUMBNESS 10/28/2009  ? Diabetes mellitus without complication ([facility]) 09/20/2007  ? Hyperlipidemia 09/19/2007  ? ALLERGIC RHINITIS 09/19/2007  ? INSOMNIA 09/19/2007  ? ? ?PCP: Sigmund HazelMiller, Lisa, MD ?  ?REFERRING PROVIDER: Sigmund HazelMiller, Lisa, MD ?  ?REFERRING DIAG: Cervicalgia [M54.2] ? ?THERAPY DIAG:  ?Cervicalgia ? ?Muscle weakness (generalized) ? ?Other muscle spasm ? ?PERTINENT HISTORY: DM, HTN  ? ?PRECAUTIONS: None ? ? ?SUBJECTIVE: My neck is much better but I am feeling it in my shoulders today.  ? ?PAIN:  ?Are you having pain? Yes: NPRS scale: 6/10 ?Pain location: bilat upper traps to  shoulders ?Pain description: sore ?Aggravating factors: quick / sudden motions, looking to the R ?Relieving factors: neck ?  ? ? ? ? ? ?OBJECTIVE:  ?*Unless otherwise noted by date, all objective measures were captured on initial evaluation.  ?  ?DIAGNOSTIC FINDINGS:  ?Nothing recent ?  ?PATIENT SURVEYS:  ?FOTO 34%, predicted 54% ?  ?  ?COGNITION: ?Overall cognitive status: Within functional limits for tasks assessed ?  ?  ?SENSATION: ?WFL ?  ?POSTURE:  ?Forward head positioning ?  ?PALPATION: ?TTP along the R SCM, upper trap, levator scapuale, cervical parapsinals with multiple trigger points noted.    ?  ?CERVICAL ROM:  ?  ?Active ROM A/PROM (deg) ?03/15/2022 03/29/22  ?Flexion 30 ERP   ?Extension 28 ERP   ?Right lateral flexion 5   ?Left lateral flexion 5   ?Right rotation 27 40  ?Left rotation 31 40  ? (Blank rows = not tested) ?  ?UE ROM: ?  ?Active ROM Right ?03/15/2022 Left ?03/15/2022  ?Shoulder flexion      ?Shoulder extension      ?Shoulder abduction      ?Shoulder adduction      ?Shoulder extension      ?Shoulder internal rotation      ?Shoulder external rotation      ?  Elbow flexion      ?Elbow extension      ?Wrist flexion      ?Wrist extension      ?Wrist ulnar deviation      ?Wrist radial deviation      ?Wrist pronation      ?Wrist supination      ? (Blank rows = not tested) ?  ?UE MMT: ?  ?MMT Right ?03/15/2022 Left ?03/15/2022  ?Shoulder flexion      ?Shoulder extension      ?Shoulder abduction      ?Shoulder adduction      ?Shoulder extension      ?Shoulder internal rotation      ?Shoulder external rotation      ?Middle trapezius      ?Lower trapezius      ?Elbow flexion      ?Elbow extension      ?Wrist flexion      ?Wrist extension      ?Wrist ulnar deviation      ?Wrist radial deviation      ?Wrist pronation      ?Wrist supination      ?Grip strength      ? (Blank rows = not tested) ?  ?CERVICAL SPECIAL TESTS:  ?N/A ?  ?  ?FUNCTIONAL TESTS:  ?N/A ?  ?  ?TODAY'S TREATMENT:  ?[facility] Adult PT Treatment:                                                 DATE:04/07/2022 ?Therapeutic Exercise: ?Nu-step L5 x 10 min UE/LE-  ?Double ER with scapular retraction 2 x 10 with RTB ?Shoulder rolls ?UT stretches 10 sec x 3 each ?Levator stretches 10 sec x 3 each ?Supine chin tuck into towel roll 1 x 10 holding 5 seconds -  ?Supine chin tuck x 10, with AROM horizontal abduction x 10,  ? ?Manual Therapy: ?Seated upper trap STW and TPR  ? ?Modalities:  ?HMP x 15 min cervical ? ? ?  ?[facility] Adult PT Treatment:                                                DATE:04/05/2022 ?Therapeutic Exercise: ?Nu-step L5 x 10 min UE/LE ?Double ER with scapular retraction 2 x 10 with RTB ?Shoulder rolls ?UT stretches 10 x 2 ?Seated thoracic rotation 2 x 10 with arms crossed - verbal cues to keep head in the middle ?Supine chin tuck 1 x 10 holding 5 seconds (while PTA providing sub occipital pressure to promote tack and stretch) ?Supine chin tuck with cervical rotaiton ?Supine chin tuck x 10, with AROM horizontal abduction x 10, with AROM alternating UE Flexion/extension x 10 ? ?Manual Therapy: ? ?Sub-occipital release  ?Modalities:  ?HMP x 15 min cervical ? ?[facility] Adult PT Treatment:                                                DATE:03/31/2022 ?Therapeutic Exercise: ?Nu-step L5 x 5 min UE/LE ?Supine chin tuck 1 x 10 holding 5 seconds (while laying on tennis balls to promote  tack and stretch) ?Seated thoracic rotation 2 x 10 with arms crossed - verbal cues to keep head in the middle ?Upper trap stretch 2 x 30 sec  ?Double ER with scapular retraction 2 x 10 with RTB ?Manual Therapy: ?R upper trap / levator scapulae MTPR with gradual graded increase in pressure ?IASTM along the R upper trap/ levator  ?Sub-occipital release using tennis balls ?Self Care: ?Reviewed sleeping posture and position to reduce abnormal tension on the neck/ shoulders. Proper use of pillows to keep her head in neural position.  ? ?[facility] Adult PT Treatment:                                                 DATE: 03/29/22 ?Therapeutic Exercise: ?Nustep L2 UE/LE x 10 minutes ?Pulleys 2 minutes , flexion ?Cervical rotation AROM 40 bilat  ?Supine with ball under head- cervical rotations, nods x 10 each  ?Supine with pillows -scap retract , chest press, shoulder pullovers ?Declined HMP -will do at home ? ? ?[facility] Adult PT Treatment:                                                DATE: 03/24/22 ?Therapeutic Exercise: ?Nustep L1 UE/LE x 5 minutes ?Pulleys 2 minutes , flexion ?Seated scap retract ?Seated shoulder rolls ?Upper trap stretch with progressive improvement in ROM  ?Seated chin tuck 5 sec x 10  ?Modalities: HMP trial for 15 minutes ? ?  ?PATIENT EDUCATION:  ?Education details: evaluation findings, POC, goals, HEP with proper form ?Person educated: Patient ?Education method: Explanation, Verbal cues, and Handouts ?Education comprehension: verbalized understanding ?  ?  ?HOME EXERCISE PROGRAM: ?Access Code: [email or web address] ?URL: [email or web address] ?Date: 03/31/2022 ?Prepared by: Lulu Riding ? ?Exercises ?- Standing Cervical Retraction  - 1 x daily - 7 x weekly - 2 sets - 10 reps ?- Supine Chin Tuck with Towel  - 1 x daily - 7 x weekly - 2 sets - 10 reps - 5 hold ?- Seated Upper Trapezius Stretch  - 1 x daily - 7 x weekly - 2 sets - 2 reps - 30 seconds hold ?- Gentle Levator Scapulae Stretch  - 1 x daily - 7 x weekly - 2 sets - 2 reps - 30 seconds hold ?- Seated Scapular Retraction  - 1 x daily - 7 x weekly - 2 sets - 10 reps - 5 seconds hold ?- Seated Thoracic Rotation  - 1 x daily - 7 x weekly - 2 sets - 10 reps - 2 hold ?ASSESSMENT: ?  ?CLINICAL IMPRESSION: ?Pt arrives to PT noting pain was at a 6/10 pointing to her upper traps. She reports neck pain is better overall.   Continued with mobility and stretching with DNF strengthening and cervical stabilization. Manual STW and TPR performed to upper traps with decreased tolerance on righ vs left.  She tolerated the session with improved  comfort. She did well with strengthening and reported the pain didn't worsen compared to when she arrived. HMP applied post session.  ?  ?  ?OBJECTIVE IMPAIRMENTS decreased activity tolerance, decreased end

## 2022-04-10 ENCOUNTER — Other Ambulatory Visit: Payer: Self-pay | Admitting: Endocrinology

## 2022-04-10 DIAGNOSIS — E119 Type 2 diabetes mellitus without complications: Secondary | ICD-10-CM

## 2022-04-11 ENCOUNTER — Other Ambulatory Visit: Payer: Self-pay | Admitting: Endocrinology

## 2022-04-11 DIAGNOSIS — E119 Type 2 diabetes mellitus without complications: Secondary | ICD-10-CM

## 2022-04-12 ENCOUNTER — Encounter: Payer: Self-pay | Admitting: Physical Therapy

## 2022-04-12 ENCOUNTER — Ambulatory Visit: Payer: Medicare HMO | Admitting: Physical Therapy

## 2022-04-12 DIAGNOSIS — R2689 Other abnormalities of gait and mobility: Secondary | ICD-10-CM | POA: Diagnosis not present

## 2022-04-12 DIAGNOSIS — M6281 Muscle weakness (generalized): Secondary | ICD-10-CM

## 2022-04-12 DIAGNOSIS — M62838 Other muscle spasm: Secondary | ICD-10-CM | POA: Diagnosis not present

## 2022-04-12 DIAGNOSIS — M542 Cervicalgia: Secondary | ICD-10-CM

## 2022-04-12 NOTE — Therapy (Signed)
?OUTPATIENT PHYSICAL THERAPY TREATMENT NOTE ? ? ?Patient Name: April [name] ?MRN: [id number] ?DOB:August 12, 1941, 81 y.o., female ?Today's Date: 04/12/2022 ? ?PCP: Kathyrn Lass, MD ?REFERRING PROVIDER: Kathyrn Lass, MD ? ?END OF SESSION:  ? PT End of Session - 04/12/22 1025   ? ? Visit Number 7   ? Number of Visits 13   ? Date for PT Re-Evaluation 05/03/22   ? Authorization Type [employer] MCR   ? Authorization Time Period 03/15/22-04/29/22; 12 visits   ? Authorization - Visit Number 7   ? Authorization - Number of Visits 12   ? PT Start Time 1020   ? PT Stop Time 1115   ? PT Time Calculation (min) 55 min   ? ?  ?  ? ?  ? ? ? ?Past Medical History:  ?Diagnosis Date  ? Allergic rhinitis   ? Diabetes mellitus   ? TYPE 2  ? Dyslipidemia   ? Hyperkalemia   ? Hypertension   ? Insomnia   ? Pt stated no trouble falling or staying asleep.  ? RA (rheumatoid arthritis) ([place])   ? ?Past Surgical History:  ?Procedure Laterality Date  ? BREAST CYST EXCISION Right 1967  ? HAND TENDON SURGERY    ? Right  ? OVARIAN CYST SURGERY Right 1988  ? ?Patient Active Problem List  ? Diagnosis Date Noted  ? Iron deficiency anemia 01/20/2019  ? Encounter for long-term (current) use of other medications 02/29/2012  ? Hypertension   ? Diabetes mellitus   ? Hypokalemia   ? RA (rheumatoid arthritis) ([place])   ? EDEMA 08/27/2010  ? CARPAL TUNNEL SYNDROME, BILATERAL 10/28/2009  ? NUMBNESS 10/28/2009  ? Diabetes mellitus without complication ([place]) 123456  ? Hyperlipidemia 09/19/2007  ? ALLERGIC RHINITIS 09/19/2007  ? INSOMNIA 09/19/2007  ? ? ?PCP: Kathyrn Lass, MD ?  ?REFERRING PROVIDER: Kathyrn Lass, MD ?  ?REFERRING DIAG: Cervicalgia [M54.2] ? ?THERAPY DIAG:  ?Cervicalgia ? ?Muscle weakness (generalized) ? ?Other muscle spasm ? ?PERTINENT HISTORY: DM, HTN  ? ?PRECAUTIONS: None ? ? ?SUBJECTIVE: I My right leg and my right shoulder and right neck were hurting. It started on Friday. I don't know why. It disappeared on Sunday.  ? ?PAIN:  ?Are you having  pain? Yes: NPRS scale: 5/10 ?Pain location: bilat upper traps to shoulders ?Pain description: sore ?Aggravating factors: quick / sudden motions, looking to the R ?Relieving factors: neck ?  ? ? ? ? ? ?OBJECTIVE:  ?*Unless otherwise noted by date, all objective measures were captured on initial evaluation.  ?  ?DIAGNOSTIC FINDINGS:  ?Nothing recent ?  ?PATIENT SURVEYS:  ?FOTO 34%, predicted 54% ?FOTO 47% status 04/12/22 7th visit ?  ?  ?COGNITION: ?Overall cognitive status: Within functional limits for tasks assessed ?  ?  ?SENSATION: ?WFL ?  ?POSTURE:  ?Forward head positioning ?  ?PALPATION: ?TTP along the R SCM, upper trap, levator scapuale, cervical parapsinals with multiple trigger points noted.    ?  ?CERVICAL ROM:  ?  ?Active ROM A/PROM (deg) ?03/15/2022 03/29/22  ?Flexion 30 ERP   ?Extension 28 ERP   ?Right lateral flexion 5   ?Left lateral flexion 5   ?Right rotation 27 40  ?Left rotation 31 40  ? (Blank rows = not tested) ?  ?UE ROM: ?  ?Active ROM Right ?03/15/2022 Left ?03/15/2022  ?Shoulder flexion      ?Shoulder extension      ?Shoulder abduction      ?Shoulder adduction      ?Shoulder extension      ?  Shoulder internal rotation      ?Shoulder external rotation      ?Elbow flexion      ?Elbow extension      ?Wrist flexion      ?Wrist extension      ?Wrist ulnar deviation      ?Wrist radial deviation      ?Wrist pronation      ?Wrist supination      ? (Blank rows = not tested) ?  ?UE MMT: ?  ?MMT Right ?03/15/2022 Left ?03/15/2022  ?Shoulder flexion      ?Shoulder extension      ?Shoulder abduction      ?Shoulder adduction      ?Shoulder extension      ?Shoulder internal rotation      ?Shoulder external rotation      ?Middle trapezius      ?Lower trapezius      ?Elbow flexion      ?Elbow extension      ?Wrist flexion      ?Wrist extension      ?Wrist ulnar deviation      ?Wrist radial deviation      ?Wrist pronation      ?Wrist supination      ?Grip strength      ? (Blank rows = not tested) ?  ?CERVICAL SPECIAL  TESTS:  ?N/A ?  ?  ?FUNCTIONAL TESTS:  ?N/A ?  ?  ?TODAY'S TREATMENT:  ?[facility] Adult PT Treatment:                                                DATE:04/12/2022 ?Therapeutic Exercise: ?Nu-step L5 x 10 min UE/LE-  ?Double ER with scapular retraction 3 x 10 with RTB ?Standing cane pullovers to 90 degrees x 15 - cues to prevent shoulder hike.  ?UT stretches 10 sec x 3 each ?Levator stretches 10 sec x 3 each ?Supine chin tuck into towel roll 1 x 10 holding 5 seconds -  ?Supine chin tuck with head lift 5 sec x 10 ? ?Modalities:  ?HMP x 15 min cervical ? ?[facility] PT Treatment:                                                DATE:04/07/2022 ?Therapeutic Exercise: ?Nu-step L5 x 10 min UE/LE-  ?Double ER with scapular retraction 2 x 10 with RTB ?Shoulder rolls ?UT stretches 10 sec x 3 each ?Levator stretches 10 sec x 3 each ?Supine chin tuck into towel roll 1 x 10 holding 5 seconds -  ?Supine chin tuck x 10, with AROM horizontal abduction x 10,  ? ?Manual Therapy: ?Seated upper trap STW and TPR  ? ?Modalities:  ?HMP x 15 min cervical ? ? ?  ?[facility] Adult PT Treatment:                                                DATE:04/05/2022 ?Therapeutic Exercise: ?Nu-step L5 x 10 min UE/LE ?Double ER with scapular retraction 2 x 10 with RTB ?Shoulder rolls ?UT stretches 10 x 2 ?Seated thoracic rotation 2  x 10 with arms crossed - verbal cues to keep head in the middle ?Supine chin tuck 1 x 10 holding 5 seconds (while PTA providing sub occipital pressure to promote tack and stretch) ?Supine chin tuck with cervical rotaiton ?Supine chin tuck x 10, with AROM horizontal abduction x 10, with AROM alternating UE Flexion/extension x 10 ? ?Manual Therapy: ? ?Sub-occipital release  ?Modalities:  ?HMP x 15 min cervical ? ?[facility] Adult PT Treatment:                                                DATE:03/31/2022 ?Therapeutic Exercise: ?Nu-step L5 x 5 min UE/LE ?Supine chin tuck 1 x 10 holding 5 seconds (while laying on tennis balls to promote tack and  stretch) ?Seated thoracic rotation 2 x 10 with arms crossed - verbal cues to keep head in the middle ?Upper trap stretch 2 x 30 sec  ?Double ER with scapular retraction 2 x 10 with RTB ?Manual Therapy: ?R upper trap / levator scapulae MTPR with gradual graded increase in pressure ?IASTM along the R upper trap/ levator  ?Sub-occipital release using tennis balls ?Self Care: ?Reviewed sleeping posture and position to reduce abnormal tension on the neck/ shoulders. Proper use of pillows to keep her head in neural position.  ? ?[facility] Adult PT Treatment:                                                DATE: 03/29/22 ?Therapeutic Exercise: ?Nustep L2 UE/LE x 10 minutes ?Pulleys 2 minutes , flexion ?Cervical rotation AROM 40 bilat  ?Supine with ball under head- cervical rotations, nods x 10 each  ?Supine with pillows -scap retract , chest press, shoulder pullovers ?Declined HMP -will do at home ? ? ? ? ?  ?PATIENT EDUCATION:  ?Education details: FOTO status  ?Person educated: Patient ?Education method: Explanation, Verbal cues, and Handouts ?Education comprehension: verbalized understanding ?  ?  ?HOME EXERCISE PROGRAM: ?Access Code: [email or web address] ?URL: [email or web address] ?Date: 03/31/2022 ?Prepared by: Starr Lake ? ?Exercises ?- Standing Cervical Retraction  - 1 x daily - 7 x weekly - 2 sets - 10 reps ?- Supine Chin Tuck with Towel  - 1 x daily - 7 x weekly - 2 sets - 10 reps - 5 hold ?- Seated Upper Trapezius Stretch  - 1 x daily - 7 x weekly - 2 sets - 2 reps - 30 seconds hold ?- Gentle Levator Scapulae Stretch  - 1 x daily - 7 x weekly - 2 sets - 2 reps - 30 seconds hold ?- Seated Scapular Retraction  - 1 x daily - 7 x weekly - 2 sets - 10 reps - 5 seconds hold ?- Seated Thoracic Rotation  - 1 x daily - 7 x weekly - 2 sets - 10 reps - 2 hold ?ASSESSMENT: ?  ?CLINICAL IMPRESSION: ?Pt arrives to PT noting pain was at a 5/10 pointing to her upper traps. She reports neck pain is better overall. She had an  episode on increased pain on right side LE and neck which lasted Friday to Sunday and went away. Continued with mobility and stretching with DNF strengthening and cervical stabilization. She tolerated the ses

## 2022-04-14 ENCOUNTER — Encounter: Payer: Self-pay | Admitting: Physical Therapy

## 2022-04-14 ENCOUNTER — Other Ambulatory Visit: Payer: Self-pay

## 2022-04-14 ENCOUNTER — Ambulatory Visit: Payer: Medicare HMO | Admitting: Physical Therapy

## 2022-04-14 DIAGNOSIS — M62838 Other muscle spasm: Secondary | ICD-10-CM

## 2022-04-14 DIAGNOSIS — M542 Cervicalgia: Secondary | ICD-10-CM | POA: Diagnosis not present

## 2022-04-14 DIAGNOSIS — M6281 Muscle weakness (generalized): Secondary | ICD-10-CM | POA: Diagnosis not present

## 2022-04-14 DIAGNOSIS — E119 Type 2 diabetes mellitus without complications: Secondary | ICD-10-CM

## 2022-04-14 DIAGNOSIS — R2689 Other abnormalities of gait and mobility: Secondary | ICD-10-CM | POA: Diagnosis not present

## 2022-04-14 MED ORDER — ACCU-CHEK GUIDE VI STRP: ORAL_STRIP | 2 refills | Status: DC

## 2022-04-14 NOTE — Therapy (Signed)
?OUTPATIENT PHYSICAL THERAPY TREATMENT NOTE ? ? ?Patient Name: April [name] ?MRN: [id number] ?DOB:02-21-1941, 81 y.o., female ?Today's Date: 04/14/2022 ? ?PCP: Kathyrn Lass, MD ?REFERRING PROVIDER: Kathyrn Lass, MD ? ?END OF SESSION:  ? PT End of Session - 04/14/22 0927   ? ? Visit Number 8   ? Number of Visits 13   ? Date for PT Re-Evaluation 05/03/22   ? Authorization Type [employer] MCR   ? Authorization Time Period 03/15/22-04/29/22; 12 visits   ? Authorization - Visit Number 8   ? Authorization - Number of Visits 12   ? PT Start Time [phone]   ? PT Stop Time 1011   ? PT Time Calculation (min) 43 min   ? Activity Tolerance Patient tolerated treatment well   ? Behavior During Therapy [facility] for tasks assessed/performed   ? ?  ?  ? ?  ? ? ? ? ?Past Medical History:  ?Diagnosis Date  ? Allergic rhinitis   ? Diabetes mellitus   ? TYPE 2  ? Dyslipidemia   ? Hyperkalemia   ? Hypertension   ? Insomnia   ? Pt stated no trouble falling or staying asleep.  ? RA (rheumatoid arthritis) ([place])   ? ?Past Surgical History:  ?Procedure Laterality Date  ? BREAST CYST EXCISION Right 1967  ? HAND TENDON SURGERY    ? Right  ? OVARIAN CYST SURGERY Right 1988  ? ?Patient Active Problem List  ? Diagnosis Date Noted  ? Iron deficiency anemia 01/20/2019  ? Encounter for long-term (current) use of other medications 02/29/2012  ? Hypertension   ? Diabetes mellitus   ? Hypokalemia   ? RA (rheumatoid arthritis) ([place])   ? EDEMA 08/27/2010  ? CARPAL TUNNEL SYNDROME, BILATERAL 10/28/2009  ? NUMBNESS 10/28/2009  ? Diabetes mellitus without complication ([place]) [id number]  ? Hyperlipidemia 09/19/2007  ? ALLERGIC RHINITIS 09/19/2007  ? INSOMNIA 09/19/2007  ? ? ?PCP: Kathyrn Lass, MD ?  ?REFERRING PROVIDER: Kathyrn Lass, MD ?  ?REFERRING DIAG: Cervicalgia [M54.2] ? ?THERAPY DIAG:  ?Cervicalgia ? ?Muscle weakness (generalized) ? ?Other muscle spasm ? ?PERTINENT HISTORY: DM, HTN  ? ?PRECAUTIONS: None ? ? ?SUBJECTIVE: " I fluctuate, I am up/ down. Sometimes I  feel better then some times I feel a little sore." ? ?PAIN:  ?Are you having pain? Yes: NPRS scale: 5/10 ?Pain location: bilat upper traps to shoulders ?Pain description: sore ?Aggravating factors: quick / sudden motions, looking to the R ?Relieving factors: neck ?  ? ? ? ? ? ?OBJECTIVE:  ?*Unless otherwise noted by date, all objective measures were captured on initial evaluation.  ?  ?DIAGNOSTIC FINDINGS:  ?Nothing recent ?  ?PATIENT SURVEYS:  ?FOTO 34%, predicted 54% ?FOTO 47% status 04/12/22 7th visit ?  ?  ?COGNITION: ?Overall cognitive status: Within functional limits for tasks assessed ?  ?  ?SENSATION: ?WFL ?  ?POSTURE:  ?Forward head positioning ?  ?PALPATION: ?TTP along the R SCM, upper trap, levator scapuale, cervical parapsinals with multiple trigger points noted.    ?  ?CERVICAL ROM:  ?  ?Active ROM A/PROM (deg) ?03/15/2022 03/29/22  ?Flexion 30 ERP   ?Extension 28 ERP   ?Right lateral flexion 5   ?Left lateral flexion 5   ?Right rotation 27 40  ?Left rotation 31 40  ? (Blank rows = not tested) ?  ?UE ROM: ?  ?Active ROM Right ?03/15/2022 Left ?03/15/2022  ?Shoulder flexion      ?Shoulder extension      ?Shoulder abduction      ?  Shoulder adduction      ?Shoulder extension      ?Shoulder internal rotation      ?Shoulder external rotation      ?Elbow flexion      ?Elbow extension      ?Wrist flexion      ?Wrist extension      ?Wrist ulnar deviation      ?Wrist radial deviation      ?Wrist pronation      ?Wrist supination      ? (Blank rows = not tested) ?  ?UE MMT: ?  ?MMT Right ?03/15/2022 Left ?03/15/2022  ?Shoulder flexion      ?Shoulder extension      ?Shoulder abduction      ?Shoulder adduction      ?Shoulder extension      ?Shoulder internal rotation      ?Shoulder external rotation      ?Middle trapezius      ?Lower trapezius      ?Elbow flexion      ?Elbow extension      ?Wrist flexion      ?Wrist extension      ?Wrist ulnar deviation      ?Wrist radial deviation      ?Wrist pronation      ?Wrist  supination      ?Grip strength      ? (Blank rows = not tested) ?  ?CERVICAL SPECIAL TESTS:  ?N/A ?  ?  ?FUNCTIONAL TESTS:  ?N/A ?  ?  ?TODAY'S TREATMENT:  ? ?[facility] Adult PT Treatment:                                                DATE: 04/14/2022 ?Therapeutic Exercise: ?UBE L3 x 6 min (FWD/BWD x 3 min ea) ?Upper trap stretch bil 2 x 30 sec ?Seated chin tuck (pressing head into pillow behind head) 1 x 10 holding 5 seconds ?Double scapular retraction with ER 2 x 10 with RTB seated ?Overhead seated [employer] press 2 x 10 , first set with no weight, 2nd set with 1# bil ? ?Reviewed and updated HEP today for money and overhead [employer] ?Manual Therapy: ?MTPR along the B upper trap/ levator scapulae and scalene ?DTM / p?trissage along bil upper trap ?IASTM along bil upper trap ? ? ?[facility] Adult PT Treatment:                                                DATE:04/12/2022 ?Therapeutic Exercise: ?Nu-step L5 x 10 min UE/LE-  ?Double ER with scapular retraction 3 x 10 with RTB ?Standing cane pullovers to 90 degrees x 15 - cues to prevent shoulder hike.  ?UT stretches 10 sec x 3 each ?Levator stretches 10 sec x 3 each ?Supine chin tuck into towel roll 1 x 10 holding 5 seconds -  ?Supine chin tuck with head lift 5 sec x 10 ? ?Modalities:  ?HMP x 15 min cervical ? ?[facility] Adult PT Treatment:  DATE:04/07/2022 ?Therapeutic Exercise: ?Nu-step L5 x 10 min UE/LE-  ?Double ER with scapular retraction 2 x 10 with RTB ?Shoulder rolls ?UT stretches 10 sec x 3 each ?Levator stretches 10 sec x 3 each ?Supine chin tuck into towel roll 1 x 10 holding 5 seconds -  ?Supine chin tuck x 10, with AROM horizontal abduction x 10,  ? ?Manual Therapy: ?Seated upper trap STW and TPR  ? ?Modalities:  ?HMP x 15 min cervical ? ? ?  ?PATIENT EDUCATION:  ?Education details: FOTO status  ?Person educated: Patient ?Education method: Explanation, Verbal cues, and Handouts ?Education comprehension: verbalized  understanding ?  ?  ?HOME EXERCISE PROGRAM: ?Access Code: [email or web address] ?URL: [email or web address] ?Date: 04/14/2022 ?Prepared by: Starr Lake ? ?Exercises ?- Standing Cervical Retraction  - 1 x daily - 7 x weekly - 2 sets - 10 reps ?- Supine Chin Tuck with Towel  - 1 x daily - 7 x weekly - 2 sets - 10 reps - 5 hold ?- Seated Upper Trapezius Stretch  - 1 x daily - 7 x weekly - 2 sets - 2 reps - 30 seconds hold ?- Gentle Levator Scapulae Stretch  - 1 x daily - 7 x weekly - 2 sets - 2 reps - 30 seconds hold ?- Seated Scapular Retraction  - 1 x daily - 7 x weekly - 2 sets - 10 reps - 5 seconds hold ?- Seated Thoracic Rotation  - 1 x daily - 7 x weekly - 2 sets - 10 reps - 2 hold ?- Scapular retraction with ER (MONEY)  - 1 x daily - 7 x weekly - 2 sets - 10 reps ?- Seated Overhead Press  - 1 x daily - 7 x weekly - 2 sets - 10 reps ?ASSESSMENT: ?  ?CLINICAL IMPRESSION: ?Mrs Cornisha reports she is making progress and notes her pain fluctuates depending on her activities. Continued STW with graded pressure techniques which she tolerates better noting decreased tension. Continued working on posterior shoulder activation and scapular stability which she did well with. End of session she declined modalities and noted her R shoulder felt good end of session.  ?  ?  ?OBJECTIVE IMPAIRMENTS decreased activity tolerance, decreased endurance, decreased ROM, decreased strength, increased fascial restrictions, increased muscle spasms, postural dysfunction, and pain.  ?  ?ACTIVITY LIMITATIONS cleaning and community activity.  ?  ?PERSONAL FACTORS Age and 1 comorbidity: DM  are also affecting patient's functional outcome.  ?  ?  ?REHAB POTENTIAL: Good ?  ?CLINICAL DECISION MAKING: Evolving/moderate complexity ?  ?EVALUATION COMPLEXITY: Moderate ?  ?  ?GOALS: ?Goals reviewed with patient? Yes ?  ?SHORT TERM GOALS: Target date: 04/05/2022 ?  ?Pt to be IND with initial HEP for therapeutic progression ?Baseline: no previous  HEP ?Goal status: MET 04/05/22 ?  ?2.  Reduce muscle spasm in the upper trap and surrounding musculature to promote cervical mobility and reduce max pain to </= 5/10 ?Baseline: current pain 8/10 with increased spasm in th

## 2022-04-18 ENCOUNTER — Other Ambulatory Visit (INDEPENDENT_AMBULATORY_CARE_PROVIDER_SITE_OTHER): Payer: Medicare HMO

## 2022-04-18 DIAGNOSIS — E119 Type 2 diabetes mellitus without complications: Secondary | ICD-10-CM

## 2022-04-18 LAB — HEMOGLOBIN A1C: Hgb A1c MFr Bld: 5.8 % (ref 4.6–6.5)

## 2022-04-19 ENCOUNTER — Ambulatory Visit: Payer: Medicare HMO | Admitting: Physical Therapy

## 2022-04-19 ENCOUNTER — Encounter: Payer: Self-pay | Admitting: Physical Therapy

## 2022-04-19 DIAGNOSIS — M6281 Muscle weakness (generalized): Secondary | ICD-10-CM | POA: Diagnosis not present

## 2022-04-19 DIAGNOSIS — R2689 Other abnormalities of gait and mobility: Secondary | ICD-10-CM | POA: Diagnosis not present

## 2022-04-19 DIAGNOSIS — M542 Cervicalgia: Secondary | ICD-10-CM | POA: Diagnosis not present

## 2022-04-19 DIAGNOSIS — M62838 Other muscle spasm: Secondary | ICD-10-CM | POA: Diagnosis not present

## 2022-04-19 LAB — BASIC METABOLIC PANEL
BUN: 18 mg/dL (ref 6–23)
CO2: 21 mEq/L (ref 19–32)
Calcium: 9.2 mg/dL (ref 8.4–10.5)
Chloride: 106 mEq/L (ref 96–112)
Creatinine, Ser: 1.16 mg/dL (ref 0.40–1.20)
GFR: 45.04 mL/min — ABNORMAL LOW (ref >60.00)
Glucose, Bld: 75 mg/dL (ref 70–99)
Potassium: 3.9 mEq/L (ref 3.5–5.1)
Sodium: 139 mEq/L (ref 135–145)

## 2022-04-19 LAB — MICROALBUMIN / CREATININE URINE RATIO
Creatinine,U: 285 mg/dL
Microalb Creat Ratio: 1.6 mg/g (ref 0.0–30.0)
Microalb, Ur: 4.5 mg/dL — ABNORMAL HIGH (ref 0.0–1.9)

## 2022-04-19 NOTE — Therapy (Signed)
?OUTPATIENT PHYSICAL THERAPY TREATMENT NOTE ?Progress Note ?Reporting Period 03/15/2022 to 04/19/2022 ? ?See note below for Objective Data and Assessment of Progress/Goals.  ? ? ? ? ? ? ?Patient Name: April [name] ?MRN: [id number] ?DOB:01/25/1941, 81 y.o., female ?Today's Date: 04/19/2022 ? ?PCP: Kathyrn Lass, MD ?REFERRING PROVIDER: Kathyrn Lass, MD ? ?END OF SESSION:  ? PT End of Session - 04/19/22 1014   ? ? Visit Number 9   ? Number of Visits 13   ? Date for PT Re-Evaluation 05/03/22   ? Authorization Type [employer] MCR   ? Authorization Time Period 03/15/22-04/29/22; 12 visits   ? Authorization - Visit Number 9   ? Authorization - Number of Visits 12   ? PT Start Time 1013   ? PT Stop Time 1058   ? PT Time Calculation (min) 45 min   ? Activity Tolerance Patient tolerated treatment well   ? Behavior During Therapy [facility] for tasks assessed/performed   ? ?  ?  ? ?  ? ? ? ? ? ?Past Medical History:  ?Diagnosis Date  ? Allergic rhinitis   ? Diabetes mellitus   ? TYPE 2  ? Dyslipidemia   ? Hyperkalemia   ? Hypertension   ? Insomnia   ? Pt stated no trouble falling or staying asleep.  ? RA (rheumatoid arthritis) ([place])   ? ?Past Surgical History:  ?Procedure Laterality Date  ? BREAST CYST EXCISION Right 1967  ? HAND TENDON SURGERY    ? Right  ? OVARIAN CYST SURGERY Right 1988  ? ?Patient Active Problem List  ? Diagnosis Date Noted  ? Iron deficiency anemia 01/20/2019  ? Encounter for long-term (current) use of other medications 02/29/2012  ? Hypertension   ? Diabetes mellitus   ? Hypokalemia   ? RA (rheumatoid arthritis) ([place])   ? EDEMA 08/27/2010  ? CARPAL TUNNEL SYNDROME, BILATERAL 10/28/2009  ? NUMBNESS 10/28/2009  ? Diabetes mellitus without complication ([place]) 18/56/3149  ? Hyperlipidemia 09/19/2007  ? ALLERGIC RHINITIS 09/19/2007  ? INSOMNIA 09/19/2007  ? ? ?PCP: Kathyrn Lass, MD ?  ?REFERRING PROVIDER: Kathyrn Lass, MD ?  ?REFERRING DIAG: Cervicalgia [M54.2] ? ?THERAPY DIAG:  ?Cervicalgia ? ?Muscle weakness  (generalized) ? ?PERTINENT HISTORY: DM, HTN  ? ?PRECAUTIONS: None ? ? ?SUBJECTIVE: "I just had an issue the other day my R hand and I was unable to open my hand. The neck continues to fluctuate depending on activity." ? ?PAIN:  ?Are you having pain? Yes: NPRS scale: 7/10 ?Pain location: R shoulder / upper trap ?Pain description: sore ?Aggravating factors: quick / sudden motions, looking to the R ?Relieving factors: neck ?  ? ? ? ? ? ?OBJECTIVE:  ?*Unless otherwise noted by date, all objective measures were captured on initial evaluation.  ?  ?DIAGNOSTIC FINDINGS:  ?Nothing recent ?  ?PATIENT SURVEYS:  ?FOTO 34%, predicted 54% ?FOTO 47% status 04/12/22 7th visit ?  ?  ?COGNITION: ?Overall cognitive status: Within functional limits for tasks assessed ?  ?  ?SENSATION: ?WFL ?  ?POSTURE:  ?Forward head positioning ?  ?PALPATION: ?TTP along the R SCM, upper trap, levator scapuale, cervical parapsinals with multiple trigger points noted.    ?  ?CERVICAL ROM:  ?  ?Active ROM A/PROM (deg) ?03/15/2022 03/29/22 04/19/2022  ?Flexion 30 ERP  48  ?Extension 28 ERP  36  ?Right lateral flexion 5  10  ?Left lateral flexion 5  10  ?Right rotation 27 40 55  ?Left rotation 31 40 58  ? (  Blank rows = not tested) ?  ?UE ROM: ?  ?Active ROM Right ?03/15/2022 Left ?03/15/2022  ?Shoulder flexion      ?Shoulder extension      ?Shoulder abduction      ?Shoulder adduction      ?Shoulder extension      ?Shoulder internal rotation      ?Shoulder external rotation      ?Elbow flexion      ?Elbow extension      ?Wrist flexion      ?Wrist extension      ?Wrist ulnar deviation      ?Wrist radial deviation      ?Wrist pronation      ?Wrist supination      ? (Blank rows = not tested) ?  ?UE MMT: ?  ?MMT Right ?03/15/2022 Left ?03/15/2022 Right ?04/19/2022 Left ?04/19/2022  ?Shoulder flexion     4/5 4/5  ?Shoulder extension     4+/5 4/5  ?Shoulder abduction     4-/5 4-/5  ?Shoulder adduction        ?Shoulder extension        ?Shoulder internal rotation     4/5  4-/5  ?Shoulder external rotation     4/5 4-/5  ?Middle trapezius        ?Lower trapezius        ?Elbow flexion        ?Elbow extension        ?Wrist flexion        ?Wrist extension        ?Wrist ulnar deviation        ?Wrist radial deviation        ?Wrist pronation        ?Wrist supination        ?Grip strength        ? (Blank rows = not tested) ?  ?CERVICAL SPECIAL TESTS:  ?N/A ?  ?  ?FUNCTIONAL TESTS:  ?N/A ?  ?  ?TODAY'S TREATMENT:  ?[facility] Adult PT Treatment:                                                DATE: 04/19/2022 ?Therapeutic Exercise: ?Upper trap stretch 2 x 30 seconds bil ?MAI chin tuck 1 x 10 neutral, 1 x 10 rotated to the R, 1 x 10 rotated to the L ?Seated upper cervical rotation 1 x 10 ? ?Updated HEP for upper cervical mobility  ?Manual Therapy: ?MTPR along the upper trap on the R and how it can be done at home and tools that can be used to assist with treatment.  ?Sub-occipital release  ?Cervical C3-C7 lateral gapping ?End range PROM cervical rotation with gentle oscillations for pain ? ?Therapeutic Activity: ?Reviewed pt progress with cerivcal mobility, as well as tenderness related to her muscle spasm. Effects of weakness in the bil UE especially her L shoulder.  ? ? ?[facility] Adult PT Treatment:                                                DATE: 04/14/2022 ?Therapeutic Exercise: ?UBE L3 x 6 min (FWD/BWD x 3 min ea) ?Upper trap stretch bil 2 x 30 sec ?Seated chin tuck (pressing head into  pillow behind head) 1 x 10 holding 5 seconds ?Double scapular retraction with ER 2 x 10 with RTB seated ?Overhead seated [employer] press 2 x 10 , first set with no weight, 2nd set with 1# bil ? ?Reviewed and updated HEP today for money and overhead [employer] ?Manual Therapy: ?MTPR along the B upper trap/ levator scapulae and scalene ?DTM / p?trissage along bil upper trap ?IASTM along bil upper trap ? ? ?[place] Adult PT Treatment:                                                DATE:04/12/2022 ?Therapeutic  Exercise: ?Nu-step L5 x 10 min UE/LE-  ?Double ER with scapular retraction 3 x 10 with RTB ?Standing cane pullovers to 90 degrees x 15 - cues to prevent shoulder hike.  ?UT stretches 10 sec x 3 each ?Levator stretches 10 sec x 3 each ?Supine chin tuck into towel roll 1 x 10 holding 5 seconds -  ?Supine chin tuck with head lift 5 sec x 10 ? ?Modalities:  ?HMP x 15 min cervical ?  ?PATIENT EDUCATION:  ?Education details: FOTO status  ?Person educated: Patient ?Education method: Explanation, Verbal cues, and Handouts ?Education comprehension: verbalized understanding ?  ?  ?HOME EXERCISE PROGRAM: ?Access Code: [email or web address] ?URL: [email or web address] ?Date: 04/19/2022 ?Prepared by: Starr Lake ? ?Exercises ?- Standing Cervical Retraction  - 1 x daily - 7 x weekly - 2 sets - 10 reps ?- Supine Chin Tuck with Towel  - 1 x daily - 7 x weekly - 2 sets - 10 reps - 5 hold ?- Seated Upper Trapezius Stretch  - 1 x daily - 7 x weekly - 2 sets - 2 reps - 30 seconds hold ?- Gentle Levator Scapulae Stretch  - 1 x daily - 7 x weekly - 2 sets - 2 reps - 30 seconds hold ?- Seated Scapular Retraction  - 1 x daily - 7 x weekly - 2 sets - 10 reps - 5 seconds hold ?- Seated Thoracic Rotation  - 1 x daily - 7 x weekly - 2 sets - 10 reps - 2 hold ?- Scapular retraction with ER (MONEY)  - 1 x daily - 7 x weekly - 2 sets - 10 reps ?- Seated Overhead Press  - 1 x daily - 7 x weekly - 2 sets - 10 reps ?- 3 Finger Cervical Rotation  - 1 x daily - 7 x weekly - 2 sets - 10 reps ?ASSESSMENT: ?  ?CLINICAL IMPRESSION: ?Mrs Pfahler continues to make progress with physical therapy increasing her cervical mobility and improving bil shoulder strength. She does continue to report R upper trap / cervical pain that fluctuates in severity depending on her activity. She met STGS 1,3 and LTG #1 goals. Continued working on manual trigger point release and taught how it can be done at home. She did well with cervical mobs to reduce potential  facet aggravation and promote cervical mobility. She did well with session noting minimal change in soreness. She would benefit from physical therapy to reduce pain and muscle tension, improve gross UE/ posterior chain s

## 2022-04-20 ENCOUNTER — Other Ambulatory Visit: Payer: Self-pay

## 2022-04-20 ENCOUNTER — Encounter: Payer: Self-pay | Admitting: Endocrinology

## 2022-04-20 ENCOUNTER — Ambulatory Visit (INDEPENDENT_AMBULATORY_CARE_PROVIDER_SITE_OTHER): Payer: Medicare HMO | Admitting: Endocrinology

## 2022-04-20 VITALS — BP 118/64 | HR 67 | Ht 64.0 in | Wt 160.2 lb

## 2022-04-20 DIAGNOSIS — E7800 Pure hypercholesterolemia, unspecified: Secondary | ICD-10-CM

## 2022-04-20 DIAGNOSIS — E876 Hypokalemia: Secondary | ICD-10-CM | POA: Diagnosis not present

## 2022-04-20 DIAGNOSIS — I10 Essential (primary) hypertension: Secondary | ICD-10-CM | POA: Diagnosis not present

## 2022-04-20 DIAGNOSIS — E119 Type 2 diabetes mellitus without complications: Secondary | ICD-10-CM

## 2022-04-20 MED ORDER — ACCU-CHEK GUIDE VI STRP: ORAL_STRIP | 2 refills | Status: DC

## 2022-04-20 MED ORDER — ACCU-CHEK SOFTCLIX LANCETS MISC: 2 refills | Status: DC

## 2022-04-20 NOTE — Patient Instructions (Addendum)
Take 1/2 Labetalol twice daily for 10 days and if BP still <125 can stop this ?

## 2022-04-20 NOTE — Progress Notes (Signed)
? ? ? ?Patient ID: April [name], female   DOB: [birth year], 81 y.o.   MRN: [id number] ? ? ?Reason for Appointment: follow-up of various problems ? ?History of Present Illness  ? ? ?HYPERTENSION:  diagnosed around 1979 with symptoms of headaches ?Previously she was taking Avapro, clonidine 0.6 at bedtime and Dyazide but blood pressure was relatively high with this regimen ? ?For evaluation of her hyperaldosteronism she was switched to labetalol 100 mg twice a day, Tenex 2 mg and doxazosin ?With this regimen her blood pressure was much better ?She was subsequently started on Aldactone to help with her severe hypokalemia and doxazosin and guanfacine were stopped ? ?CURRENT regimen of Aldactone 50 mg daily, labetalol 100 mg twice a day  ? ?Since her renal function was impaired in April 2022 she was told to reduce her Aldactone to half of the 100 mg tablet ?Previously also on HCTZ ?Also in January she was supposed to cut back her labetalol to half tablet but she forgot ?No lightheadedness ? ?She is checking blood pressure at home with a [employer] meter ?This appears to be accurate in the office  ?At home usually blood pressure is in the 120s ? ? ?BP Readings from Last 3 Encounters:  ?04/20/22 118/64  ?12/15/21 (!) 118/58  ?10/15/21 (!) 118/58  ? ?RENAL function as follows: ? ? ?Lab Results  ?Component Value Date  ? CREATININE 1.16 04/18/2022  ? CREATININE 1.01 12/13/2021  ? CREATININE 1.03 (H) 10/15/2021  ? ? ? ?HYPOKALEMIA:  ? ?This previously had been a significant longstanding problem and associated with hypertension  ?Previously had  been prescribed 6 tablets of potassium daily ?She was off her diuretics and Avapro when her evaluation for hyperaldosteronism was done, aldosterone level was 6.0 along with a relatively low renin level ? ?She has a normal potassium level consistently with Aldactone, taking 50 mg daily along with 1 tablet of potassium 20 mEq daily ? ?Lab Results  ?Component Value Date  ? K 3.9  04/18/2022  ? ? ? ?DIABETES type II:  ? ?She has had diabetes since at least 2015 ?This has been  well controlled with a regimen of metformin 1 g twice a day  ? ?A1c has been consistently upper normal, now 5.8 ? ?She is using a One Touch ultra meter ? ?She takes Metformin 1 g twice a day regularly with meals ?She has consistently good control with monotherapy ? ?Recent blood sugars range 79-99 with average 90 ? ?Because of her joint pains she cannot do much exercise ? ?She is usually consistent with a good diet ?Weight is up 4 pounds ? ?Weight history: ? ? ?Wt Readings from Last 3 Encounters:  ?04/20/22 160 lb 3.2 oz (72.7 kg)  ?12/15/21 156 lb 9.6 oz (71 kg)  ?10/15/21 156 lb (70.8 kg)  ? ? ?  ?Lab Results  ?Component Value Date  ? HGBA1C 5.8 04/18/2022  ? HGBA1C 6.0 12/13/2021  ? HGBA1C 5.8 08/03/2021  ? ?Lab Results  ?Component Value Date  ? MICROALBUR 4.5 (H) 04/18/2022  ? [place] 70 12/13/2021  ? CREATININE 1.16 04/18/2022  ? ? ?OTHER active problems discussed today are in review of systems ? ?LABS: ? ?Lab on 04/18/2022  ?Component Date Value Ref Range Status  ? Microalb, Ur 04/18/2022 4.5 (H)  0.0 - 1.9 mg/dL Final  ? Creatinine,U 04/18/2022 285.0  mg/dL Final  ? Microalb Creat Ratio 04/18/2022 1.6  0.0 - 30.0 mg/g Final  ? Sodium 04/18/2022 139  135 -  145 mEq/L Final  ? Potassium 04/18/2022 3.9  3.5 - 5.1 mEq/L Final  ? Chloride 04/18/2022 106  96 - 112 mEq/L Final  ? CO2 04/18/2022 21  19 - 32 mEq/L Final  ? Glucose, Bld 04/18/2022 75  70 - 99 mg/dL Final  ? BUN 04/18/2022 18  6 - 23 mg/dL Final  ? Creatinine, Ser 04/18/2022 1.16  0.40 - 1.20 mg/dL Final  ? GFR 04/18/2022 45.04 (L)  >60.00 mL/min Final  ? Calculated using the CKD-EPI Creatinine Equation (2021)  ? Calcium 04/18/2022 9.2  8.4 - 10.5 mg/dL Final  ? Hgb A1c MFr Bld 04/18/2022 5.8  4.6 - 6.5 % Final  ? Glycemic Control Guidelines for People with Diabetes:Non Diabetic:  <6%Goal of Therapy: <7%Additional Action Suggested:  >8%   ? ? ?Allergies as  of 04/20/2022   ? ?   Reactions  ? Acetaminophen Nausea Only  ? Actos [pioglitazone Hydrochloride]   ? edema  ? Aspirin Nausea Only  ? Atorvastatin Other (See Comments)  ? Felt drunk/high floating  ? Morphine Sulfate Other (See Comments)  ? Burns injecting "arm is on fire"  ? Naproxen Sodium Nausea Only  ? Sitagliptin Phosphate Nausea And Vomiting  ? ?  ? ?  ?Medication List  ?  ? ?  ? Accurate as of Apr 20, 2022  8:44 AM. If you have any questions, ask your nurse or doctor.  ?  ?  ? ?  ? ?Accu-Chek Guide w/Device Kit ?Use to test blood sugar once daily ?  ?Accu-Chek Softclix Lancets lancets ?Use to check blood sugar once daily ?  ?Co Q 10 100 MG Caps ?Take by mouth daily. ?  ?ezetimibe 10 MG tablet ?Commonly known as: ZETIA ?Take 10 mg by mouth daily. ?  ?fluticasone 50 MCG/ACT nasal spray ?Commonly known as: FLONASE ?1 spray in each nostril ?  ?folic acid 0.5 MG tablet ?Commonly known as: FOLVITE ?Take 0.5 mg by mouth daily. ?  ?gabapentin 300 MG capsule ?Commonly known as: NEURONTIN ?Take 300 mg by mouth daily. ?  ?Ginger 500 MG Caps ?Take 1 capsule by mouth daily. ?  ?labetalol 100 MG tablet ?Commonly known as: NORMODYNE ?TAKE 1 TABLET BY MOUTH TWICE A DAY ?  ?leflunomide 10 MG tablet ?Commonly known as: ARAVA ?Take 10 mg by mouth daily. ?  ?metFORMIN 1000 MG tablet ?Commonly known as: GLUCOPHAGE ?TAKE 1 TABLET BY MOUTH TWICE A DAY ?  ?methotrexate 2.5 MG tablet ?Commonly known as: RHEUMATREX ?Take 15 mg by mouth once a week. Patient takes 3 tablets twice once a week to add up to be 6 tablets once weekly, starts with 2 tablets in the evening and takes 2 more tablets in the morning ?  ?montelukast 10 MG tablet ?Commonly known as: SINGULAIR ?Take 10 mg by mouth daily. ?  ?OneTouch Ultra test strip ?Generic drug: glucose blood ?USE AS DIRECTED TO CHECK BLOOD SUGAR ONCE DAILY ?  ?Accu-Chek Guide test strip ?Generic drug: glucose blood ?Use to check blood sugar once a day ?  ?potassium chloride SA 20 MEQ  tablet ?Commonly known as: Klor-Con M20 ?TAKE 1/2 TABLET (10MEQ TOTAL) BY MOUTH ONCE DAILY. ?  ?pravastatin 80 MG tablet ?Commonly known as: PRAVACHOL ?TAKE 1 TABLET BY MOUTH EVERY DAY ?  ?predniSONE 10 MG tablet ?Commonly known as: DELTASONE ?Take by mouth. ?  ?spironolactone 100 MG tablet ?Commonly known as: ALDACTONE ?TAKE 1 TABLET BY MOUTH EVERY DAY ?What changed: how much to take ?  ?tapentadol 50  MG tablet ?Commonly known as: NUCYNTA ?Take 50 mg by mouth as needed for severe pain (spasms). ?  ?Turmeric 450 MG Caps ?Take 1 capsule by mouth daily. ?  ?vitamin B-12 1000 MCG tablet ?Commonly known as: CYANOCOBALAMIN ?Take 1,000 mcg by mouth daily. ?  ?ZYRTEC PO ?1 tab ?  ? ?  ? ? ?Allergies:  ?Allergies  ?Allergen Reactions  ? Acetaminophen Nausea Only  ? Actos [Pioglitazone Hydrochloride]   ?  edema  ? Aspirin Nausea Only  ? Atorvastatin Other (See Comments)  ?  Felt drunk/high floating  ? Morphine Sulfate Other (See Comments)  ?  Burns injecting "arm is on fire"  ? Naproxen Sodium Nausea Only  ? Sitagliptin Phosphate Nausea And Vomiting  ? ? ?Past Medical History:  ?Diagnosis Date  ? Allergic rhinitis   ? Diabetes mellitus   ? TYPE 2  ? Dyslipidemia   ? Hyperkalemia   ? Hypertension   ? Insomnia   ? Pt stated no trouble falling or staying asleep.  ? RA (rheumatoid arthritis) ([place])   ? ? ?Past Surgical History:  ?Procedure Laterality Date  ? BREAST CYST EXCISION Right 1967  ? HAND TENDON SURGERY    ? Right  ? OVARIAN CYST SURGERY Right 1988  ? ? ?Family History  ?Problem Relation Age of Onset  ? Diabetes Mother   ? Diabetes Father   ? Cancer Father   ?     Prostate  ? Diabetes Sister   ? Stroke Maternal Grandmother   ?     Cause of death  ? Healthy Brother   ? ? ?Social History:  reports that she has never smoked. She has never used smokeless tobacco. She reports that she does not drink alcohol and does not use drugs. ? ?Review of Systems: ? ? ?History of rheumatoid arthritis followed by rheumatologist ?Off 10 mg  prednisone ?Also takes gabapentin for pain ? ?Vitamin D deficiency: She is taking vitamin D3, 1000 U daily ? ?LIPIDS: Taking Zetia from PCP and is also on pravastatin 80 mg ?LDL improved with adding Zetia, last 67

## 2022-04-21 ENCOUNTER — Encounter: Payer: Self-pay | Admitting: Physical Therapy

## 2022-04-21 ENCOUNTER — Ambulatory Visit: Payer: Medicare HMO | Admitting: Physical Therapy

## 2022-04-21 DIAGNOSIS — M62838 Other muscle spasm: Secondary | ICD-10-CM

## 2022-04-21 DIAGNOSIS — R2689 Other abnormalities of gait and mobility: Secondary | ICD-10-CM

## 2022-04-21 DIAGNOSIS — M6281 Muscle weakness (generalized): Secondary | ICD-10-CM | POA: Diagnosis not present

## 2022-04-21 DIAGNOSIS — M542 Cervicalgia: Secondary | ICD-10-CM | POA: Diagnosis not present

## 2022-04-21 NOTE — Therapy (Signed)
OUTPATIENT PHYSICAL THERAPY TREATMENT NOTE        Patient Name: April [name] MRN: [id number] DOB:15-Jul-1941, 81 y.o., female Today's Date: 04/21/2022  PCP: Kathyrn Lass, MD REFERRING PROVIDER: Kathyrn Lass, MD  END OF SESSION:   PT End of Session - 04/21/22 0852     Visit Number 10    Number of Visits 13    Date for PT Re-Evaluation 05/03/22    Authorization Type [employer] MCR    Authorization Time Period 03/15/22-04/29/22; 12 visits    Authorization - Visit Number 10    Authorization - Number of Visits 12    PT Start Time 0848    PT Stop Time 0930    PT Time Calculation (min) 42 min                Past Medical History:  Diagnosis Date   Allergic rhinitis    Diabetes mellitus    TYPE 2   Dyslipidemia    Hyperkalemia    Hypertension    Insomnia    Pt stated no trouble falling or staying asleep.   RA (rheumatoid arthritis) ([place])    Past Surgical History:  Procedure Laterality Date   BREAST CYST EXCISION Right 1967   HAND TENDON SURGERY     Right   OVARIAN CYST SURGERY Right 1988   Patient Active Problem List   Diagnosis Date Noted   Iron deficiency anemia 01/20/2019   Encounter for long-term (current) use of other medications 02/29/2012   Hypertension    Diabetes mellitus    Hypokalemia    RA (rheumatoid arthritis) ([place])    EDEMA 08/27/2010   CARPAL TUNNEL SYNDROME, BILATERAL 10/28/2009   NUMBNESS 10/28/2009   Diabetes mellitus without complication ([place]) 32/44/0102   Hyperlipidemia 09/19/2007   ALLERGIC RHINITIS 09/19/2007   INSOMNIA 09/19/2007    PCP: Kathyrn Lass, MD   REFERRING PROVIDER: Kathyrn Lass, MD   REFERRING DIAG: Cervicalgia [M54.2]  THERAPY DIAG:  Cervicalgia  Muscle weakness (generalized)  Other muscle spasm  Other abnormalities of gait and mobility  PERTINENT HISTORY: DM, HTN   PRECAUTIONS: None   SUBJECTIVE:"   PAIN:  Are you having pain? Yes: NPRS scale: 7/10 Pain location: R shoulder / upper  trap Pain description: sore Aggravating factors: quick / sudden motions, looking to the R Relieving factors: neck        OBJECTIVE:  *Unless otherwise noted by date, all objective measures were captured on initial evaluation.    DIAGNOSTIC FINDINGS:  Nothing recent   PATIENT SURVEYS:  FOTO 34%, predicted 54% FOTO 47% status 04/12/22 7th visit     COGNITION: Overall cognitive status: Within functional limits for tasks assessed     SENSATION: WFL   POSTURE:  Forward head positioning   PALPATION: TTP along the R SCM, upper trap, levator scapuale, cervical parapsinals with multiple trigger points noted.      CERVICAL ROM:    Active ROM A/PROM (deg) 03/15/2022 03/29/22 04/19/2022  Flexion 30 ERP  48  Extension 28 ERP  36  Right lateral flexion 5  10  Left lateral flexion 5  10  Right rotation 27 40 55  Left rotation 31 40 58   (Blank rows = not tested)   UE ROM:   Active ROM Right 03/15/2022 Left 03/15/2022  Shoulder flexion      Shoulder extension      Shoulder abduction      Shoulder adduction      Shoulder extension  Shoulder internal rotation      Shoulder external rotation      Elbow flexion      Elbow extension      Wrist flexion      Wrist extension      Wrist ulnar deviation      Wrist radial deviation      Wrist pronation      Wrist supination       (Blank rows = not tested)   UE MMT:   MMT Right 03/15/2022 Left 03/15/2022 Right 04/19/2022 Left 04/19/2022  Shoulder flexion     4/5 4/5  Shoulder extension     4+/5 4/5  Shoulder abduction     4-/5 4-/5  Shoulder adduction        Shoulder extension        Shoulder internal rotation     4/5 4-/5  Shoulder external rotation     4/5 4-/5  Middle trapezius        Lower trapezius        Elbow flexion        Elbow extension        Wrist flexion        Wrist extension        Wrist ulnar deviation        Wrist radial deviation        Wrist pronation        Wrist supination        Grip  strength         (Blank rows = not tested)   CERVICAL SPECIAL TESTS:  N/A     FUNCTIONAL TESTS:  N/A     TODAY'S TREATMENT:  [facility] Adult PT Treatment:                                                DATE: 04/21/2022 Therapeutic Exercise: Nustep L3 x 10 min Double scapular retraction with ER 2 x 10 with RTB seated Overhead press 2 x 10 1# each Seated bicep curls 1# 10 x 2  Seated should rolls and scap squeezes Seated trunk rotations x10 Upper trap stretch bil 2 x 30 sec Seated chin tuck 5 sec x 10  Upper trap stretch Levator stretch  Cervical rotation with 3 fingers under chin    [facility] Adult PT Treatment:                                                DATE: 04/19/2022 Therapeutic Exercise: Upper trap stretch 2 x 30 seconds bil MAI chin tuck 1 x 10 neutral, 1 x 10 rotated to the R, 1 x 10 rotated to the L Seated upper cervical rotation 1 x 10  Updated HEP for upper cervical mobility  Manual Therapy: MTPR along the upper trap on the R and how it can be done at home and tools that can be used to assist with treatment.  Sub-occipital release  Cervical C3-C7 lateral gapping End range PROM cervical rotation with gentle oscillations for pain  Therapeutic Activity: Reviewed pt progress with cerivcal mobility, as well as tenderness related to her muscle spasm. Effects of weakness in the bil UE especially her L shoulder.    [facility] Adult PT Treatment:  DATE: 04/14/2022 Therapeutic Exercise: UBE L3 x 6 min (FWD/BWD x 3 min ea) Upper trap stretch bil 2 x 30 sec Seated chin tuck (pressing head into pillow behind head) 1 x 10 holding 5 seconds Double scapular retraction with ER 2 x 10 with RTB seated Overhead seated military press 2 x 10 , first set with no weight, 2nd set with 1# bil  Reviewed and updated HEP today for money and Glass blower/designer press Manual Therapy: MTPR along the B upper trap/ levator scapulae and scalene DTM /  ptrissage along bil upper trap IASTM along bil upper trap   [facility] Adult PT Treatment:                                                DATE:04/12/2022 Therapeutic Exercise: Nu-step L5 x 10 min UE/LE-  Double ER with scapular retraction 3 x 10 with RTB Standing cane pullovers to 90 degrees x 15 - cues to prevent shoulder hike.  UT stretches 10 sec x 3 each Levator stretches 10 sec x 3 each Supine chin tuck into towel roll 1 x 10 holding 5 seconds -  Supine chin tuck with head lift 5 sec x 10  Modalities:  HMP x 15 min cervical   PATIENT EDUCATION:  Education details: FOTO status  Person educated: Patient Education method: Explanation, Verbal cues, and Handouts Education comprehension: verbalized understanding     HOME EXERCISE PROGRAM: Access Code: [email or web address] URL: [email or web address] Date: 04/19/2022 Prepared by: Starr Lake  Exercises - Standing Cervical Retraction  - 1 x daily - 7 x weekly - 2 sets - 10 reps - Supine Chin Tuck with Towel  - 1 x daily - 7 x weekly - 2 sets - 10 reps - 5 hold - Seated Upper Trapezius Stretch  - 1 x daily - 7 x weekly - 2 sets - 2 reps - 30 seconds hold - Gentle Levator Scapulae Stretch  - 1 x daily - 7 x weekly - 2 sets - 2 reps - 30 seconds hold - Seated Scapular Retraction  - 1 x daily - 7 x weekly - 2 sets - 10 reps - 5 seconds hold - Seated Thoracic Rotation  - 1 x daily - 7 x weekly - 2 sets - 10 reps - 2 hold - Scapular retraction with ER (MONEY)  - 1 x daily - 7 x weekly - 2 sets - 10 reps - Seated Overhead Press  - 1 x daily - 7 x weekly - 2 sets - 10 reps - 3 Finger Cervical Rotation  - 1 x daily - 7 x weekly - 2 sets - 10 reps ASSESSMENT:   CLINICAL IMPRESSION: Mrs Bresee continues to endorse 7/10 pain and limitations with ADLS. Continued with strength and ROM  per PT POC. She reports limited HEP compliance.   She would benefit from physical therapy to reduce pain and muscle tension, improve gross UE/  posterior chain strength and maximize her function by addressing the deficits listed.      OBJECTIVE IMPAIRMENTS decreased activity tolerance, decreased endurance, decreased ROM, decreased strength, increased fascial restrictions, increased muscle spasms, postural dysfunction, and pain.    ACTIVITY LIMITATIONS cleaning and community activity.    PERSONAL FACTORS Age and 1 comorbidity: DM  are also affecting patient's functional outcome.      REHAB POTENTIAL: Good   CLINICAL  DECISION MAKING: Evolving/moderate complexity   EVALUATION COMPLEXITY: Moderate     GOALS: Goals reviewed with patient? Yes   SHORT TERM GOALS: Target date: 04/05/2022   Pt to be IND with initial HEP for therapeutic progression Baseline: no previous HEP Goal status: MET 04/05/22   2.  Reduce muscle spasm in the upper trap and surrounding musculature to promote cervical mobility and reduce max pain to </= 5/10 Baseline: current pain 8/10 with increased spasm in the R upper trap Status: 5/10 rating last 2 visits with pain increased when colder weather 04/05/22 Goal status: ONGOING 04/19/2022   3.  Increase cervical AROM by >/= 5 degrees in all planes to promote cervical mobility Baseline: see table Status: improved cervical rotation- see table Goal status: MET 04/19/2022   LONG TERM GOALS: Target date: 04/26/2022   Increase Cervical flexion to >/= 40 degrees, extension to >/= 35 degrees, and increase rotation/ sidebenidng bil to >/= 60 degrees for safety with ADLs and driving with </= 8/25 max pain Baseline: see table Goal status: MET 04/19/2022   2.  Increase bil shoulder strength to >/= 4/5 to promote stability and lifting required for ADLS Baseline: unable to test strength Goal status: ONGOING 04/19/2022   3.  Increase FOTO score to >/=54% to demo improvement in function Baseline: initial score 34% Goal status: ONGOING 04/19/2022   4.  Pt to be able to return to her home exercise routine per her personal  goals with no report of limtations Baseline: unable to perform due to pain Goal status: PARTIALLY MET 04/19/2022   5.  Pt to be IND with all HEP to maintain and progress current LOF IND Baseline: no previous HEP Goal status: ONGOING 04/19/2022       PLAN: PT FREQUENCY: 1-2x/week   PT DURATION: 6 weeks   PLANNED INTERVENTIONS: Therapeutic exercises, Therapeutic activity, Neuromuscular re-education, Balance training, Gait training, Patient/Family education, Joint mobilization, Dry Needling, Spinal manipulation, Cryotherapy, Moist heat, Taping, Traction, Ionotophoresis 67m/ml Dexamethasone, and Manual therapy   PLAN FOR NEXT SESSION: . Review/ update HEP PRN, STW along upper trap/levator, PROM / AAROM cervical mobility, general shoulder strength/ scapular stability.    JHessie Diener PTA 04/21/22 9:31 AM Phone: [phone]: [phone]

## 2022-04-26 ENCOUNTER — Encounter: Payer: Self-pay | Admitting: Physical Therapy

## 2022-04-26 ENCOUNTER — Ambulatory Visit: Payer: Medicare HMO | Admitting: Physical Therapy

## 2022-04-26 DIAGNOSIS — M62838 Other muscle spasm: Secondary | ICD-10-CM

## 2022-04-26 DIAGNOSIS — M6281 Muscle weakness (generalized): Secondary | ICD-10-CM | POA: Diagnosis not present

## 2022-04-26 DIAGNOSIS — M542 Cervicalgia: Secondary | ICD-10-CM

## 2022-04-26 DIAGNOSIS — R2689 Other abnormalities of gait and mobility: Secondary | ICD-10-CM | POA: Diagnosis not present

## 2022-04-26 NOTE — Therapy (Signed)
OUTPATIENT PHYSICAL THERAPY TREATMENT NOTE / DISCHARGE        Patient Name: April [name] MRN: [id number] DOB:July 06, 1941, 81 y.o., female Today's Date: 04/26/2022  PCP: Kathyrn Lass, MD REFERRING PROVIDER: Kathyrn Lass, MD  END OF SESSION:   PT End of Session - 04/26/22 0933     Visit Number 11    Number of Visits 13    Date for PT Re-Evaluation 05/03/22    Authorization Type [employer] MCR    Authorization Time Period 03/15/22-04/29/22; 12 visits    Authorization - Visit Number 11    Authorization - Number of Visits 12    PT Start Time 0927    PT Stop Time 1005    PT Time Calculation (min) 38 min    Activity Tolerance Patient tolerated treatment well    Behavior During Therapy WFL for tasks assessed/performed                 Past Medical History:  Diagnosis Date   Allergic rhinitis    Diabetes mellitus    TYPE 2   Dyslipidemia    Hyperkalemia    Hypertension    Insomnia    Pt stated no trouble falling or staying asleep.   RA (rheumatoid arthritis) ([place])    Past Surgical History:  Procedure Laterality Date   BREAST CYST EXCISION Right 1967   HAND TENDON SURGERY     Right   OVARIAN CYST SURGERY Right 1988   Patient Active Problem List   Diagnosis Date Noted   Iron deficiency anemia 01/20/2019   Encounter for long-term (current) use of other medications 02/29/2012   Hypertension    Diabetes mellitus    Hypokalemia    RA (rheumatoid arthritis) ([place])    EDEMA 08/27/2010   CARPAL TUNNEL SYNDROME, BILATERAL 10/28/2009   NUMBNESS 10/28/2009   Diabetes mellitus without complication ([place]) 13/24/4010   Hyperlipidemia 09/19/2007   ALLERGIC RHINITIS 09/19/2007   INSOMNIA 09/19/2007    PCP: Kathyrn Lass, MD   REFERRING PROVIDER: Kathyrn Lass, MD   REFERRING DIAG: Cervicalgia [M54.2]  THERAPY DIAG:  Cervicalgia  Muscle weakness (generalized)  Other muscle spasm  PERTINENT HISTORY: DM, HTN   PRECAUTIONS: None   SUBJECTIVE: "today I am  feeling better than I did the other day and over the weekend."  PAIN:  Are you having pain? Yes: NPRS scale: 5/10 Pain location: R shoulder / upper trap Pain description: sore Aggravating factors: quick / sudden motions, looking to the R Relieving factors: neck        OBJECTIVE:  *Unless otherwise noted by date, all objective measures were captured on initial evaluation.    DIAGNOSTIC FINDINGS:  Nothing recent   PATIENT SURVEYS:  FOTO 34%, predicted 54% FOTO 47% status 04/12/22 7th visit FOTO 40% Status 04/26/22 11th visit    COGNITION: Overall cognitive status: Within functional limits for tasks assessed     SENSATION: WFL   POSTURE:  Forward head positioning   PALPATION: TTP along the R SCM, upper trap, levator scapuale, cervical parapsinals with multiple trigger points noted.      CERVICAL ROM:    Active ROM A/PROM (deg) 03/15/2022 03/29/22 04/19/2022  Flexion 30 ERP  48  Extension 28 ERP  36  Right lateral flexion 5  10  Left lateral flexion 5  10  Right rotation 27 40 55  Left rotation 31 40 58   (Blank rows = not tested)   UE ROM:   Active ROM Right 03/15/2022 Left 03/15/2022  Shoulder flexion      Shoulder extension      Shoulder abduction      Shoulder adduction      Shoulder extension      Shoulder internal rotation      Shoulder external rotation      Elbow flexion      Elbow extension      Wrist flexion      Wrist extension      Wrist ulnar deviation      Wrist radial deviation      Wrist pronation      Wrist supination       (Blank rows = not tested)   UE MMT:   MMT Right 03/15/2022 Left 03/15/2022 Right 04/19/2022 Left 04/19/2022  Shoulder flexion     4/5 4/5  Shoulder extension     4+/5 4/5  Shoulder abduction     4-/5 4-/5  Shoulder adduction        Shoulder extension        Shoulder internal rotation     4/5 4-/5  Shoulder external rotation     4/5 4-/5  Middle trapezius        Lower trapezius        Elbow flexion         Elbow extension        Wrist flexion        Wrist extension        Wrist ulnar deviation        Wrist radial deviation        Wrist pronation        Wrist supination        Grip strength         (Blank rows = not tested)   CERVICAL SPECIAL TESTS:  N/A     FUNCTIONAL TESTS:  N/A     TODAY'S TREATMENT:   [facility] Adult PT Treatment:                                                DATE: 5/23/5 Therapeutic Exercise: Upper trap stretch 2 x 30 second Seated chin tuck 1 x 5 holding 5 seconds Scapular retraction 1 x 10 Money 1 x 5 with GTB  Extensively reviewed HEP    [facility] Adult PT Treatment:                                                DATE: 04/21/2022 Therapeutic Exercise: Nustep L3 x 10 min Double scapular retraction with ER 2 x 10 with RTB seated Overhead press 2 x 10 1# each Seated bicep curls 1# 10 x 2  Seated should rolls and scap squeezes Seated trunk rotations x10 Upper trap stretch bil 2 x 30 sec Seated chin tuck 5 sec x 10  Upper trap stretch Levator stretch  Cervical rotation with 3 fingers under chin    [facility] Adult PT Treatment:                                                DATE: 04/19/2022 Therapeutic Exercise: Upper  trap stretch 2 x 30 seconds bil MAI chin tuck 1 x 10 neutral, 1 x 10 rotated to the R, 1 x 10 rotated to the L Seated upper cervical rotation 1 x 10  Updated HEP for upper cervical mobility  Manual Therapy: MTPR along the upper trap on the R and how it can be done at home and tools that can be used to assist with treatment.  Sub-occipital release  Cervical C3-C7 lateral gapping End range PROM cervical rotation with gentle oscillations for pain  Therapeutic Activity: Reviewed pt progress with cerivcal mobility, as well as tenderness related to her muscle spasm. Effects of weakness in the bil UE especially her L shoulder.    PATIENT EDUCATION:  Education details: Reviewed HEP and importance of consistency with her HEP. Reviewed gradual  walking building your time over the course of a couple of weeks.  Person educated: Patient Education method: Explanation, Verbal cues, and Handouts Education comprehension: verbalized understanding     HOME EXERCISE PROGRAM: Access Code: [email or web address] URL: [email or web address] Date: 04/19/2022 Prepared by: Starr Lake  Exercises - Standing Cervical Retraction  - 1 x daily - 7 x weekly - 2 sets - 10 reps - Supine Chin Tuck with Towel  - 1 x daily - 7 x weekly - 2 sets - 10 reps - 5 hold - Seated Upper Trapezius Stretch  - 1 x daily - 7 x weekly - 2 sets - 2 reps - 30 seconds hold - Gentle Levator Scapulae Stretch  - 1 x daily - 7 x weekly - 2 sets - 2 reps - 30 seconds hold - Seated Scapular Retraction  - 1 x daily - 7 x weekly - 2 sets - 10 reps - 5 seconds hold - Seated Thoracic Rotation  - 1 x daily - 7 x weekly - 2 sets - 10 reps - 2 hold - Scapular retraction with ER (MONEY)  - 1 x daily - 7 x weekly - 2 sets - 10 reps - Seated Overhead Press  - 1 x daily - 7 x weekly - 2 sets - 10 reps - 3 Finger Cervical Rotation  - 1 x daily - 7 x weekly - 2 sets - 10 reps  ASSESSMENT:   CLINICAL IMPRESSION: Pt arrives to session stating she feels better today than over the last few days. She continues to demonstrating fluctuating report of pain. She has improved her neck ROM and reports her pain as fluctuating but continues to have pain in multiple joints. Her FOTO score has regressed 7 points compared to last assessment which appears to be impacted by other joints. She met or partially met all goals except for LTG #2 & #3. At this point pt appears to have plateau in her functional progression, however continues to have elevated pain as well. She is able to maintain and progress her current LOF and will formerly be discharged from PT today.      OBJECTIVE IMPAIRMENTS decreased activity tolerance, decreased endurance, decreased ROM, decreased strength, increased fascial  restrictions, increased muscle spasms, postural dysfunction, and pain.    ACTIVITY LIMITATIONS cleaning and community activity.    PERSONAL FACTORS Age and 1 comorbidity: DM  are also affecting patient's functional outcome.      REHAB POTENTIAL: Good   CLINICAL DECISION MAKING: Evolving/moderate complexity   EVALUATION COMPLEXITY: Moderate     GOALS: Goals reviewed with patient? Yes   SHORT TERM GOALS: Target date: 04/05/2022   Pt to be IND with initial HEP  for therapeutic progression Baseline: no previous HEP Goal status: MET 04/05/22   2.  Reduce muscle spasm in the upper trap and surrounding musculature to promote cervical mobility and reduce max pain to </= 5/10 Baseline: current pain 8/10 with increased spasm in the R upper trap Status: 5/10 rating last 2 visits with pain increased when colder weather 04/05/22 Goal status: ONGOING 04/19/2022   3.  Increase cervical AROM by >/= 5 degrees in all planes to promote cervical mobility Baseline: see table Status: improved cervical rotation- see table Goal status: MET 04/19/2022   LONG TERM GOALS: Target date: 04/26/2022   Increase Cervical flexion to >/= 40 degrees, extension to >/= 35 degrees, and increase rotation/ sidebenidng bil to >/= 60 degrees for safety with ADLs and driving with </= 8/64 max pain Baseline: see table Goal status: MET 04/19/2022   2.  Increase bil shoulder strength to >/= 4/5 to promote stability and lifting required for ADLS Baseline: unable to test strength Goal status: NOT MET 04/26/2022   3.  Increase FOTO score to >/=54% to demo improvement in function Baseline: initial score 34% Goal status: NOT MET 04/26/2022   4.  Pt to be able to return to her home exercise routine per her personal goals with no report of limtations Baseline: unable to perform due to pain Goal status: PARTIALLY MET 04/26/2022   5.  Pt to be IND with all HEP to maintain and progress current LOF IND Baseline: no previous HEP Goal  status: MET 04/26/2022       PLAN: PT FREQUENCY: 1-2x/week   PT DURATION: 6 weeks   PLANNED INTERVENTIONS: Therapeutic exercises, Therapeutic activity, Neuromuscular re-education, Balance training, Gait training, Patient/Family education, Joint mobilization, Dry Needling, Spinal manipulation, Cryotherapy, Moist heat, Taping, Traction, Ionotophoresis 82m/ml Dexamethasone, and Manual therapy   PLAN FOR NEXT SESSION: . Review/ update HEP PRN, STW along upper trap/levator, PROM / AAROM cervical mobility, general shoulder strength/ scapular stability.    Demone Lyles PT, DPT, LAT, ATC  04/26/22  10:14 AM     PHYSICAL THERAPY DISCHARGE SUMMARY  Visits from Start of Care: 11  Current functional level related to goals / functional outcomes: See goals, FOTO 40%   Remaining deficits: See assessment   Education / Equipment: HEP, theraband, posture, lifting mechanics.    Patient agrees to discharge. Patient goals were partially met. Patient is being discharged due to lack of progress.    Bronx Brogden PT, DPT, LAT, ATC  04/26/22  10:14 AM

## 2022-04-28 ENCOUNTER — Encounter: Payer: Medicare HMO | Admitting: Physical Therapy

## 2022-05-03 DIAGNOSIS — M47817 Spondylosis without myelopathy or radiculopathy, lumbosacral region: Secondary | ICD-10-CM | POA: Diagnosis not present

## 2022-06-09 DIAGNOSIS — Z79899 Other long term (current) drug therapy: Secondary | ICD-10-CM | POA: Diagnosis not present

## 2022-06-09 DIAGNOSIS — N289 Disorder of kidney and ureter, unspecified: Secondary | ICD-10-CM | POA: Diagnosis not present

## 2022-06-09 DIAGNOSIS — M0589 Other rheumatoid arthritis with rheumatoid factor of multiple sites: Secondary | ICD-10-CM | POA: Diagnosis not present

## 2022-06-09 DIAGNOSIS — M150 Primary generalized (osteo)arthritis: Secondary | ICD-10-CM | POA: Diagnosis not present

## 2022-06-09 DIAGNOSIS — M7551 Bursitis of right shoulder: Secondary | ICD-10-CM | POA: Diagnosis not present

## 2022-06-17 DIAGNOSIS — M542 Cervicalgia: Secondary | ICD-10-CM | POA: Diagnosis not present

## 2022-06-17 DIAGNOSIS — M25511 Pain in right shoulder: Secondary | ICD-10-CM | POA: Diagnosis not present

## 2022-06-17 DIAGNOSIS — R197 Diarrhea, unspecified: Secondary | ICD-10-CM | POA: Diagnosis not present

## 2022-06-22 DIAGNOSIS — R197 Diarrhea, unspecified: Secondary | ICD-10-CM | POA: Diagnosis not present

## 2022-07-05 ENCOUNTER — Telehealth: Payer: Self-pay

## 2022-07-05 DIAGNOSIS — E119 Type 2 diabetes mellitus without complications: Secondary | ICD-10-CM

## 2022-07-05 NOTE — Telephone Encounter (Signed)
Patient called in states that she sent to see her PCP. They discussed her issue of having diarrhea which patient states she has had for 2 years. Dr took her off of metformin for two weeks to see if it was the medication and it was. Patient has not had diarrhea since she has been off of medication. Dr Hyacinth Meeker advised her to call and see if you could put her on another medication.

## 2022-07-06 MED ORDER — METFORMIN HCL ER 500 MG PO TB24: ORAL_TABLET | ORAL | 2 refills | Status: DC

## 2022-07-06 NOTE — Addendum Note (Signed)
Addended by: Eliseo Squires on: 07/06/2022 09:16 AM   Modules accepted: Orders

## 2022-07-09 ENCOUNTER — Other Ambulatory Visit: Payer: Self-pay | Admitting: Endocrinology

## 2022-08-20 DIAGNOSIS — M47817 Spondylosis without myelopathy or radiculopathy, lumbosacral region: Secondary | ICD-10-CM | POA: Diagnosis not present

## 2022-08-20 DIAGNOSIS — M542 Cervicalgia: Secondary | ICD-10-CM | POA: Diagnosis not present

## 2022-08-23 ENCOUNTER — Other Ambulatory Visit (INDEPENDENT_AMBULATORY_CARE_PROVIDER_SITE_OTHER): Payer: Medicare HMO

## 2022-08-23 DIAGNOSIS — E119 Type 2 diabetes mellitus without complications: Secondary | ICD-10-CM

## 2022-08-23 DIAGNOSIS — E7800 Pure hypercholesterolemia, unspecified: Secondary | ICD-10-CM

## 2022-08-23 LAB — LIPID PANEL
Cholesterol: 150 mg/dL (ref 0–200)
HDL: 80.6 mg/dL (ref >39.00)
LDL Cholesterol: 62 mg/dL (ref 0–99)
NonHDL: 69.52
Total CHOL/HDL Ratio: 2
Triglycerides: 36 mg/dL (ref 0.0–149.0)
VLDL: 7.2 mg/dL (ref 0.0–40.0)

## 2022-08-23 LAB — COMPREHENSIVE METABOLIC PANEL
ALT: 23 U/L (ref 0–35)
AST: 26 U/L (ref 0–37)
Albumin: 4.1 g/dL (ref 3.5–5.2)
Alkaline Phosphatase: 89 U/L (ref 39–117)
BUN: 22 mg/dL (ref 6–23)
CO2: 27 mEq/L (ref 19–32)
Calcium: 9.6 mg/dL (ref 8.4–10.5)
Chloride: 107 mEq/L (ref 96–112)
Creatinine, Ser: 0.99 mg/dL (ref 0.40–1.20)
GFR: 54.34 mL/min — ABNORMAL LOW (ref >60.00)
Glucose, Bld: 75 mg/dL (ref 70–99)
Potassium: 3.6 mEq/L (ref 3.5–5.1)
Sodium: 142 mEq/L (ref 135–145)
Total Bilirubin: 0.4 mg/dL (ref 0.2–1.2)
Total Protein: 7.2 g/dL (ref 6.0–8.3)

## 2022-08-23 LAB — HEMOGLOBIN A1C: Hgb A1c MFr Bld: 5.9 % (ref 4.6–6.5)

## 2022-08-25 ENCOUNTER — Ambulatory Visit: Payer: Medicare HMO | Admitting: Endocrinology

## 2022-08-29 ENCOUNTER — Ambulatory Visit: Payer: Medicare HMO | Admitting: Endocrinology

## 2022-08-29 ENCOUNTER — Encounter: Payer: Self-pay | Admitting: Endocrinology

## 2022-08-29 VITALS — BP 138/70 | HR 65 | Ht 64.5 in | Wt 170.0 lb

## 2022-08-29 DIAGNOSIS — E119 Type 2 diabetes mellitus without complications: Secondary | ICD-10-CM | POA: Diagnosis not present

## 2022-08-29 DIAGNOSIS — I10 Essential (primary) hypertension: Secondary | ICD-10-CM

## 2022-08-29 DIAGNOSIS — E7800 Pure hypercholesterolemia, unspecified: Secondary | ICD-10-CM | POA: Diagnosis not present

## 2022-08-29 NOTE — Progress Notes (Signed)
Patient ID: April [name], female   DOB: September 18, 1941, 81 y.o.   MRN: [id number]   Reason for Appointment: follow-up of various problems  History of Present Illness    HYPERTENSION:  diagnosed around 1979 with symptoms of headaches Previously she was taking Avapro, clonidine 0.6 at bedtime and Dyazide but blood pressure was relatively high with this regimen  For evaluation of her hyperaldosteronism she was switched to labetalol 100 mg twice a day, Tenex 2 mg and doxazosin With this regimen her blood pressure was much better She was subsequently started on Aldactone to help with her severe hypokalemia and doxazosin and guanfacine were stopped  CURRENT regimen of Aldactone 50 mg daily, labetalol 100 mg twice a day   Since her renal function was impaired in April 2022 she was told to reduce her Aldactone to half of the 100 mg tablet Previously also on HCTZ On the last visit she was told to take only half a tablet of the labetalol but she forgets and is still taking the whole tablet, blood pressure is not as low now  No lightheadedness  She is checking blood pressure at home with a [employer] meter This is reportedly normal Previously accurate in the office     BP Readings from Last 3 Encounters:  08/29/22 138/70  04/20/22 118/64  12/15/21 (!) 118/58   RENAL function as follows:   Lab Results  Component Value Date   CREATININE 0.99 08/23/2022   CREATININE 1.16 04/18/2022   CREATININE 1.01 12/13/2021     HYPOKALEMIA:   This previously had been a significant longstanding problem and associated with hypertension  Previously had  been prescribed 6 tablets of potassium daily She was off her diuretics and Avapro when her evaluation for hyperaldosteronism was done, aldosterone level was 6.0 along with a relatively low renin level  She has a normal potassium level consistently with Aldactone, taking 50 mg daily along with 1 tablet of potassium 20 mEq daily  Lab  Results  Component Value Date   K 3.6 08/23/2022     DIABETES type II:   She has had diabetes since at least 2015 This has been  well controlled with a regimen of metformin 1 g twice a day   A1c has been consistently upper normal, now 5.9  She is using a One Touch ultra meter  She takes Metformin 0.5 g twice a day regularly with meals She has consistently good control with monotherapy For the last 2 years has been having diarrhea but this is now better with reducing her metformin to half the dose and using extended release  Recent blood sugars range 90s in ams, not checking after meals  Because of her back and joint pains she cannot exercise but is trying to walk a little now She is she is not changing her diet and eating vegetables regularly  Weight is up 10 pounds  Weight history:   Wt Readings from Last 3 Encounters:  08/29/22 170 lb (77.1 kg)  04/20/22 160 lb 3.2 oz (72.7 kg)  12/15/21 156 lb 9.6 oz (71 kg)      Lab Results  Component Value Date   HGBA1C 5.9 08/23/2022   HGBA1C 5.8 04/18/2022   HGBA1C 6.0 12/13/2021   Lab Results  Component Value Date   MICROALBUR 4.5 (H) 04/18/2022   LDLCALC 62 08/23/2022   CREATININE 0.99 08/23/2022    OTHER active problems discussed today are in review of systems  LABS:  Lab on  08/23/2022  Component Date Value Ref Range Status   Cholesterol 08/23/2022 150  0 - 200 mg/dL Final   ATP III Classification       Desirable:  < 200 mg/dL               Borderline High:  200 - 239 mg/dL          High:  > = 240 mg/dL   Triglycerides 08/23/2022 36.0  0.0 - 149.0 mg/dL Final   Normal:  <150 mg/dLBorderline High:  150 - 199 mg/dL   HDL 08/23/2022 80.60  >39.00 mg/dL Final   VLDL 08/23/2022 7.2  0.0 - 40.0 mg/dL Final   LDL Cholesterol 08/23/2022 62  0 - 99 mg/dL Final   Total CHOL/HDL Ratio 08/23/2022 2   Final                  Men          Women1/2 Average Risk     3.4          3.3Average Risk          5.0          4.42X  Average Risk          9.6          7.13X Average Risk          15.0          11.0                       NonHDL 08/23/2022 69.52   Final   NOTE:  Non-HDL goal should be 30 mg/dL higher than patient's LDL goal (i.e. LDL goal of < 70 mg/dL, would have non-HDL goal of < 100 mg/dL)   Sodium 08/23/2022 142  135 - 145 mEq/L Final   Potassium 08/23/2022 3.6  3.5 - 5.1 mEq/L Final   Chloride 08/23/2022 107  96 - 112 mEq/L Final   CO2 08/23/2022 27  19 - 32 mEq/L Final   Glucose, Bld 08/23/2022 75  70 - 99 mg/dL Final   BUN 08/23/2022 22  6 - 23 mg/dL Final   Creatinine, Ser 08/23/2022 0.99  0.40 - 1.20 mg/dL Final   Total Bilirubin 08/23/2022 0.4  0.2 - 1.2 mg/dL Final   Alkaline Phosphatase 08/23/2022 89  39 - 117 U/L Final   AST 08/23/2022 26  0 - 37 U/L Final   ALT 08/23/2022 23  0 - 35 U/L Final   Total Protein 08/23/2022 7.2  6.0 - 8.3 g/dL Final   Albumin 08/23/2022 4.1  3.5 - 5.2 g/dL Final   GFR 08/23/2022 54.34 (L)  >60.00 mL/min Final   Calculated using the CKD-EPI Creatinine Equation (2021)   Calcium 08/23/2022 9.6  8.4 - 10.5 mg/dL Final   Hgb A1c MFr Bld 08/23/2022 5.9  4.6 - 6.5 % Final   Glycemic Control Guidelines for People with Diabetes:Non Diabetic:  <6%Goal of Therapy: <7%Additional Action Suggested:  >8%     Allergies as of 08/29/2022       Reactions   Acetaminophen Nausea Only   Actos [pioglitazone Hydrochloride]    edema   Aspirin Nausea Only   Atorvastatin Other (See Comments)   Felt drunk/high floating   Morphine Sulfate Other (See Comments)   Burns injecting "arm is on fire"   Naproxen Sodium Nausea Only   Sitagliptin Phosphate Nausea And Vomiting        Medication List  Accurate as of August 29, 2022  9:20 AM. If you have any questions, ask your nurse or doctor.          STOP taking these medications    leflunomide 10 MG tablet Commonly known as: ARAVA Stopped by: Elayne Snare, MD   methotrexate 2.5 MG tablet Commonly known as:  RHEUMATREX Stopped by: Elayne Snare, MD       TAKE these medications    Accu-Chek Guide w/Device Kit Use to test blood sugar once daily   Accu-Chek Softclix Lancets lancets Use to check blood sugar once daily   Co Q 10 100 MG Caps Take by mouth daily.   cyanocobalamin 1000 MCG tablet Commonly known as: VITAMIN B12 Take 1,000 mcg by mouth daily.   ezetimibe 10 MG tablet Commonly known as: ZETIA Take 10 mg by mouth daily.   fluticasone 50 MCG/ACT nasal spray Commonly known as: FLONASE 1 spray in each nostril   folic acid 0.5 MG tablet Commonly known as: FOLVITE Take 0.5 mg by mouth daily.   gabapentin 300 MG capsule Commonly known as: NEURONTIN Take 300 mg by mouth daily.   Ginger 500 MG Caps Take 1 capsule by mouth daily.   labetalol 100 MG tablet Commonly known as: NORMODYNE TAKE 1 TABLET BY MOUTH TWICE A DAY   metFORMIN 500 MG 24 hr tablet Commonly known as: GLUCOPHAGE-XR Take 1 tablet once a day for 7 days, then 1 tablet twice a day. What changed: Another medication with the same name was removed. Continue taking this medication, and follow the directions you see here. Changed by: Elayne Snare, MD   montelukast 10 MG tablet Commonly known as: SINGULAIR Take 10 mg by mouth daily.   OneTouch Ultra test strip Generic drug: glucose blood USE AS DIRECTED TO CHECK BLOOD SUGAR ONCE DAILY   Accu-Chek Guide test strip Generic drug: glucose blood Use to check blood sugar once a day   potassium chloride SA 20 MEQ tablet Commonly known as: Klor-Con M20 TAKE 0.5 TABLET BY MOUTH ONCE DAILY   pravastatin 80 MG tablet Commonly known as: PRAVACHOL TAKE 1 TABLET BY MOUTH EVERY DAY   spironolactone 100 MG tablet Commonly known as: ALDACTONE TAKE 1 TABLET BY MOUTH EVERY DAY What changed: how much to take   tapentadol 50 MG tablet Commonly known as: NUCYNTA Take 50 mg by mouth as needed for severe pain (spasms).   Turmeric 450 MG Caps Take 1 capsule by mouth  daily.   ZYRTEC PO 1 tab        Allergies:  Allergies  Allergen Reactions   Acetaminophen Nausea Only   Actos [Pioglitazone Hydrochloride]     edema   Aspirin Nausea Only   Atorvastatin Other (See Comments)    Felt drunk/high floating   Morphine Sulfate Other (See Comments)    Burns injecting "arm is on fire"   Naproxen Sodium Nausea Only   Sitagliptin Phosphate Nausea And Vomiting    Past Medical History:  Diagnosis Date   Allergic rhinitis    Diabetes mellitus    TYPE 2   Dyslipidemia    Hyperkalemia    Hypertension    Insomnia    Pt stated no trouble falling or staying asleep.   RA (rheumatoid arthritis) ([place])     Past Surgical History:  Procedure Laterality Date   BREAST CYST EXCISION Right 1967   HAND TENDON SURGERY     Right   OVARIAN CYST SURGERY Right 1988    Family History  Problem Relation Age of Onset   Diabetes Mother    Diabetes Father    Cancer Father        Prostate   Diabetes Sister    Stroke Maternal Grandmother        Cause of death   Healthy Brother     Social History:  reports that she has never smoked. She has never used smokeless tobacco. She reports that she does not drink alcohol and does not use drugs.  Review of Systems:   History of rheumatoid arthritis followed by rheumatologist Also takes gabapentin for pain  Vitamin D deficiency: She is taking vitamin D3, 1000 U daily  LIPIDS: Taking Zetia from PCP with pravastatin 80 mg LDL improved with adding Zetia, last 67   Lab Results  Component Value Date   CHOL 150 08/23/2022   CHOL 155 12/13/2021   CHOL 162 10/15/2021   Lab Results  Component Value Date   HDL 80.60 08/23/2022   HDL 77.70 12/13/2021   HDL 82 10/15/2021   Lab Results  Component Value Date   LDLCALC 62 08/23/2022   LDLCALC 70 12/13/2021   LDLCALC 70 10/15/2021   Lab Results  Component Value Date   TRIG 36.0 08/23/2022   TRIG 40.0 12/13/2021   TRIG 48 10/15/2021   Lab Results  Component  Value Date   CHOLHDL 2 08/23/2022   CHOLHDL 2 12/13/2021   CHOLHDL 2.0 10/15/2021   Lab Results  Component Value Date   LDLDIRECT 85.0 10/09/2019    Lab Results  Component Value Date   ALT 23 08/23/2022   ALT 24 01/15/2020   ALT 23 09/11/2017      Examination:   BP 138/70   Pulse 65   Ht 5' 4.5" (1.638 m)   Wt 170 lb (77.1 kg)   SpO2 98%   BMI 28.73 kg/m   Body mass index is 28.73 kg/m.      Assesment/PLAN:  Hypertension:  Blood pressure is well controlled, previously was low normal  She is on labetalol 100 mg twice daily and Aldactone 50 mg Advised her to check regularly at home and call if getting low   Hypokalemia: Controlled with potassium supplementation along with Aldactone 50 mg daily Had renal insufficiency with higher doses of Aldactone  She will continue the same dose  DIABETES: Well controlled with Metformin ER 0.5 g twice daily A1c is excellent at 5.9 Tolerating lower dose of metformin better than regular metformin  Home blood sugars are not being checked after meals and discussed needing to do that more often than fasting She may have gained weight because of not having diarrhea However needs to monitor her weight at home Encouraged her to increase her walking as tolerated  LIPIDS: Excellent with LDL 62  Follow-up in 4 months   There are no Patient Instructions on file for this visit.   Elayne Snare 08/29/2022, 9:20 AM

## 2022-09-07 DIAGNOSIS — M5412 Radiculopathy, cervical region: Secondary | ICD-10-CM | POA: Diagnosis not present

## 2022-09-08 DIAGNOSIS — M5412 Radiculopathy, cervical region: Secondary | ICD-10-CM | POA: Diagnosis not present

## 2022-09-12 DIAGNOSIS — Z79899 Other long term (current) drug therapy: Secondary | ICD-10-CM | POA: Diagnosis not present

## 2022-09-12 DIAGNOSIS — M0589 Other rheumatoid arthritis with rheumatoid factor of multiple sites: Secondary | ICD-10-CM | POA: Diagnosis not present

## 2022-09-12 DIAGNOSIS — M150 Primary generalized (osteo)arthritis: Secondary | ICD-10-CM | POA: Diagnosis not present

## 2022-09-12 DIAGNOSIS — M542 Cervicalgia: Secondary | ICD-10-CM | POA: Diagnosis not present

## 2022-09-22 DIAGNOSIS — M5412 Radiculopathy, cervical region: Secondary | ICD-10-CM | POA: Diagnosis not present

## 2022-10-10 DIAGNOSIS — M5412 Radiculopathy, cervical region: Secondary | ICD-10-CM | POA: Diagnosis not present

## 2022-10-10 DIAGNOSIS — M542 Cervicalgia: Secondary | ICD-10-CM | POA: Diagnosis not present

## 2022-10-10 DIAGNOSIS — Z5181 Encounter for therapeutic drug level monitoring: Secondary | ICD-10-CM | POA: Diagnosis not present

## 2022-10-17 ENCOUNTER — Other Ambulatory Visit: Payer: Self-pay | Admitting: Endocrinology

## 2022-10-22 DIAGNOSIS — Z1231 Encounter for screening mammogram for malignant neoplasm of breast: Secondary | ICD-10-CM | POA: Diagnosis not present

## 2022-11-21 ENCOUNTER — Encounter: Payer: Self-pay | Admitting: Cardiovascular Disease

## 2022-11-21 ENCOUNTER — Ambulatory Visit: Payer: Medicare HMO | Attending: Cardiovascular Disease | Admitting: Cardiovascular Disease

## 2022-11-21 VITALS — BP 128/68 | HR 68 | Ht 64.0 in | Wt 168.0 lb

## 2022-11-21 DIAGNOSIS — I10 Essential (primary) hypertension: Secondary | ICD-10-CM

## 2022-11-21 DIAGNOSIS — E785 Hyperlipidemia, unspecified: Secondary | ICD-10-CM | POA: Diagnosis not present
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
## 2022-11-21 NOTE — Patient Instructions (Signed)
Medication Instructions:  Your physician recommends that you continue on your current medications as directed. Please refer to the Current Medication list given to you today.  *If you need a refill on your cardiac medications before your next appointment, please call your pharmacy*   Lab Work: NONE If you have labs (blood work) drawn today and your tests are completely normal, you will receive your results only by: MyChart Message (if you have MyChart) OR A paper copy in the mail If you have any lab test that is abnormal or we need to change your treatment, we will call you to review the results.   Testing/Procedures: NONE   Follow-Up: At [facility], you and your health needs are our priority.  As part of our continuing mission to provide you with exceptional heart care, we have created designated Provider Care Teams.  These Care Teams include your primary Cardiologist (physician) and Advanced Practice Providers (APPs -  Physician Assistants and Nurse Practitioners) who all work together to provide you with the care you need, when you need it.  We recommend signing up for the patient portal called "MyChart".  Sign up information is provided on this After Visit Summary.  MyChart is used to connect with patients for Virtual Visits (Telemedicine).  Patients are able to view lab/test results, encounter notes, upcoming appointments, etc.  Non-urgent messages can be sent to your provider as well.   To learn more about what you can do with MyChart, go to [email or web address].    Your next appointment:   1 year(s)  The format for your next appointment:   In Person  Provider:   Philip Nahser, MD       Important Information About Sugar       

## 2022-11-23 ENCOUNTER — Emergency Department (HOSPITAL_COMMUNITY): Payer: Medicare HMO

## 2022-11-23 ENCOUNTER — Emergency Department (HOSPITAL_COMMUNITY)
Admission: EM | Admit: 2022-11-23 | Discharge: 2022-11-24 | Disposition: A | Discharge status: Home / Self Care | Payer: Medicare HMO | Attending: Emergency Medicine | Admitting: Emergency Medicine

## 2022-11-23 ENCOUNTER — Encounter (HOSPITAL_COMMUNITY): Payer: Self-pay | Admitting: Emergency Medicine

## 2022-11-23 DIAGNOSIS — M25552 Pain in left hip: Secondary | ICD-10-CM | POA: Insufficient documentation

## 2022-11-23 DIAGNOSIS — M5412 Radiculopathy, cervical region: Secondary | ICD-10-CM | POA: Diagnosis not present

## 2022-11-23 DIAGNOSIS — M5416 Radiculopathy, lumbar region: Secondary | ICD-10-CM | POA: Insufficient documentation

## 2022-11-23 DIAGNOSIS — G9389 Other specified disorders of brain: Secondary | ICD-10-CM | POA: Diagnosis not present

## 2022-11-23 DIAGNOSIS — Y92410 Unspecified street and highway as the place of occurrence of the external cause: Secondary | ICD-10-CM | POA: Diagnosis not present

## 2022-11-23 DIAGNOSIS — M47816 Spondylosis without myelopathy or radiculopathy, lumbar region: Secondary | ICD-10-CM | POA: Diagnosis not present

## 2022-11-23 DIAGNOSIS — I739 Peripheral vascular disease, unspecified: Secondary | ICD-10-CM | POA: Diagnosis not present

## 2022-11-23 DIAGNOSIS — Z79899 Other long term (current) drug therapy: Secondary | ICD-10-CM | POA: Diagnosis not present

## 2022-11-23 DIAGNOSIS — M542 Cervicalgia: Secondary | ICD-10-CM | POA: Diagnosis not present

## 2022-11-23 DIAGNOSIS — Z7984 Long term (current) use of oral hypoglycemic drugs: Secondary | ICD-10-CM | POA: Insufficient documentation

## 2022-11-23 DIAGNOSIS — E119 Type 2 diabetes mellitus without complications: Secondary | ICD-10-CM | POA: Diagnosis not present

## 2022-11-23 DIAGNOSIS — I10 Essential (primary) hypertension: Secondary | ICD-10-CM | POA: Insufficient documentation

## 2022-11-23 DIAGNOSIS — R0789 Other chest pain: Secondary | ICD-10-CM | POA: Insufficient documentation

## 2022-11-23 DIAGNOSIS — M25432 Effusion, left wrist: Secondary | ICD-10-CM | POA: Diagnosis not present

## 2022-11-23 DIAGNOSIS — S0990XA Unspecified injury of head, initial encounter: Secondary | ICD-10-CM | POA: Diagnosis not present

## 2022-11-23 DIAGNOSIS — I6789 Other cerebrovascular disease: Secondary | ICD-10-CM | POA: Diagnosis not present

## 2022-11-23 DIAGNOSIS — R059 Cough, unspecified: Secondary | ICD-10-CM | POA: Diagnosis not present

## 2022-11-23 DIAGNOSIS — G238 Other specified degenerative diseases of basal ganglia: Secondary | ICD-10-CM | POA: Diagnosis not present

## 2022-11-23 DIAGNOSIS — Z041 Encounter for examination and observation following transport accident: Secondary | ICD-10-CM | POA: Diagnosis not present

## 2022-11-23 DIAGNOSIS — T1490XA Injury, unspecified, initial encounter: Secondary | ICD-10-CM | POA: Diagnosis not present

## 2022-11-23 LAB — CBC WITH DIFFERENTIAL/PLATELET
Abs Immature Granulocytes: 0.01 10*3/uL (ref 0.00–0.07)
Basophils Absolute: 0 10*3/uL (ref 0.0–0.1)
Basophils Relative: 0 %
Eosinophils Absolute: 0.1 10*3/uL (ref 0.0–0.5)
Eosinophils Relative: 2 %
HCT: 36.8 % (ref 36.0–46.0)
Hemoglobin: 11.2 g/dL — ABNORMAL LOW (ref 12.0–15.0)
Immature Granulocytes: 0 %
Lymphocytes Relative: 18 %
Lymphs Abs: 0.9 10*3/uL (ref 0.7–4.0)
MCH: 27.5 pg (ref 26.0–34.0)
MCHC: 30.4 g/dL (ref 30.0–36.0)
MCV: 90.2 fL (ref 80.0–100.0)
Monocytes Absolute: 0.5 10*3/uL (ref 0.1–1.0)
Monocytes Relative: 9 %
Neutro Abs: 3.6 10*3/uL (ref 1.7–7.7)
Neutrophils Relative %: 71 %
Platelets: 270 10*3/uL (ref 150–400)
RBC: 4.08 MIL/uL (ref 3.87–5.11)
RDW: 14.6 % (ref 11.5–15.5)
WBC: 5.1 10*3/uL (ref 4.0–10.5)
nRBC: 0 % (ref 0.0–0.2)

## 2022-11-23 LAB — BASIC METABOLIC PANEL
Anion gap: 9 (ref 5–15)
BUN: 13 mg/dL (ref 8–23)
CO2: 25 mmol/L (ref 22–32)
Calcium: 9.5 mg/dL (ref 8.9–10.3)
Chloride: 109 mmol/L (ref 98–111)
Creatinine, Ser: 1.15 mg/dL — ABNORMAL HIGH (ref 0.44–1.00)
GFR, Estimated: 48 mL/min — ABNORMAL LOW (ref >60)
Glucose, Bld: 151 mg/dL — ABNORMAL HIGH (ref 70–99)
Potassium: 3.8 mmol/L (ref 3.5–5.1)
Sodium: 143 mmol/L (ref 135–145)
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
## 2022-11-24 ENCOUNTER — Emergency Department (HOSPITAL_COMMUNITY): Payer: Medicare HMO

## 2022-11-24 DIAGNOSIS — M47816 Spondylosis without myelopathy or radiculopathy, lumbar region: Secondary | ICD-10-CM | POA: Diagnosis not present

## 2022-11-24 DIAGNOSIS — S60912A Unspecified superficial injury of left wrist, initial encounter: Secondary | ICD-10-CM | POA: Diagnosis not present

## 2022-11-24 MED ORDER — METHYLPREDNISOLONE 4 MG PO TBPK: ORAL_TABLET | ORAL | 0 refills | Status: DC

## 2022-11-24 MED ORDER — PREDNISONE 20 MG PO TABS
60.0000 mg | ORAL_TABLET | Freq: Once | ORAL | Status: AC
  Administered 2022-11-24: 60 mg via ORAL
  Filled 2022-11-24: qty 3
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
## 2022-11-30 DIAGNOSIS — M47817 Spondylosis without myelopathy or radiculopathy, lumbosacral region: Secondary | ICD-10-CM | POA: Diagnosis not present

## 2022-11-30 DIAGNOSIS — M5412 Radiculopathy, cervical region: Secondary | ICD-10-CM | POA: Diagnosis not present

## 2022-12-12 DIAGNOSIS — Z6829 Body mass index (BMI) 29.0-29.9, adult: Secondary | ICD-10-CM | POA: Diagnosis not present

## 2022-12-12 DIAGNOSIS — Z1389 Encounter for screening for other disorder: Secondary | ICD-10-CM | POA: Diagnosis not present

## 2022-12-12 DIAGNOSIS — Z0000 Encounter for general adult medical examination without abnormal findings: Secondary | ICD-10-CM | POA: Diagnosis not present

## 2022-12-13 DIAGNOSIS — N289 Disorder of kidney and ureter, unspecified: Secondary | ICD-10-CM | POA: Diagnosis not present

## 2022-12-13 DIAGNOSIS — M150 Primary generalized (osteo)arthritis: Secondary | ICD-10-CM | POA: Diagnosis not present

## 2022-12-13 DIAGNOSIS — M542 Cervicalgia: Secondary | ICD-10-CM | POA: Diagnosis not present

## 2022-12-13 DIAGNOSIS — M0589 Other rheumatoid arthritis with rheumatoid factor of multiple sites: Secondary | ICD-10-CM | POA: Diagnosis not present

## 2022-12-28 ENCOUNTER — Other Ambulatory Visit: Payer: Self-pay | Admitting: Endocrinology

## 2022-12-28 DIAGNOSIS — E119 Type 2 diabetes mellitus without complications: Secondary | ICD-10-CM

## 2022-12-29 ENCOUNTER — Other Ambulatory Visit: Payer: Self-pay | Admitting: Endocrinology

## 2022-12-30 ENCOUNTER — Other Ambulatory Visit (INDEPENDENT_AMBULATORY_CARE_PROVIDER_SITE_OTHER): Payer: Medicare HMO

## 2022-12-30 DIAGNOSIS — E119 Type 2 diabetes mellitus without complications: Secondary | ICD-10-CM | POA: Diagnosis not present

## 2022-12-30 LAB — BASIC METABOLIC PANEL
BUN: 19 mg/dL (ref 6–23)
CO2: 25 mEq/L (ref 19–32)
Calcium: 9.5 mg/dL (ref 8.4–10.5)
Chloride: 106 mEq/L (ref 96–112)
Creatinine, Ser: 0.96 mg/dL (ref 0.40–1.20)
GFR: 56.25 mL/min — ABNORMAL LOW (ref >60.00)
Glucose, Bld: 96 mg/dL (ref 70–99)
Potassium: 3.5 mEq/L (ref 3.5–5.1)
Sodium: 141 mEq/L (ref 135–145)

## 2022-12-30 LAB — HEMOGLOBIN A1C: Hgb A1c MFr Bld: 6.1 % (ref 4.6–6.5)

## 2023-01-02 ENCOUNTER — Other Ambulatory Visit: Payer: Medicare HMO

## 2023-01-04 ENCOUNTER — Encounter: Payer: Self-pay | Admitting: Endocrinology

## 2023-01-04 ENCOUNTER — Ambulatory Visit (INDEPENDENT_AMBULATORY_CARE_PROVIDER_SITE_OTHER): Payer: Medicare HMO | Admitting: Endocrinology

## 2023-01-04 VITALS — BP 132/80 | HR 94 | Ht 64.0 in | Wt 166.0 lb

## 2023-01-04 DIAGNOSIS — E876 Hypokalemia: Secondary | ICD-10-CM | POA: Diagnosis not present

## 2023-01-04 DIAGNOSIS — E119 Type 2 diabetes mellitus without complications: Secondary | ICD-10-CM

## 2023-01-04 DIAGNOSIS — I10 Essential (primary) hypertension: Secondary | ICD-10-CM | POA: Diagnosis not present
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
## 2023-01-04 NOTE — Patient Instructions (Signed)
Full tab spironolactone and 1/2 Labetalol 2x daily

## 2023-01-12 ENCOUNTER — Encounter: Payer: Self-pay | Admitting: Cardiovascular Disease
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
## 2023-01-13 ENCOUNTER — Ambulatory Visit: Payer: Medicare HMO | Attending: Cardiovascular Disease | Admitting: Cardiovascular Disease

## 2023-01-13 ENCOUNTER — Other Ambulatory Visit (INDEPENDENT_AMBULATORY_CARE_PROVIDER_SITE_OTHER): Payer: Medicare HMO

## 2023-01-13 ENCOUNTER — Encounter: Payer: Self-pay | Admitting: Cardiovascular Disease

## 2023-01-13 VITALS — BP 138/70 | HR 93 | Ht 64.0 in | Wt 163.0 lb

## 2023-01-13 DIAGNOSIS — R55 Syncope and collapse: Secondary | ICD-10-CM

## 2023-01-13 NOTE — Progress Notes (Unsigned)
Applied a 14 day Zio AT monitor to patient in the office

## 2023-01-13 NOTE — Patient Instructions (Signed)
Medication Instructions:  Your physician recommends that you continue on your current medications as directed. Please refer to the Current Medication list given to you today.  *If you need a refill on your cardiac medications before your next appointment, please call your pharmacy*   Lab Work: NONE If you have labs (blood work) drawn today and your tests are completely normal, you will receive your results only by: [place] (if you have MyChart) OR A paper copy in the mail If you have any lab test that is abnormal or we need to change your treatment, we will call you to review the results.  Testing/Procedures: ECHO Your physician has requested that you have an echocardiogram. Echocardiography is a painless test that uses sound waves to create images of your heart. It provides your doctor with information about the size and shape of your heart and how well your heart's chambers and valves are working. This procedure takes approximately one hour. There are no restrictions for this procedure. Please do NOT wear cologne, perfume, aftershave, or lotions (deodorant is allowed). Please arrive 15 minutes prior to your appointment time.  14-day Zio (live) Your physician has recommended that you wear an event monitor. Event monitors are medical devices that record the heart's electrical activity. Doctors most often [place] these monitors to diagnose arrhythmias. Arrhythmias are problems with the speed or rhythm of the heartbeat. The monitor is a small, portable device. You can wear one while you do your normal daily activities. This is usually used to diagnose what is causing palpitations/syncope (passing out).  Follow-Up: At [facility], you and your health needs are our priority.  As part of our continuing mission to provide you with exceptional heart care, we have created designated Provider Care Teams.  These Care Teams include your primary Cardiologist (physician) and Advanced Practice  Providers (APPs -  Physician Assistants and Nurse Practitioners) who all work together to provide you with the care you need, when you need it.  We recommend signing up for the patient portal called "MyChart".  Sign up information is provided on this After Visit Summary.  MyChart is used to connect with patients for Virtual Visits (Telemedicine).  Patients are able to view lab/test results, encounter notes, upcoming appointments, etc.  Non-urgent messages can be sent to your provider as well.   To learn more about what you can do with MyChart, go to [email or web address].    Your next appointment:   6 month(s)  Provider:   Christen Bame, NP

## 2023-01-14 DIAGNOSIS — R55 Syncope and collapse: Secondary | ICD-10-CM | POA: Diagnosis not present

## 2023-01-24 ENCOUNTER — Other Ambulatory Visit: Payer: Self-pay | Admitting: Endocrinology

## 2023-01-24 ENCOUNTER — Other Ambulatory Visit: Payer: Self-pay | Admitting: Cardiovascular Disease

## 2023-01-24 DIAGNOSIS — E119 Type 2 diabetes mellitus without complications: Secondary | ICD-10-CM

## 2023-01-24 MED ORDER — SPIRONOLACTONE 100 MG PO TABS: 100.0000 mg | ORAL_TABLET | Freq: Every day | ORAL | 3 refills | Status: DC

## 2023-01-24 NOTE — Addendum Note (Signed)
Addended by: Carter Kitten D on: 01/24/2023 03:59 PM   Modules accepted: Orders

## 2023-02-03 ENCOUNTER — Ambulatory Visit (HOSPITAL_COMMUNITY): Payer: Medicare HMO | Attending: Cardiology

## 2023-02-03 DIAGNOSIS — R55 Syncope and collapse: Secondary | ICD-10-CM | POA: Diagnosis not present

## 2023-02-03 LAB — ECHOCARDIOGRAM COMPLETE
AR max vel: 2.01 cm2
AV Mean grad: 11 mmHg
AV Peak grad: 22.3 mmHg
Ao pk vel: 2.36 m/s
Area-P 1/2: 3.53 cm2
S' Lateral: 2.1 cm

## 2023-03-01 DIAGNOSIS — M5412 Radiculopathy, cervical region: Secondary | ICD-10-CM | POA: Diagnosis not present

## 2023-03-01 DIAGNOSIS — Z79899 Other long term (current) drug therapy: Secondary | ICD-10-CM | POA: Diagnosis not present

## 2023-03-01 DIAGNOSIS — Z5181 Encounter for therapeutic drug level monitoring: Secondary | ICD-10-CM | POA: Diagnosis not present

## 2023-03-01 DIAGNOSIS — M47817 Spondylosis without myelopathy or radiculopathy, lumbosacral region: Secondary | ICD-10-CM | POA: Diagnosis not present

## 2023-03-27 DIAGNOSIS — M069 Rheumatoid arthritis, unspecified: Secondary | ICD-10-CM | POA: Diagnosis not present

## 2023-03-27 DIAGNOSIS — G894 Chronic pain syndrome: Secondary | ICD-10-CM | POA: Diagnosis not present

## 2023-03-27 DIAGNOSIS — J309 Allergic rhinitis, unspecified: Secondary | ICD-10-CM | POA: Diagnosis not present

## 2023-03-27 DIAGNOSIS — E119 Type 2 diabetes mellitus without complications: Secondary | ICD-10-CM | POA: Diagnosis not present

## 2023-03-27 DIAGNOSIS — E663 Overweight: Secondary | ICD-10-CM | POA: Diagnosis not present

## 2023-03-27 DIAGNOSIS — E7800 Pure hypercholesterolemia, unspecified: Secondary | ICD-10-CM | POA: Diagnosis not present

## 2023-03-27 DIAGNOSIS — D649 Anemia, unspecified: Secondary | ICD-10-CM | POA: Diagnosis not present

## 2023-03-27 DIAGNOSIS — G319 Degenerative disease of nervous system, unspecified: Secondary | ICD-10-CM | POA: Diagnosis not present

## 2023-03-27 DIAGNOSIS — D509 Iron deficiency anemia, unspecified: Secondary | ICD-10-CM | POA: Diagnosis not present

## 2023-03-27 DIAGNOSIS — R55 Syncope and collapse: Secondary | ICD-10-CM | POA: Diagnosis not present

## 2023-04-07 DIAGNOSIS — E119 Type 2 diabetes mellitus without complications: Secondary | ICD-10-CM | POA: Diagnosis not present

## 2023-04-07 LAB — HM DIABETES EYE EXAM

## 2023-05-02 DIAGNOSIS — S50862A Insect bite (nonvenomous) of left forearm, initial encounter: Secondary | ICD-10-CM | POA: Diagnosis not present

## 2023-05-02 DIAGNOSIS — T63441A Toxic effect of venom of bees, accidental (unintentional), initial encounter: Secondary | ICD-10-CM | POA: Diagnosis not present

## 2023-05-02 DIAGNOSIS — Z6828 Body mass index (BMI) 28.0-28.9, adult: Secondary | ICD-10-CM | POA: Diagnosis not present

## 2023-05-12 ENCOUNTER — Other Ambulatory Visit (INDEPENDENT_AMBULATORY_CARE_PROVIDER_SITE_OTHER): Payer: Medicare HMO

## 2023-05-12 DIAGNOSIS — E119 Type 2 diabetes mellitus without complications: Secondary | ICD-10-CM | POA: Diagnosis not present

## 2023-05-12 LAB — COMPREHENSIVE METABOLIC PANEL
ALT: 18 U/L (ref 0–35)
AST: 25 U/L (ref 0–37)
Albumin: 4.5 g/dL (ref 3.5–5.2)
Alkaline Phosphatase: 95 U/L (ref 39–117)
BUN: 26 mg/dL — ABNORMAL HIGH (ref 6–23)
CO2: 24 mEq/L (ref 19–32)
Calcium: 9.5 mg/dL (ref 8.4–10.5)
Chloride: 104 mEq/L (ref 96–112)
Creatinine, Ser: 1.33 mg/dL — ABNORMAL HIGH (ref 0.40–1.20)
GFR: 37.94 mL/min — ABNORMAL LOW (ref >60.00)
Glucose, Bld: 89 mg/dL (ref 70–99)
Potassium: 4.9 mEq/L (ref 3.5–5.1)
Sodium: 138 mEq/L (ref 135–145)
Total Bilirubin: 0.6 mg/dL (ref 0.2–1.2)
Total Protein: 7.7 g/dL (ref 6.0–8.3)

## 2023-05-12 LAB — MICROALBUMIN / CREATININE URINE RATIO
Creatinine,U: 238.2 mg/dL
Microalb Creat Ratio: 2 mg/g (ref 0.0–30.0)
Microalb, Ur: 4.7 mg/dL — ABNORMAL HIGH (ref 0.0–1.9)

## 2023-05-12 LAB — LIPID PANEL
Cholesterol: 154 mg/dL (ref 0–200)
HDL: 79.3 mg/dL (ref >39.00)
LDL Cholesterol: 65 mg/dL (ref 0–99)
NonHDL: 74.8
Total CHOL/HDL Ratio: 2
Triglycerides: 51 mg/dL (ref 0.0–149.0)
VLDL: 10.2 mg/dL (ref 0.0–40.0)

## 2023-05-12 LAB — HEMOGLOBIN A1C: Hgb A1c MFr Bld: 6.1 % (ref 4.6–6.5)
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
## 2023-05-17 ENCOUNTER — Telehealth: Payer: Self-pay

## 2023-05-17 ENCOUNTER — Ambulatory Visit: Payer: Medicare HMO | Admitting: Endocrinology

## 2023-05-17 ENCOUNTER — Encounter: Payer: Self-pay | Admitting: Endocrinology

## 2023-05-17 VITALS — BP 130/85 | HR 73 | Ht 63.0 in | Wt 161.4 lb

## 2023-05-17 DIAGNOSIS — E7800 Pure hypercholesterolemia, unspecified: Secondary | ICD-10-CM | POA: Diagnosis not present

## 2023-05-17 DIAGNOSIS — Z7984 Long term (current) use of oral hypoglycemic drugs: Secondary | ICD-10-CM | POA: Diagnosis not present

## 2023-05-17 DIAGNOSIS — E876 Hypokalemia: Secondary | ICD-10-CM | POA: Diagnosis not present

## 2023-05-17 DIAGNOSIS — I10 Essential (primary) hypertension: Secondary | ICD-10-CM | POA: Diagnosis not present

## 2023-05-17 DIAGNOSIS — E119 Type 2 diabetes mellitus without complications: Secondary | ICD-10-CM | POA: Diagnosis not present

## 2023-05-17 NOTE — Telephone Encounter (Signed)
Per patient she is taking Labetalol 100mg  take 0.5 tablet by mouth two times a day.  Patient says spironolactone 100mg  tablet, 1 tab by mouth daily.  Klor-con 20, take 1/2 tablet by mouth daily.  She is confused when/how to take klor-con.  Meformin hcl 1000mg  take 1 tablet by mouth twice daily.  Please clarify directions.

## 2023-05-17 NOTE — Patient Instructions (Addendum)
Call back about BP pills  Check blood sugars on waking up 2-3 days a week  Also check blood sugars about 2 hours after meals and do this after different meals by rotation  Recommended blood sugar levels on waking up are 90-130 and about 2 hours after meal is 130-160  Please bring your blood sugar monitor to each visit, thank you

## 2023-05-18 NOTE — Telephone Encounter (Signed)
Left message for patient to call back to discuss Dr Emelda Brothers instructions for medications"   She will continue labetalol half tablet twice a day along with half tablet of potassium in the morning as before but go back to half tablet on the spironolactone.  To check blood pressure regularly at home

## 2023-05-19 MED ORDER — SPIRONOLACTONE 100 MG PO TABS: 50.0000 mg | ORAL_TABLET | Freq: Every day | ORAL | 3 refills | Status: DC

## 2023-05-19 NOTE — Telephone Encounter (Signed)
Advised patient of medications doses.  She will continue labetalol half tablet twice a day along with half tablet of potassium in the morning as before but go back to half tablet on the spironolactone.  To check blood pressure regularly at home

## 2023-05-30 DIAGNOSIS — S29012A Strain of muscle and tendon of back wall of thorax, initial encounter: Secondary | ICD-10-CM | POA: Diagnosis not present

## 2023-05-30 DIAGNOSIS — M25511 Pain in right shoulder: Secondary | ICD-10-CM | POA: Diagnosis not present

## 2023-05-30 DIAGNOSIS — Z6828 Body mass index (BMI) 28.0-28.9, adult: Secondary | ICD-10-CM | POA: Diagnosis not present

## 2023-05-30 DIAGNOSIS — M62838 Other muscle spasm: Secondary | ICD-10-CM | POA: Diagnosis not present

## 2023-05-30 DIAGNOSIS — Z0000 Encounter for general adult medical examination without abnormal findings: Secondary | ICD-10-CM | POA: Diagnosis not present

## 2023-05-30 DIAGNOSIS — M25512 Pain in left shoulder: Secondary | ICD-10-CM | POA: Diagnosis not present

## 2023-06-01 ENCOUNTER — Telehealth: Payer: Self-pay | Admitting: Endocrinology

## 2023-06-01 NOTE — Telephone Encounter (Signed)
Patient brought by eye exam results today saying that Dr. Lucianne Muss told her he had not received the results.  The results are in Dr. Remus Blake folder in the front office.

## 2023-06-13 DIAGNOSIS — M0589 Other rheumatoid arthritis with rheumatoid factor of multiple sites: Secondary | ICD-10-CM | POA: Diagnosis not present

## 2023-06-13 DIAGNOSIS — M25511 Pain in right shoulder: Secondary | ICD-10-CM | POA: Diagnosis not present

## 2023-06-13 DIAGNOSIS — M150 Primary generalized (osteo)arthritis: Secondary | ICD-10-CM | POA: Diagnosis not present

## 2023-06-13 DIAGNOSIS — N289 Disorder of kidney and ureter, unspecified: Secondary | ICD-10-CM | POA: Diagnosis not present

## 2023-06-26 ENCOUNTER — Other Ambulatory Visit: Payer: Self-pay | Admitting: Endocrinology

## 2023-06-26 DIAGNOSIS — I700 Atherosclerosis of aorta: Secondary | ICD-10-CM | POA: Diagnosis not present

## 2023-06-26 DIAGNOSIS — E7800 Pure hypercholesterolemia, unspecified: Secondary | ICD-10-CM | POA: Diagnosis not present

## 2023-06-26 DIAGNOSIS — S0083XA Contusion of other part of head, initial encounter: Secondary | ICD-10-CM | POA: Diagnosis not present

## 2023-06-26 DIAGNOSIS — Z6828 Body mass index (BMI) 28.0-28.9, adult: Secondary | ICD-10-CM | POA: Diagnosis not present

## 2023-06-26 DIAGNOSIS — R296 Repeated falls: Secondary | ICD-10-CM | POA: Diagnosis not present

## 2023-06-26 DIAGNOSIS — E663 Overweight: Secondary | ICD-10-CM | POA: Diagnosis not present

## 2023-06-26 DIAGNOSIS — E119 Type 2 diabetes mellitus without complications: Secondary | ICD-10-CM

## 2023-06-26 DIAGNOSIS — Z9181 History of falling: Secondary | ICD-10-CM | POA: Diagnosis not present

## 2023-07-07 ENCOUNTER — Other Ambulatory Visit: Payer: Self-pay | Admitting: Endocrinology
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
## 2023-07-17 ENCOUNTER — Ambulatory Visit: Payer: Medicare HMO | Attending: Nurse Practitioner | Admitting: Nurse Practitioner

## 2023-07-17 ENCOUNTER — Encounter: Payer: Self-pay | Admitting: Nurse Practitioner

## 2023-07-17 VITALS — BP 118/72 | HR 67 | Ht 64.5 in | Wt 163.6 lb

## 2023-07-17 DIAGNOSIS — E785 Hyperlipidemia, unspecified: Secondary | ICD-10-CM | POA: Diagnosis not present

## 2023-07-17 DIAGNOSIS — R55 Syncope and collapse: Secondary | ICD-10-CM | POA: Diagnosis not present

## 2023-07-17 DIAGNOSIS — I700 Atherosclerosis of aorta: Secondary | ICD-10-CM | POA: Diagnosis not present

## 2023-07-17 DIAGNOSIS — I10 Essential (primary) hypertension: Secondary | ICD-10-CM | POA: Diagnosis not present

## 2023-07-17 DIAGNOSIS — E876 Hypokalemia: Secondary | ICD-10-CM | POA: Diagnosis not present

## 2023-07-17 NOTE — Patient Instructions (Signed)
Medication Instructions:   Your physician recommends that you continue on your current medications as directed. Please refer to the Current Medication list given to you today.   *If you need a refill on your cardiac medications before your next appointment, please call your pharmacy*   Lab Work:  None ordered.  If you have labs (blood work) drawn today and your tests are completely normal, you will receive your results only by: MyChart Message (if you have MyChart) OR A paper copy in the mail If you have any lab test that is abnormal or we need to change your treatment, we will call you to review the results.   Testing/Procedures:  None ordered.   Follow-Up: At [facility], you and your health needs are our priority.  As part of our continuing mission to provide you with exceptional heart care, we have created designated Provider Care Teams.  These Care Teams include your primary Cardiologist (physician) and Advanced Practice Providers (APPs -  Physician Assistants and Nurse Practitioners) who all work together to provide you with the care you need, when you need it.  We recommend signing up for the patient portal called "MyChart".  Sign up information is provided on this After Visit Summary.  MyChart is used to connect with patients for Virtual Visits (Telemedicine).  Patients are able to view lab/test results, encounter notes, upcoming appointments, etc.  Non-urgent messages can be sent to your provider as well.   To learn more about what you can do with MyChart, go to [email or web address].    Your next appointment:   6 month(s)  Provider:   Philip Nahser, MD      

## 2023-08-07 ENCOUNTER — Other Ambulatory Visit: Payer: Self-pay | Admitting: Endocrinology

## 2023-09-18 ENCOUNTER — Telehealth: Payer: Self-pay | Admitting: Endocrinology

## 2023-09-18 ENCOUNTER — Other Ambulatory Visit: Payer: Self-pay

## 2023-09-18 NOTE — Telephone Encounter (Signed)
Patient needing her lancets and test strips refilled please send to [facility] [address] and advise patient when done.

## 2023-09-18 NOTE — Telephone Encounter (Signed)
I called and left April [name] a message as to which test strips is she currently using, one touch vs accu check?

## 2023-09-19 ENCOUNTER — Telehealth: Payer: Self-pay | Admitting: Endocrinology

## 2023-09-19 ENCOUNTER — Other Ambulatory Visit: Payer: Self-pay

## 2023-09-19 DIAGNOSIS — E119 Type 2 diabetes mellitus without complications: Secondary | ICD-10-CM

## 2023-09-19 MED ORDER — ACCU-CHEK SOFTCLIX LANCETS MISC: 2 refills | Status: DC

## 2023-09-19 MED ORDER — ACCU-CHEK GUIDE VI STRP: ORAL_STRIP | 2 refills | Status: AC

## 2023-09-19 NOTE — Telephone Encounter (Signed)
Patient advising she need a refill of her Accu-check soft clix tests strips and her test strips sent to [facility] on [address]

## 2023-09-19 NOTE — Telephone Encounter (Signed)
Accucheck strips and lancets refill request sent

## 2023-10-03 ENCOUNTER — Encounter: Payer: Self-pay | Admitting: Endocrinology

## 2023-10-03 ENCOUNTER — Telehealth: Payer: Self-pay

## 2023-10-03 ENCOUNTER — Ambulatory Visit: Payer: Medicare HMO | Admitting: Endocrinology

## 2023-10-03 VITALS — BP 122/60 | HR 69 | Resp 20 | Ht 64.5 in | Wt 166.4 lb

## 2023-10-03 DIAGNOSIS — Z7984 Long term (current) use of oral hypoglycemic drugs: Secondary | ICD-10-CM | POA: Diagnosis not present

## 2023-10-03 DIAGNOSIS — E118 Type 2 diabetes mellitus with unspecified complications: Secondary | ICD-10-CM | POA: Diagnosis not present

## 2023-10-03 DIAGNOSIS — E7800 Pure hypercholesterolemia, unspecified: Secondary | ICD-10-CM

## 2023-10-03 DIAGNOSIS — E876 Hypokalemia: Secondary | ICD-10-CM

## 2023-10-03 DIAGNOSIS — I10 Essential (primary) hypertension: Secondary | ICD-10-CM | POA: Diagnosis not present

## 2023-10-03 LAB — COMPREHENSIVE METABOLIC PANEL
ALT: 16 U/L (ref 0–35)
AST: 20 U/L (ref 0–37)
Albumin: 4.2 g/dL (ref 3.5–5.2)
Alkaline Phosphatase: 69 U/L (ref 39–117)
BUN: 25 mg/dL — ABNORMAL HIGH (ref 6–23)
CO2: 24 meq/L (ref 19–32)
Calcium: 9.3 mg/dL (ref 8.4–10.5)
Chloride: 110 meq/L (ref 96–112)
Creatinine, Ser: 0.93 mg/dL (ref 0.40–1.20)
GFR: 58.12 mL/min — ABNORMAL LOW (ref >60.00)
Glucose, Bld: 86 mg/dL (ref 70–99)
Potassium: 3.8 meq/L (ref 3.5–5.1)
Sodium: 142 meq/L (ref 135–145)
Total Bilirubin: 0.5 mg/dL (ref 0.2–1.2)
Total Protein: 6.7 g/dL (ref 6.0–8.3)

## 2023-10-03 LAB — BASIC METABOLIC PANEL
BUN: 25 mg/dL — ABNORMAL HIGH (ref 6–23)
CO2: 24 meq/L (ref 19–32)
Calcium: 9.3 mg/dL (ref 8.4–10.5)
Chloride: 110 meq/L (ref 96–112)
Creatinine, Ser: 0.93 mg/dL (ref 0.40–1.20)
GFR: 58.12 mL/min — ABNORMAL LOW (ref >60.00)
Glucose, Bld: 86 mg/dL (ref 70–99)
Potassium: 3.8 meq/L (ref 3.5–5.1)
Sodium: 142 meq/L (ref 135–145)

## 2023-10-03 LAB — POCT GLYCOSYLATED HEMOGLOBIN (HGB A1C): Hemoglobin A1C: 5.3 % (ref 4.0–5.6)

## 2023-10-03 LAB — HEMOGLOBIN A1C: Hgb A1c MFr Bld: 5.8 % (ref 4.6–6.5)

## 2023-10-03 NOTE — Telephone Encounter (Signed)
Labs results given to patient as directed by MD, no further questions at this time.

## 2023-10-03 NOTE — Patient Instructions (Signed)
No change on medication.

## 2023-10-03 NOTE — Progress Notes (Signed)
Outpatient Endocrinology Note [place] Erika Slaby, MD  10/03/23  Patient's Name: April [name]    DOB: [birth year]    MRN: [id number]                                                    REASON OF VISIT: Follow up of type 2 diabetes mellitus /hypertension/hypokalemia  PCP: Sigmund Hazel, MD  HISTORY OF PRESENT ILLNESS:   April [name] is a 82 y.o. old female with past medical history listed below, is here for follow up for type 2 diabetes mellitus /hypertension/hypokalemia.  Patient was last seen by Dr. Lucianne Muss in June 2024.  Pertinent Diabetes History: Patient has type 2 diabetes mellitus since at least 2014.  She has well-controlled diabetes on metformin 500 mg 2 times a day.  Chronic Diabetes Complications : Retinopathy: no. Last ophthalmology exam was done on annually reportedly, following with ophthalmology regularly.  Nephropathy: CKD III Peripheral neuropathy:yes, on gabapentin. Coronary artery disease: no Stroke: mo  Relevant comorbidities and cardiovascular risk factors: Obesity: no Body mass index is 28.12 kg/m.  Hypertension: Yes  Hyperlipidemia : Yes, on statin   Current / Home Diabetic regimen includes: Metformin XR 500 mg two times a day.   Prior diabetic medications: She had side effect with higher doses of regular metformin.  Glycemic data:   She has Accu-Chek guide glucometer.  Glucometer download in last 2 weeks October 15 to October 29 reviewed.  Average blood sugar 87.  Lowest blood sugar 81 and highest blood sugar 95.  She has been checking blood sugar occasionally mostly in the morning fasting and sometime in the afternoon some of the blood sugar 83, 95, 92.  Hypoglycemia: Patient has no hypoglycemic episodes. Patient has hypoglycemia awareness, has a glucagon ER kit and diabetic alert device.   Factors modifying glucose control: 1.  Diabetic diet assessment: Eating vegetables and low-fat meals.  2.  Staying active or exercising: Cannot exercise  due to joint pain.  3.  Medication compliance: compliant all of the time.  # Hypertension : -Diagnosed around 1979. Previously she was taking Avapro, clonidine 0.6 at bedtime and Dyazide but blood pressure was relatively high with this regimen. For evaluation of her hyperaldosteronism she was switched to labetalol 100 mg twice a day, Tenex 2 mg and doxazosin. Evaluation for hyperaldosteronism was done, aldosterone level was 6.0 along with a relatively low renin level. With this regimen her blood pressure was much better She was subsequently started on Aldactone to help with her severe hypokalemia and doxazosin and guanfacine were stopped. Previously also on HCTZ   CURRENT regimen of Aldactone 50  Dose mg daily, labetalol 50 mg twice a day   # Hypokalemia -Hypokalemia previously had been a significant longstanding problem and associated with hypertension. Previously had  been prescribed 6 tablets of potassium daily. She was off her diuretics and Avapro when her evaluation for hyperaldosteronism was done, aldosterone level was 6.0 along with a relatively low renin level. She has a normal potassium level previously with Aldactone, 50 mg daily along with 1 tablet of potassium 20 mEq daily.  Currently taking potassium chloride 10 Meq tablet taking half tablet daily.   Interval history  Glucometer data as reviewed above.  Diabetes regimen as reviewed above.  Blood pressure medications and potassium supplement as reviewed above.  Blood pressure today  well-controlled.  She has no complaints.  REVIEW OF SYSTEMS As per history of present illness.   PAST MEDICAL HISTORY: Past Medical History:  Diagnosis Date   Allergic rhinitis    Diabetes mellitus    TYPE 2   Dyslipidemia    Hyperkalemia    Hypertension    Insomnia    Pt stated no trouble falling or staying asleep.   RA (rheumatoid arthritis) (HCC)     PAST SURGICAL HISTORY: Past Surgical History:  Procedure Laterality Date   BREAST CYST  EXCISION Right 1967   HAND TENDON SURGERY     Right   OVARIAN CYST SURGERY Right 1988    ALLERGIES: Allergies  Allergen Reactions   Acetaminophen Nausea Only   Actos [Pioglitazone Hydrochloride]     edema   Aspirin Nausea Only   Atorvastatin Other (See Comments)    Felt drunk/high floating   Morphine Sulfate Other (See Comments)    Burns injecting "arm is on fire"   Naproxen Sodium Nausea Only   Sitagliptin Phosphate Nausea And Vomiting    FAMILY HISTORY:  Family History  Problem Relation Age of Onset   Diabetes Mother    Diabetes Father    Cancer Father        Prostate   Diabetes Sister    Stroke Maternal Grandmother        Cause of death   Healthy Brother     SOCIAL HISTORY: Social History   Socioeconomic History   Marital status: Married    Spouse name: Not on file   Number of children: Not on file   Years of education: Not on file   Highest education level: Not on file  Occupational History   Not on file  Tobacco Use   Smoking status: Never   Smokeless tobacco: Never  Vaping Use   Vaping status: Never Used  Substance and Sexual Activity   Alcohol use: No   Drug use: No   Sexual activity: Not on file  Other Topics Concern   Not on file  Social History Narrative   Not on file   Social Determinants of Health   Financial Resource Strain: Not on file  Food Insecurity: No Food Insecurity (03/10/2020)   Hunger Vital Sign    Worried About Running Out of Food in the Last Year: Never true    Ran Out of Food in the Last Year: Never true  Transportation Needs: No Transportation Needs (03/10/2020)   PRAPARE - Administrator, Civil Service (Medical): No    Lack of Transportation (Non-Medical): No  Physical Activity: Not on file  Stress: Not on file  Social Connections: Not on file    MEDICATIONS:  Current Outpatient Medications  Medication Sig Dispense Refill   Accu-Chek Softclix Lancets lancets Use to check blood sugar once daily 100 each 2    Blood Glucose Monitoring Suppl (ACCU-CHEK GUIDE) w/Device KIT Use to test blood sugar once daily 1 kit 0   Cetirizine HCl (ZYRTEC PO) 1 tab     Coenzyme Q10 (CO Q 10) 100 MG CAPS Take by mouth daily.     ezetimibe (ZETIA) 10 MG tablet Take 10 mg by mouth daily.     fluticasone (FLONASE) 50 MCG/ACT nasal spray Place 1 spray into both nostrils as needed for allergies.     folic acid (FOLVITE) 0.5 MG tablet Take 0.5 mg by mouth daily.      gabapentin (NEURONTIN) 300 MG capsule Take 300 mg by mouth 2 (  two) times daily.     Ginger 500 MG CAPS Take 1 capsule by mouth daily.      glucose blood (ACCU-CHEK GUIDE) test strip Use to check blood sugar once a day 100 strip 2   labetalol (NORMODYNE) 100 MG tablet TAKE 0.5 TABLETS BY MOUTH 2 TIMES DAILY. 90 tablet 0   metFORMIN (GLUCOPHAGE-XR) 500 MG 24 hr tablet Take 1 tablet once a day for 7 days, then 1 tablet twice a day. 180 tablet 2   montelukast (SINGULAIR) 10 MG tablet Take 10 mg by mouth daily.      potassium chloride SA (KLOR-CON M20) 20 MEQ tablet TAKE 1/2 TABLET BY MOUTH EVERY DAY 45 tablet 1   pravastatin (PRAVACHOL) 80 MG tablet TAKE 1 TABLET BY MOUTH EVERY DAY 90 tablet 1   spironolactone (ALDACTONE) 100 MG tablet Take 0.5 tablets (50 mg total) by mouth daily. 90 tablet 3   Turmeric 450 MG CAPS Take 1 capsule by mouth daily.      vitamin B-12 (CYANOCOBALAMIN) 1000 MCG tablet Take 1,000 mcg by mouth daily.     tapentadol (NUCYNTA) 50 MG tablet Take 50 mg by mouth as needed for severe pain (spasms).  (Patient not taking: Reported on 10/03/2023)     No current facility-administered medications for this visit.    PHYSICAL EXAM: Vitals:   10/03/23 0809  BP: 122/60  Pulse: 69  Resp: 20  SpO2: 98%  Weight: 166 lb 6.4 oz (75.5 kg)  Height: 5' 4.5" (1.638 m)   Body mass index is 28.12 kg/m.  Wt Readings from Last 3 Encounters:  10/03/23 166 lb 6.4 oz (75.5 kg)  07/17/23 163 lb 9.6 oz (74.2 kg)  05/17/23 161 lb 6.4 oz (73.2 kg)     General: Well developed, well nourished female in no apparent distress.  HEENT: AT/Tangent, no external lesions.  Eyes: Conjunctiva clear and no icterus. Neck: Neck supple  Lungs: Respirations not labored Neurologic: Alert, oriented, normal speech Extremities / Skin: Dry.  Psychiatric: Does not appear depressed or anxious  Diabetic Foot Exam - Simple   No data filed      LABS Reviewed Lab Results  Component Value Date   HGBA1C 5.3 10/03/2023   HGBA1C 6.1 05/12/2023   HGBA1C 6.1 12/30/2022   Lab Results  Component Value Date   FRUCTOSAMINE 235 06/29/2020   FRUCTOSAMINE 211 02/24/2020   Lab Results  Component Value Date   CHOL 154 05/12/2023   HDL 79.30 05/12/2023   LDLCALC 65 05/12/2023   LDLDIRECT 85.0 10/09/2019   TRIG 51.0 05/12/2023   CHOLHDL 2 05/12/2023   Lab Results  Component Value Date   MICRALBCREAT 2.0 05/12/2023   MICRALBCREAT 1.6 04/18/2022   Lab Results  Component Value Date   CREATININE 1.33 (H) 05/12/2023   Lab Results  Component Value Date   GFR 37.94 (L) 05/12/2023    Latest Reference Range & Units 07/28/14 09:31  ALDOSTERONE ng/dL 6  PRA LC/MS/MS 4.09 - 5.82 ng/mL/h 0.21 (L)  ALDO / PRA Ratio 0.9 - 28.9 Ratio 28.6  (L): Data is abnormally low  ASSESSMENT / PLAN  1. Controlled type 2 diabetes mellitus with complication, without long-term current use of insulin (HCC)   2. Essential hypertension   3. Hypokalemia   4. Pure hypercholesterolemia     Diabetes Mellitus type 2, complicated by CKD /neuropathy. - Diabetic status / severity: Controlled.  Lab Results  Component Value Date   HGBA1C 5.3 10/03/2023    - Hemoglobin A1c  goal : <7%  - Medications: No change.  I) continue metformin extended release 500 mg 2 times a day.  - Home glucose testing: Few times a week in the morning fasting. - Discussed/ Gave Hypoglycemia treatment plan.  # Consult : not required at this time.   # Annual urine for microalbuminuria/ creatinine  ratio, no microalbuminuria currently.  She has CKD 3.  Will check BMP today. Last  Lab Results  Component Value Date   MICRALBCREAT 2.0 05/12/2023    # Foot check nightly / neuropathy, continue gabapentin.  # Annual dilated diabetic eye exams.   - Diet: Make healthy diabetic food choices - Life style / activity / exercise: Discussed.  2. Blood pressure  -  BP Readings from Last 1 Encounters:  10/03/23 122/60    - Control is in target.  - No change in current plans.  3. Lipid status / Hyperlipidemia - Last  Lab Results  Component Value Date   LDLCALC 65 05/12/2023   - Continue pravastatin 80 mg daily.  Zetia 10 mg daily.  # Hypertension -Blood pressure controlled. -Continue current dose of spironolactone/Aldactone 50 mg daily. -Continue current dose of labetalol 50 mg 2 times a day.  # Hypokalemia -Continue current dose of potassium chloride 20 mEq half tablet daily. -Check BMP  Hypertension and hypokalemia comanaged with primary care provider.  Diagnoses and all orders for this visit:  Controlled type 2 diabetes mellitus with complication, without long-term current use of insulin (HCC) -     POCT glycosylated hemoglobin (Hb A1C) -     Basic metabolic panel; Future -     Basic metabolic panel -     Comprehensive metabolic panel -     Hemoglobin A1c  Essential hypertension  Hypokalemia  Pure hypercholesterolemia    DISPOSITION Follow up in clinic in 4 months suggested.   All questions answered and patient verbalized understanding of the plan.  [place] Gabriela Irigoyen, MD [facility] Endocrin[facility] [address] [place], [address] [place], [place] [address] Phone # [phone]  At least part of this note was generated using voice recognition software. Inadvertent word errors may have occurred, which were not recognized during the proofreading process.

## 2023-10-03 NOTE — Telephone Encounter (Signed)
-----   Message from [place] Thapa sent at 10/03/2023  2:03 PM EDT ----- Please notify patient of labs reviewed normal serum potassium and a stable renal function.

## 2023-10-19 DIAGNOSIS — M25511 Pain in right shoulder: Secondary | ICD-10-CM | POA: Diagnosis not present

## 2023-10-19 DIAGNOSIS — N289 Disorder of kidney and ureter, unspecified: Secondary | ICD-10-CM | POA: Diagnosis not present

## 2023-10-19 DIAGNOSIS — M150 Primary generalized (osteo)arthritis: Secondary | ICD-10-CM | POA: Diagnosis not present

## 2023-10-19 DIAGNOSIS — M0589 Other rheumatoid arthritis with rheumatoid factor of multiple sites: Secondary | ICD-10-CM | POA: Diagnosis not present

## 2023-10-25 ENCOUNTER — Other Ambulatory Visit: Payer: Self-pay

## 2023-10-25 DIAGNOSIS — E119 Type 2 diabetes mellitus without complications: Secondary | ICD-10-CM

## 2023-10-25 MED ORDER — METFORMIN HCL ER 500 MG PO TB24: ORAL_TABLET | ORAL | 2 refills | Status: DC

## 2023-11-03 ENCOUNTER — Other Ambulatory Visit: Payer: Self-pay | Admitting: Cardiovascular Disease

## 2023-11-06 DIAGNOSIS — Z1231 Encounter for screening mammogram for malignant neoplasm of breast: Secondary | ICD-10-CM | POA: Diagnosis not present

## 2023-11-15 DIAGNOSIS — R296 Repeated falls: Secondary | ICD-10-CM | POA: Diagnosis not present

## 2023-11-15 DIAGNOSIS — Z6828 Body mass index (BMI) 28.0-28.9, adult: Secondary | ICD-10-CM | POA: Diagnosis not present

## 2023-11-15 DIAGNOSIS — M25511 Pain in right shoulder: Secondary | ICD-10-CM | POA: Diagnosis not present

## 2023-11-15 DIAGNOSIS — Z9181 History of falling: Secondary | ICD-10-CM | POA: Diagnosis not present

## 2023-11-15 DIAGNOSIS — S0093XA Contusion of unspecified part of head, initial encounter: Secondary | ICD-10-CM | POA: Diagnosis not present

## 2023-11-24 ENCOUNTER — Ambulatory Visit: Payer: Medicare HMO | Attending: Family Medicine

## 2023-11-24 DIAGNOSIS — R293 Abnormal posture: Secondary | ICD-10-CM

## 2023-11-24 DIAGNOSIS — R2681 Unsteadiness on feet: Secondary | ICD-10-CM

## 2023-11-24 DIAGNOSIS — R2689 Other abnormalities of gait and mobility: Secondary | ICD-10-CM | POA: Diagnosis not present

## 2023-11-24 DIAGNOSIS — M6281 Muscle weakness (generalized): Secondary | ICD-10-CM | POA: Diagnosis not present

## 2023-11-24 NOTE — Therapy (Signed)
OUTPATIENT PHYSICAL THERAPY NEURO EVALUATION   Patient Name: April [name] MRN: [id number] DOB:09/28/1941, 82 y.o., female Today's Date: 11/24/2023   PCP: Sigmund Hazel, MD REFERRING PROVIDER: Shireen Quan, DO  END OF SESSION:  PT End of Session - 11/24/23 0948     Visit Number 1    Number of Visits 13    Date for PT Re-Evaluation 01/19/24    Authorization Type [employer] medicare    Progress Note Due on Visit 10    PT Start Time 1015    PT Stop Time 1054    PT Time Calculation (min) 39 min    Equipment Utilized During Treatment Gait belt    Activity Tolerance Patient tolerated treatment well    Behavior During Therapy WFL for tasks assessed/performed             Past Medical History:  Diagnosis Date   Allergic rhinitis    Diabetes mellitus    TYPE 2   Dyslipidemia    Hyperkalemia    Hypertension    Insomnia    Pt stated no trouble falling or staying asleep.   RA (rheumatoid arthritis) (HCC)    Past Surgical History:  Procedure Laterality Date   BREAST CYST EXCISION Right 1967   HAND TENDON SURGERY     Right   OVARIAN CYST SURGERY Right 1988   Patient Active Problem List   Diagnosis Date Noted   Iron deficiency anemia 01/20/2019   Encounter for long-term (current) use of other medications 02/29/2012   Hypertension    Diabetes mellitus    Hypokalemia    RA (rheumatoid arthritis) (HCC)    EDEMA 08/27/2010   CARPAL TUNNEL SYNDROME, BILATERAL 10/28/2009   NUMBNESS 10/28/2009   Diabetes mellitus without complication (HCC) 09/20/2007   Hyperlipidemia 09/19/2007   Allergic rhinitis 09/19/2007   INSOMNIA 09/19/2007    ONSET DATE: 11/16/2023 referral  REFERRING DIAG: R29.6 (ICD-10-CM) - Falls frequently  THERAPY DIAG:  Muscle weakness (generalized) - Plan: PT plan of care cert/re-cert  Other abnormalities of gait and mobility - Plan: PT plan of care cert/re-cert  Unsteadiness on feet - Plan: PT plan of care cert/re-cert  Abnormal posture - Plan:  PT plan of care cert/re-cert  Rationale for Evaluation and Treatment: Rehabilitation  SUBJECTIVE:                                                                                                                                                                                             SUBJECTIVE STATEMENT: Patient arrives to PT alone, no AD. States "I'm standing upright." She has had a few falls recently. Patient reaching outside  her BOS it seems and then she fell. Fall occurred in her hallway and the light was off, which may have contributed to her LOB.  Pt accompanied by: self  PERTINENT HISTORY: arthritis, DM, HTN, RA  PAIN:  Are you having pain? Yes: NPRS scale: 4/10 Pain location: b knees Pain description: achy  PRECAUTIONS: Fall   WEIGHT BEARING RESTRICTIONS: No  FALLS: Has patient fallen in last 6 months? Yes. Number of falls at least 3, see above  LIVING ENVIRONMENT: Lives with: lives with their family Lives in: House/apartment Stairs: Yes: Internal: flight steps; on right going up and External: a few steps; none Has following equipment at home:  as access to some  PLOF: Independent  PATIENT GOALS: to not fall  OBJECTIVE:  Note: Objective measures were completed at Evaluation unless otherwise noted.  DIAGNOSTIC FINDINGS: none recent  COGNITION: Overall cognitive status: Within functional limits for tasks assessed   SENSATION: WFL  COORDINATION: Slow, intentional movements heel shin, figure 8 B LE Slow RAM, otherwise normal   POSTURE: No Significant postural limitations   LOWER EXTREMITY MMT:    MMT Right Eval Left Eval  Hip flexion 3+ 3+  Hip extension    Hip abduction 4 4  Hip adduction 3+ 3+  Hip internal rotation    Hip external rotation    Knee flexion 3+ 3+  Knee extension 4 4  Ankle dorsiflexion 4 4  Ankle plantarflexion    Ankle inversion    Ankle eversion    (Blank rows = not tested)  BED MOBILITY:  Some difficulty since fall on  B knees, but independent    GAIT: Gait pattern: step through pattern, decreased stride length, Right foot flat, Left foot flat, shuffling, trendelenburg, trunk flexed, narrow BOS, poor foot clearance- Right, and poor foot clearance- Left Distance walked: clinic Assistive device utilized: None Level of assistance: Modified independence Comments: slow gait speed  FUNCTIONAL TESTS:   [facility] PT Assessment - 11/24/23 0001       Standardized Balance Assessment   Standardized Balance Assessment 10 meter walk test    Five times sit to stand comments  32.06   no UE   10 Meter Walk .95m/s   no AD     Berg Balance Test   Sit to Stand Able to stand without using hands and stabilize independently    Standing Unsupported Able to stand safely 2 minutes    Sitting with Back Unsupported but Feet Supported on Floor or Stool Able to sit safely and securely 2 minutes    Stand to Sit Sits safely with minimal use of hands    Transfers Able to transfer safely, minor use of hands    Standing Unsupported with Eyes Closed Able to stand 10 seconds with supervision    Standing Unsupported with Feet Together Able to place feet together independently and stand for 1 minute with supervision    From Standing, Reach Forward with Outstretched Arm Can reach forward >5 cm safely (2")    From Standing Position, Pick up Object from Floor Able to pick up shoe, needs supervision    From Standing Position, Turn to Look Behind Over each Shoulder Looks behind one side only/other side shows less weight shift    Turn 360 Degrees Able to turn 360 degrees safely but slowly    Standing Unsupported, Alternately Place Feet on Step/Stool Able to complete 4 steps without aid or supervision    Standing Unsupported, One Foot in Front Able to take small  step independently and hold 30 seconds    Standing on One Leg Able to lift leg independently and hold 5-10 seconds    Total Score 43      Timed Up and Go Test   Normal TUG (seconds)  17.19   no AD             PATIENT SURVEYS:  ABC scale 72.5%                                                                                                                              TREATMENT: N/a eval   PATIENT EDUCATION: Education details: PT POC, exam findings Person educated: Patient Education method: Explanation Education comprehension: verbalized understanding  HOME EXERCISE PROGRAM: To be provided  GOALS: Goals reviewed with patient? Yes  SHORT TERM GOALS: Target date: 12/22/23  Pt will be independent with initial HEP for improved balance and functional strength  Baseline: to be provided Goal status: INITIAL  2.  Pt will improve TUG to </= 13 secs to demonstrated reduced fall risk  Baseline: 17.19s no AD Goal status: INITIAL  3.  Patient will score >/= 52/56 on the BBS to demonstrate improved balance Baseline: 43/56 Goal status: INITIAL  4.  Pt will improve gait speed to >/= .72m/s to demonstrate improved community ambulation  Baseline: .2m/s Goal status: INITIAL  5.  Pt will improve 5x STS to </= 25 sec to demo improved functional LE strength and balance   Baseline: 32.06s no UE Goal status: INITIAL  6.  MCTSIB to be assessed and LTG added Baseline: to be assessed Goal status: INITIAL  LONG TERM GOALS: Target date: 01/19/24  Pt will be independent with final HEP for improved balance and functional strength  Baseline: to be provided Goal status: INITIAL  Pt will improve gait speed to >/= .44m/s to demonstrate improved community ambulation  Baseline: .3m/s Goal status: INITIAL  Pt will improve 5x STS to </= 20 sec to demo improved functional LE strength and balance   Baseline: 32.06s no UE Goal status: INITIAL  MCTSIB to be assessed  Baseline: to be assessed Goal status: INITIAL  ASSESSMENT:  CLINICAL IMPRESSION: Patient is a 82 y.o. female who was seen today for physical therapy evaluation and treatment for imbalance and falls.  She has had multiple falls in the recent past, most of which occurred when it was dark out and she was reaching outside her BOS, suggesting poor balance strategies. Five times Sit to Stand Test (FTSS) Method: Use a straight back chair with a solid seat that is 17-18" high. Ask participant to sit on the chair with arms folded across their chest.   Instructions: "Stand up and sit down as quickly as possible 5 times, keeping your arms folded across your chest."   Measurement: Stop timing when the participant touches the chair in sitting the 5th time.  TIME: 32.06 sec  Cut off scores indicative of increased fall risk: >12 sec  CVA, >16 sec PD, >13 sec vestibular (ANPTA Core Set of Outcome Measures for Adults with Neurologic Conditions, 2018). 10 Meter Walk Test: Patient instructed to walk 10 meters (32.8 ft) as quickly and as safely as possible at their normal speed x2 and at a fast speed x2. Time measured from 2 meter mark to 8 meter mark to accommodate ramp-up and ramp-down.  Normal speed: .45m/s Cut off scores: <0.4 m/s = household Ambulator, 0.4-0.8 m/s = limited community Ambulator, >0.8 m/s = community Ambulator, >1.2 m/s = crossing a street, <1.0 = increased fall risk MCID 0.05 m/s (small), 0.13 m/s (moderate), 0.06 m/s (significant)  (ANPTA Core Set of Outcome Measures for Adults with Neurologic Conditions, 2018). Patient demonstrated increased fall risk noted by score of 43/56 on the [facility] Scale.  <45/56 = fall risk, <42/56 = predictive of recurrent falls, <40/56 = 100% fall risk  >41 = independent, 21-40 = assistive device, 0-20 = wheelchair level  MDC 6.9 (4 pts 45-56, 5 pts 35-44, 7 pts 25-34) (ANPTA Core Set of Outcome Measures for Adults with Neurologic Conditions, 2018). Patient completed the Timed Up and Go test (TUG) in 17.19 seconds.  Geriatrics: need for further assessment of fall risk: >/= 12 sec; Recurrent falls: > 15 sec; Vestibular Disorders fall risk: > 15 sec;  Parkinson's Disease fall risk: > 16 sec ([email or web address], 2023). She would benefit from skilled PT services to address the above mentioned deficits.        OBJECTIVE IMPAIRMENTS: Abnormal gait, cardiopulmonary status limiting activity, decreased balance, decreased endurance, decreased knowledge of condition, decreased knowledge of use of DME, difficulty walking, decreased strength, and pain.   ACTIVITY LIMITATIONS: carrying, lifting, squatting, stairs, locomotion level, and caring for others  PARTICIPATION LIMITATIONS: meal prep, cleaning, interpersonal relationship, driving, shopping, and community activity  PERSONAL FACTORS: Age, Past/current experiences, Time since onset of injury/illness/exacerbation, and 3+ comorbidities: see above  are also affecting patient's functional outcome.   REHAB POTENTIAL: Good  CLINICAL DECISION MAKING: Stable/uncomplicated  EVALUATION COMPLEXITY: Low  PLAN:  PT FREQUENCY: 2x/week  PT DURATION: 6 weeks  PLANNED INTERVENTIONS: 97164- PT Re-evaluation, 97110-Therapeutic exercises, 97530- Therapeutic activity, 97112- Neuromuscular re-education, 97535- Self Care, 84696- Manual therapy, [phone]- Gait training, [phone]- Orthotic Fit/training, [phone]- Canalith repositioning, [phone]- Aquatic Therapy, Patient/Family education, Balance training, Stair training, Dry Needling, Vestibular training, Visual/preceptual remediation/compensation, and DME instructions  PLAN FOR NEXT SESSION: HEP, functional strength, functional balance, stepping strategies, reaching outside BOS (neurocom limits of stability?)   Westley Foots, PT Westley Foots, PT, DPT, CBIS  11/24/2023, 11:34 AM

## 2023-12-05 ENCOUNTER — Ambulatory Visit: Payer: Medicare HMO | Admitting: Physical Therapy

## 2023-12-05 DIAGNOSIS — M6281 Muscle weakness (generalized): Secondary | ICD-10-CM

## 2023-12-05 DIAGNOSIS — R2689 Other abnormalities of gait and mobility: Secondary | ICD-10-CM

## 2023-12-05 DIAGNOSIS — R2681 Unsteadiness on feet: Secondary | ICD-10-CM | POA: Diagnosis not present

## 2023-12-05 DIAGNOSIS — R293 Abnormal posture: Secondary | ICD-10-CM

## 2023-12-05 NOTE — Therapy (Signed)
 OUTPATIENT PHYSICAL THERAPY NEURO TREATMENT   Patient Name: April [name] MRN: [id number] DOB:1941/01/22, 82 y.o., female Today's Date: 12/05/2023   PCP: Olam Pinal, MD REFERRING PROVIDER: Anastasio Duncans, DO  END OF SESSION:  PT End of Session - 12/05/23 0937     Visit Number 2    Number of Visits 13    Date for PT Re-Evaluation 01/19/24    Authorization Type [employer] medicare    Progress Note Due on Visit 10    PT Start Time 0935    PT Stop Time 1018    PT Time Calculation (min) 43 min    Equipment Utilized During Treatment Gait belt    Activity Tolerance Patient tolerated treatment well    Behavior During Therapy WFL for tasks assessed/performed              Past Medical History:  Diagnosis Date   Allergic rhinitis    Diabetes mellitus    TYPE 2   Dyslipidemia    Hyperkalemia    Hypertension    Insomnia    Pt stated no trouble falling or staying asleep.   RA (rheumatoid arthritis) (HCC)    Past Surgical History:  Procedure Laterality Date   BREAST CYST EXCISION Right 1967   HAND TENDON SURGERY     Right   OVARIAN CYST SURGERY Right 1988   Patient Active Problem List   Diagnosis Date Noted   Iron  deficiency anemia 01/20/2019   Encounter for long-term (current) use of other medications 02/29/2012   Hypertension    Diabetes mellitus    Hypokalemia    RA (rheumatoid arthritis) (HCC)    EDEMA 08/27/2010   CARPAL TUNNEL SYNDROME, BILATERAL 10/28/2009   NUMBNESS 10/28/2009   Diabetes mellitus without complication (HCC) 09/20/2007   Hyperlipidemia 09/19/2007   Allergic rhinitis 09/19/2007   INSOMNIA 09/19/2007    ONSET DATE: 11/16/2023 referral  REFERRING DIAG: R29.6 (ICD-10-CM) - Falls frequently  THERAPY DIAG:  Muscle weakness (generalized)  Other abnormalities of gait and mobility  Unsteadiness on feet  Abnormal posture  Rationale for Evaluation and Treatment: Rehabilitation  SUBJECTIVE:                                                                                                                                                                                              SUBJECTIVE STATEMENT: Pt reports that she has had a couple more intense falls since last visit, not sure what happened but felt like she was floundering in a lake. Pt then recounts an incident in which she fell outdoors because she was walking her dogs and they got their leashes  tangled up around her legs. She had no awareness that this was happening she just found herself off balance and couldn't recover, her neighbor witnessed the event and told her what had happened. Pt reports whole body soreness after her falls from her ribcage, to her back, to her legs. Pt also reports that when she falls she tends to land on her knees and her wrists, always falls forwards. Pt reports that she is sore also from her arthritis. Pt is having trouble getting comfortable sitting down or laying down since everything hurts. Pt usually has to have her husband or her brother help her back up from a fall. Pt has not sought out medical care following these falls, encouraged to follow up with her PCP if her pain persists.  Pt accompanied by: self  PERTINENT HISTORY: arthritis, DM, HTN, RA  PAIN:  Are you having pain? Yes: NPRS scale: 8/10 Pain location: whole body Pain description: achy  PRECAUTIONS: Fall   WEIGHT BEARING RESTRICTIONS: No  FALLS: Has patient fallen in last 6 months? Yes. Number of falls at least 3, see above  LIVING ENVIRONMENT: Lives with: lives with their family Lives in: House/apartment Stairs: Yes: Internal: flight steps; on right going up and External: a few steps; none Has following equipment at home:  as access to some  PLOF: Independent  PATIENT GOALS: to not fall  OBJECTIVE:  Note: Objective measures were completed at Evaluation unless otherwise noted.  DIAGNOSTIC FINDINGS: none recent  COGNITION: Overall cognitive status: Within  functional limits for tasks assessed   SENSATION: WFL  COORDINATION: Slow, intentional movements heel shin, figure 8 B LE Slow RAM, otherwise normal   POSTURE: No Significant postural limitations   LOWER EXTREMITY MMT:    MMT Right Eval Left Eval  Hip flexion 3+ 3+  Hip extension    Hip abduction 4 4  Hip adduction 3+ 3+  Hip internal rotation    Hip external rotation    Knee flexion 3+ 3+  Knee extension 4 4  Ankle dorsiflexion 4 4  Ankle plantarflexion    Ankle inversion    Ankle eversion    (Blank rows = not tested)  BED MOBILITY:  Some difficulty since fall on B knees, but independent    GAIT: Gait pattern: step through pattern, decreased stride length, Right foot flat, Left foot flat, shuffling, trendelenburg, trunk flexed, narrow BOS, poor foot clearance- Right, and poor foot clearance- Left Distance walked: clinic Assistive device utilized: None Level of assistance: Modified independence Comments: slow gait speed  FUNCTIONAL TESTS:      PATIENT SURVEYS:  ABC scale 72.5%                                                                                                                              TREATMENT: TherAct mCTSIB Condition 1: 30 sec Condition 2: 30 sec Condition 3: 30 sec Condition 4: 15 sec  Corner balance to trial various challenges to  create a safe and appropriate HEP: Romberg stance with EC x 30 sec Modified tandem stance with EC L/R x 30 sec each Normal stance on 2 pillows x 30 sec With EC x 30 sec With horizontal head turns With vertical head turns  Added appropriate exercises to HEP, see bolded below  Stance on rockerboard in A/P direction in // bars: 3 x 30 sec each Focus on recovery of LOB forwards and backwards, min A for balance    PATIENT EDUCATION: Education details: results of mCTSIB, initiated HEP, balance strategies and balance systems Person educated: Patient Education method: Explanation, Demonstration,  Verbal cues, and Handouts Education comprehension: verbalized understanding and returned demonstration  HOME EXERCISE PROGRAM: Access Code: [email or web address] URL: [email or web address] Date: 12/05/2023 Prepared by: Waddell Southgate  Exercises - Standing Balance with Eyes Closed on Foam  - 1 x daily - 7 x weekly - 1 sets - 5 reps - 30 sec hold - Romberg Stance on Foam Pad  - 1 x daily - 7 x weekly - 1 sets - 5 reps - 30 sec hold - Wide Stance with Head Nods on Foam Pad  - 1 x daily - 7 x weekly - 1 sets - 10 reps - Wide Stance with Head Rotation on Foam Pad  - 1 x daily - 7 x weekly - 1 sets - 10 reps  GOALS: Goals reviewed with patient? Yes  SHORT TERM GOALS: Target date: 12/22/23  Pt will be independent with initial HEP for improved balance and functional strength  Baseline: to be provided Goal status: INITIAL  2.  Pt will improve TUG to </= 13 secs to demonstrated reduced fall risk  Baseline: 17.19s no AD Goal status: INITIAL  3.  Patient will score >/= 52/56 on the BBS to demonstrate improved balance Baseline: 43/56 Goal status: INITIAL  4.  Pt will improve gait speed to >/= .59m/s to demonstrate improved community ambulation  Baseline: .39m/s Goal status: INITIAL  5.  Pt will improve 5x STS to </= 25 sec to demo improved functional LE strength and balance   Baseline: 32.06s no UE Goal status: INITIAL  6.  MCTSIB to be assessed and LTG added Baseline: to be assessed, assessed 12/31 - see LTG Goal status: MET  LONG TERM GOALS: Target date: 01/19/24  Pt will be independent with final HEP for improved balance and functional strength  Baseline: to be provided Goal status: INITIAL  Pt will improve gait speed to >/= .39m/s to demonstrate improved community ambulation  Baseline: .33m/s Goal status: INITIAL  Pt will improve 5x STS to </= 20 sec to demo improved functional LE strength and balance   Baseline: 32.06s no UE Goal status: INITIAL  Pt will improve  her score on Condition 4 of the mCTSIB to 30 seconds Baseline: 15 sec (12/31) Goal status: INITIAL  ASSESSMENT:  CLINICAL IMPRESSION: Emphasis of skilled PT session on assessing mCTSIB and initiating HEP to work on balance strategies and balance recovery. Pt with most difficulty with Condition 4 of mCTSIB so HEP focused on tasks on compliant surfaces. Pt also with difficulty maintaining balance on rockerboard but exhibits improved understanding of weight shift and use of ankle and hip strategies as session progresses. Pt can benefit from continued practice of static and dynamic balance tasks and working on stepping strategy for increased safety and independence with functional mobility and on decreasing her fall risk. Continue POC.      OBJECTIVE IMPAIRMENTS: Abnormal gait, cardiopulmonary status limiting activity, decreased balance, decreased  endurance, decreased knowledge of condition, decreased knowledge of use of DME, difficulty walking, decreased strength, and pain.   ACTIVITY LIMITATIONS: carrying, lifting, squatting, stairs, locomotion level, and caring for others  PARTICIPATION LIMITATIONS: meal prep, cleaning, interpersonal relationship, driving, shopping, and community activity  PERSONAL FACTORS: Age, Past/current experiences, Time since onset of injury/illness/exacerbation, and 3+ comorbidities: see above  are also affecting patient's functional outcome.   REHAB POTENTIAL: Good  CLINICAL DECISION MAKING: Stable/uncomplicated  EVALUATION COMPLEXITY: Low  PLAN:  PT FREQUENCY: 2x/week  PT DURATION: 6 weeks  PLANNED INTERVENTIONS: 97164- PT Re-evaluation, 97110-Therapeutic exercises, 97530- Therapeutic activity, [id number]- Neuromuscular re-education, 97535- Self Care, 02859- Manual therapy, [phone]- Gait training, [phone]- Orthotic Fit/training, [phone]- Canalith repositioning, [phone]- Aquatic Therapy, Patient/Family education, Balance training, Stair training, Dry Needling, Vestibular  training, Visual/preceptual remediation/compensation, and DME instructions  PLAN FOR NEXT SESSION: how is initial HEP? Add to HEP PRN, functional strength, functional balance, stepping strategies, reaching outside BOS (neurocom limits of stability?), rockerboard   Waddell Southgate, PT Waddell Southgate, PT, DPT, CSRS   12/05/2023, 10:18 AM

## 2023-12-07 ENCOUNTER — Ambulatory Visit: Payer: Medicare HMO

## 2023-12-12 ENCOUNTER — Ambulatory Visit: Payer: Medicare HMO | Attending: Family Medicine

## 2023-12-12 DIAGNOSIS — R293 Abnormal posture: Secondary | ICD-10-CM | POA: Insufficient documentation

## 2023-12-12 DIAGNOSIS — M542 Cervicalgia: Secondary | ICD-10-CM | POA: Insufficient documentation

## 2023-12-12 DIAGNOSIS — R2681 Unsteadiness on feet: Secondary | ICD-10-CM | POA: Insufficient documentation

## 2023-12-12 DIAGNOSIS — M6281 Muscle weakness (generalized): Secondary | ICD-10-CM | POA: Insufficient documentation

## 2023-12-12 DIAGNOSIS — M62838 Other muscle spasm: Secondary | ICD-10-CM | POA: Insufficient documentation

## 2023-12-12 DIAGNOSIS — R2689 Other abnormalities of gait and mobility: Secondary | ICD-10-CM | POA: Insufficient documentation

## 2023-12-14 ENCOUNTER — Ambulatory Visit: Payer: Medicare HMO | Admitting: Physical Therapy

## 2023-12-14 DIAGNOSIS — Z0000 Encounter for general adult medical examination without abnormal findings: Secondary | ICD-10-CM | POA: Diagnosis not present

## 2023-12-14 DIAGNOSIS — Z6828 Body mass index (BMI) 28.0-28.9, adult: Secondary | ICD-10-CM | POA: Diagnosis not present

## 2023-12-14 DIAGNOSIS — Z1331 Encounter for screening for depression: Secondary | ICD-10-CM | POA: Diagnosis not present

## 2023-12-18 DIAGNOSIS — M5412 Radiculopathy, cervical region: Secondary | ICD-10-CM | POA: Diagnosis not present

## 2023-12-18 DIAGNOSIS — M47817 Spondylosis without myelopathy or radiculopathy, lumbosacral region: Secondary | ICD-10-CM | POA: Diagnosis not present

## 2023-12-18 DIAGNOSIS — M25562 Pain in left knee: Secondary | ICD-10-CM | POA: Diagnosis not present

## 2023-12-19 ENCOUNTER — Ambulatory Visit: Payer: Medicare HMO

## 2023-12-19 DIAGNOSIS — M6281 Muscle weakness (generalized): Secondary | ICD-10-CM | POA: Diagnosis not present

## 2023-12-19 DIAGNOSIS — R2689 Other abnormalities of gait and mobility: Secondary | ICD-10-CM

## 2023-12-19 DIAGNOSIS — R293 Abnormal posture: Secondary | ICD-10-CM

## 2023-12-19 DIAGNOSIS — R2681 Unsteadiness on feet: Secondary | ICD-10-CM | POA: Diagnosis not present

## 2023-12-19 DIAGNOSIS — M542 Cervicalgia: Secondary | ICD-10-CM | POA: Diagnosis not present

## 2023-12-19 DIAGNOSIS — M62838 Other muscle spasm: Secondary | ICD-10-CM | POA: Diagnosis not present

## 2023-12-19 NOTE — Therapy (Signed)
 OUTPATIENT PHYSICAL THERAPY NEURO TREATMENT   Patient Name: April [name] MRN: [id number] DOB:[birth year], 83 y.o., female Today's Date: 12/19/2023   PCP: Olam Pinal, MD REFERRING PROVIDER: Anastasio Duncans, DO  END OF SESSION:  PT End of Session - 12/19/23 1005     Visit Number 3    Number of Visits 13    Date for PT Re-Evaluation 01/19/24    Authorization Type [employer] medicare    Progress Note Due on Visit 10    PT Start Time 1015    PT Stop Time 1100    PT Time Calculation (min) 45 min    Equipment Utilized During Treatment Gait belt    Activity Tolerance Patient tolerated treatment well    Behavior During Therapy WFL for tasks assessed/performed              Past Medical History:  Diagnosis Date   Allergic rhinitis    Diabetes mellitus    TYPE 2   Dyslipidemia    Hyperkalemia    Hypertension    Insomnia    Pt stated no trouble falling or staying asleep.   RA (rheumatoid arthritis) (HCC)    Past Surgical History:  Procedure Laterality Date   BREAST CYST EXCISION Right 1967   HAND TENDON SURGERY     Right   OVARIAN CYST SURGERY Right 1988   Patient Active Problem List   Diagnosis Date Noted   Iron  deficiency anemia 01/20/2019   Encounter for long-term (current) use of other medications 02/29/2012   Hypertension    Diabetes mellitus    Hypokalemia    RA (rheumatoid arthritis) (HCC)    EDEMA 08/27/2010   CARPAL TUNNEL SYNDROME, BILATERAL 10/28/2009   NUMBNESS 10/28/2009   Diabetes mellitus without complication (HCC) 09/20/2007   Hyperlipidemia 09/19/2007   Allergic rhinitis 09/19/2007   INSOMNIA 09/19/2007    ONSET DATE: 11/16/2023 referral  REFERRING DIAG: R29.6 (ICD-10-CM) - Falls frequently  THERAPY DIAG:  Muscle weakness (generalized)  Other abnormalities of gait and mobility  Unsteadiness on feet  Abnormal posture  Rationale for Evaluation and Treatment: Rehabilitation  SUBJECTIVE:                                                                                                                                                                                              SUBJECTIVE STATEMENT: Patient arrives to clinic alone. Reports a slip but then denies falls. Did burn her hands on some kitchen utensil since last appt.   Pt accompanied by: self  PERTINENT HISTORY: arthritis, DM, HTN, RA  PAIN:  Are you having pain? Yes: NPRS scale: 8/10 Pain  location: whole body Pain description: achy  PRECAUTIONS: Fall  PATIENT GOALS: to not fall  OBJECTIVE:  Note: Objective measures were completed at Evaluation unless otherwise noted.  DIAGNOSTIC FINDINGS: none recent                                                                                                                               TREATMENT: TherAct -Neurocom limits of stability  -decreased anterior excursion with anterior and R with slow reaction time to prompt -blaze pods toe tap standing on airex   -> distracting colors increased LOB but able to regain balance using stepping strategy -ankle AROM coordination on wobble board seated  -increased challenge with L LE   PATIENT EDUCATION: Education details: fall precautions, continue HEP Person educated: Patient Education method: Explanation, Demonstration, Verbal cues, and Handouts Education comprehension: verbalized understanding and returned demonstration  HOME EXERCISE PROGRAM: Access Code: [email or web address] URL: [email or web address] Date: 12/05/2023 Prepared by: Waddell Southgate  Exercises - Standing Balance with Eyes Closed on Foam  - 1 x daily - 7 x weekly - 1 sets - 5 reps - 30 sec hold - Romberg Stance on Foam Pad  - 1 x daily - 7 x weekly - 1 sets - 5 reps - 30 sec hold - Wide Stance with Head Nods on Foam Pad  - 1 x daily - 7 x weekly - 1 sets - 10 reps - Wide Stance with Head Rotation on Foam Pad  - 1 x daily - 7 x weekly - 1 sets - 10 reps  GOALS: Goals reviewed with patient?  Yes  SHORT TERM GOALS: Target date: 12/22/23  Pt will be independent with initial HEP for improved balance and functional strength  Baseline: to be provided Goal status: INITIAL  2.  Pt will improve TUG to </= 13 secs to demonstrated reduced fall risk  Baseline: 17.19s no AD Goal status: INITIAL  3.  Patient will score >/= 52/56 on the BBS to demonstrate improved balance Baseline: 43/56 Goal status: INITIAL  4.  Pt will improve gait speed to >/= .11m/s to demonstrate improved community ambulation  Baseline: .23m/s Goal status: INITIAL  5.  Pt will improve 5x STS to </= 25 sec to demo improved functional LE strength and balance   Baseline: 32.06s no UE Goal status: INITIAL  6.  MCTSIB to be assessed and LTG added Baseline: to be assessed, assessed 12/31 - see LTG Goal status: MET  LONG TERM GOALS: Target date: 01/19/24  Pt will be independent with final HEP for improved balance and functional strength  Baseline: to be provided Goal status: INITIAL  Pt will improve gait speed to >/= .50m/s to demonstrate improved community ambulation  Baseline: .67m/s Goal status: INITIAL  Pt will improve 5x STS to </= 20 sec to demo improved functional LE strength and balance   Baseline: 32.06s no UE Goal status: INITIAL  Pt will improve her score on Condition 4 of the  mCTSIB to 30 seconds Baseline: 15 sec (12/31) Goal status: INITIAL  ASSESSMENT:  CLINICAL IMPRESSION: Patient seen for skilled PT session with emphasis on balance retraining. Demonstrating limited excursion outside BOS and preference for stepping strategy before ankle or hip strategy. With mild dual task overlay, such as distracting colors in Blaze pods, patient with increased instances of LOB. PT discussed implications of minimizing dual tasking with patient. She verbalized understanding. Continue POC.   OBJECTIVE IMPAIRMENTS: Abnormal gait, cardiopulmonary status limiting activity, decreased balance, decreased  endurance, decreased knowledge of condition, decreased knowledge of use of DME, difficulty walking, decreased strength, and pain.   ACTIVITY LIMITATIONS: carrying, lifting, squatting, stairs, locomotion level, and caring for others  PARTICIPATION LIMITATIONS: meal prep, cleaning, interpersonal relationship, driving, shopping, and community activity  PERSONAL FACTORS: Age, Past/current experiences, Time since onset of injury/illness/exacerbation, and 3+ comorbidities: see above  are also affecting patient's functional outcome.   REHAB POTENTIAL: Good  CLINICAL DECISION MAKING: Stable/uncomplicated  EVALUATION COMPLEXITY: Low  PLAN:  PT FREQUENCY: 2x/week  PT DURATION: 6 weeks  PLANNED INTERVENTIONS: 97164- PT Re-evaluation, 97110-Therapeutic exercises, 97530- Therapeutic activity, [id number]- Neuromuscular re-education, 97535- Self Care, 02859- Manual therapy, [phone]- Gait training, [phone]- Orthotic Fit/training, [phone]- Canalith repositioning, [phone]- Aquatic Therapy, Patient/Family education, Balance training, Stair training, Dry Needling, Vestibular training, Visual/preceptual remediation/compensation, and DME instructions  PLAN FOR NEXT SESSION: how is initial HEP? Add to HEP PRN, functional strength, functional balance, stepping strategies, reaching outside BOS, rockerboard, CHECK STG   Delon DELENA Pop, PT Delon DELENA Pop, PT, DPT, CBIS 12/19/2023, 11:33 AM

## 2023-12-21 ENCOUNTER — Ambulatory Visit: Payer: Medicare HMO | Admitting: Physical Therapy

## 2023-12-21 DIAGNOSIS — M542 Cervicalgia: Secondary | ICD-10-CM

## 2023-12-21 DIAGNOSIS — R2681 Unsteadiness on feet: Secondary | ICD-10-CM | POA: Diagnosis not present

## 2023-12-21 DIAGNOSIS — M62838 Other muscle spasm: Secondary | ICD-10-CM

## 2023-12-21 DIAGNOSIS — R293 Abnormal posture: Secondary | ICD-10-CM

## 2023-12-21 DIAGNOSIS — M6281 Muscle weakness (generalized): Secondary | ICD-10-CM | POA: Diagnosis not present

## 2023-12-21 DIAGNOSIS — R2689 Other abnormalities of gait and mobility: Secondary | ICD-10-CM

## 2023-12-21 NOTE — Therapy (Signed)
OUTPATIENT PHYSICAL THERAPY NEURO TREATMENT   Patient Name: April [name] MRN: [id number] DOB:1941-07-24, 83 y.o., female Today's Date: 12/21/2023   PCP: Sigmund Hazel, MD REFERRING PROVIDER: Shireen Quan, DO  END OF SESSION:  PT End of Session - 12/21/23 0853     Visit Number 4    Number of Visits 13    Date for PT Re-Evaluation 01/19/24    Authorization Type [employer] medicare    Progress Note Due on Visit 10    PT Start Time [phone]   pt agreeable to start early   PT Stop Time 0940    PT Time Calculation (min) 47 min    Equipment Utilized During Treatment Gait belt    Activity Tolerance Patient tolerated treatment well    Behavior During Therapy WFL for tasks assessed/performed               Past Medical History:  Diagnosis Date   Allergic rhinitis    Diabetes mellitus    TYPE 2   Dyslipidemia    Hyperkalemia    Hypertension    Insomnia    Pt stated no trouble falling or staying asleep.   RA (rheumatoid arthritis) (HCC)    Past Surgical History:  Procedure Laterality Date   BREAST CYST EXCISION Right 1967   HAND TENDON SURGERY     Right   OVARIAN CYST SURGERY Right 1988   Patient Active Problem List   Diagnosis Date Noted   Iron deficiency anemia 01/20/2019   Encounter for long-term (current) use of other medications 02/29/2012   Hypertension    Diabetes mellitus    Hypokalemia    RA (rheumatoid arthritis) (HCC)    EDEMA 08/27/2010   CARPAL TUNNEL SYNDROME, BILATERAL 10/28/2009   NUMBNESS 10/28/2009   Diabetes mellitus without complication (HCC) 09/20/2007   Hyperlipidemia 09/19/2007   Allergic rhinitis 09/19/2007   INSOMNIA 09/19/2007    ONSET DATE: 11/16/2023 referral  REFERRING DIAG: R29.6 (ICD-10-CM) - Falls frequently  THERAPY DIAG:  Muscle weakness (generalized)  Unsteadiness on feet  Other abnormalities of gait and mobility  Abnormal posture  Cervicalgia  Other muscle spasm  Rationale for Evaluation and Treatment:  Rehabilitation  SUBJECTIVE:                                                                                                                                                                                             SUBJECTIVE STATEMENT: Pt reports that her L knee continues to bother her, doesn't think it is swollen but it is painful if she puts pressre through it, her orthopedic is going to send her to a "knee  specialist". Pt also reports that she had a bad cramp in her R lower leg yesterday, rubbed some muscle cream on it and it felt better.  Pt declines any falls or acute changes since last visit. Pt reports that today her pain is 8/10 today due to cold weather and arthritis. Pt reports that despite it hurting she did shovel her driveway so it was less dangerous to walk on.  Pt accompanied by: self  PERTINENT HISTORY: arthritis, DM, HTN, RA  PAIN:  Are you having pain? Yes: NPRS scale: 8/10 Pain location: whole body Pain description: achy  PRECAUTIONS: Fall  PATIENT GOALS: to not fall  OBJECTIVE:  Note: Objective measures were completed at Evaluation unless otherwise noted.  DIAGNOSTIC FINDINGS: none recent                                                                                                                               TREATMENT: TherAct For STG assessment:  [facility] PT Assessment - 12/21/23 0902       Ambulation/Gait   Gait velocity - backwards 32.8 ft over 12.53 sec = 2.62 ft/sec   no AD     Standardized Balance Assessment   Standardized Balance Assessment Berg Balance Test;Five Times Sit to Stand;Timed Up and Go Test    Five times sit to stand comments  22.18 sec   hands on thighs     Berg Balance Test   Sit to Stand Able to stand without using hands and stabilize independently    Standing Unsupported Able to stand safely 2 minutes    Sitting with Back Unsupported but Feet Supported on Floor or Stool Able to sit safely and securely 2 minutes    Stand to Sit  Sits safely with minimal use of hands    Transfers Able to transfer safely, minor use of hands    Standing Unsupported with Eyes Closed Able to stand 10 seconds safely    Standing Unsupported with Feet Together Able to place feet together independently and stand 1 minute safely    From Standing, Reach Forward with Outstretched Arm Can reach forward >12 cm safely (5")    From Standing Position, Pick up Object from Floor Able to pick up shoe, needs supervision    From Standing Position, Turn to Look Behind Over each Shoulder Looks behind from both sides and weight shifts well    Turn 360 Degrees Able to turn 360 degrees safely one side only in 4 seconds or less    Standing Unsupported, Alternately Place Feet on Step/Stool Able to stand independently and safely and complete 8 steps in 20 seconds    Standing Unsupported, One Foot in Front Able to place foot tandem independently and hold 30 seconds    Standing on One Leg Able to lift leg independently and hold > 10 seconds    Total Score 53    Berg comment: 53/56, low fall risk  Timed Up and Go Test   TUG Normal TUG    Normal TUG (seconds) 12.47   no AD            6 Blaze pods on random setting for improved static standing balance reaching outside BOS as well as visual scanning.  Performed on 1 minute intervals with 30 rest periods.  Pt requires close SBA to CGA guarding. Round 1:  2 on floor, 4 on mirror setup.  25 hits. Round 2:  2 on floor, 4 on mirror  setup.  26 hits. Round 3:  2 on floor, 4 on mirror  setup.  28 hits. Notable errors/deficits:  decreased balance with attempts to reach across midline, needs more practice with this    PATIENT EDUCATION: Education details: results of OM and functional implications, continue HEP Person educated: Patient Education method: Explanation Education comprehension: verbalized understanding and needs further education  HOME EXERCISE PROGRAM: Access Code: [email or web address] URL:  [email or web address] Date: 12/05/2023 Prepared by: Peter Congo  Exercises - Standing Balance with Eyes Closed on Foam  - 1 x daily - 7 x weekly - 1 sets - 5 reps - 30 sec hold - Romberg Stance on Foam Pad  - 1 x daily - 7 x weekly - 1 sets - 5 reps - 30 sec hold - Wide Stance with Head Nods on Foam Pad  - 1 x daily - 7 x weekly - 1 sets - 10 reps - Wide Stance with Head Rotation on Foam Pad  - 1 x daily - 7 x weekly - 1 sets - 10 reps   GOALS: Goals reviewed with patient? Yes  SHORT TERM GOALS: Target date: 12/22/23  Pt will be independent with initial HEP for improved balance and functional strength  Baseline: to be provided Goal status: MET  2.  Pt will improve TUG to </= 13 secs to demonstrated reduced fall risk Baseline: 17.19s no AD, 12.47 sec no AD (1/16) Goal status: MET  3.  Patient will score >/= 52/56 on the BBS to demonstrate improved balance Baseline: 43/56, 53/56 (1/16) Goal status: MET  4.  Pt will improve gait speed to >/= .54m/s to demonstrate improved community ambulation Baseline: .57m/s, 0.79 m/s (1/16) Goal status: IN PROGRESS  5.  Pt will improve 5x STS to </= 25 sec to demo improved functional LE strength and balance  Baseline: 32.06s no UE, 22.18 sec hands on thighs  (1/16) Goal status: MET  6.  MCTSIB to be assessed and LTG added Baseline: to be assessed, assessed 12/31 - see LTG Goal status: MET   LONG TERM GOALS: Target date: 01/19/24  Pt will be independent with final HEP for improved balance and functional strength  Baseline: to be provided Goal status: INITIAL  Pt will improve gait speed to >/= .82m/s to demonstrate improved community ambulation Baseline: .55m/s, 0.79 m/s (1/16) Goal status: INITIAL  Pt will improve 5x STS to </= 20 sec to demo improved functional LE strength and balance  Baseline: 32.06s no UE, 22.18 sec hands on thighs  (1/16) Goal status: INITIAL  Pt will improve her score on Condition 4 of the  mCTSIB to 30 seconds Baseline: 15 sec (12/31) Goal status: INITIAL  ASSESSMENT:  CLINICAL IMPRESSION: Emphasis of skilled PT session on assessing STG and continuing to work on static standing balance. Pt has met 5/6 STG due to being independent with her HEP, improving her balance, and decreasing her fall risk. She did not quite meet her STG for improving  gait speed as she improved to 0.79 m/s and goal was 0.8 m/s. Overall she shows great improvement from her initial assessment in balance and functional strength. Pt does exhibit impaired static standing balance when reaching outside BOS and across midline this section, can benefit from continued practice of this. Continue POC.   OBJECTIVE IMPAIRMENTS: Abnormal gait, cardiopulmonary status limiting activity, decreased balance, decreased endurance, decreased knowledge of condition, decreased knowledge of use of DME, difficulty walking, decreased strength, and pain.   ACTIVITY LIMITATIONS: carrying, lifting, squatting, stairs, locomotion level, and caring for others  PARTICIPATION LIMITATIONS: meal prep, cleaning, interpersonal relationship, driving, shopping, and community activity  PERSONAL FACTORS: Age, Past/current experiences, Time since onset of injury/illness/exacerbation, and 3+ comorbidities: see above  are also affecting patient's functional outcome.   REHAB POTENTIAL: Good  CLINICAL DECISION MAKING: Stable/uncomplicated  EVALUATION COMPLEXITY: Low  PLAN:  PT FREQUENCY: 2x/week  PT DURATION: 6 weeks  PLANNED INTERVENTIONS: 97164- PT Re-evaluation, 97110-Therapeutic exercises, 97530- Therapeutic activity, [id number]- Neuromuscular re-education, 97535- Self Care, 69629- Manual therapy, [phone]- Gait training, [phone]- Orthotic Fit/training, [phone]- Canalith repositioning, [phone]- Aquatic Therapy, Patient/Family education, Balance training, Stair training, Dry Needling, Vestibular training, Visual/preceptual remediation/compensation, and DME  instructions  PLAN FOR NEXT SESSION: is initial HEP challenging enough? Upgrade/Add to HEP PRN, functional strength, functional balance, stepping strategies, reaching outside BOS and across midline (Blaze pods), rockerboard   Peter Congo, PT Peter Congo, PT, DPT, CSRS  12/21/2023, 9:45 AM

## 2023-12-26 ENCOUNTER — Ambulatory Visit: Payer: Medicare HMO | Admitting: Physical Therapy

## 2023-12-26 DIAGNOSIS — R293 Abnormal posture: Secondary | ICD-10-CM | POA: Diagnosis not present

## 2023-12-26 DIAGNOSIS — R2689 Other abnormalities of gait and mobility: Secondary | ICD-10-CM | POA: Diagnosis not present

## 2023-12-26 DIAGNOSIS — M542 Cervicalgia: Secondary | ICD-10-CM | POA: Diagnosis not present

## 2023-12-26 DIAGNOSIS — R2681 Unsteadiness on feet: Secondary | ICD-10-CM

## 2023-12-26 DIAGNOSIS — M6281 Muscle weakness (generalized): Secondary | ICD-10-CM

## 2023-12-26 DIAGNOSIS — M62838 Other muscle spasm: Secondary | ICD-10-CM | POA: Diagnosis not present

## 2023-12-26 NOTE — Therapy (Signed)
OUTPATIENT PHYSICAL THERAPY NEURO TREATMENT   Patient Name: KM. FRANCAVILLA MRN: [id number] DOB:03-07-1941, 83 y.o., female Today's Date: 12/26/2023   PCP: Sigmund Hazel, MD REFERRING PROVIDER: Shireen Quan, DO  END OF SESSION:  PT End of Session - 12/26/23 0936     Visit Number 5    Number of Visits 13    Date for PT Re-Evaluation 01/19/24    Authorization Type [employer] medicare    Progress Note Due on Visit 10    PT Start Time 0935   pt arrived late   PT Stop Time 1015    PT Time Calculation (min) 40 min    Equipment Utilized During Treatment Gait belt    Activity Tolerance Patient tolerated treatment well    Behavior During Therapy WFL for tasks assessed/performed                Past Medical History:  Diagnosis Date   Allergic rhinitis    Diabetes mellitus    TYPE 2   Dyslipidemia    Hyperkalemia    Hypertension    Insomnia    Pt stated no trouble falling or staying asleep.   RA (rheumatoid arthritis) (HCC)    Past Surgical History:  Procedure Laterality Date   BREAST CYST EXCISION Right 1967   HAND TENDON SURGERY     Right   OVARIAN CYST SURGERY Right 1988   Patient Active Problem List   Diagnosis Date Noted   Iron deficiency anemia 01/20/2019   Encounter for long-term (current) use of other medications 02/29/2012   Hypertension    Diabetes mellitus    Hypokalemia    RA (rheumatoid arthritis) (HCC)    EDEMA 08/27/2010   CARPAL TUNNEL SYNDROME, BILATERAL 10/28/2009   NUMBNESS 10/28/2009   Diabetes mellitus without complication (HCC) 09/20/2007   Hyperlipidemia 09/19/2007   Allergic rhinitis 09/19/2007   INSOMNIA 09/19/2007    ONSET DATE: 11/16/2023 referral  REFERRING DIAG: R29.6 (ICD-10-CM) - Falls frequently  THERAPY DIAG:  Muscle weakness (generalized)  Unsteadiness on feet  Other abnormalities of gait and mobility  Abnormal posture  Rationale for Evaluation and Treatment: Rehabilitation  SUBJECTIVE:                                                                                                                                                                                              SUBJECTIVE STATEMENT: Pt reports she is feeling stiff this morning in all her joints from the cold, no pain just soreness and stiffness. Pt denies any falls since last visit.  Pt accompanied by: self  PERTINENT HISTORY: arthritis, DM, HTN, RA  PAIN:  Are you having pain? Yes: NPRS scale: not rated/10 Pain location: whole body Pain description: achy  PRECAUTIONS: Fall  PATIENT GOALS: to not fall  OBJECTIVE:  Note: Objective measures were completed at Evaluation unless otherwise noted.  DIAGNOSTIC FINDINGS: none recent                                                                                                                               TREATMENT: TherAct Reviewed HEP to determine appropriate modifications in order to increase challenge: Normal stance on airex EC x 30 sec, easy Romberg stance on airex EC x 20-30 sec, increased sway Wide stance on airex EC with head nods and rotationsx 30 sec, easy Romberg stance on airex with head nods and rotations x 30 sec, increased sway Wide tandem stance on airex EO x 30 sec, easy Wide tandem stance on airex EC x 30 sec, increased sway  Made appropriate adjustments to her HEP, see bolded below  To work on static standing balance on compliant surface, reaching across midline, and reaching outside BOS: Static stance on airex reaching outside BOS to the R and L to place squigz on mirror and retrieve squigz, close SBA to CGA for balance   PATIENT EDUCATION: Education details: continue HEP/upgraded HEP Person educated: Patient Education method: Explanation, Demonstration, and Handouts Education comprehension: verbalized understanding, returned demonstration, and needs further education  HOME EXERCISE PROGRAM: Access Code: [email or web address] URL:  [email or web address] Date: 12/26/2023 Prepared by: Peter Congo  Exercises - Romberg Stance Eyes Closed on Foam Pad  - 1 x daily - 7 x weekly - 1 sets - 3-5 reps - 30 sec hold - Romberg Stance on Foam Pad with Head Rotation  - 1 x daily - 7 x weekly - 1 sets - 3-5 reps - 30 sec hold - Romberg Stance with Head Nods on Foam Pad  - 1 x daily - 7 x weekly - 1 sets - 3-5 reps - 30 sec hold - Wide Tandem Stance on Foam Pad with Eyes Closed  - 1 x daily - 7 x weekly - 1 sets - 3-5 reps - 30 sec hold   GOALS: Goals reviewed with patient? Yes  SHORT TERM GOALS: Target date: 12/22/23  Pt will be independent with initial HEP for improved balance and functional strength  Baseline: to be provided Goal status: MET  2.  Pt will improve TUG to </= 13 secs to demonstrated reduced fall risk Baseline: 17.19s no AD, 12.47 sec no AD (1/16) Goal status: MET  3.  Patient will score >/= 52/56 on the BBS to demonstrate improved balance Baseline: 43/56, 53/56 (1/16) Goal status: MET  4.  Pt will improve gait speed to >/= .55m/s to demonstrate improved community ambulation Baseline: .84m/s, 0.79 m/s (1/16) Goal status: IN PROGRESS  5.  Pt will improve 5x STS to </= 25 sec to demo improved functional LE strength and balance  Baseline: 32.06s no UE, 22.18 sec hands  on thighs  (1/16) Goal status: MET  6.  MCTSIB to be assessed and LTG added Baseline: to be assessed, assessed 12/31 - see LTG Goal status: MET   LONG TERM GOALS: Target date: 01/19/24  Pt will be independent with final HEP for improved balance and functional strength  Baseline: to be provided Goal status: INITIAL  Pt will improve gait speed to >/= .62m/s to demonstrate improved community ambulation Baseline: .49m/s, 0.79 m/s (1/16) Goal status: INITIAL  Pt will improve 5x STS to </= 20 sec to demo improved functional LE strength and balance  Baseline: 32.06s no UE, 22.18 sec hands on thighs  (1/16) Goal status:  INITIAL  Pt will improve her score on Condition 4 of the mCTSIB to 30 seconds Baseline: 15 sec (12/31) Goal status: INITIAL  ASSESSMENT:  CLINICAL IMPRESSION: Emphasis of skilled PT session on reviewing current HEP and upgrading HEP for increased challenge based on patient performance this date. Remainder of session focused on static balance on compliant surface reaching outside BOS and across midline for targets. Pt reports she tends to lose her balance when turning too fast and continues to benefit from skilled PT services to work on improving her balance reactions and decreasing her risk for falls. Continue POC.   OBJECTIVE IMPAIRMENTS: Abnormal gait, cardiopulmonary status limiting activity, decreased balance, decreased endurance, decreased knowledge of condition, decreased knowledge of use of DME, difficulty walking, decreased strength, and pain.   ACTIVITY LIMITATIONS: carrying, lifting, squatting, stairs, locomotion level, and caring for others  PARTICIPATION LIMITATIONS: meal prep, cleaning, interpersonal relationship, driving, shopping, and community activity  PERSONAL FACTORS: Age, Past/current experiences, Time since onset of injury/illness/exacerbation, and 3+ comorbidities: see above  are also affecting patient's functional outcome.   REHAB POTENTIAL: Good  CLINICAL DECISION MAKING: Stable/uncomplicated  EVALUATION COMPLEXITY: Low  PLAN:  PT FREQUENCY: 2x/week  PT DURATION: 6 weeks  PLANNED INTERVENTIONS: 97164- PT Re-evaluation, 97110-Therapeutic exercises, 97530- Therapeutic activity, [id number]- Neuromuscular re-education, 97535- Self Care, 16109- Manual therapy, [phone]- Gait training, [phone]- Orthotic Fit/training, [phone]- Canalith repositioning, [phone]- Aquatic Therapy, Patient/Family education, Balance training, Stair training, Dry Needling, Vestibular training, Visual/preceptual remediation/compensation, and DME instructions  PLAN FOR NEXT SESSION: Upgrade/Add to HEP PRN,  functional strength, functional balance, stepping strategies, reaching outside BOS and across midline (Blaze pods), rockerboard, turns   Peter Congo, PT Peter Congo, PT, DPT, CSRS  12/26/2023, 10:27 AM

## 2023-12-27 DIAGNOSIS — M069 Rheumatoid arthritis, unspecified: Secondary | ICD-10-CM | POA: Diagnosis not present

## 2023-12-27 DIAGNOSIS — Z6828 Body mass index (BMI) 28.0-28.9, adult: Secondary | ICD-10-CM | POA: Diagnosis not present

## 2023-12-27 DIAGNOSIS — E119 Type 2 diabetes mellitus without complications: Secondary | ICD-10-CM | POA: Diagnosis not present

## 2023-12-27 DIAGNOSIS — J309 Allergic rhinitis, unspecified: Secondary | ICD-10-CM | POA: Diagnosis not present

## 2023-12-27 DIAGNOSIS — M8580 Other specified disorders of bone density and structure, unspecified site: Secondary | ICD-10-CM | POA: Diagnosis not present

## 2023-12-27 DIAGNOSIS — E7800 Pure hypercholesterolemia, unspecified: Secondary | ICD-10-CM | POA: Diagnosis not present

## 2023-12-28 ENCOUNTER — Ambulatory Visit: Payer: Medicare HMO

## 2024-01-02 ENCOUNTER — Ambulatory Visit: Payer: Medicare HMO

## 2024-01-02 DIAGNOSIS — M6281 Muscle weakness (generalized): Secondary | ICD-10-CM | POA: Diagnosis not present

## 2024-01-02 DIAGNOSIS — R293 Abnormal posture: Secondary | ICD-10-CM

## 2024-01-02 DIAGNOSIS — R2689 Other abnormalities of gait and mobility: Secondary | ICD-10-CM

## 2024-01-02 DIAGNOSIS — M62838 Other muscle spasm: Secondary | ICD-10-CM | POA: Diagnosis not present

## 2024-01-02 DIAGNOSIS — R2681 Unsteadiness on feet: Secondary | ICD-10-CM | POA: Diagnosis not present

## 2024-01-02 DIAGNOSIS — M542 Cervicalgia: Secondary | ICD-10-CM | POA: Diagnosis not present

## 2024-01-02 NOTE — Therapy (Signed)
OUTPATIENT PHYSICAL THERAPY NEURO TREATMENT   Patient Name: April [name] MRN: [id number] DOB:04/12/1941, 83 y.o., female Today's Date: 01/02/2024   PCP: Sigmund Hazel, MD REFERRING PROVIDER: Shireen Quan, DO  END OF SESSION:  PT End of Session - 01/02/24 1011     Visit Number 6    Number of Visits 13    Date for PT Re-Evaluation 01/19/24    Authorization Type [employer] medicare    Progress Note Due on Visit 10    PT Start Time 1015    PT Stop Time 1057    PT Time Calculation (min) 42 min    Equipment Utilized During Treatment Gait belt    Activity Tolerance Patient tolerated treatment well    Behavior During Therapy WFL for tasks assessed/performed                Past Medical History:  Diagnosis Date   Allergic rhinitis    Diabetes mellitus    TYPE 2   Dyslipidemia    Hyperkalemia    Hypertension    Insomnia    Pt stated no trouble falling or staying asleep.   RA (rheumatoid arthritis) (HCC)    Past Surgical History:  Procedure Laterality Date   BREAST CYST EXCISION Right 1967   HAND TENDON SURGERY     Right   OVARIAN CYST SURGERY Right 1988   Patient Active Problem List   Diagnosis Date Noted   Iron deficiency anemia 01/20/2019   Encounter for long-term (current) use of other medications 02/29/2012   Hypertension    Diabetes mellitus    Hypokalemia    RA (rheumatoid arthritis) (HCC)    EDEMA 08/27/2010   CARPAL TUNNEL SYNDROME, BILATERAL 10/28/2009   NUMBNESS 10/28/2009   Diabetes mellitus without complication (HCC) 09/20/2007   Hyperlipidemia 09/19/2007   Allergic rhinitis 09/19/2007   INSOMNIA 09/19/2007    ONSET DATE: 11/16/2023 referral  REFERRING DIAG: R29.6 (ICD-10-CM) - Falls frequently  THERAPY DIAG:  Muscle weakness (generalized)  Other abnormalities of gait and mobility  Unsteadiness on feet  Abnormal posture  Rationale for Evaluation and Treatment: Rehabilitation  SUBJECTIVE:                                                                                                                                                                                              SUBJECTIVE STATEMENT: Patient reports doing better- had a lot of pain last week in her joints because of the cold weather. Does still have some soreness today. Denies falls, but did have a slip.   Pt accompanied by: self  PERTINENT HISTORY: arthritis, DM, HTN, RA  PAIN:  Are you having pain? Yes: NPRS scale: 8/10 Pain location: whole body Pain description: achy  PRECAUTIONS: Fall  PATIENT GOALS: to not fall  OBJECTIVE:  Note: Objective measures were completed at Evaluation unless otherwise noted.  DIAGNOSTIC FINDINGS: none recent                                                                                                                               TREATMENT: TherAct -blaze pods random color tap standing on Airex   -3x1 min, R UE, L UE, alternating UE  -step on/off airex lateral step to retrieve squigz and place on mirror   -R/L -NBOS on Airex random color Blaze pod with distracting colors  Therex: -hooklying bridges  -hooklying marches -LTR -nustep level 4 x6 mins B UE/LE  PATIENT EDUCATION: Education details: continue HEP Person educated: Patient Education method: Explanation, Demonstration, and Handouts Education comprehension: verbalized understanding, returned demonstration, and needs further education  HOME EXERCISE PROGRAM: Access Code: [email or web address] URL: [email or web address] Date: 12/26/2023 Prepared by: Peter Congo  Exercises - Romberg Stance Eyes Closed on Foam Pad  - 1 x daily - 7 x weekly - 1 sets - 3-5 reps - 30 sec hold - Romberg Stance on Foam Pad with Head Rotation  - 1 x daily - 7 x weekly - 1 sets - 3-5 reps - 30 sec hold - Romberg Stance with Head Nods on Foam Pad  - 1 x daily - 7 x weekly - 1 sets - 3-5 reps - 30 sec hold - Wide Tandem Stance on Foam Pad with Eyes Closed  - 1 x  daily - 7 x weekly - 1 sets - 3-5 reps - 30 sec hold   GOALS: Goals reviewed with patient? Yes  SHORT TERM GOALS: Target date: 12/22/23  Pt will be independent with initial HEP for improved balance and functional strength  Baseline: to be provided Goal status: MET  2.  Pt will improve TUG to </= 13 secs to demonstrated reduced fall risk Baseline: 17.19s no AD, 12.47 sec no AD (1/16) Goal status: MET  3.  Patient will score >/= 52/56 on the BBS to demonstrate improved balance Baseline: 43/56, 53/56 (1/16) Goal status: MET  4.  Pt will improve gait speed to >/= .45m/s to demonstrate improved community ambulation Baseline: .49m/s, 0.79 m/s (1/16) Goal status: IN PROGRESS  5.  Pt will improve 5x STS to </= 25 sec to demo improved functional LE strength and balance  Baseline: 32.06s no UE, 22.18 sec hands on thighs  (1/16) Goal status: MET  6.  MCTSIB to be assessed and LTG added Baseline: to be assessed, assessed 12/31 - see LTG Goal status: MET   LONG TERM GOALS: Target date: 01/19/24  Pt will be independent with final HEP for improved balance and functional strength  Baseline: to be provided Goal status: INITIAL  Pt will improve gait speed to >/= .61m/s to demonstrate improved community ambulation  Baseline: .64m/s, 0.79 m/s (1/16) Goal status: INITIAL  Pt will improve 5x STS to </= 20 sec to demo improved functional LE strength and balance  Baseline: 32.06s no UE, 22.18 sec hands on thighs  (1/16) Goal status: INITIAL  Pt will improve her score on Condition 4 of the mCTSIB to 30 seconds Baseline: 15 sec (12/31) Goal status: INITIAL  ASSESSMENT:  CLINICAL IMPRESSION: Patient seen for skilled PT session with emphasis on balance retraining and functional mobility. Able to reach outside BOS, but demonstrates questionable safety awareness when given opportunity to step toward target (instead of reaching far outside her BOS), patient would not take a step and it would result  in a LOB. Patient with minimal foot clearance stepping on/off compliant surface resulting in LOB as well. Due to joint pain, patient requesting to complete remainder of session not standing. Continue POC.    OBJECTIVE IMPAIRMENTS: Abnormal gait, cardiopulmonary status limiting activity, decreased balance, decreased endurance, decreased knowledge of condition, decreased knowledge of use of DME, difficulty walking, decreased strength, and pain.   ACTIVITY LIMITATIONS: carrying, lifting, squatting, stairs, locomotion level, and caring for others  PARTICIPATION LIMITATIONS: meal prep, cleaning, interpersonal relationship, driving, shopping, and community activity  PERSONAL FACTORS: Age, Past/current experiences, Time since onset of injury/illness/exacerbation, and 3+ comorbidities: see above  are also affecting patient's functional outcome.   REHAB POTENTIAL: Good  CLINICAL DECISION MAKING: Stable/uncomplicated  EVALUATION COMPLEXITY: Low  PLAN:  PT FREQUENCY: 2x/week  PT DURATION: 6 weeks  PLANNED INTERVENTIONS: 97164- PT Re-evaluation, 97110-Therapeutic exercises, 97530- Therapeutic activity, 97112- Neuromuscular re-education, 97535- Self Care, 95284- Manual therapy, [phone]- Gait training, [phone]- Orthotic Fit/training, [phone]- Canalith repositioning, [phone]- Aquatic Therapy, Patient/Family education, Balance training, Stair training, Dry Needling, Vestibular training, Visual/preceptual remediation/compensation, and DME instructions  PLAN FOR NEXT SESSION: Upgrade/Add to HEP PRN, functional strength, functional balance, stepping strategies, reaching outside BOS and across midline (Blaze pods), rockerboard, turns   Westley Foots, PT Westley Foots, PT, DPT, CBIS  01/02/2024, 11:18 AM

## 2024-01-04 ENCOUNTER — Ambulatory Visit: Payer: Medicare HMO | Admitting: Physical Therapy

## 2024-01-04 DIAGNOSIS — M6281 Muscle weakness (generalized): Secondary | ICD-10-CM

## 2024-01-04 DIAGNOSIS — M542 Cervicalgia: Secondary | ICD-10-CM

## 2024-01-04 DIAGNOSIS — D509 Iron deficiency anemia, unspecified: Secondary | ICD-10-CM | POA: Diagnosis not present

## 2024-01-04 DIAGNOSIS — R293 Abnormal posture: Secondary | ICD-10-CM

## 2024-01-04 DIAGNOSIS — R2689 Other abnormalities of gait and mobility: Secondary | ICD-10-CM | POA: Diagnosis not present

## 2024-01-04 DIAGNOSIS — M62838 Other muscle spasm: Secondary | ICD-10-CM | POA: Diagnosis not present

## 2024-01-04 DIAGNOSIS — R2681 Unsteadiness on feet: Secondary | ICD-10-CM

## 2024-01-04 DIAGNOSIS — D649 Anemia, unspecified: Secondary | ICD-10-CM | POA: Diagnosis not present

## 2024-01-04 NOTE — Therapy (Signed)
OUTPATIENT PHYSICAL THERAPY NEURO TREATMENT   Patient Name: April [name] MRN: [id number] DOB:07-Aug-1941, 83 y.o., female Today's Date: 01/04/2024   PCP: Sigmund Hazel, MD REFERRING PROVIDER: Shireen Quan, DO  END OF SESSION:  PT End of Session - 01/04/24 0920     Visit Number 7    Number of Visits 13    Date for PT Re-Evaluation 01/19/24    Authorization Type [employer] medicare    Progress Note Due on Visit 10    PT Start Time 0920    PT Stop Time 1000    PT Time Calculation (min) 40 min    Equipment Utilized During Treatment Gait belt    Activity Tolerance Patient tolerated treatment well    Behavior During Therapy WFL for tasks assessed/performed                 Past Medical History:  Diagnosis Date   Allergic rhinitis    Diabetes mellitus    TYPE 2   Dyslipidemia    Hyperkalemia    Hypertension    Insomnia    Pt stated no trouble falling or staying asleep.   RA (rheumatoid arthritis) (HCC)    Past Surgical History:  Procedure Laterality Date   BREAST CYST EXCISION Right 1967   HAND TENDON SURGERY     Right   OVARIAN CYST SURGERY Right 1988   Patient Active Problem List   Diagnosis Date Noted   Iron deficiency anemia 01/20/2019   Encounter for long-term (current) use of other medications 02/29/2012   Hypertension    Diabetes mellitus    Hypokalemia    RA (rheumatoid arthritis) (HCC)    EDEMA 08/27/2010   CARPAL TUNNEL SYNDROME, BILATERAL 10/28/2009   NUMBNESS 10/28/2009   Diabetes mellitus without complication (HCC) 09/20/2007   Hyperlipidemia 09/19/2007   Allergic rhinitis 09/19/2007   INSOMNIA 09/19/2007    ONSET DATE: 11/16/2023 referral  REFERRING DIAG: R29.6 (ICD-10-CM) - Falls frequently  THERAPY DIAG:  Muscle weakness (generalized)  Other abnormalities of gait and mobility  Unsteadiness on feet  Abnormal posture  Cervicalgia  Other muscle spasm  Rationale for Evaluation and Treatment: Rehabilitation  SUBJECTIVE:                                                                                                                                                                                              SUBJECTIVE STATEMENT: Pt reports she is doing well, does have ongoing muscle and joint soreness due to the weather and arthritis.  Pt reports that her small dogs continue to run around her and get her tied up with their leashes,  she has disciplined them about this and is aware this is a serious safety risk.  Pt accompanied by: self  PERTINENT HISTORY: arthritis, DM, HTN, RA  PAIN:  Are you having pain? Yes: NPRS scale: 8/10 Pain location: whole body Pain description: achy  PRECAUTIONS: Fall  PATIENT GOALS: to not fall  OBJECTIVE:  Note: Objective measures were completed at Evaluation unless otherwise noted.  DIAGNOSTIC FINDINGS: none recent                                                                                                                               TREATMENT: TherAct 3 Blaze pods on random setting with one anteriorly placed and two laterally placed (R/L) with use of green bungee cord around waist for improved multidirectional weight shift and maintaining balance against perturbations/resistance.  Performed on 1 minute intervals with 30 sec rest periods.  Pt requires CGA to min A guarding. Round 1:   7 hits. Round 2:  11 hits. Round 3:  13 hits. Notable errors/deficits:  occasional stumbles, LOB but able to recover with CGA  Increased pull/resistance with green bungee during 2nd round, pt with increased speed of movement but also has increased number of stumbles. Encouraged her to perform slow, controlled movements and regain her balance before moving on to the next target. Round 1:  21 hits. Round 2:  18 hits. Round 3:  22 hits.  Resisted gait with green bungee to work on dynamic balance and stepping strategy: Forwards gait with steady resistance 2 x 50 ft With posterior  perturbations 2 x 50 ft With multidirectional (posterior, L/R) perturbations 2 x 50 ft, x 2 reps Progression from cross-over stepping and taking small steps to recover balance to taking one step out   PATIENT EDUCATION: Education details: continue HEP, stepping strategy and reasoning behind tasks performed today Person educated: Patient Education method: Explanation Education comprehension: verbalized understanding and needs further education  HOME EXERCISE PROGRAM: Access Code: [email or web address] URL: [email or web address] Date: 12/26/2023 Prepared by: Peter Congo  Exercises - Romberg Stance Eyes Closed on Foam Pad  - 1 x daily - 7 x weekly - 1 sets - 3-5 reps - 30 sec hold - Romberg Stance on Foam Pad with Head Rotation  - 1 x daily - 7 x weekly - 1 sets - 3-5 reps - 30 sec hold - Romberg Stance with Head Nods on Foam Pad  - 1 x daily - 7 x weekly - 1 sets - 3-5 reps - 30 sec hold - Wide Tandem Stance on Foam Pad with Eyes Closed  - 1 x daily - 7 x weekly - 1 sets - 3-5 reps - 30 sec hold   GOALS: Goals reviewed with patient? Yes  SHORT TERM GOALS: Target date: 12/22/23  Pt will be independent with initial HEP for improved balance and functional strength  Baseline: to be provided Goal status: MET  2.  Pt will improve TUG to </= 13  secs to demonstrated reduced fall risk Baseline: 17.19s no AD, 12.47 sec no AD (1/16) Goal status: MET  3.  Patient will score >/= 52/56 on the BBS to demonstrate improved balance Baseline: 43/56, 53/56 (1/16) Goal status: MET  4.  Pt will improve gait speed to >/= .28m/s to demonstrate improved community ambulation Baseline: .55m/s, 0.79 m/s (1/16) Goal status: IN PROGRESS  5.  Pt will improve 5x STS to </= 25 sec to demo improved functional LE strength and balance  Baseline: 32.06s no UE, 22.18 sec hands on thighs  (1/16) Goal status: MET  6.  MCTSIB to be assessed and LTG added Baseline: to be assessed, assessed 12/31 - see  LTG Goal status: MET   LONG TERM GOALS: Target date: 01/19/24  Pt will be independent with final HEP for improved balance and functional strength  Baseline: to be provided Goal status: INITIAL  Pt will improve gait speed to >/= .14m/s to demonstrate improved community ambulation Baseline: .64m/s, 0.79 m/s (1/16) Goal status: INITIAL  Pt will improve 5x STS to </= 20 sec to demo improved functional LE strength and balance  Baseline: 32.06s no UE, 22.18 sec hands on thighs  (1/16) Goal status: INITIAL  Pt will improve her score on Condition 4 of the mCTSIB to 30 seconds Baseline: 15 sec (12/31) Goal status: INITIAL  ASSESSMENT:  CLINICAL IMPRESSION: Emphasis of skilled PT session on working on dynamic balance, stepping strategy, and maintaining balance/recovering balance against resistance and perturbations in order to simulate patient walking her dogs. Pt exhibits an improved ability to performing a stepping out strategy with LOB vs cross over stepping as session progresses. Pt continues to benefit from skilled PT services to continue to work on stepping strategy and improving her balance and decreasing her fall risk. Continue POC.    OBJECTIVE IMPAIRMENTS: Abnormal gait, cardiopulmonary status limiting activity, decreased balance, decreased endurance, decreased knowledge of condition, decreased knowledge of use of DME, difficulty walking, decreased strength, and pain.   ACTIVITY LIMITATIONS: carrying, lifting, squatting, stairs, locomotion level, and caring for others  PARTICIPATION LIMITATIONS: meal prep, cleaning, interpersonal relationship, driving, shopping, and community activity  PERSONAL FACTORS: Age, Past/current experiences, Time since onset of injury/illness/exacerbation, and 3+ comorbidities: see above  are also affecting patient's functional outcome.   REHAB POTENTIAL: Good  CLINICAL DECISION MAKING: Stable/uncomplicated  EVALUATION COMPLEXITY: Low  PLAN:  PT  FREQUENCY: 2x/week  PT DURATION: 6 weeks  PLANNED INTERVENTIONS: 97164- PT Re-evaluation, 97110-Therapeutic exercises, 97530- Therapeutic activity, [id number]- Neuromuscular re-education, 97535- Self Care, 47829- Manual therapy, [phone]- Gait training, [phone]- Orthotic Fit/training, [phone]- Canalith repositioning, [phone]- Aquatic Therapy, Patient/Family education, Balance training, Stair training, Dry Needling, Vestibular training, Visual/preceptual remediation/compensation, and DME instructions  PLAN FOR NEXT SESSION: Upgrade/Add to HEP PRN, functional strength, functional balance, stepping strategies, reaching outside BOS and across midline (Blaze pods), rockerboard, turns, resisted gait   Peter Congo, PT Peter Congo, PT, DPT, CSRS   01/04/2024, 10:03 AM

## 2024-01-09 ENCOUNTER — Ambulatory Visit: Payer: Medicare HMO | Attending: Family Medicine

## 2024-01-09 VITALS — BP 130/66 | HR 67

## 2024-01-09 DIAGNOSIS — R2681 Unsteadiness on feet: Secondary | ICD-10-CM | POA: Diagnosis not present

## 2024-01-09 DIAGNOSIS — R293 Abnormal posture: Secondary | ICD-10-CM | POA: Insufficient documentation

## 2024-01-09 DIAGNOSIS — R2689 Other abnormalities of gait and mobility: Secondary | ICD-10-CM | POA: Insufficient documentation

## 2024-01-09 DIAGNOSIS — M6281 Muscle weakness (generalized): Secondary | ICD-10-CM | POA: Diagnosis not present

## 2024-01-09 NOTE — Therapy (Signed)
 OUTPATIENT PHYSICAL THERAPY NEURO TREATMENT   Patient Name: April [name] MRN: [id number] DOB:1941-06-06, 83 y.o., female Today's Date: 01/09/2024   PCP: Olam Pinal, MD REFERRING PROVIDER: Anastasio Duncans, DO  END OF SESSION:  PT End of Session - 01/09/24 1031     Visit Number 8    Number of Visits 13    Date for PT Re-Evaluation 01/19/24    Authorization Type [employer] medicare    Progress Note Due on Visit 10    PT Start Time 1028   patient late   PT Stop Time 1059    PT Time Calculation (min) 31 min    Activity Tolerance Patient tolerated treatment well    Behavior During Therapy WFL for tasks assessed/performed                 Past Medical History:  Diagnosis Date   Allergic rhinitis    Diabetes mellitus    TYPE 2   Dyslipidemia    Hyperkalemia    Hypertension    Insomnia    Pt stated no trouble falling or staying asleep.   RA (rheumatoid arthritis) (HCC)    Past Surgical History:  Procedure Laterality Date   BREAST CYST EXCISION Right 1967   HAND TENDON SURGERY     Right   OVARIAN CYST SURGERY Right 1988   Patient Active Problem List   Diagnosis Date Noted   Iron  deficiency anemia 01/20/2019   Encounter for long-term (current) use of other medications 02/29/2012   Hypertension    Diabetes mellitus    Hypokalemia    RA (rheumatoid arthritis) (HCC)    EDEMA 08/27/2010   CARPAL TUNNEL SYNDROME, BILATERAL 10/28/2009   NUMBNESS 10/28/2009   Diabetes mellitus without complication (HCC) 09/20/2007   Hyperlipidemia 09/19/2007   Allergic rhinitis 09/19/2007   INSOMNIA 09/19/2007    ONSET DATE: 11/16/2023 referral  REFERRING DIAG: R29.6 (ICD-10-CM) - Falls frequently  THERAPY DIAG:  Muscle weakness (generalized)  Other abnormalities of gait and mobility  Unsteadiness on feet  Abnormal posture  Rationale for Evaluation and Treatment: Rehabilitation  SUBJECTIVE:                                                                                                                                                                                              SUBJECTIVE STATEMENT: Patient reports doing well. Late as she was dropping her brother off at an appt. Denies falls, states she's been more aware of her LOB.  Pt accompanied by: self  PERTINENT HISTORY: arthritis, DM, HTN, RA  PAIN:  Are you having pain? Yes: NPRS scale: 8/10 Pain location: whole  body Pain description: achy  PRECAUTIONS: Fall  PATIENT GOALS: to not fall  OBJECTIVE:  Note: Objective measures were completed at Evaluation unless otherwise noted.  DIAGNOSTIC FINDINGS: none recent                                                                                                                               TREATMENT: TherAct -resisted anterior/posterior gait with bungee blaze pod taps x6 pods  -resisted lateral gait with bungee blaze pod x6 pods  -anterior/posterior rock over airex in // bars  PATIENT EDUCATION: Education details: continue HEP Person educated: Patient Education method: Explanation Education comprehension: verbalized understanding and needs further education  HOME EXERCISE PROGRAM: Access Code: [email or web address] URL: [email or web address] Date: 12/26/2023 Prepared by: Waddell [place]gate  Exercises - Romberg Stance Eyes Closed on Foam Pad  - 1 x daily - 7 x weekly - 1 sets - 3-5 reps - 30 sec hold - Romberg Stance on Foam Pad with Head Rotation  - 1 x daily - 7 x weekly - 1 sets - 3-5 reps - 30 sec hold - Romberg Stance with Head Nods on Foam Pad  - 1 x daily - 7 x weekly - 1 sets - 3-5 reps - 30 sec hold - Wide Tandem Stance on Foam Pad with Eyes Closed  - 1 x daily - 7 x weekly - 1 sets - 3-5 reps - 30 sec hold   GOALS: Goals reviewed with patient? Yes  SHORT TERM GOALS: Target date: 12/22/23  Pt will be independent with initial HEP for improved balance and functional strength  Baseline: to be provided Goal status: MET  2.   Pt will improve TUG to </= 13 secs to demonstrated reduced fall risk Baseline: 17.19s no AD, 12.47 sec no AD (1/16) Goal status: MET  3.  Patient will score >/= 52/56 on the BBS to demonstrate improved balance Baseline: 43/56, 53/56 (1/16) Goal status: MET  4.  Pt will improve gait speed to >/= .64m/s to demonstrate improved community ambulation Baseline: .96m/s, 0.79 m/s (1/16) Goal status: IN PROGRESS  5.  Pt will improve 5x STS to </= 25 sec to demo improved functional LE strength and balance  Baseline: 32.06s no UE, 22.18 sec hands on thighs  (1/16) Goal status: MET  6.  MCTSIB to be assessed and LTG added Baseline: to be assessed, assessed 12/31 - see LTG Goal status: MET   LONG TERM GOALS: Target date: 01/19/24  Pt will be independent with final HEP for improved balance and functional strength  Baseline: to be provided Goal status: INITIAL  Pt will improve gait speed to >/= .36m/s to demonstrate improved community ambulation Baseline: .98m/s, 0.79 m/s (1/16) Goal status: INITIAL  Pt will improve 5x STS to </= 20 sec to demo improved functional LE strength and balance  Baseline: 32.06s no UE, 22.18 sec hands on thighs  (1/16) Goal status: INITIAL  Pt will improve her score on Condition  4 of the mCTSIB to 30 seconds Baseline: 15 sec (12/31) Goal status: INITIAL  ASSESSMENT:  CLINICAL IMPRESSION: Patient seen for skilled PT session with emphasis on balance strategies with perturbations and compliant surfaces. Session limited by patient arriving late. Improving stepping strategy with larger amplitude perturbations with resisted gait. Does tend to cross over her steps increasing her risk for falling with greatest amplitude perturbations. Continue POC.    OBJECTIVE IMPAIRMENTS: Abnormal gait, cardiopulmonary status limiting activity, decreased balance, decreased endurance, decreased knowledge of condition, decreased knowledge of use of DME, difficulty walking, decreased  strength, and pain.   ACTIVITY LIMITATIONS: carrying, lifting, squatting, stairs, locomotion level, and caring for others  PARTICIPATION LIMITATIONS: meal prep, cleaning, interpersonal relationship, driving, shopping, and community activity  PERSONAL FACTORS: Age, Past/current experiences, Time since onset of injury/illness/exacerbation, and 3+ comorbidities: see above  are also affecting patient's functional outcome.   REHAB POTENTIAL: Good  CLINICAL DECISION MAKING: Stable/uncomplicated  EVALUATION COMPLEXITY: Low  PLAN:  PT FREQUENCY: 2x/week  PT DURATION: 6 weeks  PLANNED INTERVENTIONS: 97164- PT Re-evaluation, 97110-Therapeutic exercises, 97530- Therapeutic activity, 97112- Neuromuscular re-education, 97535- Self Care, [id number]- Manual therapy, [phone]- Gait training, [phone]- Orthotic Fit/training, [phone]- Canalith repositioning, [phone]- Aquatic Therapy, Patient/Family education, Balance training, Stair training, Dry Needling, Vestibular training, Visual/preceptual remediation/compensation, and DME instructions  PLAN FOR NEXT SESSION: Upgrade/Add to HEP PRN, functional strength, functional balance, stepping strategies, reaching outside BOS and across midline (Blaze pods), rockerboard, turns, resisted gait   Delon DELENA Pop, PT Delon DELENA Pop, PT, DPT, CBIS  01/09/2024, 11:03 AM

## 2024-01-11 ENCOUNTER — Ambulatory Visit: Payer: Medicare HMO

## 2024-01-11 DIAGNOSIS — R2689 Other abnormalities of gait and mobility: Secondary | ICD-10-CM | POA: Diagnosis not present

## 2024-01-11 DIAGNOSIS — M6281 Muscle weakness (generalized): Secondary | ICD-10-CM | POA: Diagnosis not present

## 2024-01-11 DIAGNOSIS — R2681 Unsteadiness on feet: Secondary | ICD-10-CM | POA: Diagnosis not present

## 2024-01-11 DIAGNOSIS — R293 Abnormal posture: Secondary | ICD-10-CM

## 2024-01-11 NOTE — Therapy (Signed)
 OUTPATIENT PHYSICAL THERAPY NEURO TREATMENT   Patient Name: April [name] MRN: [id number] DOB:06/19/1941, 83 y.o., female Today's Date: 01/11/2024   PCP: Olam Pinal, MD REFERRING PROVIDER: Anastasio Duncans, DO  END OF SESSION:  PT End of Session - 01/11/24 1142     Visit Number 9    Number of Visits 13    Date for PT Re-Evaluation 01/19/24    Authorization Type [employer] medicare    Progress Note Due on Visit 10    PT Start Time 1142    PT Stop Time 1221    PT Time Calculation (min) 39 min    Equipment Utilized During Treatment Gait belt    Activity Tolerance Patient tolerated treatment well    Behavior During Therapy WFL for tasks assessed/performed                 Past Medical History:  Diagnosis Date   Allergic rhinitis    Diabetes mellitus    TYPE 2   Dyslipidemia    Hyperkalemia    Hypertension    Insomnia    Pt stated no trouble falling or staying asleep.   RA (rheumatoid arthritis) (HCC)    Past Surgical History:  Procedure Laterality Date   BREAST CYST EXCISION Right 1967   HAND TENDON SURGERY     Right   OVARIAN CYST SURGERY Right 1988   Patient Active Problem List   Diagnosis Date Noted   Iron  deficiency anemia 01/20/2019   Encounter for long-term (current) use of other medications 02/29/2012   Hypertension    Diabetes mellitus    Hypokalemia    RA (rheumatoid arthritis) (HCC)    EDEMA 08/27/2010   CARPAL TUNNEL SYNDROME, BILATERAL 10/28/2009   NUMBNESS 10/28/2009   Diabetes mellitus without complication (HCC) 09/20/2007   Hyperlipidemia 09/19/2007   Allergic rhinitis 09/19/2007   INSOMNIA 09/19/2007    ONSET DATE: 11/16/2023 referral  REFERRING DIAG: R29.6 (ICD-10-CM) - Falls frequently  THERAPY DIAG:  Muscle weakness (generalized)  Other abnormalities of gait and mobility  Unsteadiness on feet  Abnormal posture  Rationale for Evaluation and Treatment: Rehabilitation  SUBJECTIVE:                                                                                                                                                                                              SUBJECTIVE STATEMENT: Patient reports doing fair. Does have some arthritis-related pain. Does still feel a little dizzy/unbalanced with quick turns. Denies falls.   Pt accompanied by: self  PERTINENT HISTORY: arthritis, DM, HTN, RA  PAIN:  Are you having pain? Yes: NPRS scale: 8/10 Pain  location: whole body Pain description: achy  PRECAUTIONS: Fall  PATIENT GOALS: to not fall  OBJECTIVE:  Note: Objective measures were completed at Evaluation unless otherwise noted.  DIAGNOSTIC FINDINGS: none recent                                                                                                                               TREATMENT: TherAct -scifit hills level 3 x 10 mins B UE/LE for neural priming  -blaze pods random color tap 3x1. turning in a circle  -complex dual tasking following directional sign written in various colors, moving in the direction written, but stating what color it's written in   Self care/ home management: -safety at home in regard to balance/minimizing dizziness/ fall risk   PATIENT EDUCATION: Education details: continue HEP, see above Person educated: Patient Education method: Explanation Education comprehension: verbalized understanding and needs further education  HOME EXERCISE PROGRAM: Access Code: [email or web address] URL: [email or web address] Date: 12/26/2023 Prepared by: Waddell Southgate  Exercises - Romberg Stance Eyes Closed on Foam Pad  - 1 x daily - 7 x weekly - 1 sets - 3-5 reps - 30 sec hold - Romberg Stance on Foam Pad with Head Rotation  - 1 x daily - 7 x weekly - 1 sets - 3-5 reps - 30 sec hold - Romberg Stance with Head Nods on Foam Pad  - 1 x daily - 7 x weekly - 1 sets - 3-5 reps - 30 sec hold - Wide Tandem Stance on Foam Pad with Eyes Closed  - 1 x daily - 7 x weekly - 1  sets - 3-5 reps - 30 sec hold   GOALS: Goals reviewed with patient? Yes  SHORT TERM GOALS: Target date: 12/22/23  Pt will be independent with initial HEP for improved balance and functional strength  Baseline: to be provided Goal status: MET  2.  Pt will improve TUG to </= 13 secs to demonstrated reduced fall risk Baseline: 17.19s no AD, 12.47 sec no AD (1/16) Goal status: MET  3.  Patient will score >/= 52/56 on the BBS to demonstrate improved balance Baseline: 43/56, 53/56 (1/16) Goal status: MET  4.  Pt will improve gait speed to >/= .61m/s to demonstrate improved community ambulation Baseline: .103m/s, 0.79 m/s (1/16) Goal status: IN PROGRESS  5.  Pt will improve 5x STS to </= 25 sec to demo improved functional LE strength and balance  Baseline: 32.06s no UE, 22.18 sec hands on thighs  (1/16) Goal status: MET  6.  MCTSIB to be assessed and LTG added Baseline: to be assessed, assessed 12/31 - see LTG Goal status: MET   LONG TERM GOALS: Target date: 01/19/24  Pt will be independent with final HEP for improved balance and functional strength  Baseline: to be provided Goal status: INITIAL  Pt will improve gait speed to >/= .7m/s to demonstrate improved community ambulation Baseline: .73m/s, 0.79 m/s (1/16) Goal status: INITIAL  Pt  will improve 5x STS to </= 20 sec to demo improved functional LE strength and balance  Baseline: 32.06s no UE, 22.18 sec hands on thighs  (1/16) Goal status: INITIAL  Pt will improve her score on Condition 4 of the mCTSIB to 30 seconds Baseline: 15 sec (12/31) Goal status: INITIAL  ASSESSMENT:  CLINICAL IMPRESSION: Patient seen for skilled PT session with emphasis on progressing balance and dual tasking. Continues to be greatly challenged by complex dual tasking resulting in increased instability, but no overt LOB. She does prioritize cognitive task over motor task. Discussed implications of this with patient. Continue POC.    OBJECTIVE  IMPAIRMENTS: Abnormal gait, cardiopulmonary status limiting activity, decreased balance, decreased endurance, decreased knowledge of condition, decreased knowledge of use of DME, difficulty walking, decreased strength, and pain.   ACTIVITY LIMITATIONS: carrying, lifting, squatting, stairs, locomotion level, and caring for others  PARTICIPATION LIMITATIONS: meal prep, cleaning, interpersonal relationship, driving, shopping, and community activity  PERSONAL FACTORS: Age, Past/current experiences, Time since onset of injury/illness/exacerbation, and 3+ comorbidities: see above  are also affecting patient's functional outcome.   REHAB POTENTIAL: Good  CLINICAL DECISION MAKING: Stable/uncomplicated  EVALUATION COMPLEXITY: Low  PLAN:  PT FREQUENCY: 2x/week  PT DURATION: 6 weeks  PLANNED INTERVENTIONS: 97164- PT Re-evaluation, 97110-Therapeutic exercises, 97530- Therapeutic activity, [id number]- Neuromuscular re-education, 97535- Self Care, 02859- Manual therapy, [id number]- Gait training, [phone]- Orthotic Fit/training, [phone]- Canalith repositioning, [phone]- Aquatic Therapy, Patient/Family education, Balance training, Stair training, Dry Needling, Vestibular training, Visual/preceptual remediation/compensation, and DME instructions  PLAN FOR NEXT SESSION: Upgrade/Add to HEP PRN, functional strength, functional balance, stepping strategies, reaching outside BOS and across midline (Blaze pods), rockerboard, turns, resisted gait, check goals and dc?   Delon DELENA Pop, PT Delon DELENA Pop, PT, DPT, CBIS  01/11/2024, 12:47 PM

## 2024-01-16 ENCOUNTER — Ambulatory Visit: Payer: Medicare HMO | Admitting: Physical Therapy

## 2024-01-16 ENCOUNTER — Encounter: Payer: Self-pay | Admitting: Cardiovascular Disease

## 2024-01-16 NOTE — Therapy (Incomplete)
OUTPATIENT PHYSICAL THERAPY NEURO TREATMENT - DISCHARGE NOTE AND 10TH VISIT PROGRESS NOTE***   Patient Name: April [name] MRN: [id number] DOB:1941/08/30, 83 y.o., female Today's Date: 01/16/2024   PCP: Sigmund Hazel, MD REFERRING PROVIDER: Shireen Quan, DO  PHYSICAL THERAPY DISCHARGE SUMMARY  Visits from Start of Care: ***  Current functional level related to goals / functional outcomes: ***   Remaining deficits: ***   Education / Equipment: ***   Patient agrees to discharge. Patient goals were {OP Goals:25702::"met"}. Patient is being discharged due to {OP Discharge Reasons:25703::"meeting the stated rehab goals."}   Physical Therapy Progress Note   Dates of Reporting Period:*** - ***  See Note below for Objective Data and Assessment of Progress/Goals.  Thank you for the referral of this patient. Peter Congo, PT, DPT, CSRS   END OF SESSION:        Past Medical History:  Diagnosis Date   Allergic rhinitis    Diabetes mellitus    TYPE 2   Dyslipidemia    Hyperkalemia    Hypertension    Insomnia    Pt stated no trouble falling or staying asleep.   RA (rheumatoid arthritis) (HCC)    Past Surgical History:  Procedure Laterality Date   BREAST CYST EXCISION Right 1967   HAND TENDON SURGERY     Right   OVARIAN CYST SURGERY Right 1988   Patient Active Problem List   Diagnosis Date Noted   Iron deficiency anemia 01/20/2019   Encounter for long-term (current) use of other medications 02/29/2012   Hypertension    Diabetes mellitus    Hypokalemia    RA (rheumatoid arthritis) (HCC)    EDEMA 08/27/2010   CARPAL TUNNEL SYNDROME, BILATERAL 10/28/2009   NUMBNESS 10/28/2009   Diabetes mellitus without complication (HCC) 09/20/2007   Hyperlipidemia 09/19/2007   Allergic rhinitis 09/19/2007   INSOMNIA 09/19/2007    ONSET DATE: 11/16/2023 referral  REFERRING DIAG: R29.6 (ICD-10-CM) - Falls frequently  THERAPY DIAG:  No diagnosis  found.  Rationale for Evaluation and Treatment: Rehabilitation  SUBJECTIVE:                                                                                                                                                                                             SUBJECTIVE STATEMENT: ***  Pt accompanied by: self  PERTINENT HISTORY: arthritis, DM, HTN, RA  PAIN:  Are you having pain? Yes: NPRS scale: 8/10 Pain location: whole body Pain description: achy  PRECAUTIONS: Fall  PATIENT GOALS: to not fall  OBJECTIVE:  Note: Objective measures were completed at Evaluation unless otherwise noted.  DIAGNOSTIC FINDINGS: none recent  TREATMENT: TherAct ***    PATIENT EDUCATION: Education details: continue HEP, see above*** Person educated: Patient Education method: Explanation Education comprehension: verbalized understanding and needs further education  HOME EXERCISE PROGRAM: Access Code: [email or web address] URL: [email or web address] Date: 12/26/2023 Prepared by: Peter Congo  Exercises - Romberg Stance Eyes Closed on Foam Pad  - 1 x daily - 7 x weekly - 1 sets - 3-5 reps - 30 sec hold - Romberg Stance on Foam Pad with Head Rotation  - 1 x daily - 7 x weekly - 1 sets - 3-5 reps - 30 sec hold - Romberg Stance with Head Nods on Foam Pad  - 1 x daily - 7 x weekly - 1 sets - 3-5 reps - 30 sec hold - Wide Tandem Stance on Foam Pad with Eyes Closed  - 1 x daily - 7 x weekly - 1 sets - 3-5 reps - 30 sec hold   GOALS: Goals reviewed with patient? Yes  SHORT TERM GOALS: Target date: 12/22/23  Pt will be independent with initial HEP for improved balance and functional strength  Baseline: to be provided Goal status: MET  2.  Pt will improve TUG to </= 13 secs to demonstrated reduced fall risk Baseline: 17.19s no AD, 12.47 sec no AD (1/16) Goal  status: MET  3.  Patient will score >/= 52/56 on the BBS to demonstrate improved balance Baseline: 43/56, 53/56 (1/16) Goal status: MET  4.  Pt will improve gait speed to >/= .60m/s to demonstrate improved community ambulation Baseline: .23m/s, 0.79 m/s (1/16) Goal status: IN PROGRESS  5.  Pt will improve 5x STS to </= 25 sec to demo improved functional LE strength and balance  Baseline: 32.06s no UE, 22.18 sec hands on thighs  (1/16) Goal status: MET  6.  MCTSIB to be assessed and LTG added Baseline: to be assessed, assessed 12/31 - see LTG Goal status: MET   LONG TERM GOALS: Target date: 01/19/24***  Pt will be independent with final HEP for improved balance and functional strength  Baseline: to be provided Goal status: INITIAL  Pt will improve gait speed to >/= .75m/s to demonstrate improved community ambulation Baseline: .64m/s, 0.79 m/s (1/16) Goal status: INITIAL  Pt will improve 5x STS to </= 20 sec to demo improved functional LE strength and balance  Baseline: 32.06s no UE, 22.18 sec hands on thighs  (1/16) Goal status: INITIAL  Pt will improve her score on Condition 4 of the mCTSIB to 30 seconds Baseline: 15 sec (12/31) Goal status: INITIAL  ASSESSMENT:  CLINICAL IMPRESSION: Emphasis of skilled PT session*** Continue POC.    OBJECTIVE IMPAIRMENTS: Abnormal gait, cardiopulmonary status limiting activity, decreased balance, decreased endurance, decreased knowledge of condition, decreased knowledge of use of DME, difficulty walking, decreased strength, and pain.   ACTIVITY LIMITATIONS: carrying, lifting, squatting, stairs, locomotion level, and caring for others  PARTICIPATION LIMITATIONS: meal prep, cleaning, interpersonal relationship, driving, shopping, and community activity  PERSONAL FACTORS: Age, Past/current experiences, Time since onset of injury/illness/exacerbation, and 3+ comorbidities: see above  are also affecting patient's functional outcome.    REHAB POTENTIAL: Good  CLINICAL DECISION MAKING: Stable/uncomplicated  EVALUATION COMPLEXITY: Low  PLAN:  PT FREQUENCY: 2x/week  PT DURATION: 6 weeks  PLANNED INTERVENTIONS: 97164- PT Re-evaluation, 97110-Therapeutic exercises, 97530- Therapeutic activity, 97112- Neuromuscular re-education, 97535- Self Care, 03474- Manual therapy, [phone]- Gait training, [phone]- Orthotic Fit/training, [phone]- Canalith repositioning, [phone]- Aquatic Therapy, Patient/Family education, Balance training, Stair training, Dry Needling, Vestibular training, Visual/preceptual remediation/compensation, and DME instructions  PLAN FOR NEXT SESSION: Upgrade/Add to HEP PRN, functional strength, functional balance, stepping strategies, reaching outside BOS and across midline (Blaze pods), rockerboard, turns, resisted gait, check goals and dc?***   Peter Congo, PT Peter Congo, PT, DPT, CSRS   01/16/2024, 7:37 AM
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
## 2024-01-17 ENCOUNTER — Ambulatory Visit: Payer: Medicare HMO | Attending: Cardiovascular Disease | Admitting: Cardiovascular Disease

## 2024-01-17 ENCOUNTER — Encounter: Payer: Self-pay | Admitting: Cardiovascular Disease

## 2024-01-17 VITALS — BP 118/66 | HR 74 | Ht 64.0 in | Wt 162.4 lb

## 2024-01-17 DIAGNOSIS — I10 Essential (primary) hypertension: Secondary | ICD-10-CM

## 2024-01-17 DIAGNOSIS — E785 Hyperlipidemia, unspecified: Secondary | ICD-10-CM | POA: Diagnosis not present

## 2024-01-17 NOTE — Patient Instructions (Signed)
Lab Work: BMET, Lipids, ALT If you have labs (blood work) drawn today and your tests are completely normal, you will receive your results only by: MyChart Message (if you have MyChart) OR A paper copy in the mail If you have any lab test that is abnormal or we need to change your treatment, we will call you to review the results.  Follow-Up: At [facility], you and your health needs are our priority.  As part of our continuing mission to provide you with exceptional heart care, we have created designated Provider Care Teams.  These Care Teams include your primary Cardiologist (physician) and Advanced Practice Providers (APPs -  Physician Assistants and Nurse Practitioners) who all work together to provide you with the care you need, when you need it.  We recommend signing up for the patient portal called "MyChart".  Sign up information is provided on this After Visit Summary.  MyChart is used to connect with patients for Virtual Visits (Telemedicine).  Patients are able to view lab/test results, encounter notes, upcoming appointments, etc.  Non-urgent messages can be sent to your provider as well.   To learn more about what you can do with MyChart, go to [email or web address].    Your next appointment:   1 year(s)  Provider:   Kristeen Miss, MD

## 2024-01-18 ENCOUNTER — Ambulatory Visit: Payer: Medicare HMO | Admitting: Physical Therapy

## 2024-01-18 VITALS — BP 123/75 | HR 70

## 2024-01-18 DIAGNOSIS — R293 Abnormal posture: Secondary | ICD-10-CM | POA: Diagnosis not present

## 2024-01-18 DIAGNOSIS — R2681 Unsteadiness on feet: Secondary | ICD-10-CM

## 2024-01-18 DIAGNOSIS — M6281 Muscle weakness (generalized): Secondary | ICD-10-CM

## 2024-01-18 DIAGNOSIS — R2689 Other abnormalities of gait and mobility: Secondary | ICD-10-CM | POA: Diagnosis not present

## 2024-01-18 NOTE — Therapy (Signed)
OUTPATIENT PHYSICAL THERAPY NEURO TREATMENT - RECERTIFICATION AND 10TH VISIT PROGRESS NOTE   Patient Name: EVAMAE ROWEN MRN: [id number] DOB:1940/12/14, 83 y.o., female Today's Date: 01/18/2024   PCP: Sigmund Hazel, MD REFERRING PROVIDER: Shireen Quan, DO  Physical Therapy Progress Note   Dates of Reporting Period: 11/24/2023 - 01/18/2024  See Note below for Objective Data and Assessment of Progress/Goals.  Thank you for the referral of this patient. Peter Congo, PT, DPT, CSRS   END OF SESSION:  PT End of Session - 01/18/24 0906     Visit Number 10    Number of Visits 18   recert   Date for PT Re-Evaluation 02/29/24   recert, to allow for scheduling delays   Authorization Type [employer]    Progress Note Due on Visit 10    PT Start Time 0905    PT Stop Time 0940    PT Time Calculation (min) 35 min    Equipment Utilized During Treatment Gait belt    Activity Tolerance Patient tolerated treatment well    Behavior During Therapy WFL for tasks assessed/performed                  Past Medical History:  Diagnosis Date   Allergic rhinitis    Diabetes mellitus    TYPE 2   Dyslipidemia    Hyperkalemia    Hypertension    Insomnia    Pt stated no trouble falling or staying asleep.   RA (rheumatoid arthritis) (HCC)    Past Surgical History:  Procedure Laterality Date   BREAST CYST EXCISION Right 1967   HAND TENDON SURGERY     Right   OVARIAN CYST SURGERY Right 1988   Patient Active Problem List   Diagnosis Date Noted   Iron deficiency anemia 01/20/2019   Encounter for long-term (current) use of other medications 02/29/2012   Hypertension    Diabetes mellitus    Hypokalemia    RA (rheumatoid arthritis) (HCC)    EDEMA 08/27/2010   CARPAL TUNNEL SYNDROME, BILATERAL 10/28/2009   NUMBNESS 10/28/2009   Diabetes mellitus without complication (HCC) 09/20/2007   Hyperlipidemia 09/19/2007   Allergic rhinitis 09/19/2007   INSOMNIA 09/19/2007     ONSET DATE: 11/16/2023 referral  REFERRING DIAG: R29.6 (ICD-10-CM) - Falls frequently  THERAPY DIAG:  Muscle weakness (generalized)  Other abnormalities of gait and mobility  Unsteadiness on feet  Rationale for Evaluation and Treatment: Rehabilitation  SUBJECTIVE:  SUBJECTIVE STATEMENT: Pt reports that she had a fall since last visit, she fell walking from her kitchen to her living room. Pt reports she is not sure what happened but she fell backwards and hit the back of her head on her rocking chair (this was Tuesday 2/11). Pt reports that felt a little woozy and sore but she did not seek medical attention. Pt reports that she had to sit a while to get her bearings before getting back up, was too embarrassed to ask for help so got back up herself.  When pt asked about falling walking her dogs which is the reason she rescheduled her PT appointment she reports that she also had a fall on Monday walking her dogs, she slipped on the grass and had a softer landing with this. Pt was trying to walk both dogs at the time, has decided that she will walk one and her brother will walk the other.  Pt reports that she saw her cardiologist yesterday, told them about her falls - he suggested it is related to her "balance structure".  Pt denies any pain today initially then reports her hands are very sore and tender, likely related to weather (rainy, cold) and arthritis.  Pt accompanied by: self  PERTINENT HISTORY: arthritis, DM, HTN, RA  PAIN:  Are you having pain? Yes: NPRS scale: 8/10 Pain location: whole body Pain description: achy  PRECAUTIONS: Fall  PATIENT GOALS: to not fall  OBJECTIVE:  Note: Objective measures were completed at Evaluation unless otherwise noted.  DIAGNOSTIC FINDINGS: none  recent                                                                                                                               TREATMENT:  Vitals:   01/18/24 0915  BP: 123/75  Pulse: 70    TherAct For LTG assessment:  [facility] PT Assessment - 01/18/24 0920       Ambulation/Gait   Gait velocity - backwards 32.8 ft over 14.56 sec = 2.25 ft/sec      Standardized Balance Assessment   Standardized Balance Assessment Five Times Sit to Stand    Five times sit to stand comments  32 sec   hands on thighs           mCTSIB Condition 4: 19 sec avg score over 3 trials  Educated patient that following a fall, especially one in which she hits her head, she needs to seek medical attention to assess for internal injuries. Also encouraged patient to let her brother or her husband know if she falls so that they can assist her. Pt is embarrassed by her frequent falls but understanding of importance of seeking medical attention as well as help from her family. Pt has agreed that walking both of her dogs at the same time is not safe for her to continue. Discussed PT POC and results of OM this date, recommend continued PT due to scores and  continued frequency of falls.  PATIENT EDUCATION: Education details: continue HEP, see above Person educated: Patient Education method: Explanation Education comprehension: verbalized understanding and needs further education  HOME EXERCISE PROGRAM: Access Code: [email or web address] URL: [email or web address] Date: 12/26/2023 Prepared by: Peter Congo  Exercises - Romberg Stance Eyes Closed on Foam Pad  - 1 x daily - 7 x weekly - 1 sets - 3-5 reps - 30 sec hold - Romberg Stance on Foam Pad with Head Rotation  - 1 x daily - 7 x weekly - 1 sets - 3-5 reps - 30 sec hold - Romberg Stance with Head Nods on Foam Pad  - 1 x daily - 7 x weekly - 1 sets - 3-5 reps - 30 sec hold - Wide Tandem Stance on Foam Pad with Eyes Closed  - 1 x daily - 7 x weekly - 1  sets - 3-5 reps - 30 sec hold   GOALS: Goals reviewed with patient? Yes  SHORT TERM GOALS: Target date: 12/22/23  Pt will be independent with initial HEP for improved balance and functional strength  Baseline: to be provided Goal status: MET  2.  Pt will improve TUG to </= 13 secs to demonstrated reduced fall risk Baseline: 17.19s no AD, 12.47 sec no AD (1/16) Goal status: MET  3.  Patient will score >/= 52/56 on the BBS to demonstrate improved balance Baseline: 43/56, 53/56 (1/16) Goal status: MET  4.  Pt will improve gait speed to >/= .73m/s to demonstrate improved community ambulation Baseline: .16m/s, 0.79 m/s (1/16) Goal status: IN PROGRESS  5.  Pt will improve 5x STS to </= 25 sec to demo improved functional LE strength and balance  Baseline: 32.06s no UE, 22.18 sec hands on thighs  (1/16) Goal status: MET  6.  MCTSIB to be assessed and LTG added Baseline: to be assessed, assessed 12/31 - see LTG Goal status: MET   LONG TERM GOALS: Target date: 01/19/24  Pt will be independent with final HEP for improved balance and functional strength  Baseline: to be provided Goal status: IN PROGRESS  Pt will improve gait speed to >/= .50m/s to demonstrate improved community ambulation Baseline: .48m/s, 0.79 m/s (1/16), 0.68 m/s (2/13) Goal status: NOT MET  Pt will improve 5x STS to </= 20 sec to demo improved functional LE strength and balance  Baseline: 32.06s no UE, 22.18 sec hands on thighs  (1/16), 32 sec hands on thighs (2/13) Goal status: IN PROGRESS  Pt will improve her score on Condition 4 of the mCTSIB to 30 seconds Baseline: 15 sec (12/31), 19 sec (2/13) Goal status: IN PROGRESS  NEW SHORT TERM GOALS=LONG TERM GOALS due to length of POC   NEW LONG TERM GOALS:  Target date: 02/16/2024  1.  FGA to be assessed and LTG set Baseline: not yet assessed Goal status: INITIAL  2. Pt will be independent with final HEP for improved balance and functional strength   Baseline: to be provided Goal status: IN PROGRESS  3. Pt will improve gait speed to >/= .34m/s to demonstrate improved community ambulation Baseline: .72m/s, 0.79 m/s (1/16), 0.68 m/s (2/13) Goal status: NOT MET  4. Pt will improve 5x STS to </= 20 sec to demo improved functional LE strength and balance  Baseline: 32.06s no UE, 22.18 sec hands on thighs  (1/16), 32 sec hands on thighs (2/13) Goal status: IN PROGRESS  Pt will improve her score on Condition 4 of the mCTSIB to 30 seconds Baseline: 15 sec (12/31),  19 sec (2/13) Goal status: IN PROGRESS   ASSESSMENT:  CLINICAL IMPRESSION: Emphasis of skilled PT session on assessing LTG and creating new LTG for recertification of PT services this date. Pt has met 0/4 LTG assessed this date though she is making progress towards 3/4 LTG. She exhibits no change in her 5xSTS score from initial assessment and a worse score than last assessment, decreased gait speed as compared to initial and previous assessment, and slightly improved balance on Condition 4 of the mCTSIB as compared to initial assessment. Patient has also experienced two falls since last PT visit and remains a very high fall risk and at risk for serious injury. Patient continues to benefit from skilled PT services to work on improving her balance and decreasing her risk for falls. Continue POC.    OBJECTIVE IMPAIRMENTS: Abnormal gait, cardiopulmonary status limiting activity, decreased balance, decreased endurance, decreased knowledge of condition, decreased knowledge of use of DME, difficulty walking, decreased strength, and pain.   ACTIVITY LIMITATIONS: carrying, lifting, squatting, stairs, locomotion level, and caring for others  PARTICIPATION LIMITATIONS: meal prep, cleaning, interpersonal relationship, driving, shopping, and community activity  PERSONAL FACTORS: Age, Past/current experiences, Time since onset of injury/illness/exacerbation, and 3+ comorbidities: see above  are  also affecting patient's functional outcome.   REHAB POTENTIAL: Good  CLINICAL DECISION MAKING: Stable/uncomplicated  EVALUATION COMPLEXITY: Low  PLAN:  PT FREQUENCY: 2x/week  PT DURATION: 6 weeks+4 weeks (recert)  PLANNED INTERVENTIONS: 16109- PT Re-evaluation, 97110-Therapeutic exercises, 97530- Therapeutic activity, [id number]- Neuromuscular re-education, 97535- Self Care, 60454- Manual therapy, [phone]- Gait training, [phone]- Orthotic Fit/training, [phone]- Canalith repositioning, [phone]- Aquatic Therapy, Patient/Family education, Balance training, Stair training, Dry Needling, Vestibular training, Visual/preceptual remediation/compensation, and DME instructions  PLAN FOR NEXT SESSION: assess FGA and write LTG, Upgrade/Add to HEP PRN, functional strength, functional balance, stepping strategies, reaching outside BOS and across midline (Blaze pods), rockerboard, turns, resisted gait   Peter Congo, PT Peter Congo, PT, DPT, CSRS   01/18/2024, 10:02 AM

## 2024-01-22 ENCOUNTER — Other Ambulatory Visit: Payer: Self-pay

## 2024-01-22 MED ORDER — SPIRONOLACTONE 100 MG PO TABS: 100.0000 mg | ORAL_TABLET | Freq: Every day | ORAL | 3 refills | Status: DC

## 2024-01-23 ENCOUNTER — Ambulatory Visit: Payer: Medicare HMO

## 2024-01-23 VITALS — BP 134/68 | HR 70

## 2024-01-23 DIAGNOSIS — M6281 Muscle weakness (generalized): Secondary | ICD-10-CM

## 2024-01-23 DIAGNOSIS — R2689 Other abnormalities of gait and mobility: Secondary | ICD-10-CM

## 2024-01-23 DIAGNOSIS — R2681 Unsteadiness on feet: Secondary | ICD-10-CM

## 2024-01-23 DIAGNOSIS — R293 Abnormal posture: Secondary | ICD-10-CM

## 2024-01-23 NOTE — Therapy (Signed)
OUTPATIENT PHYSICAL THERAPY NEURO TREATMENT    Patient Name: EMILIYA CHRETIEN MRN: [id number] DOB:1941/06/12, 83 y.o., female Today's Date: 01/23/2024   PCP: Sigmund Hazel, MD REFERRING PROVIDER: Shireen Quan, DO   END OF SESSION:  PT End of Session - 01/23/24 1442     Visit Number 11    Number of Visits 18    Date for PT Re-Evaluation 02/29/24    Authorization Type [employer]    Progress Note Due on Visit 20    PT Start Time 1441    PT Stop Time 1524    PT Time Calculation (min) 43 min    Equipment Utilized During Treatment Gait belt    Activity Tolerance Patient tolerated treatment well    Behavior During Therapy WFL for tasks assessed/performed                  Past Medical History:  Diagnosis Date   Allergic rhinitis    Diabetes mellitus    TYPE 2   Dyslipidemia    Hyperkalemia    Hypertension    Insomnia    Pt stated no trouble falling or staying asleep.   RA (rheumatoid arthritis) (HCC)    Past Surgical History:  Procedure Laterality Date   BREAST CYST EXCISION Right 1967   HAND TENDON SURGERY     Right   OVARIAN CYST SURGERY Right 1988   Patient Active Problem List   Diagnosis Date Noted   Iron deficiency anemia 01/20/2019   Encounter for long-term (current) use of other medications 02/29/2012   Hypertension    Diabetes mellitus    Hypokalemia    RA (rheumatoid arthritis) (HCC)    EDEMA 08/27/2010   CARPAL TUNNEL SYNDROME, BILATERAL 10/28/2009   NUMBNESS 10/28/2009   Diabetes mellitus without complication (HCC) 09/20/2007   Hyperlipidemia 09/19/2007   Allergic rhinitis 09/19/2007   INSOMNIA 09/19/2007    ONSET DATE: 11/16/2023 referral  REFERRING DIAG: R29.6 (ICD-10-CM) - Falls frequently  THERAPY DIAG:  Muscle weakness (generalized)  Other abnormalities of gait and mobility  Unsteadiness on feet  Abnormal posture  Rationale for Evaluation and Treatment: Rehabilitation  SUBJECTIVE:                                                                                                                                                                                              SUBJECTIVE STATEMENT: Patient arrives to clinic alone, no AD. Has not had any more falls, but did have a "slip" in her kitchen where she turned too fast and had to hold onto the counters.   Pt accompanied by: self  PERTINENT HISTORY:  arthritis, DM, HTN, RA  PAIN:  Are you having pain? Yes: NPRS scale: 8/10 Pain location: whole body Pain description: achy  PRECAUTIONS: Fall  PATIENT GOALS: to not fall  OBJECTIVE:  Note: Objective measures were completed at Evaluation unless otherwise noted.  DIAGNOSTIC FINDINGS: none recent                                                                                                                               TREATMENT:  Vitals:   01/23/24 1446  BP: 134/68  Pulse: 70     Physical performance:  [facility] PT Assessment - 01/23/24 0001       Functional Gait  Assessment   Gait assessed  Yes    Gait Level Surface Walks 20 ft, slow speed, abnormal gait pattern, evidence for imbalance or deviates 10-15 in outside of the 12 in walkway width. Requires more than 7 sec to ambulate 20 ft.    Change in Gait Speed Able to change speed, demonstrates mild gait deviations, deviates 6-10 in outside of the 12 in walkway width, or no gait deviations, unable to achieve a major change in velocity, or uses a change in velocity, or uses an assistive device.    Gait with Horizontal Head Turns Performs head turns smoothly with slight change in gait velocity (eg, minor disruption to smooth gait path), deviates 6-10 in outside 12 in walkway width, or uses an assistive device.    Gait with Vertical Head Turns Performs task with slight change in gait velocity (eg, minor disruption to smooth gait path), deviates 6 - 10 in outside 12 in walkway width or uses assistive device    Gait and Pivot Turn Pivot turns safely in  greater than 3 sec and stops with no loss of balance, or pivot turns safely within 3 sec and stops with mild imbalance, requires small steps to catch balance.    Step Over Obstacle Is able to step over one shoe box (4.5 in total height) but must slow down and adjust steps to clear box safely. May require verbal cueing.    Gait with Narrow Base of Support Ambulates 7-9 steps.    Gait with Eyes Closed Walks 20 ft, slow speed, abnormal gait pattern, evidence for imbalance, deviates 10-15 in outside 12 in walkway width. Requires more than 9 sec to ambulate 20 ft.    Ambulating Backwards Walks 20 ft, slow speed, abnormal gait pattern, evidence for imbalance, deviates 10-15 in outside 12 in walkway width.    Steps Alternating feet, must use rail.    Total Score 16            Theract: -standing on airex in corner [place] Chart horizontal -> diagonal  -lateral stepping on foam balance beam  -tandem gait on foam balance beam -anterior step over 4" hurdle from compliant surface  PATIENT EDUCATION: Education details: continue HEP Person educated: Patient Education method: Explanation Education comprehension: verbalized understanding and needs  further education  HOME EXERCISE PROGRAM: Access Code: [email or web address] URL: [email or web address] Date: 12/26/2023 Prepared by: Peter Congo  Exercises - Romberg Stance Eyes Closed on Foam Pad  - 1 x daily - 7 x weekly - 1 sets - 3-5 reps - 30 sec hold - Romberg Stance on Foam Pad with Head Rotation  - 1 x daily - 7 x weekly - 1 sets - 3-5 reps - 30 sec hold - Romberg Stance with Head Nods on Foam Pad  - 1 x daily - 7 x weekly - 1 sets - 3-5 reps - 30 sec hold - Wide Tandem Stance on Foam Pad with Eyes Closed  - 1 x daily - 7 x weekly - 1 sets - 3-5 reps - 30 sec hold - Tandem Walking with Counter Support  - 1 x daily - 7 x weekly - 3 sets - 10 reps   GOALS: Goals reviewed with patient? Yes  SHORT TERM GOALS: Target date: 12/22/23  Pt will  be independent with initial HEP for improved balance and functional strength  Baseline: to be provided Goal status: MET  2.  Pt will improve TUG to </= 13 secs to demonstrated reduced fall risk Baseline: 17.19s no AD, 12.47 sec no AD (1/16) Goal status: MET  3.  Patient will score >/= 52/56 on the BBS to demonstrate improved balance Baseline: 43/56, 53/56 (1/16) Goal status: MET  4.  Pt will improve gait speed to >/= .60m/s to demonstrate improved community ambulation Baseline: .73m/s, 0.79 m/s (1/16) Goal status: IN PROGRESS  5.  Pt will improve 5x STS to </= 25 sec to demo improved functional LE strength and balance  Baseline: 32.06s no UE, 22.18 sec hands on thighs  (1/16) Goal status: MET  6.  MCTSIB to be assessed and LTG added Baseline: to be assessed, assessed 12/31 - see LTG Goal status: MET   LONG TERM GOALS: Target date: 01/19/24  Pt will be independent with final HEP for improved balance and functional strength  Baseline: to be provided Goal status: IN PROGRESS  Pt will improve gait speed to >/= .45m/s to demonstrate improved community ambulation Baseline: .76m/s, 0.79 m/s (1/16), 0.68 m/s (2/13) Goal status: NOT MET  Pt will improve 5x STS to </= 20 sec to demo improved functional LE strength and balance  Baseline: 32.06s no UE, 22.18 sec hands on thighs  (1/16), 32 sec hands on thighs (2/13) Goal status: IN PROGRESS  Pt will improve her score on Condition 4 of the mCTSIB to 30 seconds Baseline: 15 sec (12/31), 19 sec (2/13) Goal status: IN PROGRESS  NEW SHORT TERM GOALS=LONG TERM GOALS due to length of POC   NEW LONG TERM GOALS:  Target date: 02/16/2024  1.  Pt will improve FGA to >/= 16/30 to demonstrate improved balance and reduced fall risk  Baseline: 16/30 Goal status: REVISED  2. Pt will be independent with final HEP for improved balance and functional strength  Baseline: to be provided Goal status: IN PROGRESS  3. Pt will improve gait speed to  >/= .50m/s to demonstrate improved community ambulation Baseline: .39m/s, 0.79 m/s (1/16), 0.68 m/s (2/13) Goal status: NOT MET  4. Pt will improve 5x STS to </= 20 sec to demo improved functional LE strength and balance  Baseline: 32.06s no UE, 22.18 sec hands on thighs  (1/16), 32 sec hands on thighs (2/13) Goal status: IN PROGRESS  Pt will improve her score on Condition 4 of the mCTSIB to 30 seconds Baseline:  15 sec (12/31), 19 sec (2/13) Goal status: IN PROGRESS   ASSESSMENT:  CLINICAL IMPRESSION: Patient seen for skilled PT session with emphasis on outcome measure assessment and progressive balance retraining. Patient demonstrates increased fall risk as noted by score of 16/30 on  Functional Gait Assessment.   <22/30 = predictive of falls, <20/30 = fall in 6 months, <18/30 = predictive of falls in PD MCID: 5 points stroke population, 4 points geriatric population (ANPTA Core Set of Outcome Measures for Adults with Neurologic Conditions, 2018). Patient with increased instability with rapid head movements, primarily on a diagonal. Appropriate use of balance strategies observed, but continues with limited ability to dual task. Continue POC.     OBJECTIVE IMPAIRMENTS: Abnormal gait, cardiopulmonary status limiting activity, decreased balance, decreased endurance, decreased knowledge of condition, decreased knowledge of use of DME, difficulty walking, decreased strength, and pain.   ACTIVITY LIMITATIONS: carrying, lifting, squatting, stairs, locomotion level, and caring for others  PARTICIPATION LIMITATIONS: meal prep, cleaning, interpersonal relationship, driving, shopping, and community activity  PERSONAL FACTORS: Age, Past/current experiences, Time since onset of injury/illness/exacerbation, and 3+ comorbidities: see above  are also affecting patient's functional outcome.   REHAB POTENTIAL: Good  CLINICAL DECISION MAKING: Stable/uncomplicated  EVALUATION COMPLEXITY:  Low  PLAN:  PT FREQUENCY: 2x/week  PT DURATION: 6 weeks+4 weeks (recert)  PLANNED INTERVENTIONS: 16109- PT Re-evaluation, 97110-Therapeutic exercises, 97530- Therapeutic activity, [id number]- Neuromuscular re-education, 97535- Self Care, 60454- Manual therapy, [phone]- Gait training, [phone]- Orthotic Fit/training, [phone]- Canalith repositioning, [phone]- Aquatic Therapy, Patient/Family education, Balance training, Stair training, Dry Needling, Vestibular training, Visual/preceptual remediation/compensation, and DME instructions  PLAN FOR NEXT SESSION: Upgrade/Add to HEP PRN, functional strength, functional balance, stepping strategies, reaching outside BOS and across midline (Blaze pods), rockerboard, turns, resisted gait, dual tasking   Westley Foots, PT Westley Foots, PT, DPT, CBIS  01/23/2024, 3:35 PM

## 2024-01-25 ENCOUNTER — Ambulatory Visit: Payer: Medicare HMO | Admitting: Physical Therapy

## 2024-01-25 NOTE — Therapy (Incomplete)
OUTPATIENT PHYSICAL THERAPY NEURO TREATMENT    Patient Name: JANNICE BEITZEL MRN: [id number] DOB:[birth year], 83 y.o., female Today's Date: 01/25/2024   PCP: Sigmund Hazel, MD REFERRING PROVIDER: Shireen Quan, DO   END OF SESSION:         Past Medical History:  Diagnosis Date   Allergic rhinitis    Diabetes mellitus    TYPE 2   Dyslipidemia    Hyperkalemia    Hypertension    Insomnia    Pt stated no trouble falling or staying asleep.   RA (rheumatoid arthritis) (HCC)    Past Surgical History:  Procedure Laterality Date   BREAST CYST EXCISION Right 1967   HAND TENDON SURGERY     Right   OVARIAN CYST SURGERY Right 1988   Patient Active Problem List   Diagnosis Date Noted   Iron deficiency anemia 01/20/2019   Encounter for long-term (current) use of other medications 02/29/2012   Hypertension    Diabetes mellitus    Hypokalemia    RA (rheumatoid arthritis) (HCC)    EDEMA 08/27/2010   CARPAL TUNNEL SYNDROME, BILATERAL 10/28/2009   NUMBNESS 10/28/2009   Diabetes mellitus without complication (HCC) 09/20/2007   Hyperlipidemia 09/19/2007   Allergic rhinitis 09/19/2007   INSOMNIA 09/19/2007    ONSET DATE: 11/16/2023 referral  REFERRING DIAG: R29.6 (ICD-10-CM) - Falls frequently  THERAPY DIAG:  No diagnosis found.  Rationale for Evaluation and Treatment: Rehabilitation  SUBJECTIVE:                                                                                                                                                                                             SUBJECTIVE STATEMENT: ***  Pt accompanied by: self  PERTINENT HISTORY: arthritis, DM, HTN, RA  PAIN:  Are you having pain? Yes: NPRS scale: 8/10 Pain location: whole body Pain description: achy  PRECAUTIONS: Fall  PATIENT GOALS: to not fall  OBJECTIVE:  Note: Objective measures were completed at Evaluation unless otherwise noted.  DIAGNOSTIC FINDINGS: none recent  TREATMENT:  There were no vitals filed for this visit.    TherAct ***  PATIENT EDUCATION: Education details: continue HEP*** Person educated: Patient Education method: Explanation Education comprehension: verbalized understanding and needs further education  HOME EXERCISE PROGRAM: Access Code: [email or web address] URL: [email or web address] Date: 12/26/2023 Prepared by: Peter Congo  Exercises - Romberg Stance Eyes Closed on Foam Pad  - 1 x daily - 7 x weekly - 1 sets - 3-5 reps - 30 sec hold - Romberg Stance on Foam Pad with Head Rotation  - 1 x daily - 7 x weekly - 1 sets - 3-5 reps - 30 sec hold - Romberg Stance with Head Nods on Foam Pad  - 1 x daily - 7 x weekly - 1 sets - 3-5 reps - 30 sec hold - Wide Tandem Stance on Foam Pad with Eyes Closed  - 1 x daily - 7 x weekly - 1 sets - 3-5 reps - 30 sec hold - Tandem Walking with Counter Support  - 1 x daily - 7 x weekly - 3 sets - 10 reps   GOALS: Goals reviewed with patient? Yes  SHORT TERM GOALS: Target date: 12/22/23  Pt will be independent with initial HEP for improved balance and functional strength  Baseline: to be provided Goal status: MET  2.  Pt will improve TUG to </= 13 secs to demonstrated reduced fall risk Baseline: 17.19s no AD, 12.47 sec no AD (1/16) Goal status: MET  3.  Patient will score >/= 52/56 on the BBS to demonstrate improved balance Baseline: 43/56, 53/56 (1/16) Goal status: MET  4.  Pt will improve gait speed to >/= .43m/s to demonstrate improved community ambulation Baseline: .60m/s, 0.79 m/s (1/16) Goal status: IN PROGRESS  5.  Pt will improve 5x STS to </= 25 sec to demo improved functional LE strength and balance  Baseline: 32.06s no UE, 22.18 sec hands on thighs  (1/16) Goal status: MET  6.  MCTSIB to be assessed and LTG added Baseline: to be assessed, assessed  12/31 - see LTG Goal status: MET   LONG TERM GOALS: Target date: 01/19/24  Pt will be independent with final HEP for improved balance and functional strength  Baseline: to be provided Goal status: IN PROGRESS  Pt will improve gait speed to >/= .80m/s to demonstrate improved community ambulation Baseline: .56m/s, 0.79 m/s (1/16), 0.68 m/s (2/13) Goal status: NOT MET  Pt will improve 5x STS to </= 20 sec to demo improved functional LE strength and balance  Baseline: 32.06s no UE, 22.18 sec hands on thighs  (1/16), 32 sec hands on thighs (2/13) Goal status: IN PROGRESS  Pt will improve her score on Condition 4 of the mCTSIB to 30 seconds Baseline: 15 sec (12/31), 19 sec (2/13) Goal status: IN PROGRESS  NEW SHORT TERM GOALS=LONG TERM GOALS due to length of POC   NEW LONG TERM GOALS:  Target date: 02/16/2024  1.  Pt will improve FGA to >/= 16/30 to demonstrate improved balance and reduced fall risk  Baseline: 16/30 Goal status: REVISED  2. Pt will be independent with final HEP for improved balance and functional strength  Baseline: to be provided Goal status: IN PROGRESS  3. Pt will improve gait speed to >/= .38m/s to demonstrate improved community ambulation Baseline: .15m/s, 0.79 m/s (1/16), 0.68 m/s (2/13) Goal status: NOT MET  4. Pt will improve 5x STS to </= 20 sec to demo improved functional LE strength and balance  Baseline: 32.06s no UE,  22.18 sec hands on thighs  (1/16), 32 sec hands on thighs (2/13) Goal status: IN PROGRESS  Pt will improve her score on Condition 4 of the mCTSIB to 30 seconds Baseline: 15 sec (12/31), 19 sec (2/13) Goal status: IN PROGRESS   ASSESSMENT:  CLINICAL IMPRESSION: Emphasis of skilled PT session*** Continue POC.     OBJECTIVE IMPAIRMENTS: Abnormal gait, cardiopulmonary status limiting activity, decreased balance, decreased endurance, decreased knowledge of condition, decreased knowledge of use of DME, difficulty walking, decreased  strength, and pain.   ACTIVITY LIMITATIONS: carrying, lifting, squatting, stairs, locomotion level, and caring for others  PARTICIPATION LIMITATIONS: meal prep, cleaning, interpersonal relationship, driving, shopping, and community activity  PERSONAL FACTORS: Age, Past/current experiences, Time since onset of injury/illness/exacerbation, and 3+ comorbidities: see above  are also affecting patient's functional outcome.   REHAB POTENTIAL: Good  CLINICAL DECISION MAKING: Stable/uncomplicated  EVALUATION COMPLEXITY: Low  PLAN:  PT FREQUENCY: 2x/week  PT DURATION: 6 weeks+4 weeks (recert)  PLANNED INTERVENTIONS: 91478- PT Re-evaluation, 97110-Therapeutic exercises, 97530- Therapeutic activity, [id number]- Neuromuscular re-education, 97535- Self Care, 29562- Manual therapy, [phone]- Gait training, [phone]- Orthotic Fit/training, [phone]- Canalith repositioning, [phone]- Aquatic Therapy, Patient/Family education, Balance training, Stair training, Dry Needling, Vestibular training, Visual/preceptual remediation/compensation, and DME instructions  PLAN FOR NEXT SESSION: Upgrade/Add to HEP PRN, functional strength, functional balance, stepping strategies, reaching outside BOS and across midline (Blaze pods), rockerboard, turns, resisted gait, dual tasking***   Peter Congo, PT Peter Congo, PT, DPT, CSRS   01/25/2024, 7:33 AM

## 2024-01-29 ENCOUNTER — Other Ambulatory Visit: Payer: Self-pay

## 2024-01-29 DIAGNOSIS — E876 Hypokalemia: Secondary | ICD-10-CM

## 2024-01-29 DIAGNOSIS — E119 Type 2 diabetes mellitus without complications: Secondary | ICD-10-CM

## 2024-01-29 DIAGNOSIS — E7800 Pure hypercholesterolemia, unspecified: Secondary | ICD-10-CM

## 2024-01-29 MED ORDER — ACCU-CHEK SOFTCLIX LANCETS MISC: 2 refills | Status: DC

## 2024-01-29 MED ORDER — POTASSIUM CHLORIDE CRYS ER 20 MEQ PO TBCR: EXTENDED_RELEASE_TABLET | ORAL | 1 refills | Status: DC

## 2024-01-29 MED ORDER — PRAVASTATIN SODIUM 80 MG PO TABS: 80.0000 mg | ORAL_TABLET | Freq: Every day | ORAL | 1 refills | Status: DC

## 2024-01-29 MED ORDER — METFORMIN HCL ER 500 MG PO TB24: ORAL_TABLET | ORAL | 2 refills | Status: DC

## 2024-01-29 MED ORDER — ACCU-CHEK GUIDE TEST VI STRP
ORAL_STRIP | 12 refills | Status: AC
Start: 2024-01-29 — End: ?

## 2024-01-30 ENCOUNTER — Ambulatory Visit: Payer: Medicare HMO | Admitting: Physical Therapy

## 2024-01-30 VITALS — BP 165/72 | HR 89

## 2024-01-30 DIAGNOSIS — M6281 Muscle weakness (generalized): Secondary | ICD-10-CM

## 2024-01-30 DIAGNOSIS — R2689 Other abnormalities of gait and mobility: Secondary | ICD-10-CM

## 2024-01-30 DIAGNOSIS — R2681 Unsteadiness on feet: Secondary | ICD-10-CM

## 2024-01-30 DIAGNOSIS — R293 Abnormal posture: Secondary | ICD-10-CM | POA: Diagnosis not present

## 2024-01-30 NOTE — Therapy (Signed)
 OUTPATIENT PHYSICAL THERAPY NEURO TREATMENT    Patient Name: April [name] MRN: [id number] DOB:1941-12-05, 83 y.o., female Today's Date: 01/30/2024   PCP: Sigmund Hazel, MD REFERRING PROVIDER: Shireen Quan, DO   END OF SESSION:  PT End of Session - 01/30/24 0758     Visit Number 12    Number of Visits 18    Date for PT Re-Evaluation 02/29/24    Authorization Type [employer]    Progress Note Due on Visit 20    PT Start Time 0758    PT Stop Time 0842    PT Time Calculation (min) 44 min    Equipment Utilized During Treatment Gait belt    Activity Tolerance Patient tolerated treatment well    Behavior During Therapy WFL for tasks assessed/performed                   Past Medical History:  Diagnosis Date   Allergic rhinitis    Diabetes mellitus    TYPE 2   Dyslipidemia    Hyperkalemia    Hypertension    Insomnia    Pt stated no trouble falling or staying asleep.   RA (rheumatoid arthritis) (HCC)    Past Surgical History:  Procedure Laterality Date   BREAST CYST EXCISION Right 1967   HAND TENDON SURGERY     Right   OVARIAN CYST SURGERY Right 1988   Patient Active Problem List   Diagnosis Date Noted   Iron deficiency anemia 01/20/2019   Encounter for long-term (current) use of other medications 02/29/2012   Hypertension    Diabetes mellitus    Hypokalemia    RA (rheumatoid arthritis) (HCC)    EDEMA 08/27/2010   CARPAL TUNNEL SYNDROME, BILATERAL 10/28/2009   NUMBNESS 10/28/2009   Diabetes mellitus without complication (HCC) 09/20/2007   Hyperlipidemia 09/19/2007   Allergic rhinitis 09/19/2007   INSOMNIA 09/19/2007    ONSET DATE: 11/16/2023 referral  REFERRING DIAG: R29.6 (ICD-10-CM) - Falls frequently  THERAPY DIAG:  Muscle weakness (generalized)  Other abnormalities of gait and mobility  Unsteadiness on feet  Rationale for Evaluation and Treatment: Rehabilitation  SUBJECTIVE:                                                                                                                                                                                              SUBJECTIVE STATEMENT: Pt denies any falls since last visit, ongoing chronic aches and pains from arthritis. Pt reports she has a cold going on, is wearing a mask. Pt has not been doing much of anything over the past week since she has been sick, has  been feeling dizzy when she stands up.  Pt accompanied by: self  PERTINENT HISTORY: arthritis, DM, HTN, RA  PAIN:  Are you having pain? Yes: NPRS scale: 8/10 Pain location: whole body Pain description: achy  PRECAUTIONS: Fall  PATIENT GOALS: to not fall  OBJECTIVE:  Note: Objective measures were completed at Evaluation unless otherwise noted.  DIAGNOSTIC FINDINGS: none recent                                                                                                                               TREATMENT:  Vitals:   01/30/24 0805  BP: (!) 165/72  Pulse: 89   BP assessed in sitting in RUE at rest, elevated but [facility] for safe participation in PT session this date. Pt continues to monitor her BP at home as well and reports it is not normally this elevated.  TherAct To work on dynamic standing balance and dual-tasking: Bean bag toss to color-coordinated vinyl dots with forward step x 12 reps Added in stance/forward step on compliant surface x 12 reps Added in cognitive task of naming items of each color x 12 reps Overall CGA to min A needed for balance  Resisted gait to work on balance reactions and strategies: Steady resistance x 115 ft +multidirectional perturbations x 115 ft Able to recover balance with use of stepping strategy SpO2 96% on RA, pt SOA due to having a cold but recovers with seated rest break  To work on improving step length and LE clearance as well as SLS balance: 4 square stepping over two quad canes CW/CCW x 3 reps each direction, min A for balance Several LOB when  stepping laterally or backwards, needs min A to recover and prevent a fall Added in 4# ankle weights B for 2nd round of 3 laps CW/CCW, increased difficulty stepping Removed 4# ankle weights B for 3rd round of 3 laps CW/CCW, improved LE clearance  PATIENT EDUCATION: Education details: continue HEP Person educated: Patient Education method: Explanation Education comprehension: verbalized understanding and needs further education  HOME EXERCISE PROGRAM: Access Code: [email or web address] URL: [email or web address] Date: 12/26/2023 Prepared by: Peter Congo  Exercises - Romberg Stance Eyes Closed on Foam Pad  - 1 x daily - 7 x weekly - 1 sets - 3-5 reps - 30 sec hold - Romberg Stance on Foam Pad with Head Rotation  - 1 x daily - 7 x weekly - 1 sets - 3-5 reps - 30 sec hold - Romberg Stance with Head Nods on Foam Pad  - 1 x daily - 7 x weekly - 1 sets - 3-5 reps - 30 sec hold - Wide Tandem Stance on Foam Pad with Eyes Closed  - 1 x daily - 7 x weekly - 1 sets - 3-5 reps - 30 sec hold - Tandem Walking with Counter Support  - 1 x daily - 7 x weekly - 3 sets - 10 reps   GOALS: Goals reviewed with  patient? Yes  SHORT TERM GOALS: Target date: 12/22/23  Pt will be independent with initial HEP for improved balance and functional strength  Baseline: to be provided Goal status: MET  2.  Pt will improve TUG to </= 13 secs to demonstrated reduced fall risk Baseline: 17.19s no AD, 12.47 sec no AD (1/16) Goal status: MET  3.  Patient will score >/= 52/56 on the BBS to demonstrate improved balance Baseline: 43/56, 53/56 (1/16) Goal status: MET  4.  Pt will improve gait speed to >/= .69m/s to demonstrate improved community ambulation Baseline: .37m/s, 0.79 m/s (1/16) Goal status: IN PROGRESS  5.  Pt will improve 5x STS to </= 25 sec to demo improved functional LE strength and balance  Baseline: 32.06s no UE, 22.18 sec hands on thighs  (1/16) Goal status: MET  6.  MCTSIB to be assessed  and LTG added Baseline: to be assessed, assessed 12/31 - see LTG Goal status: MET   LONG TERM GOALS: Target date: 01/19/24  Pt will be independent with final HEP for improved balance and functional strength  Baseline: to be provided Goal status: IN PROGRESS  Pt will improve gait speed to >/= .65m/s to demonstrate improved community ambulation Baseline: .12m/s, 0.79 m/s (1/16), 0.68 m/s (2/13) Goal status: NOT MET  Pt will improve 5x STS to </= 20 sec to demo improved functional LE strength and balance  Baseline: 32.06s no UE, 22.18 sec hands on thighs  (1/16), 32 sec hands on thighs (2/13) Goal status: IN PROGRESS  Pt will improve her score on Condition 4 of the mCTSIB to 30 seconds Baseline: 15 sec (12/31), 19 sec (2/13) Goal status: IN PROGRESS  NEW SHORT TERM GOALS=LONG TERM GOALS due to length of POC   NEW LONG TERM GOALS:  Target date: 02/16/2024  1.  Pt will improve FGA to >/= 16/30 to demonstrate improved balance and reduced fall risk  Baseline: 16/30 Goal status: REVISED  2. Pt will be independent with final HEP for improved balance and functional strength  Baseline: to be provided Goal status: IN PROGRESS  3. Pt will improve gait speed to >/= .59m/s to demonstrate improved community ambulation Baseline: .26m/s, 0.79 m/s (1/16), 0.68 m/s (2/13) Goal status: NOT MET  4. Pt will improve 5x STS to </= 20 sec to demo improved functional LE strength and balance  Baseline: 32.06s no UE, 22.18 sec hands on thighs  (1/16), 32 sec hands on thighs (2/13) Goal status: IN PROGRESS  Pt will improve her score on Condition 4 of the mCTSIB to 30 seconds Baseline: 15 sec (12/31), 19 sec (2/13) Goal status: IN PROGRESS   ASSESSMENT:  CLINICAL IMPRESSION: Emphasis of skilled PT session on working on balance strategies, dynamic balance on compliant surfaces, with obstacle navigation, in SLS, and with dual-tasking. Pt exhibits most impaired balance with cognitive dual-tasking and  with SLS stepping over elevated obstacles. Pt remains a high fall risk and continues to benefit from skilled PT services to work towards increased safety and independence with functional mobility. Continue POC.     OBJECTIVE IMPAIRMENTS: Abnormal gait, cardiopulmonary status limiting activity, decreased balance, decreased endurance, decreased knowledge of condition, decreased knowledge of use of DME, difficulty walking, decreased strength, and pain.   ACTIVITY LIMITATIONS: carrying, lifting, squatting, stairs, locomotion level, and caring for others  PARTICIPATION LIMITATIONS: meal prep, cleaning, interpersonal relationship, driving, shopping, and community activity  PERSONAL FACTORS: Age, Past/current experiences, Time since onset of injury/illness/exacerbation, and 3+ comorbidities: see above  are also  affecting patient's functional outcome.   REHAB POTENTIAL: Good  CLINICAL DECISION MAKING: Stable/uncomplicated  EVALUATION COMPLEXITY: Low  PLAN:  PT FREQUENCY: 2x/week  PT DURATION: 6 weeks+4 weeks (recert)  PLANNED INTERVENTIONS: 16109- PT Re-evaluation, 97110-Therapeutic exercises, 97530- Therapeutic activity, [id number]- Neuromuscular re-education, 97535- Self Care, 60454- Manual therapy, [phone]- Gait training, [phone]- Orthotic Fit/training, [phone]- Canalith repositioning, [phone]- Aquatic Therapy, Patient/Family education, Balance training, Stair training, Dry Needling, Vestibular training, Visual/preceptual remediation/compensation, and DME instructions  PLAN FOR NEXT SESSION: Upgrade/Add to HEP PRN, functional strength, functional balance, stepping strategies, reaching outside BOS and across midline (Blaze pods), rockerboard, turns, resisted gait, dual tasking   Peter Congo, PT Peter Congo, PT, DPT, CSRS   01/30/2024, 8:43 AM

## 2024-01-31 DIAGNOSIS — J069 Acute upper respiratory infection, unspecified: Secondary | ICD-10-CM | POA: Diagnosis not present

## 2024-02-01 ENCOUNTER — Ambulatory Visit: Payer: Medicare HMO

## 2024-02-05 ENCOUNTER — Ambulatory Visit: Payer: Medicare HMO | Admitting: Endocrinology

## 2024-02-05 ENCOUNTER — Encounter: Payer: Self-pay | Admitting: Endocrinology

## 2024-02-05 VITALS — BP 108/70 | HR 87 | Ht 64.0 in | Wt 160.0 lb

## 2024-02-05 DIAGNOSIS — N189 Chronic kidney disease, unspecified: Secondary | ICD-10-CM | POA: Diagnosis not present

## 2024-02-05 DIAGNOSIS — Z7984 Long term (current) use of oral hypoglycemic drugs: Secondary | ICD-10-CM

## 2024-02-05 DIAGNOSIS — E1140 Type 2 diabetes mellitus with diabetic neuropathy, unspecified: Secondary | ICD-10-CM

## 2024-02-05 DIAGNOSIS — E118 Type 2 diabetes mellitus with unspecified complications: Secondary | ICD-10-CM

## 2024-02-05 DIAGNOSIS — E119 Type 2 diabetes mellitus without complications: Secondary | ICD-10-CM

## 2024-02-05 DIAGNOSIS — E1122 Type 2 diabetes mellitus with diabetic chronic kidney disease: Secondary | ICD-10-CM

## 2024-02-05 LAB — POCT GLYCOSYLATED HEMOGLOBIN (HGB A1C): Hemoglobin A1C: 5.8 % — AB (ref 4.0–5.6)

## 2024-02-05 MED ORDER — METFORMIN HCL ER 500 MG PO TB24: 500.0000 mg | ORAL_TABLET | Freq: Two times a day (BID) | ORAL | 2 refills | Status: DC

## 2024-02-05 NOTE — Progress Notes (Signed)
 Outpatient Endocrinology Note [place] Laroy Mustard, MD  02/05/24  Patient's Name: April [name]    DOB: 1940-12-30    MRN: [id number]                                                    REASON OF VISIT: Follow up of type 2 diabetes mellitus /hypertension/hypokalemia  PCP: Sigmund Hazel, MD  HISTORY OF PRESENT ILLNESS:   April [name] is a 83 y.o. old female with past medical history listed below, is here for follow up for type 2 diabetes mellitus /hypertension/hypokalemia.  Patient was last seen by Dr. Lucianne Muss in June 2024.  Pertinent Diabetes History: Patient was previously seen by Dr. Lucianne Muss and was last seen in June 2024. Patient has type 2 diabetes mellitus since at least 2014.  She has well-controlled diabetes on metformin 500 mg 2 times a day.  Chronic Diabetes Complications : Retinopathy: no. Last ophthalmology exam was done on annually reportedly, following with ophthalmology regularly.  Nephropathy: CKD III Peripheral neuropathy:yes, on gabapentin. Coronary artery disease: no Stroke: mo  Relevant comorbidities and cardiovascular risk factors: Obesity: no Body mass index is 27.46 kg/m.  Hypertension: Yes  Hyperlipidemia : Yes, on statin   Current / Home Diabetic regimen includes: Metformin XR 500 mg two times a day.   Prior diabetic medications: She had side effect with higher doses of regular metformin.  Glycemic data:   She has Accu-Chek guide glucometer.  Not able to download glucometer in the clinic today.  Blood sugar reviewed directly from the meter as follows.  She has been checking blood sugar in the morning fasting daily.   128, 101, 106, 121, 116, 101,  Hypoglycemia: Patient has no hypoglycemic episodes. Patient has hypoglycemia awareness, has a glucagon ER kit and diabetic alert device.   Factors modifying glucose control: 1.  Diabetic diet assessment: Eating vegetables and low-fat meals.  2.  Staying active or exercising: Cannot exercise due to  joint pain.  3.  Medication compliance: compliant all of the time.  # Hypertension : -Diagnosed around 1979. Previously she was taking Avapro, clonidine 0.6 at bedtime and Dyazide but blood pressure was relatively high with this regimen. For evaluation of her hyperaldosteronism she was switched to labetalol 100 mg twice a day, Tenex 2 mg and doxazosin. Evaluation for hyperaldosteronism was done, aldosterone level was 6.0 along with a relatively low renin level. With this regimen her blood pressure was much better She was subsequently started on Aldactone to help with her severe hypokalemia and doxazosin and guanfacine were stopped. Previously also on HCTZ   CURRENT regimen of Aldactone and labetalol   # Hypokalemia -Hypokalemia previously had been a significant longstanding problem and associated with hypertension. Previously had  been prescribed 6 tablets of potassium daily. She was off her diuretics and Avapro when her evaluation for hyperaldosteronism was done, aldosterone level was 6.0 along with a relatively low renin level. She has a normal potassium level previously with Aldactone, 50 mg daily along with 1 tablet of potassium 20 mEq daily.  Currently taking potassium chloride 10 Meq tablet taking half tablet daily.   Interval history  Glucometer data as reviewed above.  Mostly acceptable blood sugar.  Hemoglobin A1c today 5.8%.  She has been taking metformin denies any issues or side effects.  Blood pressure well-controlled.  She has  no other complaints today.  REVIEW OF SYSTEMS As per history of present illness.   PAST MEDICAL HISTORY: Past Medical History:  Diagnosis Date   Allergic rhinitis    Diabetes mellitus    TYPE 2   Dyslipidemia    Hyperkalemia    Hypertension    Insomnia    Pt stated no trouble falling or staying asleep.   RA (rheumatoid arthritis) (HCC)     PAST SURGICAL HISTORY: Past Surgical History:  Procedure Laterality Date   BREAST CYST EXCISION Right  1967   HAND TENDON SURGERY     Right   OVARIAN CYST SURGERY Right 1988    ALLERGIES: Allergies  Allergen Reactions   Acetaminophen Nausea Only   Actos [Pioglitazone Hydrochloride]     edema   Aspirin Nausea Only   Atorvastatin Other (See Comments)    Felt drunk/high floating   Latex     Other Reaction(s): INFECTION   Lisinopril     Other Reaction(s): dry throat/cough/st   Morphine Sulfate Other (See Comments)    Burns injecting "arm is on fire"   Naproxen Sodium Nausea Only   Sitagliptin Phosphate Nausea And Vomiting    FAMILY HISTORY:  Family History  Problem Relation Age of Onset   Diabetes Mother    Diabetes Father    Cancer Father        Prostate   Diabetes Sister    Stroke Maternal Grandmother        Cause of death   Healthy Brother     SOCIAL HISTORY: Social History   Socioeconomic History   Marital status: Married    Spouse name: Not on file   Number of children: Not on file   Years of education: Not on file   Highest education level: Not on file  Occupational History   Not on file  Tobacco Use   Smoking status: Never   Smokeless tobacco: Never  Vaping Use   Vaping status: Never Used  Substance and Sexual Activity   Alcohol use: No   Drug use: No   Sexual activity: Not on file  Other Topics Concern   Not on file  Social History Narrative   Not on file   Social Drivers of Health   Financial Resource Strain: Not on file  Food Insecurity: No Food Insecurity (03/10/2020)   Hunger Vital Sign    Worried About Running Out of Food in the Last Year: Never true    Ran Out of Food in the Last Year: Never true  Transportation Needs: No Transportation Needs (03/10/2020)   PRAPARE - Administrator, Civil Service (Medical): No    Lack of Transportation (Non-Medical): No  Physical Activity: Not on file  Stress: Not on file  Social Connections: Not on file    MEDICATIONS:  Current Outpatient Medications  Medication Sig Dispense Refill    Accu-Chek Softclix Lancets lancets Use to check blood sugar once daily 100 each 2   azithromycin (ZITHROMAX) 250 MG tablet Take by mouth.     benzonatate (TESSALON) 100 MG capsule Take 100 mg by mouth 3 (three) times daily as needed.     Blood Glucose Monitoring Suppl (ACCU-CHEK GUIDE) w/Device KIT Use to test blood sugar once daily 1 kit 0   Cetirizine HCl (ZYRTEC PO) 1 tab     Coenzyme Q10 (CO Q 10) 100 MG CAPS Take by mouth daily.     ezetimibe (ZETIA) 10 MG tablet Take 10 mg by mouth daily.  fluticasone (FLONASE) 50 MCG/ACT nasal spray Place 1 spray into both nostrils as needed for allergies.     folic acid (FOLVITE) 0.5 MG tablet Take 0.5 mg by mouth daily.      gabapentin (NEURONTIN) 100 MG capsule Take 100 mg by mouth 2 (two) times daily.     Ginger 500 MG CAPS Take 1 capsule by mouth daily.      glucose blood (ACCU-CHEK GUIDE TEST) test strip Use to test blood glucose before meals and at bedtime 300 each 12   glucose blood (ACCU-CHEK GUIDE) test strip Use to check blood sugar once a day 100 strip 2   labetalol (NORMODYNE) 100 MG tablet TAKE 0.5 TABLETS BY MOUTH 2 TIMES DAILY. 90 tablet 0   montelukast (SINGULAIR) 10 MG tablet Take 10 mg by mouth daily.      potassium chloride SA (KLOR-CON M20) 20 MEQ tablet TAKE 1/2 TABLET BY MOUTH EVERY DAY 45 tablet 1   pravastatin (PRAVACHOL) 80 MG tablet Take 1 tablet (80 mg total) by mouth daily. 90 tablet 1   spironolactone (ALDACTONE) 100 MG tablet Take 1 tablet (100 mg total) by mouth daily. 90 tablet 3   tapentadol (NUCYNTA) 50 MG tablet Take 50 mg by mouth as needed for severe pain (pain score 7-10) (spasms).     Turmeric 450 MG CAPS Take 1 capsule by mouth daily.      vitamin B-12 (CYANOCOBALAMIN) 1000 MCG tablet Take 1,000 mcg by mouth daily.     metFORMIN (GLUCOPHAGE-XR) 500 MG 24 hr tablet Take 1 tablet (500 mg total) by mouth 2 (two) times daily with a meal. 180 tablet 2   No current facility-administered medications for this visit.     PHYSICAL EXAM: Vitals:   02/05/24 0822  BP: 108/70  Pulse: 87  SpO2: 98%  Weight: 160 lb (72.6 kg)  Height: 5\' 4"  (1.626 m)    Body mass index is 27.46 kg/m.  Wt Readings from Last 3 Encounters:  02/05/24 160 lb (72.6 kg)  01/17/24 162 lb 6.4 oz (73.7 kg)  10/03/23 166 lb 6.4 oz (75.5 kg)    General: Well developed, well nourished female in no apparent distress.  HEENT: AT/, no external lesions.  Eyes: Conjunctiva clear and no icterus. Neck: Neck supple  Lungs: Respirations not labored Neurologic: Alert, oriented, normal speech Extremities / Skin: Dry.  Psychiatric: Does not appear depressed or anxious  Diabetic Foot Exam - Simple   Simple Foot Form Diabetic Foot exam was performed with the following findings: Yes 02/05/2024  8:38 AM  Visual Inspection See comments: Yes Sensation Testing Intact to touch and monofilament testing bilaterally: Yes Pulse Check See comments: Yes Comments DP 2 + bilaterally.   B/l bunions and callus +. No ulcer.       LABS Reviewed Lab Results  Component Value Date   HGBA1C 5.8 (A) 02/05/2024   HGBA1C 5.8 10/03/2023   HGBA1C 5.3 10/03/2023   Lab Results  Component Value Date   FRUCTOSAMINE 235 06/29/2020   FRUCTOSAMINE 211 02/24/2020   Lab Results  Component Value Date   CHOL 154 05/12/2023   HDL 79.30 05/12/2023   LDLCALC 65 05/12/2023   LDLDIRECT 85.0 10/09/2019   TRIG 51.0 05/12/2023   CHOLHDL 2 05/12/2023   Lab Results  Component Value Date   MICRALBCREAT 2.0 05/12/2023   MICRALBCREAT 1.6 04/18/2022   Lab Results  Component Value Date   CREATININE 0.93 10/03/2023   CREATININE 0.93 10/03/2023   Lab Results  Component  Value Date   GFR 58.12 (L) 10/03/2023   GFR 58.12 (L) 10/03/2023    Latest Reference Range & Units 07/28/14 09:31  ALDOSTERONE ng/dL 6  PRA LC/MS/MS 6.04 - 5.82 ng/mL/h 0.21 (L)  ALDO / PRA Ratio 0.9 - 28.9 Ratio 28.6  (L): Data is abnormally low  ASSESSMENT / PLAN  1. Controlled  type 2 diabetes mellitus with complication, without long-term current use of insulin (HCC)   2. Diabetes mellitus, stable (HCC)      Diabetes Mellitus type 2, complicated by CKD /neuropathy. - Diabetic status / severity: Controlled.  Lab Results  Component Value Date   HGBA1C 5.8 (A) 02/05/2024    - Hemoglobin A1c goal : <7%  - Medications: No change.  I) continue metformin extended release 500 mg 2 times a day.  - Home glucose testing: Few times a week in the morning fasting. - Discussed/ Gave Hypoglycemia treatment plan.  # Consult : not required at this time.   # Annual urine for microalbuminuria/ creatinine ratio, no microalbuminuria currently.  She has CKD 3.  Last  Lab Results  Component Value Date   MICRALBCREAT 2.0 05/12/2023    # Foot check nightly / neuropathy, continue gabapentin.  # Annual dilated diabetic eye exams.   - Diet: Make healthy diabetic food choices - Life style / activity / exercise: Discussed.  2. Blood pressure  -  BP Readings from Last 1 Encounters:  02/05/24 108/70    - Control is in target.  - No change in current plans.  3. Lipid status / Hyperlipidemia - Last  Lab Results  Component Value Date   LDLCALC 65 05/12/2023   - Continue pravastatin 80 mg daily.  Zetia 10 mg daily.  # Hypertension -Blood pressure controlled. -Continue current dose of spironolactone/Aldactone 100 mg daily. -Continue current dose of labetalol 50 mg 2 times a day.  # Hypokalemia -Continue current dose of potassium chloride 20 mEq half tablet daily.  Hypertension and hypokalemia comanaged with primary care provider.  Shaquavia was seen today for follow-up.  Diagnoses and all orders for this visit:  Controlled type 2 diabetes mellitus with complication, without long-term current use of insulin (HCC) -     POCT glycosylated hemoglobin (Hb A1C) -     Microalbumin / creatinine urine ratio -     Lipid panel -     BASIC METABOLIC PANEL WITH GFR -      Hemoglobin A1c  Diabetes mellitus, stable (HCC) -     metFORMIN (GLUCOPHAGE-XR) 500 MG 24 hr tablet; Take 1 tablet (500 mg total) by mouth 2 (two) times daily with a meal.     DISPOSITION Follow up in clinic in 4 months suggested.  Labs prior to follow-up visit as ordered.   All questions answered and patient verbalized understanding of the plan.  [place] Vanesha Athens, MD [facility] Endocrinology [facility] [address] [place], [address] [place], [place] [address] Phone # [phone]  At least part of this note was generated using voice recognition software. Inadvertent word errors may have occurred, which were not recognized during the proofreading process.

## 2024-02-06 ENCOUNTER — Ambulatory Visit: Payer: Medicare HMO | Attending: Family Medicine

## 2024-02-06 VITALS — BP 136/65 | HR 84

## 2024-02-06 DIAGNOSIS — R2681 Unsteadiness on feet: Secondary | ICD-10-CM | POA: Diagnosis not present

## 2024-02-06 DIAGNOSIS — M6281 Muscle weakness (generalized): Secondary | ICD-10-CM

## 2024-02-06 DIAGNOSIS — R2689 Other abnormalities of gait and mobility: Secondary | ICD-10-CM | POA: Diagnosis not present

## 2024-02-06 DIAGNOSIS — R293 Abnormal posture: Secondary | ICD-10-CM | POA: Diagnosis not present

## 2024-02-06 NOTE — Therapy (Signed)
 OUTPATIENT PHYSICAL THERAPY NEURO TREATMENT    Patient Name: April [name] MRN: [id number] DOB:27-Oct-1941, 83 y.o., female Today's Date: 02/06/2024   PCP: Sigmund Hazel, MD REFERRING PROVIDER: Shireen Quan, DO   END OF SESSION:  PT End of Session - 02/06/24 0851     Visit Number 13    Number of Visits 18    Date for PT Re-Evaluation 02/29/24    Authorization Type [employer]    Progress Note Due on Visit 20    PT Start Time [phone]   patient late   PT Stop Time 0928    PT Time Calculation (min) 39 min    Equipment Utilized During Treatment Gait belt    Activity Tolerance Patient tolerated treatment well    Behavior During Therapy WFL for tasks assessed/performed             Past Medical History:  Diagnosis Date   Allergic rhinitis    Diabetes mellitus    TYPE 2   Dyslipidemia    Hyperkalemia    Hypertension    Insomnia    Pt stated no trouble falling or staying asleep.   RA (rheumatoid arthritis) (HCC)    Past Surgical History:  Procedure Laterality Date   BREAST CYST EXCISION Right 1967   HAND TENDON SURGERY     Right   OVARIAN CYST SURGERY Right 1988   Patient Active Problem List   Diagnosis Date Noted   Iron deficiency anemia 01/20/2019   Encounter for long-term (current) use of other medications 02/29/2012   Hypertension    Diabetes mellitus    Hypokalemia    RA (rheumatoid arthritis) (HCC)    EDEMA 08/27/2010   CARPAL TUNNEL SYNDROME, BILATERAL 10/28/2009   NUMBNESS 10/28/2009   Diabetes mellitus without complication (HCC) 09/20/2007   Hyperlipidemia 09/19/2007   Allergic rhinitis 09/19/2007   INSOMNIA 09/19/2007    ONSET DATE: 11/16/2023 referral  REFERRING DIAG: R29.6 (ICD-10-CM) - Falls frequently  THERAPY DIAG:  Muscle weakness (generalized)  Other abnormalities of gait and mobility  Unsteadiness on feet  Abnormal posture  Rationale for Evaluation and Treatment: Rehabilitation  SUBJECTIVE:                                                                                                                                                                                              SUBJECTIVE STATEMENT: Patient arrives to clinic. Feeling better since starting rx. Did have 1 fall: bending down in pantry and fell forward- denies injury. Was able to get up by herself.   Pt accompanied by: self  PERTINENT HISTORY: arthritis, DM, HTN, RA  PAIN:  Are you having pain? Yes: NPRS scale: 8/10 Pain location: whole body Pain description: achy  PRECAUTIONS: Fall  PATIENT GOALS: to not fall  OBJECTIVE:  Note: Objective measures were completed at Evaluation unless otherwise noted.  DIAGNOSTIC FINDINGS: none recent                                                                                                                               TREATMENT:  Vitals:   02/06/24 0855  BP: 136/65  Pulse: 84    BP assessed in sitting in RUE at rest  TherAct -Ambulatory UE D2 placing bean bags on top of cones -ambulatory SLS tapping cone + CGA -modified SLS on medball reading Edwyna Shell Chart (progressed to busy background)   PATIENT EDUCATION: Education details: continue HEP Person educated: Patient Education method: Explanation Education comprehension: verbalized understanding and needs further education  HOME EXERCISE PROGRAM: Access Code: [email or web address] URL: [email or web address] Date: 12/26/2023 Prepared by: Peter Congo  Exercises - Romberg Stance Eyes Closed on Foam Pad  - 1 x daily - 7 x weekly - 1 sets - 3-5 reps - 30 sec hold - Romberg Stance on Foam Pad with Head Rotation  - 1 x daily - 7 x weekly - 1 sets - 3-5 reps - 30 sec hold - Romberg Stance with Head Nods on Foam Pad  - 1 x daily - 7 x weekly - 1 sets - 3-5 reps - 30 sec hold - Wide Tandem Stance on Foam Pad with Eyes Closed  - 1 x daily - 7 x weekly - 1 sets - 3-5 reps - 30 sec hold - Tandem Walking with Counter Support  - 1 x daily - 7 x  weekly - 3 sets - 10 reps   GOALS: Goals reviewed with patient? Yes  SHORT TERM GOALS: Target date: 12/22/23  Pt will be independent with initial HEP for improved balance and functional strength  Baseline: to be provided Goal status: MET  2.  Pt will improve TUG to </= 13 secs to demonstrated reduced fall risk Baseline: 17.19s no AD, 12.47 sec no AD (1/16) Goal status: MET  3.  Patient will score >/= 52/56 on the BBS to demonstrate improved balance Baseline: 43/56, 53/56 (1/16) Goal status: MET  4.  Pt will improve gait speed to >/= .82m/s to demonstrate improved community ambulation Baseline: .16m/s, 0.79 m/s (1/16) Goal status: IN PROGRESS  5.  Pt will improve 5x STS to </= 25 sec to demo improved functional LE strength and balance  Baseline: 32.06s no UE, 22.18 sec hands on thighs  (1/16) Goal status: MET  6.  MCTSIB to be assessed and LTG added Baseline: to be assessed, assessed 12/31 - see LTG Goal status: MET   LONG TERM GOALS: Target date: 01/19/24  Pt will be independent with final HEP for improved balance and functional strength  Baseline: to be provided Goal status: IN PROGRESS  Pt will improve gait speed  to >/= .66m/s to demonstrate improved community ambulation Baseline: .16m/s, 0.79 m/s (1/16), 0.68 m/s (2/13) Goal status: NOT MET  Pt will improve 5x STS to </= 20 sec to demo improved functional LE strength and balance  Baseline: 32.06s no UE, 22.18 sec hands on thighs  (1/16), 32 sec hands on thighs (2/13) Goal status: IN PROGRESS  Pt will improve her score on Condition 4 of the mCTSIB to 30 seconds Baseline: 15 sec (12/31), 19 sec (2/13) Goal status: IN PROGRESS  NEW SHORT TERM GOALS=LONG TERM GOALS due to length of POC   NEW LONG TERM GOALS:  Target date: 02/16/2024  1.  Pt will improve FGA to >/= 16/30 to demonstrate improved balance and reduced fall risk  Baseline: 16/30 Goal status: REVISED  2. Pt will be independent with final HEP for  improved balance and functional strength  Baseline: to be provided Goal status: IN PROGRESS  3. Pt will improve gait speed to >/= .61m/s to demonstrate improved community ambulation Baseline: .16m/s, 0.79 m/s (1/16), 0.68 m/s (2/13) Goal status: NOT MET  4. Pt will improve 5x STS to </= 20 sec to demo improved functional LE strength and balance  Baseline: 32.06s no UE, 22.18 sec hands on thighs  (1/16), 32 sec hands on thighs (2/13) Goal status: IN PROGRESS  Pt will improve her score on Condition 4 of the mCTSIB to 30 seconds Baseline: 15 sec (12/31), 19 sec (2/13) Goal status: IN PROGRESS   ASSESSMENT:  CLINICAL IMPRESSION: Patient seen for skilled PT session with emphasis on dynamic balance practice. Improving balance strategies with appropriate stepping response for larger perturbations. Greater instances of LOB when standing on R LE with more pronounced LOB with dual cog task. Discussed implications of this as it pertains to patients more recent falls. Continue POC.   OBJECTIVE IMPAIRMENTS: Abnormal gait, cardiopulmonary status limiting activity, decreased balance, decreased endurance, decreased knowledge of condition, decreased knowledge of use of DME, difficulty walking, decreased strength, and pain.   ACTIVITY LIMITATIONS: carrying, lifting, squatting, stairs, locomotion level, and caring for others  PARTICIPATION LIMITATIONS: meal prep, cleaning, interpersonal relationship, driving, shopping, and community activity  PERSONAL FACTORS: Age, Past/current experiences, Time since onset of injury/illness/exacerbation, and 3+ comorbidities: see above  are also affecting patient's functional outcome.   REHAB POTENTIAL: Good  CLINICAL DECISION MAKING: Stable/uncomplicated  EVALUATION COMPLEXITY: Low  PLAN:  PT FREQUENCY: 2x/week  PT DURATION: 6 weeks+4 weeks (recert)  PLANNED INTERVENTIONS: 40981- PT Re-evaluation, 97110-Therapeutic exercises, 97530- Therapeutic activity,  [id number]- Neuromuscular re-education, 97535- Self Care, [id number]- Manual therapy, [phone]- Gait training, [phone]- Orthotic Fit/training, [phone]- Canalith repositioning, [phone]- Aquatic Therapy, Patient/Family education, Balance training, Stair training, Dry Needling, Vestibular training, Visual/preceptual remediation/compensation, and DME instructions  PLAN FOR NEXT SESSION: Upgrade/Add to HEP PRN, functional strength, functional balance, stepping strategies, reaching outside BOS and across midline (Blaze pods), rockerboard, turns, resisted gait, dual tasking   Westley Foots, PT Westley Foots, PT, DPT, CBIS 02/06/2024, 9:42 AM

## 2024-02-08 ENCOUNTER — Ambulatory Visit: Payer: Medicare HMO | Admitting: Physical Therapy

## 2024-02-08 ENCOUNTER — Encounter: Payer: Self-pay | Admitting: Physical Therapy

## 2024-02-08 VITALS — BP 139/71 | HR 85

## 2024-02-08 DIAGNOSIS — M6281 Muscle weakness (generalized): Secondary | ICD-10-CM | POA: Diagnosis not present

## 2024-02-08 DIAGNOSIS — R2681 Unsteadiness on feet: Secondary | ICD-10-CM | POA: Diagnosis not present

## 2024-02-08 DIAGNOSIS — R2689 Other abnormalities of gait and mobility: Secondary | ICD-10-CM

## 2024-02-08 DIAGNOSIS — R293 Abnormal posture: Secondary | ICD-10-CM | POA: Diagnosis not present

## 2024-02-08 NOTE — Therapy (Signed)
 OUTPATIENT PHYSICAL THERAPY NEURO TREATMENT    Patient Name: April [name] MRN: [id number] DOB:26-Oct-1941, 83 y.o., female Today's Date: 02/08/2024   PCP: Sigmund Hazel, MD REFERRING PROVIDER: Shireen Quan, DO   END OF SESSION:  PT End of Session - 02/08/24 0858     Visit Number 14    Number of Visits 18    Date for PT Re-Evaluation 02/29/24    Authorization Type [employer]    Progress Note Due on Visit 20    PT Start Time 0855    PT Stop Time 0933    PT Time Calculation (min) 38 min    Equipment Utilized During Treatment Gait belt    Activity Tolerance Patient tolerated treatment well    Behavior During Therapy WFL for tasks assessed/performed             Past Medical History:  Diagnosis Date   Allergic rhinitis    Diabetes mellitus    TYPE 2   Dyslipidemia    Hyperkalemia    Hypertension    Insomnia    Pt stated no trouble falling or staying asleep.   RA (rheumatoid arthritis) (HCC)    Past Surgical History:  Procedure Laterality Date   BREAST CYST EXCISION Right 1967   HAND TENDON SURGERY     Right   OVARIAN CYST SURGERY Right 1988   Patient Active Problem List   Diagnosis Date Noted   Iron deficiency anemia 01/20/2019   Encounter for long-term (current) use of other medications 02/29/2012   Hypertension    Diabetes mellitus    Hypokalemia    RA (rheumatoid arthritis) (HCC)    EDEMA 08/27/2010   CARPAL TUNNEL SYNDROME, BILATERAL 10/28/2009   NUMBNESS 10/28/2009   Diabetes mellitus without complication (HCC) 09/20/2007   Hyperlipidemia 09/19/2007   Allergic rhinitis 09/19/2007   INSOMNIA 09/19/2007    ONSET DATE: 11/16/2023 referral  REFERRING DIAG: R29.6 (ICD-10-CM) - Falls frequently  THERAPY DIAG:  Muscle weakness (generalized)  Other abnormalities of gait and mobility  Unsteadiness on feet  Rationale for Evaluation and Treatment: Rehabilitation  SUBJECTIVE:                                                                                                                                                                                              SUBJECTIVE STATEMENT: Patient reports overall doing well. Denies falls and near falls since last here.   Pt accompanied by: self  PERTINENT HISTORY: arthritis, DM, HTN, RA  PAIN:  Are you having pain? Yes: NPRS scale: 8/10 Pain location: whole body Pain description: achy  PRECAUTIONS: Fall  PATIENT GOALS:  to not fall  OBJECTIVE:  Note: Objective measures were completed at Evaluation unless otherwise noted.  DIAGNOSTIC FINDINGS: none recent                                                                                                                               TREATMENT:  TherAct:  Vitals:   02/08/24 0900 02/08/24 0901 02/08/24 0907  BP: 121/78 115/63 139/71  Pulse: 74 79 85     Seated    Standing   After walking 3 laps (CGA)  Assessed vitals seated on [name] as noted above Reports no major dizziness with the drop from sitting to standing Walked 3 laps to assess BP functions with exercise and BP self regulates, making BP drops unlikely  Educated to take time when first getting up to allow BP to adjust   -Sit to stand with feet up on incline board and balance with 2 x 5 bean bag tosses in a row reaching behind to basket, picking up, and tossing to target ~15 feet out (CGA-SBA) -Sit to stand with feet up on balance beam and balance with 2 x 5 bean bag tosses in a row reaching behind to basket, picking up, and tossing to target ~15 feet out (CGA-SBA) -Sit to stand with feet up on balance beam and balance with 1 x 5 reps with single bean bag tosses reaching behind to basket, picking up, and tossing to target ~15 feet out (CGA-SBA)   For safety with functional reaching and transitional movement tasks  NMR - 5 x 4" hurdle step overs with step through pattern and ball bounce to self (successfully completed without losing ball 2/5 times) (CGA with minA  intermittently for ball management) - Resisted belt donned with black bungee bulled to near end range and step over 4" hurdle returns for modified SLS stability and pertubations with CGA-minA 2 X 10 reps  PATIENT EDUCATION: Education details: continue HEP + BP safety Person educated: Patient Education method: Explanation Education comprehension: verbalized understanding and needs further education  HOME EXERCISE PROGRAM: Access Code: [email or web address] URL: [email or web address] Date: 12/26/2023 Prepared by: Peter Congo  Exercises - Romberg Stance Eyes Closed on Foam Pad  - 1 x daily - 7 x weekly - 1 sets - 3-5 reps - 30 sec hold - Romberg Stance on Foam Pad with Head Rotation  - 1 x daily - 7 x weekly - 1 sets - 3-5 reps - 30 sec hold - Romberg Stance with Head Nods on Foam Pad  - 1 x daily - 7 x weekly - 1 sets - 3-5 reps - 30 sec hold - Wide Tandem Stance on Foam Pad with Eyes Closed  - 1 x daily - 7 x weekly - 1 sets - 3-5 reps - 30 sec hold - Tandem Walking with Counter Support  - 1 x daily - 7 x weekly - 3 sets - 10 reps   GOALS: Goals reviewed with patient? Yes  SHORT TERM GOALS: Target date: 12/22/23  Pt will be independent with initial HEP for improved balance and functional strength  Baseline: to be provided Goal status: MET  2.  Pt will improve TUG to </= 13 secs to demonstrated reduced fall risk Baseline: 17.19s no AD, 12.47 sec no AD (1/16) Goal status: MET  3.  Patient will score >/= 52/56 on the BBS to demonstrate improved balance Baseline: 43/56, 53/56 (1/16) Goal status: MET  4.  Pt will improve gait speed to >/= .55m/s to demonstrate improved community ambulation Baseline: .71m/s, 0.79 m/s (1/16) Goal status: IN PROGRESS  5.  Pt will improve 5x STS to </= 25 sec to demo improved functional LE strength and balance  Baseline: 32.06s no UE, 22.18 sec hands on thighs  (1/16) Goal status: MET  6.  MCTSIB to be assessed and LTG added Baseline: to be  assessed, assessed 12/31 - see LTG Goal status: MET   LONG TERM GOALS: Target date: 01/19/24  Pt will be independent with final HEP for improved balance and functional strength  Baseline: to be provided Goal status: IN PROGRESS  Pt will improve gait speed to >/= .74m/s to demonstrate improved community ambulation Baseline: .88m/s, 0.79 m/s (1/16), 0.68 m/s (2/13) Goal status: NOT MET  Pt will improve 5x STS to </= 20 sec to demo improved functional LE strength and balance  Baseline: 32.06s no UE, 22.18 sec hands on thighs  (1/16), 32 sec hands on thighs (2/13) Goal status: IN PROGRESS  Pt will improve her score on Condition 4 of the mCTSIB to 30 seconds Baseline: 15 sec (12/31), 19 sec (2/13) Goal status: IN PROGRESS  NEW SHORT TERM GOALS=LONG TERM GOALS due to length of POC   NEW LONG TERM GOALS:  Target date: 02/16/2024  1.  Pt will improve FGA to >/= 16/30 to demonstrate improved balance and reduced fall risk  Baseline: 16/30 Goal status: REVISED  2. Pt will be independent with final HEP for improved balance and functional strength  Baseline: to be provided Goal status: IN PROGRESS  3. Pt will improve gait speed to >/= .40m/s to demonstrate improved community ambulation Baseline: .62m/s, 0.79 m/s (1/16), 0.68 m/s (2/13) Goal status: NOT MET  4. Pt will improve 5x STS to </= 20 sec to demo improved functional LE strength and balance  Baseline: 32.06s no UE, 22.18 sec hands on thighs  (1/16), 32 sec hands on thighs (2/13) Goal status: IN PROGRESS  Pt will improve her score on Condition 4 of the mCTSIB to 30 seconds Baseline: 15 sec (12/31), 19 sec (2/13) Goal status: IN PROGRESS   ASSESSMENT:  CLINICAL IMPRESSION: Patient seen for skilled PT session with emphasis on vital assessment with monitoring for symptoms consistent with orthostatic hypotension and high level functional balance tasks. Patient with minor BP drop in standing but self regulates quickly with walking  exercise and patient asymptomatic. Remainder of session patient able to complete high level tasks noted above; most unsteady with transitional modified single leg stance with perturbations which most closely simulate falls with dogs. Anticipate that cognitive factor is likely contributing to falls. Continue POC.   OBJECTIVE IMPAIRMENTS: Abnormal gait, cardiopulmonary status limiting activity, decreased balance, decreased endurance, decreased knowledge of condition, decreased knowledge of use of DME, difficulty walking, decreased strength, and pain.   ACTIVITY LIMITATIONS: carrying, lifting, squatting, stairs, locomotion level, and caring for others  PARTICIPATION LIMITATIONS: meal prep, cleaning, interpersonal relationship, driving, shopping, and community activity  PERSONAL FACTORS: Age, Past/current experiences,  Time since onset of injury/illness/exacerbation, and 3+ comorbidities: see above  are also affecting patient's functional outcome.   REHAB POTENTIAL: Good  CLINICAL DECISION MAKING: Stable/uncomplicated  EVALUATION COMPLEXITY: Low  PLAN:  PT FREQUENCY: 2x/week  PT DURATION: 6 weeks+4 weeks (recert)  PLANNED INTERVENTIONS: 19147- PT Re-evaluation, 97110-Therapeutic exercises, 97530- Therapeutic activity, 97112- Neuromuscular re-education, 97535- Self Care, 82956- Manual therapy, [phone]- Gait training, [phone]- Orthotic Fit/training, [phone]- Canalith repositioning, [phone]- Aquatic Therapy, Patient/Family education, Balance training, Stair training, Dry Needling, Vestibular training, Visual/preceptual remediation/compensation, and DME instructions  PLAN FOR NEXT SESSION: Upgrade/Add to HEP PRN, functional strength, functional balance, stepping strategies, reaching outside BOS and across midline (Blaze pods), rockerboard, turns, resisted gait and other bungee work to simulate task with dogs, dual tasking   Carmelia Bake, PT Westley Foots, PT, DPT, CBIS 02/08/2024, 9:53 AM

## 2024-02-13 ENCOUNTER — Ambulatory Visit: Payer: Medicare HMO | Admitting: Physical Therapy

## 2024-02-13 DIAGNOSIS — R2689 Other abnormalities of gait and mobility: Secondary | ICD-10-CM

## 2024-02-13 DIAGNOSIS — R2681 Unsteadiness on feet: Secondary | ICD-10-CM

## 2024-02-13 DIAGNOSIS — R293 Abnormal posture: Secondary | ICD-10-CM | POA: Diagnosis not present

## 2024-02-13 DIAGNOSIS — M6281 Muscle weakness (generalized): Secondary | ICD-10-CM

## 2024-02-13 NOTE — Therapy (Signed)
 OUTPATIENT PHYSICAL THERAPY NEURO TREATMENT    Patient Name: April [name] MRN: [id number] DOB:[birth year], 83 y.o., female Today's Date: 02/13/2024   PCP: Sigmund Hazel, MD REFERRING PROVIDER: Shireen Quan, DO   END OF SESSION:  PT End of Session - 02/13/24 0941     Visit Number 15    Number of Visits 18    Date for PT Re-Evaluation 02/29/24    Authorization Type [employer]    Progress Note Due on Visit 20    PT Start Time 0940   pt arrived late   PT Stop Time 1013    PT Time Calculation (min) 33 min    Equipment Utilized During Treatment Gait belt    Activity Tolerance Patient tolerated treatment well    Behavior During Therapy WFL for tasks assessed/performed              Past Medical History:  Diagnosis Date   Allergic rhinitis    Diabetes mellitus    TYPE 2   Dyslipidemia    Hyperkalemia    Hypertension    Insomnia    Pt stated no trouble falling or staying asleep.   RA (rheumatoid arthritis) (HCC)    Past Surgical History:  Procedure Laterality Date   BREAST CYST EXCISION Right 1967   HAND TENDON SURGERY     Right   OVARIAN CYST SURGERY Right 1988   Patient Active Problem List   Diagnosis Date Noted   Iron deficiency anemia 01/20/2019   Encounter for long-term (current) use of other medications 02/29/2012   Hypertension    Diabetes mellitus    Hypokalemia    RA (rheumatoid arthritis) (HCC)    EDEMA 08/27/2010   CARPAL TUNNEL SYNDROME, BILATERAL 10/28/2009   NUMBNESS 10/28/2009   Diabetes mellitus without complication (HCC) 09/20/2007   Hyperlipidemia 09/19/2007   Allergic rhinitis 09/19/2007   INSOMNIA 09/19/2007    ONSET DATE: 11/16/2023 referral  REFERRING DIAG: R29.6 (ICD-10-CM) - Falls frequently  THERAPY DIAG:  Muscle weakness (generalized)  Other abnormalities of gait and mobility  Unsteadiness on feet  Rationale for Evaluation and Treatment: Rehabilitation  SUBJECTIVE:                                                                                                                                                                                              SUBJECTIVE STATEMENT: Pt reports she is feeling much better today, recovered from the cold she had. Pt denies any falls since last visit, just a few "stumbles". Pt feels like things are going "pretty good" with her balance.  Pt's husband was in the hospital last week  with the flu, back home now. Pt reports her arms/shoulders are hurting from helping to reposition him in the hospital bed.  Pt accompanied by: self  PERTINENT HISTORY: arthritis, DM, HTN, RA  PAIN:  Are you having pain? Yes: NPRS scale: 8/10 Pain location: whole body Pain description: achy  PRECAUTIONS: Fall  PATIENT GOALS: to not fall  OBJECTIVE:  Note: Objective measures were completed at Evaluation unless otherwise noted.  DIAGNOSTIC FINDINGS: none recent                                                                                                                               TREATMENT:  NMR To work on dynamic balance, SLS, and reactive balance: 5 Blaze pods on random setting with blue resistance band around hips.  Performed on 1 minute intervals with 30 rest periods.  Pt requires CGA guarding. Round 1:  semi-circle setup.  18 hits. Round 2:  semi-circle setup.  22 hits. Round 3:  semi-circle setup.  23 hits. Notable errors/deficits:  decreased balance in SLS especially with pull from resistance band Round 1:  semi-circle setup with 4" and 6" steps under pods.  25 hits. Round 2:  semi-circle setup with 4" and 6" steps under pods.  27 hits. Round 3:  semi-circle setup with 4" and 6" steps under pods.  29 hits. Notable errors/deficits:  decreased balance in SLS especially with pull from resistance band 5 Blaze pods on random setting.  Performed on 1 minute intervals with 30 rest periods.  Pt requires CGA guarding. Round 1:  lateral sidestepping over obstacles and to 4  and 6" steps setup.  14 hits. Round 2:  lateral sidestepping over obstacles and to 4 and 6" steps setup.  12 hits. Round 3:  lateral sidestepping over obstacles and to 4 and 6" steps setup.  15 hits. Notable errors/deficits:  decreased balance when sidestepping over obstacles    PATIENT EDUCATION: Education details: continue HEP, plan to d/c next visit Person educated: Patient Education method: Explanation Education comprehension: verbalized understanding and needs further education  HOME EXERCISE PROGRAM: Access Code: [email or web address] URL: [email or web address] Date: 12/26/2023 Prepared by: Peter Congo  Exercises - Romberg Stance Eyes Closed on Foam Pad  - 1 x daily - 7 x weekly - 1 sets - 3-5 reps - 30 sec hold - Romberg Stance on Foam Pad with Head Rotation  - 1 x daily - 7 x weekly - 1 sets - 3-5 reps - 30 sec hold - Romberg Stance with Head Nods on Foam Pad  - 1 x daily - 7 x weekly - 1 sets - 3-5 reps - 30 sec hold - Wide Tandem Stance on Foam Pad with Eyes Closed  - 1 x daily - 7 x weekly - 1 sets - 3-5 reps - 30 sec hold - Tandem Walking with Counter Support  - 1 x daily - 7 x weekly - 3 sets - 10 reps  GOALS: Goals reviewed with patient? Yes  SHORT TERM GOALS: Target date: 12/22/23  Pt will be independent with initial HEP for improved balance and functional strength  Baseline: to be provided Goal status: MET  2.  Pt will improve TUG to </= 13 secs to demonstrated reduced fall risk Baseline: 17.19s no AD, 12.47 sec no AD (1/16) Goal status: MET  3.  Patient will score >/= 52/56 on the BBS to demonstrate improved balance Baseline: 43/56, 53/56 (1/16) Goal status: MET  4.  Pt will improve gait speed to >/= .26m/s to demonstrate improved community ambulation Baseline: .78m/s, 0.79 m/s (1/16) Goal status: IN PROGRESS  5.  Pt will improve 5x STS to </= 25 sec to demo improved functional LE strength and balance  Baseline: 32.06s no UE, 22.18 sec hands on  thighs  (1/16) Goal status: MET  6.  MCTSIB to be assessed and LTG added Baseline: to be assessed, assessed 12/31 - see LTG Goal status: MET   LONG TERM GOALS: Target date: 01/19/24  Pt will be independent with final HEP for improved balance and functional strength  Baseline: to be provided Goal status: IN PROGRESS  Pt will improve gait speed to >/= .57m/s to demonstrate improved community ambulation Baseline: .54m/s, 0.79 m/s (1/16), 0.68 m/s (2/13) Goal status: NOT MET  Pt will improve 5x STS to </= 20 sec to demo improved functional LE strength and balance  Baseline: 32.06s no UE, 22.18 sec hands on thighs  (1/16), 32 sec hands on thighs (2/13) Goal status: IN PROGRESS  Pt will improve her score on Condition 4 of the mCTSIB to 30 seconds Baseline: 15 sec (12/31), 19 sec (2/13) Goal status: IN PROGRESS  NEW SHORT TERM GOALS=LONG TERM GOALS due to length of POC   NEW LONG TERM GOALS:  Target date: 02/16/2024  1.  Pt will improve FGA to >/= 16/30 to demonstrate improved balance and reduced fall risk  Baseline: 16/30 Goal status: REVISED  2. Pt will be independent with final HEP for improved balance and functional strength  Baseline: to be provided Goal status: IN PROGRESS  3. Pt will improve gait speed to >/= .74m/s to demonstrate improved community ambulation Baseline: .9m/s, 0.79 m/s (1/16), 0.68 m/s (2/13) Goal status: NOT MET  4. Pt will improve 5x STS to </= 20 sec to demo improved functional LE strength and balance  Baseline: 32.06s no UE, 22.18 sec hands on thighs  (1/16), 32 sec hands on thighs (2/13) Goal status: IN PROGRESS  Pt will improve her score on Condition 4 of the mCTSIB to 30 seconds Baseline: 15 sec (12/31), 19 sec (2/13) Goal status: IN PROGRESS   ASSESSMENT:  CLINICAL IMPRESSION: Session limited by patient's late arrival. Emphasis of skilled PT session on continuing to work on dynamic balance, balance reactions, and SLS stability. Pt with  most difficulty maintaining balance in SLS against resistance and with lateral stepping over obstacles. Pt does exhibit overall improved balance and decreased falls over the past few weeks, plan to assess LTG and d/c next visit. Continue POC.   OBJECTIVE IMPAIRMENTS: Abnormal gait, cardiopulmonary status limiting activity, decreased balance, decreased endurance, decreased knowledge of condition, decreased knowledge of use of DME, difficulty walking, decreased strength, and pain.   ACTIVITY LIMITATIONS: carrying, lifting, squatting, stairs, locomotion level, and caring for others  PARTICIPATION LIMITATIONS: meal prep, cleaning, interpersonal relationship, driving, shopping, and community activity  PERSONAL FACTORS: Age, Past/current experiences, Time since onset of injury/illness/exacerbation, and 3+ comorbidities: see above  are  also affecting patient's functional outcome.   REHAB POTENTIAL: Good  CLINICAL DECISION MAKING: Stable/uncomplicated  EVALUATION COMPLEXITY: Low  PLAN:  PT FREQUENCY: 2x/week  PT DURATION: 6 weeks+4 weeks (recert)  PLANNED INTERVENTIONS: 16109- PT Re-evaluation, 97110-Therapeutic exercises, 97530- Therapeutic activity, 97112- Neuromuscular re-education, 97535- Self Care, 60454- Manual therapy, [phone]- Gait training, [phone]- Orthotic Fit/training, [phone]- Canalith repositioning, [phone]- Aquatic Therapy, Patient/Family education, Balance training, Stair training, Dry Needling, Vestibular training, Visual/preceptual remediation/compensation, and DME instructions  PLAN FOR NEXT SESSION: assess LTG and d/c   Peter Congo, PT Peter Congo, PT, DPT, CSRS  02/13/2024, 10:13 AM

## 2024-02-15 ENCOUNTER — Ambulatory Visit: Payer: Medicare HMO | Admitting: Physical Therapy

## 2024-02-16 ENCOUNTER — Ambulatory Visit: Admitting: Physical Therapy

## 2024-02-16 DIAGNOSIS — R2689 Other abnormalities of gait and mobility: Secondary | ICD-10-CM | POA: Diagnosis not present

## 2024-02-16 DIAGNOSIS — M6281 Muscle weakness (generalized): Secondary | ICD-10-CM | POA: Diagnosis not present

## 2024-02-16 DIAGNOSIS — R2681 Unsteadiness on feet: Secondary | ICD-10-CM

## 2024-02-16 DIAGNOSIS — R293 Abnormal posture: Secondary | ICD-10-CM | POA: Diagnosis not present

## 2024-02-16 NOTE — Therapy (Signed)
 OUTPATIENT PHYSICAL THERAPY NEURO TREATMENT - DISCHARGE NOTE   Patient Name: April [name] MRN: [id number] DOB:05-23-1941, 83 y.o., female Today's Date: 02/16/2024   PCP: Sigmund Hazel, MD REFERRING PROVIDER: Shireen Quan, DO  PHYSICAL THERAPY DISCHARGE SUMMARY  Visits from Start of Care: 16  Current functional level related to goals / functional outcomes: Mod I   Remaining deficits: Ongoing impaired balance, significantly improved from initial eval with decreased frequency of falls   Education / Equipment: Handout for final HEP   Patient agrees to discharge. Patient goals were partially met. Patient is being discharged due to being pleased with the current functional level.    END OF SESSION:  PT End of Session - 02/16/24 0850     Visit Number 16    Number of Visits 18    Date for PT Re-Evaluation 02/29/24    Authorization Type [employer]    Progress Note Due on Visit 20    PT Start Time 0850   pt arrived late   PT Stop Time 0915   d/c   PT Time Calculation (min) 25 min    Equipment Utilized During Treatment Gait belt    Activity Tolerance Patient tolerated treatment well    Behavior During Therapy WFL for tasks assessed/performed               Past Medical History:  Diagnosis Date   Allergic rhinitis    Diabetes mellitus    TYPE 2   Dyslipidemia    Hyperkalemia    Hypertension    Insomnia    Pt stated no trouble falling or staying asleep.   RA (rheumatoid arthritis) (HCC)    Past Surgical History:  Procedure Laterality Date   BREAST CYST EXCISION Right 1967   HAND TENDON SURGERY     Right   OVARIAN CYST SURGERY Right 1988   Patient Active Problem List   Diagnosis Date Noted   Iron deficiency anemia 01/20/2019   Encounter for long-term (current) use of other medications 02/29/2012   Hypertension    Diabetes mellitus    Hypokalemia    RA (rheumatoid arthritis) (HCC)    EDEMA 08/27/2010   CARPAL TUNNEL SYNDROME, BILATERAL  10/28/2009   NUMBNESS 10/28/2009   Diabetes mellitus without complication (HCC) 09/20/2007   Hyperlipidemia 09/19/2007   Allergic rhinitis 09/19/2007   INSOMNIA 09/19/2007    ONSET DATE: 11/16/2023 referral  REFERRING DIAG: R29.6 (ICD-10-CM) - Falls frequently  THERAPY DIAG:  Muscle weakness (generalized)  Other abnormalities of gait and mobility  Unsteadiness on feet  Rationale for Evaluation and Treatment: Rehabilitation  SUBJECTIVE:  SUBJECTIVE STATEMENT: Pt reports she had to get behind her stove to unplug it to reset the time...flared up some pain in her shoulders from reaching. Pt denies any falls since last visit.  Pt reports she is impressed with her HEP, has noticed that with her walking she is more aware of her balance, better at walking her dog and keeping her balance.  Pt accompanied by: self  PERTINENT HISTORY: arthritis, DM, HTN, RA  PAIN:  Are you having pain? Yes: NPRS scale: 8/10 Pain location: whole body Pain description: achy  PRECAUTIONS: Fall  PATIENT GOALS: to not fall  OBJECTIVE:  Note: Objective measures were completed at Evaluation unless otherwise noted.  DIAGNOSTIC FINDINGS: none recent                                                                                                                               TREATMENT:  TherAct For LTG assessment:  [facility] PT Assessment - 02/16/24 0854       Ambulation/Gait   Gait velocity - backwards 32.8 ft over 10.59 sec = 3.1 ft/sec      Standardized Balance Assessment   Standardized Balance Assessment Five Times Sit to Stand    Five times sit to stand comments  22 sec   no UE     Functional Gait  Assessment   Gait assessed  Yes    Gait Level Surface Walks 20 ft in less than 7 sec but greater than 5.5 sec,  uses assistive device, slower speed, mild gait deviations, or deviates 6-10 in outside of the 12 in walkway width.    Change in Gait Speed Able to change speed, demonstrates mild gait deviations, deviates 6-10 in outside of the 12 in walkway width, or no gait deviations, unable to achieve a major change in velocity, or uses a change in velocity, or uses an assistive device.    Gait with Horizontal Head Turns Performs head turns smoothly with slight change in gait velocity (eg, minor disruption to smooth gait path), deviates 6-10 in outside 12 in walkway width, or uses an assistive device.    Gait with Vertical Head Turns Performs task with slight change in gait velocity (eg, minor disruption to smooth gait path), deviates 6 - 10 in outside 12 in walkway width or uses assistive device    Gait and Pivot Turn Pivot turns safely in greater than 3 sec and stops with no loss of balance, or pivot turns safely within 3 sec and stops with mild imbalance, requires small steps to catch balance.    Step Over Obstacle Is able to step over one shoe box (4.5 in total height) but must slow down and adjust steps to clear box safely. May require verbal cueing.    Gait with Narrow Base of Support Is able to ambulate for 10 steps heel to toe with no staggering.    Gait with Eyes Closed Walks 20 ft, slow speed, abnormal gait  pattern, evidence for imbalance, deviates 10-15 in outside 12 in walkway width. Requires more than 9 sec to ambulate 20 ft.    Ambulating Backwards Walks 20 ft, slow speed, abnormal gait pattern, evidence for imbalance, deviates 10-15 in outside 12 in walkway width.    Steps Alternating feet, must use rail.    Total Score 18    FGA comment: 18/30, high fall risk            mCTSIB: Condition 4: 26.3 sec avg of 3 trials    PATIENT EDUCATION: Education details: continue HEP, results of OM and functional implications Person educated: Patient Education method: Explanation Education  comprehension: verbalized understanding  HOME EXERCISE PROGRAM: Access Code: [email or web address] URL: [email or web address] Date: 12/26/2023 Prepared by: Peter Congo  Exercises - Romberg Stance Eyes Closed on Foam Pad  - 1 x daily - 7 x weekly - 1 sets - 3-5 reps - 30 sec hold - Romberg Stance on Foam Pad with Head Rotation  - 1 x daily - 7 x weekly - 1 sets - 3-5 reps - 30 sec hold - Romberg Stance with Head Nods on Foam Pad  - 1 x daily - 7 x weekly - 1 sets - 3-5 reps - 30 sec hold - Wide Tandem Stance on Foam Pad with Eyes Closed  - 1 x daily - 7 x weekly - 1 sets - 3-5 reps - 30 sec hold - Tandem Walking with Counter Support  - 1 x daily - 7 x weekly - 3 sets - 10 reps   GOALS: Goals reviewed with patient? Yes  SHORT TERM GOALS: Target date: 12/22/23  Pt will be independent with initial HEP for improved balance and functional strength  Baseline: to be provided Goal status: MET  2.  Pt will improve TUG to </= 13 secs to demonstrated reduced fall risk Baseline: 17.19s no AD, 12.47 sec no AD (1/16) Goal status: MET  3.  Patient will score >/= 52/56 on the BBS to demonstrate improved balance Baseline: 43/56, 53/56 (1/16) Goal status: MET  4.  Pt will improve gait speed to >/= .49m/s to demonstrate improved community ambulation Baseline: .58m/s, 0.79 m/s (1/16) Goal status: IN PROGRESS  5.  Pt will improve 5x STS to </= 25 sec to demo improved functional LE strength and balance  Baseline: 32.06s no UE, 22.18 sec hands on thighs  (1/16) Goal status: MET  6.  MCTSIB to be assessed and LTG added Baseline: to be assessed, assessed 12/31 - see LTG Goal status: MET   LONG TERM GOALS: Target date: 01/19/24  Pt will be independent with final HEP for improved balance and functional strength  Baseline: to be provided Goal status: IN PROGRESS  Pt will improve gait speed to >/= .33m/s to demonstrate improved community ambulation Baseline: .79m/s, 0.79 m/s (1/16), 0.68 m/s  (2/13) Goal status: NOT MET  Pt will improve 5x STS to </= 20 sec to demo improved functional LE strength and balance  Baseline: 32.06s no UE, 22.18 sec hands on thighs  (1/16), 32 sec hands on thighs (2/13) Goal status: IN PROGRESS  Pt will improve her score on Condition 4 of the mCTSIB to 30 seconds Baseline: 15 sec (12/31), 19 sec (2/13) Goal status: IN PROGRESS  NEW SHORT TERM GOALS=LONG TERM GOALS due to length of POC   NEW LONG TERM GOALS:  Target date: 02/16/2024  1.  Pt will improve FGA to >/= 16/30 to demonstrate improved balance and reduced fall risk Baseline: 16/30,  18/30 (3/14) Goal status: MET  2. Pt will be independent with final HEP for improved balance and functional strength  Baseline: to be provided Goal status: MET  3. Pt will improve gait speed to >/= .81m/s to demonstrate improved community ambulation Baseline: .12m/s, 0.79 m/s (1/16), 0.68 m/s (2/13), 0.94 m/s (3/14) Goal status: NOT MET  4. Pt will improve 5x STS to </= 20 sec to demo improved functional LE strength and balance  Baseline: 32.06s no UE, 22.18 sec hands on thighs  (1/16), 32 sec hands on thighs (2/13), 22 sec no UE (3/14) Goal status: NOT MET  Pt will improve her score on Condition 4 of the mCTSIB to 30 seconds Baseline: 15 sec (12/31), 19 sec (2/13), 26.3 sec (3/14) Goal status: NOT MET   ASSESSMENT:  CLINICAL IMPRESSION: Emphasis of skilled PT session on assessing LTG in preparation for d/c from OPPT services this date. Pt has met 2/5 LTG due to improving her FGA score to 18/30 and being independent with her final HEP. She did improve her gait speed, 5xSTS score, and balance on Condition 4 of the mCTSIB but did not improve enough to quite meet LTGs. Overall she does demonstrate improved balance and decreased fall risk with decrease in number of falls reported over the past few weeks. Pt agreeable to d/c from OPPT services this date and continue with her HEP.   OBJECTIVE IMPAIRMENTS:  Abnormal gait, cardiopulmonary status limiting activity, decreased balance, decreased endurance, decreased knowledge of condition, decreased knowledge of use of DME, difficulty walking, decreased strength, and pain.   ACTIVITY LIMITATIONS: carrying, lifting, squatting, stairs, locomotion level, and caring for others  PARTICIPATION LIMITATIONS: meal prep, cleaning, interpersonal relationship, driving, shopping, and community activity  PERSONAL FACTORS: Age, Past/current experiences, Time since onset of injury/illness/exacerbation, and 3+ comorbidities: see above  are also affecting patient's functional outcome.   REHAB POTENTIAL: Good  CLINICAL DECISION MAKING: Stable/uncomplicated  EVALUATION COMPLEXITY: Low     Peter Congo, PT Peter Congo, PT, DPT, CSRS  02/16/2024, 9:15 AM

## 2024-02-23 DIAGNOSIS — N289 Disorder of kidney and ureter, unspecified: Secondary | ICD-10-CM | POA: Diagnosis not present

## 2024-02-23 DIAGNOSIS — M150 Primary generalized (osteo)arthritis: Secondary | ICD-10-CM | POA: Diagnosis not present

## 2024-02-23 DIAGNOSIS — M0589 Other rheumatoid arthritis with rheumatoid factor of multiple sites: Secondary | ICD-10-CM | POA: Diagnosis not present

## 2024-02-23 DIAGNOSIS — Z79899 Other long term (current) drug therapy: Secondary | ICD-10-CM | POA: Diagnosis not present

## 2024-04-03 ENCOUNTER — Other Ambulatory Visit: Payer: Self-pay | Admitting: Endocrinology

## 2024-04-03 DIAGNOSIS — E119 Type 2 diabetes mellitus without complications: Secondary | ICD-10-CM

## 2024-04-08 DIAGNOSIS — E119 Type 2 diabetes mellitus without complications: Secondary | ICD-10-CM | POA: Diagnosis not present

## 2024-04-08 LAB — HM DIABETES EYE EXAM

## 2024-04-16 DIAGNOSIS — M47817 Spondylosis without myelopathy or radiculopathy, lumbosacral region: Secondary | ICD-10-CM | POA: Diagnosis not present

## 2024-04-16 DIAGNOSIS — M5412 Radiculopathy, cervical region: Secondary | ICD-10-CM | POA: Diagnosis not present

## 2024-04-16 DIAGNOSIS — T1490XD Injury, unspecified, subsequent encounter: Secondary | ICD-10-CM | POA: Diagnosis not present

## 2024-05-06 DIAGNOSIS — R059 Cough, unspecified: Secondary | ICD-10-CM | POA: Diagnosis not present

## 2024-05-09 ENCOUNTER — Telehealth: Payer: Self-pay

## 2024-05-09 NOTE — Telephone Encounter (Signed)
 Patient called stating she thought she missed a call from office. A message was left, however no encounters found since lat visit. Patient made aware.

## 2024-05-24 ENCOUNTER — Other Ambulatory Visit

## 2024-05-24 DIAGNOSIS — E118 Type 2 diabetes mellitus with unspecified complications: Secondary | ICD-10-CM | POA: Diagnosis not present

## 2024-05-25 LAB — LIPID PANEL
Cholesterol: 189 mg/dL (ref <200)
HDL: 89 mg/dL (ref >=50)
LDL Cholesterol (Calc): 88 mg/dL
Non-HDL Cholesterol (Calc): 100 mg/dL (ref <130)
Total CHOL/HDL Ratio: 2.1 (calc) (ref <5.0)
Triglycerides: 42 mg/dL (ref <150)

## 2024-05-25 LAB — BASIC METABOLIC PANEL WITH GFR
BUN/Creatinine Ratio: 20 (calc) (ref 6–22)
BUN: 24 mg/dL (ref 7–25)
CO2: 27 mmol/L (ref 20–32)
Calcium: 9.3 mg/dL (ref 8.6–10.4)
Chloride: 104 mmol/L (ref 98–110)
Creat: 1.19 mg/dL — ABNORMAL HIGH (ref 0.60–0.95)
Glucose, Bld: 109 mg/dL — ABNORMAL HIGH (ref 65–99)
Potassium: 4 mmol/L (ref 3.5–5.3)
Sodium: 139 mmol/L (ref 135–146)
eGFR: 46 mL/min/1.73m2 — ABNORMAL LOW (ref >=60)

## 2024-05-25 LAB — HEMOGLOBIN A1C
Hgb A1c MFr Bld: 6.1 % — ABNORMAL HIGH (ref <5.7)
Mean Plasma Glucose: 128 mg/dL
eAG (mmol/L): 7.1 mmol/L

## 2024-05-25 LAB — MICROALBUMIN / CREATININE URINE RATIO
Creatinine, Urine: 146 mg/dL (ref 20–275)
Microalb Creat Ratio: 26 mg/g{creat} (ref <30)
Microalb, Ur: 3.8 mg/dL

## 2024-05-27 ENCOUNTER — Ambulatory Visit: Payer: Self-pay | Admitting: Endocrinology

## 2024-05-27 DIAGNOSIS — M150 Primary generalized (osteo)arthritis: Secondary | ICD-10-CM | POA: Diagnosis not present

## 2024-05-27 DIAGNOSIS — M0589 Other rheumatoid arthritis with rheumatoid factor of multiple sites: Secondary | ICD-10-CM | POA: Diagnosis not present

## 2024-05-27 DIAGNOSIS — N289 Disorder of kidney and ureter, unspecified: Secondary | ICD-10-CM | POA: Diagnosis not present

## 2024-05-27 DIAGNOSIS — Z79899 Other long term (current) drug therapy: Secondary | ICD-10-CM | POA: Diagnosis not present

## 2024-05-30 ENCOUNTER — Encounter: Payer: Self-pay | Admitting: Endocrinology

## 2024-05-30 ENCOUNTER — Ambulatory Visit: Admitting: Endocrinology

## 2024-05-30 VITALS — BP 122/80 | HR 77 | Resp 20 | Ht 64.0 in | Wt 164.2 lb

## 2024-05-30 DIAGNOSIS — E7800 Pure hypercholesterolemia, unspecified: Secondary | ICD-10-CM

## 2024-05-30 DIAGNOSIS — E876 Hypokalemia: Secondary | ICD-10-CM

## 2024-05-30 DIAGNOSIS — Z7984 Long term (current) use of oral hypoglycemic drugs: Secondary | ICD-10-CM | POA: Diagnosis not present

## 2024-05-30 DIAGNOSIS — I10 Essential (primary) hypertension: Secondary | ICD-10-CM | POA: Diagnosis not present

## 2024-05-30 DIAGNOSIS — E118 Type 2 diabetes mellitus with unspecified complications: Secondary | ICD-10-CM | POA: Diagnosis not present

## 2024-05-30 NOTE — Progress Notes (Signed)
 Outpatient Endocrinology Note [place] Cynthya Yam, MD  05/30/24  Patient's Name: April [name]    DOB: [birth year]    MRN: [id number]                                                    REASON OF VISIT: Follow up of type 2 diabetes mellitus /hypertension/hypokalemia  PCP: Cleotilde Planas, MD  HISTORY OF PRESENT ILLNESS:   April [name] is a 83 y.o. old female with past medical history listed below, is here for follow up for type 2 diabetes mellitus /hypertension/hypokalemia.    Pertinent Diabetes History: Patient was previously seen by Dr. Von and was last seen in June 2024. Patient has type 2 diabetes mellitus since at least 2014.  She has well-controlled diabetes on metformin  500 mg 2 times a day.  Chronic Diabetes Complications : Retinopathy: no. Last ophthalmology exam was done on annually reportedly, following with ophthalmology regularly.  Nephropathy: CKD III Peripheral neuropathy:yes, on gabapentin . Coronary artery disease: no Stroke: mo  Relevant comorbidities and cardiovascular risk factors: Obesity: no Body mass index is 28.18 kg/m.  Hypertension: Yes  Hyperlipidemia : Yes, on statin   Current / Home Diabetic regimen includes: Metformin  XR 500 mg two times a day.   Prior diabetic medications: She had side effect with higher doses of regular metformin .  Glycemic data:   She has Accu-Chek guide glucometer.  Not able to download glucometer in the clinic today.   Hypoglycemia: Patient has no hypoglycemic episodes. Patient has hypoglycemia awareness, has a glucagon ER kit and diabetic alert device.   Factors modifying glucose control: 1.  Diabetic diet assessment: Eating vegetables and low-fat meals.  2.  Staying active or exercising: Cannot exercise due to joint pain.  3.  Medication compliance: compliant all of the time.  # Hypertension : -Diagnosed around 1979. Previously she was taking Avapro, clonidine 0.6 at bedtime and Dyazide but blood pressure was  relatively high with this regimen. For evaluation of her hyperaldosteronism she was switched to labetalol  100 mg twice a day, Tenex  2 mg and doxazosin . Evaluation for hyperaldosteronism was done, aldosterone level was 6.0 along with a relatively low renin level. With this regimen her blood pressure was much better She was subsequently started on Aldactone  to help with her severe hypokalemia and doxazosin  and guanfacine  were stopped. Previously also on HCTZ   CURRENT regimen of Aldactone  and labetalol    # Hypokalemia -Hypokalemia previously had been a significant longstanding problem and associated with hypertension. Previously had  been prescribed 6 tablets of potassium daily. She was off her diuretics and Avapro when her evaluation for hyperaldosteronism was done, aldosterone level was 6.0 along with a relatively low renin level. She has a normal potassium level previously with Aldactone , 50 mg daily along with 1 tablet of potassium 20 mEq daily.  Currently taking potassium chloride  10 Meq tablet taking half tablet daily.   Interval history  Hemoglobin A1c 6.1%.  Diabetes regimen as reviewed above.  Blood pressure is controlled.  Calcium is normal on recent labs.  Patient lab results reviewed with acceptable cholesterol levels.  Normal urine microalbumin creatinine ratio.  She has no complaints today.  REVIEW OF SYSTEMS As per history of present illness.   PAST MEDICAL HISTORY: Past Medical History:  Diagnosis Date   Allergic rhinitis    Diabetes mellitus  TYPE 2   Dyslipidemia    Hyperkalemia    Hypertension    Insomnia    Pt stated no trouble falling or staying asleep.   RA (rheumatoid arthritis) (HCC)     PAST SURGICAL HISTORY: Past Surgical History:  Procedure Laterality Date   BREAST CYST EXCISION Right 1967   HAND TENDON SURGERY     Right   OVARIAN CYST SURGERY Right 1988    ALLERGIES: Allergies  Allergen Reactions   Acetaminophen Nausea Only   Actos  [Pioglitazone Hydrochloride]     edema   Aspirin Nausea Only   Atorvastatin Other (See Comments)    Felt drunk/high floating   Latex     Other Reaction(s): INFECTION   Lisinopril     Other Reaction(s): dry throat/cough/st   Morphine Sulfate Other (See Comments)    Burns injecting arm is on fire   Naproxen Sodium Nausea Only   Sitagliptin Phosphate Nausea And Vomiting    FAMILY HISTORY:  Family History  Problem Relation Age of Onset   Diabetes Mother    Diabetes Father    Cancer Father        Prostate   Diabetes Sister    Stroke Maternal Grandmother        Cause of death   Healthy Brother     SOCIAL HISTORY: Social History   Socioeconomic History   Marital status: Married    Spouse name: Not on file   Number of children: Not on file   Years of education: Not on file   Highest education level: Not on file  Occupational History   Not on file  Tobacco Use   Smoking status: Never   Smokeless tobacco: Never  Vaping Use   Vaping status: Never Used  Substance and Sexual Activity   Alcohol use: No   Drug use: No   Sexual activity: Not on file  Other Topics Concern   Not on file  Social History Narrative   Not on file   Social Drivers of Health   Financial Resource Strain: Not on file  Food Insecurity: No Food Insecurity (03/10/2020)   Hunger Vital Sign    Worried About Running Out of Food in the Last Year: Never true    Ran Out of Food in the Last Year: Never true  Transportation Needs: No Transportation Needs (03/10/2020)   PRAPARE - Administrator, Civil Service (Medical): No    Lack of Transportation (Non-Medical): No  Physical Activity: Not on file  Stress: Not on file  Social Connections: Not on file    MEDICATIONS:  Current Outpatient Medications  Medication Sig Dispense Refill   Accu-Chek Softclix Lancets lancets TEST BLOOD GLUCOSE BEFORE MEALS AND AT BEDTIME 300 each 3   azithromycin (ZITHROMAX) 250 MG tablet Take by mouth.      benzonatate (TESSALON) 100 MG capsule Take 100 mg by mouth 3 (three) times daily as needed.     Blood Glucose Monitoring Suppl (ACCU-CHEK GUIDE) w/Device KIT Use to test blood sugar once daily 1 kit 0   Cetirizine HCl (ZYRTEC PO) 1 tab     Coenzyme Q10 (CO Q 10) 100 MG CAPS Take by mouth daily.     ezetimibe (ZETIA) 10 MG tablet Take 10 mg by mouth daily.     fluticasone (FLONASE) 50 MCG/ACT nasal spray Place 1 spray into both nostrils as needed for allergies.     folic acid  (FOLVITE ) 0.5 MG tablet Take 0.5 mg by mouth daily.  gabapentin  (NEURONTIN ) 100 MG capsule Take 100 mg by mouth 2 (two) times daily.     Ginger 500 MG CAPS Take 1 capsule by mouth daily.      glucose blood (ACCU-CHEK GUIDE TEST) test strip Use to test blood glucose before meals and at bedtime 300 each 12   glucose blood (ACCU-CHEK GUIDE) test strip Use to check blood sugar once a day 100 strip 2   labetalol  (NORMODYNE ) 100 MG tablet TAKE 0.5 TABLETS BY MOUTH 2 TIMES DAILY. 90 tablet 0   metFORMIN  (GLUCOPHAGE -XR) 500 MG 24 hr tablet Take 1 tablet (500 mg total) by mouth 2 (two) times daily with a meal. 180 tablet 2   montelukast (SINGULAIR) 10 MG tablet Take 10 mg by mouth daily.      potassium chloride  SA (KLOR-CON  M20) 20 MEQ tablet TAKE 1/2 TABLET BY MOUTH EVERY DAY 45 tablet 1   pravastatin  (PRAVACHOL ) 80 MG tablet Take 1 tablet (80 mg total) by mouth daily. 90 tablet 1   spironolactone  (ALDACTONE ) 100 MG tablet Take 1 tablet (100 mg total) by mouth daily. 90 tablet 3   tapentadol (NUCYNTA) 50 MG tablet Take 50 mg by mouth as needed for severe pain (pain score 7-10) (spasms).     Turmeric 450 MG CAPS Take 1 capsule by mouth daily.      vitamin B-12 (CYANOCOBALAMIN ) 1000 MCG tablet Take 1,000 mcg by mouth daily.     No current facility-administered medications for this visit.    PHYSICAL EXAM: Vitals:   05/30/24 0829  BP: 122/80  Pulse: 77  Resp: 20  SpO2: 97%  Weight: 164 lb 3.2 oz (74.5 kg)  Height: 5' 4  (1.626 m)     Body mass index is 28.18 kg/m.  Wt Readings from Last 3 Encounters:  05/30/24 164 lb 3.2 oz (74.5 kg)  02/05/24 160 lb (72.6 kg)  01/17/24 162 lb 6.4 oz (73.7 kg)    General: Well developed, well nourished female in no apparent distress.  HEENT: AT/, no external lesions.  Eyes: Conjunctiva clear and no icterus. Neck: Neck supple  Lungs: Respirations not labored Neurologic: Alert, oriented, normal speech Extremities / Skin: Dry.  Psychiatric: Does not appear depressed or anxious  Diabetic Foot Exam - Simple   No data filed      LABS Reviewed Lab Results  Component Value Date   HGBA1C 6.1 (H) 05/24/2024   HGBA1C 5.8 (A) 02/05/2024   HGBA1C 5.8 10/03/2023   Lab Results  Component Value Date   FRUCTOSAMINE 235 06/29/2020   FRUCTOSAMINE 211 02/24/2020   Lab Results  Component Value Date   CHOL 189 05/24/2024   HDL 89 05/24/2024   LDLCALC 88 05/24/2024   LDLDIRECT 85.0 10/09/2019   TRIG 42 05/24/2024   CHOLHDL 2.1 05/24/2024   Lab Results  Component Value Date   MICRALBCREAT 26 05/24/2024   MICRALBCREAT 2.0 05/12/2023   Lab Results  Component Value Date   CREATININE 1.19 (H) 05/24/2024   Lab Results  Component Value Date   GFR 58.12 (L) 10/03/2023   GFR 58.12 (L) 10/03/2023    Latest Reference Range & Units 07/28/14 09:31  ALDOSTERONE ng/dL 6  PRA LC/MS/MS 9.74 - 5.82 ng/mL/h 0.21 (L)  ALDO / PRA Ratio 0.9 - 28.9 Ratio 28.6  (L): Data is abnormally low  ASSESSMENT / PLAN  1. Controlled type 2 diabetes mellitus with complication, without long-term current use of insulin (HCC)   2. Essential hypertension   3. Hypokalemia  4. Pure hypercholesterolemia       Diabetes Mellitus type 2, complicated by CKD /neuropathy. - Diabetic status / severity: Controlled.  Lab Results  Component Value Date   HGBA1C 6.1 (H) 05/24/2024    - Hemoglobin A1c goal : <7%  - Medications: No change.  I) continue metformin  extended release 500 mg  2 times a day.  - Home glucose testing: Few times a week in the morning fasting. - Discussed/ Gave Hypoglycemia treatment plan.  # Consult : not required at this time.   # Annual urine for microalbuminuria/ creatinine ratio, no microalbuminuria currently.  She has CKD 3.  Last  Lab Results  Component Value Date   MICRALBCREAT 26 05/24/2024    # Foot check nightly / neuropathy, continue gabapentin .  # Annual dilated diabetic eye exams.   - Diet: Make healthy diabetic food choices - Life style / activity / exercise: Discussed.  2. Blood pressure  -  BP Readings from Last 1 Encounters:  05/30/24 122/80    - Control is in target.  - No change in current plans.  3. Lipid status / Hyperlipidemia - Last  Lab Results  Component Value Date   LDLCALC 88 05/24/2024   - Continue pravastatin  80 mg daily.  Zetia 10 mg daily.  # Hypertension -Blood pressure controlled. -Continue current dose of spironolactone /Aldactone  100 mg daily. -Continue current dose of labetalol  50 mg 2 times a day.  # Hypokalemia -Continue current dose of potassium chloride  20 mEq half tablet daily.  Hypertension and hypokalemia comanaged with primary care provider.  Diagnoses and all orders for this visit:  Controlled type 2 diabetes mellitus with complication, without long-term current use of insulin (HCC)  Essential hypertension  Hypokalemia  Pure hypercholesterolemia    DISPOSITION Follow up in clinic in 5 months suggested.  Labs on the same day of the visit.  All questions answered and patient verbalized understanding of the plan.  Ira[place] TRUE Shackleford, MD [facility] Endocrinology [facility] [address] [place], [address] [place], [place] [address] Phone # [phone]  At least part of this note was generated using voice recognition software. Inadvertent word errors may have occurred, which were not recognized during the proofreading process.

## 2024-06-20 ENCOUNTER — Other Ambulatory Visit: Payer: Self-pay | Admitting: Endocrinology

## 2024-06-20 DIAGNOSIS — E7800 Pure hypercholesterolemia, unspecified: Secondary | ICD-10-CM

## 2024-06-20 DIAGNOSIS — E876 Hypokalemia: Secondary | ICD-10-CM

## 2024-06-20 NOTE — Telephone Encounter (Signed)
 Refill request complete

## 2024-06-24 DIAGNOSIS — M8588 Other specified disorders of bone density and structure, other site: Secondary | ICD-10-CM | POA: Diagnosis not present

## 2024-06-24 DIAGNOSIS — E2839 Other primary ovarian failure: Secondary | ICD-10-CM | POA: Diagnosis not present

## 2024-06-24 DIAGNOSIS — N958 Other specified menopausal and perimenopausal disorders: Secondary | ICD-10-CM | POA: Diagnosis not present

## 2024-07-04 DIAGNOSIS — J309 Allergic rhinitis, unspecified: Secondary | ICD-10-CM | POA: Diagnosis not present

## 2024-07-04 DIAGNOSIS — Z6828 Body mass index (BMI) 28.0-28.9, adult: Secondary | ICD-10-CM | POA: Diagnosis not present

## 2024-07-04 DIAGNOSIS — G4762 Sleep related leg cramps: Secondary | ICD-10-CM | POA: Diagnosis not present

## 2024-07-04 DIAGNOSIS — M8580 Other specified disorders of bone density and structure, unspecified site: Secondary | ICD-10-CM | POA: Diagnosis not present

## 2024-07-04 DIAGNOSIS — M069 Rheumatoid arthritis, unspecified: Secondary | ICD-10-CM | POA: Diagnosis not present

## 2024-07-04 DIAGNOSIS — E7800 Pure hypercholesterolemia, unspecified: Secondary | ICD-10-CM | POA: Diagnosis not present

## 2024-07-04 DIAGNOSIS — E1122 Type 2 diabetes mellitus with diabetic chronic kidney disease: Secondary | ICD-10-CM | POA: Diagnosis not present

## 2024-07-04 DIAGNOSIS — N1831 Chronic kidney disease, stage 3a: Secondary | ICD-10-CM | POA: Diagnosis not present

## 2024-07-04 DIAGNOSIS — I129 Hypertensive chronic kidney disease with stage 1 through stage 4 chronic kidney disease, or unspecified chronic kidney disease: Secondary | ICD-10-CM | POA: Diagnosis not present

## 2024-07-17 DIAGNOSIS — M5416 Radiculopathy, lumbar region: Secondary | ICD-10-CM | POA: Diagnosis not present

## 2024-09-16 DIAGNOSIS — Z79899 Other long term (current) drug therapy: Secondary | ICD-10-CM | POA: Diagnosis not present

## 2024-09-16 DIAGNOSIS — M8580 Other specified disorders of bone density and structure, unspecified site: Secondary | ICD-10-CM | POA: Diagnosis not present

## 2024-09-16 DIAGNOSIS — M150 Primary generalized (osteo)arthritis: Secondary | ICD-10-CM | POA: Diagnosis not present

## 2024-09-16 DIAGNOSIS — M0589 Other rheumatoid arthritis with rheumatoid factor of multiple sites: Secondary | ICD-10-CM | POA: Diagnosis not present

## 2024-09-20 DIAGNOSIS — I10 Essential (primary) hypertension: Secondary | ICD-10-CM | POA: Diagnosis not present

## 2024-09-20 DIAGNOSIS — Z5982 Transportation insecurity: Secondary | ICD-10-CM | POA: Diagnosis not present

## 2024-09-20 DIAGNOSIS — Z556 Problems related to health literacy: Secondary | ICD-10-CM | POA: Diagnosis not present

## 2024-09-20 DIAGNOSIS — E1140 Type 2 diabetes mellitus with diabetic neuropathy, unspecified: Secondary | ICD-10-CM | POA: Diagnosis not present

## 2024-09-20 DIAGNOSIS — M150 Primary generalized (osteo)arthritis: Secondary | ICD-10-CM | POA: Diagnosis not present

## 2024-09-20 DIAGNOSIS — E7800 Pure hypercholesterolemia, unspecified: Secondary | ICD-10-CM | POA: Diagnosis not present

## 2024-09-20 DIAGNOSIS — M8580 Other specified disorders of bone density and structure, unspecified site: Secondary | ICD-10-CM | POA: Diagnosis not present

## 2024-09-20 DIAGNOSIS — Z7984 Long term (current) use of oral hypoglycemic drugs: Secondary | ICD-10-CM | POA: Diagnosis not present

## 2024-09-27 DIAGNOSIS — M0589 Other rheumatoid arthritis with rheumatoid factor of multiple sites: Secondary | ICD-10-CM | POA: Diagnosis not present

## 2024-09-27 DIAGNOSIS — E1140 Type 2 diabetes mellitus with diabetic neuropathy, unspecified: Secondary | ICD-10-CM | POA: Diagnosis not present

## 2024-09-27 DIAGNOSIS — M8580 Other specified disorders of bone density and structure, unspecified site: Secondary | ICD-10-CM | POA: Diagnosis not present

## 2024-09-27 DIAGNOSIS — M150 Primary generalized (osteo)arthritis: Secondary | ICD-10-CM | POA: Diagnosis not present

## 2024-09-27 DIAGNOSIS — Z5982 Transportation insecurity: Secondary | ICD-10-CM | POA: Diagnosis not present

## 2024-09-27 DIAGNOSIS — Z7984 Long term (current) use of oral hypoglycemic drugs: Secondary | ICD-10-CM | POA: Diagnosis not present

## 2024-09-27 DIAGNOSIS — E7800 Pure hypercholesterolemia, unspecified: Secondary | ICD-10-CM | POA: Diagnosis not present

## 2024-09-27 DIAGNOSIS — I10 Essential (primary) hypertension: Secondary | ICD-10-CM | POA: Diagnosis not present

## 2024-09-27 DIAGNOSIS — Z556 Problems related to health literacy: Secondary | ICD-10-CM | POA: Diagnosis not present

## 2024-10-08 DIAGNOSIS — E1140 Type 2 diabetes mellitus with diabetic neuropathy, unspecified: Secondary | ICD-10-CM | POA: Diagnosis not present

## 2024-10-13 DIAGNOSIS — Z833 Family history of diabetes mellitus: Secondary | ICD-10-CM | POA: Diagnosis not present

## 2024-10-13 DIAGNOSIS — I700 Atherosclerosis of aorta: Secondary | ICD-10-CM | POA: Diagnosis not present

## 2024-10-13 DIAGNOSIS — N1831 Chronic kidney disease, stage 3a: Secondary | ICD-10-CM | POA: Diagnosis not present

## 2024-10-13 DIAGNOSIS — E876 Hypokalemia: Secondary | ICD-10-CM | POA: Diagnosis not present

## 2024-10-13 DIAGNOSIS — Z9849 Cataract extraction status, unspecified eye: Secondary | ICD-10-CM | POA: Diagnosis not present

## 2024-10-13 DIAGNOSIS — M069 Rheumatoid arthritis, unspecified: Secondary | ICD-10-CM | POA: Diagnosis not present

## 2024-10-13 DIAGNOSIS — E785 Hyperlipidemia, unspecified: Secondary | ICD-10-CM | POA: Diagnosis not present

## 2024-10-13 DIAGNOSIS — J45909 Unspecified asthma, uncomplicated: Secondary | ICD-10-CM | POA: Diagnosis not present

## 2024-10-13 DIAGNOSIS — E1140 Type 2 diabetes mellitus with diabetic neuropathy, unspecified: Secondary | ICD-10-CM | POA: Diagnosis not present

## 2024-10-13 DIAGNOSIS — Z8249 Family history of ischemic heart disease and other diseases of the circulatory system: Secondary | ICD-10-CM | POA: Diagnosis not present

## 2024-10-13 DIAGNOSIS — I129 Hypertensive chronic kidney disease with stage 1 through stage 4 chronic kidney disease, or unspecified chronic kidney disease: Secondary | ICD-10-CM | POA: Diagnosis not present

## 2024-10-13 DIAGNOSIS — E1151 Type 2 diabetes mellitus with diabetic peripheral angiopathy without gangrene: Secondary | ICD-10-CM | POA: Diagnosis not present

## 2024-10-13 DIAGNOSIS — Z79622 Long term (current) use of janus kinase inhibitor: Secondary | ICD-10-CM | POA: Diagnosis not present

## 2024-10-13 DIAGNOSIS — E1122 Type 2 diabetes mellitus with diabetic chronic kidney disease: Secondary | ICD-10-CM | POA: Diagnosis not present

## 2024-10-13 DIAGNOSIS — D529 Folate deficiency anemia, unspecified: Secondary | ICD-10-CM | POA: Diagnosis not present

## 2024-10-16 ENCOUNTER — Other Ambulatory Visit: Payer: Self-pay

## 2024-10-16 DIAGNOSIS — I10 Essential (primary) hypertension: Secondary | ICD-10-CM

## 2024-10-16 DIAGNOSIS — E118 Type 2 diabetes mellitus with unspecified complications: Secondary | ICD-10-CM

## 2024-10-16 MED ORDER — LABETALOL HCL 100 MG PO TABS: ORAL_TABLET | ORAL | 0 refills | Status: DC

## 2024-10-21 ENCOUNTER — Ambulatory Visit: Admitting: Endocrinology

## 2024-10-21 ENCOUNTER — Encounter: Payer: Self-pay | Admitting: Endocrinology

## 2024-10-21 ENCOUNTER — Ambulatory Visit: Payer: Self-pay | Admitting: Endocrinology

## 2024-10-21 VITALS — BP 122/60 | HR 69 | Resp 16 | Ht 64.0 in | Wt 161.4 lb

## 2024-10-21 DIAGNOSIS — I10 Essential (primary) hypertension: Secondary | ICD-10-CM | POA: Diagnosis not present

## 2024-10-21 DIAGNOSIS — E119 Type 2 diabetes mellitus without complications: Secondary | ICD-10-CM

## 2024-10-21 DIAGNOSIS — E7800 Pure hypercholesterolemia, unspecified: Secondary | ICD-10-CM | POA: Diagnosis not present

## 2024-10-21 DIAGNOSIS — E876 Hypokalemia: Secondary | ICD-10-CM | POA: Diagnosis not present

## 2024-10-21 DIAGNOSIS — E1122 Type 2 diabetes mellitus with diabetic chronic kidney disease: Secondary | ICD-10-CM | POA: Diagnosis not present

## 2024-10-21 DIAGNOSIS — N189 Chronic kidney disease, unspecified: Secondary | ICD-10-CM | POA: Diagnosis not present

## 2024-10-21 DIAGNOSIS — Z7984 Long term (current) use of oral hypoglycemic drugs: Secondary | ICD-10-CM | POA: Diagnosis not present

## 2024-10-21 DIAGNOSIS — E118 Type 2 diabetes mellitus with unspecified complications: Secondary | ICD-10-CM

## 2024-10-21 DIAGNOSIS — E1140 Type 2 diabetes mellitus with diabetic neuropathy, unspecified: Secondary | ICD-10-CM

## 2024-10-21 LAB — POCT GLYCOSYLATED HEMOGLOBIN (HGB A1C): Hemoglobin A1C: 5.9 % — AB (ref 4.0–5.6)

## 2024-10-21 MED ORDER — METFORMIN HCL ER 500 MG PO TB24: 500.0000 mg | ORAL_TABLET | Freq: Two times a day (BID) | ORAL | 3 refills | Status: AC

## 2024-10-21 NOTE — Progress Notes (Signed)
 Outpatient Endocrinology Note Basha Krygier, MD  10/21/24  Patient's Name: April [name]    DOB: 08-Feb-1941    MRN: [id number]                                                    REASON OF VISIT: Follow up of type 2 diabetes mellitus /hypertension/hypokalemia  PCP: Cleotilde Planas, MD  HISTORY OF PRESENT ILLNESS:   April [name] is a 83 y.o. old female with past medical history listed below, is here for follow up for type 2 diabetes mellitus /hypertension/hypokalemia.    Pertinent Diabetes History: Patient was previously seen by Dr. Von and was last seen in June 2024. Patient has type 2 diabetes mellitus since at least 2014.  She has well-controlled diabetes on metformin  500 mg 2 times a day.  Chronic Diabetes Complications : Retinopathy: no. Last ophthalmology exam was done on annually reportedly 04/2024, following with ophthalmology regularly.  Nephropathy: CKD III Peripheral neuropathy:yes, on gabapentin . Coronary artery disease: no Stroke: mo  Relevant comorbidities and cardiovascular risk factors: Obesity: no Body mass index is 27.7 kg/m.  Hypertension: Yes  Hyperlipidemia : Yes, on statin   Current / Home Diabetic regimen includes: Metformin  XR 500 mg two times a day.   Prior diabetic medications: She had side effect with higher doses of regular metformin .  Glycemic data:   She has Accu-Chek guide glucometer.  She forgot to bring glucometer in the clinic today.   Hypoglycemia: Patient has no hypoglycemic episodes. Patient has hypoglycemia awareness, has a glucagon ER kit and diabetic alert device.   Factors modifying glucose control: 1.  Diabetic diet assessment: Eating vegetables and low-fat meals.  2.  Staying active or exercising: Cannot exercise due to joint pain.  3.  Medication compliance: compliant all of the time.  # Hypertension : -Diagnosed around 1979. Previously she was taking Avapro, clonidine 0.6 at bedtime and Dyazide but blood  pressure was relatively high with this regimen. For evaluation of her hyperaldosteronism she was switched to labetalol  100 mg twice a day, Tenex  2 mg and doxazosin . Evaluation for hyperaldosteronism was done, aldosterone level was 6.0 along with a relatively low renin level. With this regimen her blood pressure was much better She was subsequently started on Aldactone  to help with her severe hypokalemia and doxazosin  and guanfacine  were stopped. Previously also on HCTZ   CURRENT regimen of Aldactone  and labetalol    # Hypokalemia -Hypokalemia previously had been a significant longstanding problem and associated with hypertension. Previously had  been prescribed 6 tablets of potassium daily. She was off her diuretics and Avapro when her evaluation for hyperaldosteronism was done, aldosterone level was 6.0 along with a relatively low renin level. She has a normal potassium level previously with Aldactone , 50 mg daily along with 1 tablet of potassium 20 mEq daily.  Currently taking potassium chloride  10 Meq tablet taking half tablet daily.   Interval history  Hemoglobin A1c 5.9%.  She has been taking metformin  2 times a day.  Blood pressure is controlled.  She denies numbness and tingling of the feet.  No vision problem.  No other complaints today.  REVIEW OF SYSTEMS As per history of present illness.   PAST MEDICAL HISTORY: Past Medical History:  Diagnosis Date   Allergic rhinitis    Diabetes mellitus    TYPE 2  Dyslipidemia    Hyperkalemia    Hypertension    Insomnia    Pt stated no trouble falling or staying asleep.   RA (rheumatoid arthritis) (HCC)     PAST SURGICAL HISTORY: Past Surgical History:  Procedure Laterality Date   BREAST CYST EXCISION Right 1967   HAND TENDON SURGERY     Right   OVARIAN CYST SURGERY Right 1988    ALLERGIES: Allergies  Allergen Reactions   Acetaminophen Nausea Only   Actos [Pioglitazone Hydrochloride]     edema   Aspirin Nausea Only    Atorvastatin Other (See Comments)    Felt drunk/high floating   Latex     Other Reaction(s): INFECTION   Lisinopril     Other Reaction(s): dry throat/cough/st   Morphine Sulfate Other (See Comments)    Burns injecting arm is on fire   Naproxen Sodium Nausea Only   Sitagliptin Phosphate Nausea And Vomiting    FAMILY HISTORY:  Family History  Problem Relation Age of Onset   Diabetes Mother    Diabetes Father    Cancer Father        Prostate   Diabetes Sister    Stroke Maternal Grandmother        Cause of death   Healthy Brother     SOCIAL HISTORY: Social History   Socioeconomic History   Marital status: Married    Spouse name: Not on file   Number of children: Not on file   Years of education: Not on file   Highest education level: Not on file  Occupational History   Not on file  Tobacco Use   Smoking status: Never   Smokeless tobacco: Never  Vaping Use   Vaping status: Never Used  Substance and Sexual Activity   Alcohol use: No   Drug use: No   Sexual activity: Not on file  Other Topics Concern   Not on file  Social History Narrative   Not on file   Social Drivers of Health   Financial Resource Strain: Not on file  Food Insecurity: No Food Insecurity (03/10/2020)   Hunger Vital Sign    Worried About Running Out of Food in the Last Year: Never true    Ran Out of Food in the Last Year: Never true  Transportation Needs: No Transportation Needs (03/10/2020)   PRAPARE - Administrator, Civil Service (Medical): No    Lack of Transportation (Non-Medical): No  Physical Activity: Not on file  Stress: Not on file  Social Connections: Not on file    MEDICATIONS:  Current Outpatient Medications  Medication Sig Dispense Refill   Accu-Chek Softclix Lancets lancets TEST BLOOD GLUCOSE BEFORE MEALS AND AT BEDTIME 300 each 3   azithromycin (ZITHROMAX) 250 MG tablet Take by mouth.     benzonatate (TESSALON) 100 MG capsule Take 100 mg by mouth 3 (three)  times daily as needed.     Blood Glucose Monitoring Suppl (ACCU-CHEK GUIDE) w/Device KIT Use to test blood sugar once daily 1 kit 0   Cetirizine HCl (ZYRTEC PO) 1 tab     Coenzyme Q10 (CO Q 10) 100 MG CAPS Take by mouth daily.     ezetimibe (ZETIA) 10 MG tablet Take 10 mg by mouth daily.     fluticasone (FLONASE) 50 MCG/ACT nasal spray Place 1 spray into both nostrils as needed for allergies.     folic acid  (FOLVITE ) 0.5 MG tablet Take 0.5 mg by mouth daily.  gabapentin  (NEURONTIN ) 100 MG capsule Take 100 mg by mouth 2 (two) times daily.     Ginger 500 MG CAPS Take 1 capsule by mouth daily.      glucose blood (ACCU-CHEK GUIDE TEST) test strip Use to test blood glucose before meals and at bedtime 300 each 12   glucose blood (ACCU-CHEK GUIDE) test strip Use to check blood sugar once a day 100 strip 2   labetalol  (NORMODYNE ) 100 MG tablet TAKE 0.5 TABLETS BY MOUTH 2 TIMES DAILY. 90 tablet 0   montelukast (SINGULAIR) 10 MG tablet Take 10 mg by mouth daily.      potassium chloride  SA (KLOR-CON  M) 20 MEQ tablet TAKE 1/2 TABLET EVERY DAY 45 tablet 3   pravastatin  (PRAVACHOL ) 80 MG tablet TAKE 1 TABLET EVERY DAY 90 tablet 3   spironolactone  (ALDACTONE ) 25 MG tablet 1/2 tablet Orally once daily     tapentadol (NUCYNTA) 50 MG tablet Take 50 mg by mouth as needed for severe pain (pain score 7-10) (spasms).     Turmeric 450 MG CAPS Take 1 capsule by mouth daily.      vitamin B-12 (CYANOCOBALAMIN ) 1000 MCG tablet Take 1,000 mcg by mouth daily.     metFORMIN  (GLUCOPHAGE -XR) 500 MG 24 hr tablet Take 1 tablet (500 mg total) by mouth 2 (two) times daily with a meal. 180 tablet 3   No current facility-administered medications for this visit.    PHYSICAL EXAM: Vitals:   10/21/24 0825  BP: 122/60  Pulse: 69  Resp: 16  SpO2: 97%  Weight: 161 lb 6.4 oz (73.2 kg)  Height: 5' 4 (1.626 m)     Body mass index is 27.7 kg/m.  Wt Readings from Last 3 Encounters:  10/21/24 161 lb 6.4 oz (73.2 kg)   05/30/24 164 lb 3.2 oz (74.5 kg)  02/05/24 160 lb (72.6 kg)    General: Well developed, well nourished female in no apparent distress.  HEENT: AT/[place], no external lesions.  Eyes: Conjunctiva clear and no icterus. Neck: Neck supple  Lungs: Respirations not labored Neurologic: Alert, oriented, normal speech Extremities / Skin: Dry.  Psychiatric: Does not appear depressed or anxious  Diabetic Foot Exam - Simple   No data filed      LABS Reviewed Lab Results  Component Value Date   HGBA1C 5.9 (A) 10/21/2024   HGBA1C 6.1 (H) 05/24/2024   HGBA1C 5.8 (A) 02/05/2024   Lab Results  Component Value Date   FRUCTOSAMINE 235 06/29/2020   FRUCTOSAMINE 211 02/24/2020   Lab Results  Component Value Date   CHOL 189 05/24/2024   HDL 89 05/24/2024   LDLCALC 88 05/24/2024   LDLDIRECT 85.0 10/09/2019   TRIG 42 05/24/2024   CHOLHDL 2.1 05/24/2024   Lab Results  Component Value Date   MICRALBCREAT 26 05/24/2024   MICRALBCREAT 1.2 06/24/2010   Lab Results  Component Value Date   CREATININE 1.19 (H) 05/24/2024   Lab Results  Component Value Date   GFR 58.12 (L) 10/03/2023   GFR 58.12 (L) 10/03/2023    Latest Reference Range & Units 07/28/14 09:31  ALDOSTERONE ng/dL 6  PRA LC/MS/MS 9.74 - 5.82 ng/mL/h 0.21 (L)  ALDO / PRA Ratio 0.9 - 28.9 Ratio 28.6  (L): Data is abnormally low  ASSESSMENT / PLAN  1. Controlled diabetes mellitus type 2 with complications (HCC)   2. Diabetes mellitus, stable (HCC)   3. Pure hypercholesterolemia   4. Essential hypertension   5. Hypokalemia     Diabetes Mellitus  type 2, complicated by CKD /neuropathy. - Diabetic status / severity: Controlled.  Lab Results  Component Value Date   HGBA1C 5.9 (A) 10/21/2024    - Hemoglobin A1c goal : <7%  - Medications: No change.  I) continue metformin  extended release 500 mg 2 times a day.  - Home glucose testing: Few times a week in the morning fasting.  - Discussed/ Gave Hypoglycemia  treatment plan.  # Consult : not required at this time.   # Annual urine for microalbuminuria/ creatinine ratio, no microalbuminuria currently.  She has CKD 3.  Last  Lab Results  Component Value Date   MICRALBCREAT 26 05/24/2024    # Foot check nightly / neuropathy, continue gabapentin .  # Annual dilated diabetic eye exams.   - Diet: Make healthy diabetic food choices - Life style / activity / exercise: Discussed.  2. Blood pressure  -  BP Readings from Last 1 Encounters:  10/21/24 122/60    - Control is in target.  - No change in current plans.  3. Lipid status / Hyperlipidemia - Last  Lab Results  Component Value Date   LDLCALC 88 05/24/2024   - Continue pravastatin  80 mg daily.  Zetia 10 mg daily.  # Hypertension -Blood pressure controlled. -Continue current dose of spironolactone /Aldactone  1/2 of 25 mg daily. -Continue current dose of labetalol  50 mg 2 times a day.  # Hypokalemia -Continue current dose of potassium chloride  20 mEq half tablet daily.  Hypertension and hypokalemia comanaged with primary care provider.  Diagnoses and all orders for this visit:  Controlled diabetes mellitus type 2 with complications (HCC) -     POCT glycosylated hemoglobin (Hb A1C) -     Basic metabolic panel with GFR -     Lipid panel -     Hemoglobin A1c -     Microalbumin / creatinine urine ratio  Diabetes mellitus, stable (HCC) -     metFORMIN  (GLUCOPHAGE -XR) 500 MG 24 hr tablet; Take 1 tablet (500 mg total) by mouth 2 (two) times daily with a meal.  Pure hypercholesterolemia -     Lipid panel  Essential hypertension  Hypokalemia    DISPOSITION Follow up in clinic in 5 months suggested.  Labs prior to follow-up visit as ordered.  All questions answered and patient verbalized understanding of the plan.  Tiphani Mells, MD [facility] Endocrinology [facility] [address] [place], [address] [place], [place] [address] Phone # [phone]  At least part  of this note was generated using voice recognition software. Inadvertent word errors may have occurred, which were not recognized during the proofreading process.

## 2024-11-09 ENCOUNTER — Other Ambulatory Visit: Payer: Self-pay | Admitting: Endocrinology

## 2024-11-09 DIAGNOSIS — E119 Type 2 diabetes mellitus without complications: Secondary | ICD-10-CM

## 2024-11-12 DIAGNOSIS — Z1231 Encounter for screening mammogram for malignant neoplasm of breast: Secondary | ICD-10-CM | POA: Diagnosis not present

## 2025-01-01 ENCOUNTER — Other Ambulatory Visit: Payer: Self-pay | Admitting: Endocrinology

## 2025-01-01 DIAGNOSIS — E118 Type 2 diabetes mellitus with unspecified complications: Secondary | ICD-10-CM

## 2025-01-01 DIAGNOSIS — I10 Essential (primary) hypertension: Secondary | ICD-10-CM

## 2025-04-16 ENCOUNTER — Other Ambulatory Visit

## 2025-04-21 ENCOUNTER — Ambulatory Visit: Admitting: Endocrinology
